# Patient Record
Sex: Female | Born: 1950
Health system: Southern US, Community
[De-identification: ages and names within clinical notes are randomized; demographics above are authoritative.]

## PROBLEM LIST (undated history)

## (undated) DIAGNOSIS — M7500 Adhesive capsulitis of unspecified shoulder: Secondary | ICD-10-CM

## (undated) DIAGNOSIS — Z95810 Presence of automatic (implantable) cardiac defibrillator: Secondary | ICD-10-CM

## (undated) DIAGNOSIS — R011 Cardiac murmur, unspecified: Secondary | ICD-10-CM

## (undated) DIAGNOSIS — F32A Depression, unspecified: Secondary | ICD-10-CM

## (undated) DIAGNOSIS — Z9889 Other specified postprocedural states: Secondary | ICD-10-CM

## (undated) DIAGNOSIS — F419 Anxiety disorder, unspecified: Secondary | ICD-10-CM

## (undated) DIAGNOSIS — J449 Chronic obstructive pulmonary disease, unspecified: Secondary | ICD-10-CM

## (undated) DIAGNOSIS — Z8679 Personal history of other diseases of the circulatory system: Secondary | ICD-10-CM

## (undated) DIAGNOSIS — I4892 Unspecified atrial flutter: Secondary | ICD-10-CM

## (undated) DIAGNOSIS — I4891 Unspecified atrial fibrillation: Secondary | ICD-10-CM

## (undated) DIAGNOSIS — T7840XA Allergy, unspecified, initial encounter: Secondary | ICD-10-CM

## (undated) DIAGNOSIS — K219 Gastro-esophageal reflux disease without esophagitis: Secondary | ICD-10-CM

## (undated) DIAGNOSIS — E7800 Pure hypercholesterolemia, unspecified: Secondary | ICD-10-CM

## (undated) DIAGNOSIS — J45909 Unspecified asthma, uncomplicated: Secondary | ICD-10-CM

## (undated) DIAGNOSIS — M1990 Unspecified osteoarthritis, unspecified site: Secondary | ICD-10-CM

## (undated) DIAGNOSIS — H409 Unspecified glaucoma: Secondary | ICD-10-CM

## (undated) DIAGNOSIS — H269 Unspecified cataract: Secondary | ICD-10-CM

## (undated) DIAGNOSIS — F329 Major depressive disorder, single episode, unspecified: Secondary | ICD-10-CM

## (undated) DIAGNOSIS — U071 COVID-19: Secondary | ICD-10-CM

## (undated) DIAGNOSIS — R112 Nausea with vomiting, unspecified: Secondary | ICD-10-CM

## (undated) HISTORY — DX: Allergy, unspecified, initial encounter: T78.40XA

## (undated) HISTORY — DX: Major depressive disorder, single episode, unspecified: F32.9

## (undated) HISTORY — PX: COSMETIC SURGERY: SHX468

## (undated) HISTORY — PX: PERIPHERAL VASCULAR INTERVENTION: CATH118257

## (undated) HISTORY — DX: Unspecified cataract: H26.9

## (undated) HISTORY — DX: Unspecified osteoarthritis, unspecified site: M19.90

## (undated) HISTORY — DX: Cardiac murmur, unspecified: R01.1

## (undated) HISTORY — DX: Personal history of other diseases of the circulatory system: Z86.79

## (undated) HISTORY — PX: JOINT REPLACEMENT: SHX530

## (undated) HISTORY — PX: CATARACT EXTRACTION, BILATERAL: SHX1313

## (undated) HISTORY — DX: Unspecified glaucoma: H40.9

## (undated) HISTORY — DX: Adhesive capsulitis of unspecified shoulder: M75.00

## (undated) HISTORY — PX: EYE SURGERY: SHX253

## (undated) HISTORY — PX: KNEE SURGERY: SHX244

## (undated) HISTORY — PX: KNEE ARTHROSCOPY: SHX127

## (undated) HISTORY — DX: Unspecified atrial fibrillation: I48.91

## (undated) HISTORY — DX: Chronic obstructive pulmonary disease, unspecified: J44.9

## (undated) HISTORY — DX: Unspecified atrial flutter: I48.92

## (undated) HISTORY — DX: COVID-19: U07.1

## (undated) HISTORY — PX: TONSILLECTOMY: SUR1361

## (undated) HISTORY — DX: Depression, unspecified: F32.A

---
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
## 1988-02-04 HISTORY — PX: ABDOMINAL HYSTERECTOMY: SHX81

## 1988-02-04 HISTORY — PX: APPENDECTOMY: SHX54

## 1998-01-23 ENCOUNTER — Ambulatory Visit (HOSPITAL_BASED_OUTPATIENT_CLINIC_OR_DEPARTMENT_OTHER): Admission: RE | Admit: 1998-01-23 | Discharge: 1998-01-23 | Payer: Self-pay | Admitting: Orthopedic Surgery

## 1998-02-22 ENCOUNTER — Ambulatory Visit (HOSPITAL_COMMUNITY): Admission: RE | Admit: 1998-02-22 | Discharge: 1998-02-22 | Payer: Self-pay | Admitting: Family Medicine

## 1999-01-31 ENCOUNTER — Emergency Department (HOSPITAL_COMMUNITY): Admission: EM | Admit: 1999-01-31 | Discharge: 1999-01-31 | Payer: Self-pay | Admitting: Emergency Medicine

## 1999-05-01 ENCOUNTER — Ambulatory Visit (HOSPITAL_COMMUNITY): Admission: RE | Admit: 1999-05-01 | Discharge: 1999-05-01 | Payer: Self-pay | Admitting: Obstetrics and Gynecology

## 1999-05-01 ENCOUNTER — Encounter: Payer: Self-pay | Admitting: Obstetrics and Gynecology

## 2000-05-05 ENCOUNTER — Ambulatory Visit (HOSPITAL_COMMUNITY): Admission: RE | Admit: 2000-05-05 | Discharge: 2000-05-05 | Payer: Self-pay | Admitting: Family Medicine

## 2000-05-05 ENCOUNTER — Encounter: Payer: Self-pay | Admitting: Family Medicine

## 2000-08-27 ENCOUNTER — Emergency Department (HOSPITAL_COMMUNITY): Admission: EM | Admit: 2000-08-27 | Discharge: 2000-08-27 | Payer: Self-pay | Admitting: Emergency Medicine

## 2001-03-22 ENCOUNTER — Emergency Department (HOSPITAL_COMMUNITY): Admission: EM | Admit: 2001-03-22 | Discharge: 2001-03-22 | Payer: Self-pay | Admitting: Emergency Medicine

## 2001-03-24 ENCOUNTER — Ambulatory Visit (HOSPITAL_COMMUNITY): Admission: RE | Admit: 2001-03-24 | Discharge: 2001-03-24 | Payer: Self-pay | Admitting: Family Medicine

## 2001-03-24 ENCOUNTER — Encounter: Payer: Self-pay | Admitting: Family Medicine

## 2001-05-26 ENCOUNTER — Encounter: Payer: Self-pay | Admitting: Obstetrics and Gynecology

## 2001-05-26 ENCOUNTER — Ambulatory Visit (HOSPITAL_COMMUNITY): Admission: RE | Admit: 2001-05-26 | Discharge: 2001-05-26 | Payer: Self-pay | Admitting: Obstetrics and Gynecology

## 2001-07-09 ENCOUNTER — Other Ambulatory Visit: Admission: RE | Admit: 2001-07-09 | Discharge: 2001-07-09 | Payer: Self-pay | Admitting: Obstetrics and Gynecology

## 2002-09-22 ENCOUNTER — Ambulatory Visit (HOSPITAL_COMMUNITY): Admission: RE | Admit: 2002-09-22 | Discharge: 2002-09-22 | Payer: Self-pay | Admitting: Gastroenterology

## 2002-11-22 ENCOUNTER — Emergency Department (HOSPITAL_COMMUNITY): Admission: EM | Admit: 2002-11-22 | Discharge: 2002-11-22 | Payer: Self-pay | Admitting: Emergency Medicine

## 2002-12-20 ENCOUNTER — Ambulatory Visit (HOSPITAL_COMMUNITY): Admission: RE | Admit: 2002-12-20 | Discharge: 2002-12-20 | Payer: Self-pay | Admitting: Obstetrics and Gynecology

## 2003-06-02 ENCOUNTER — Ambulatory Visit (HOSPITAL_COMMUNITY): Admission: RE | Admit: 2003-06-02 | Discharge: 2003-06-02 | Payer: Self-pay | Admitting: Orthopedic Surgery

## 2003-07-17 ENCOUNTER — Ambulatory Visit (HOSPITAL_COMMUNITY): Admission: RE | Admit: 2003-07-17 | Discharge: 2003-07-17 | Payer: Self-pay | Admitting: Orthopedic Surgery

## 2003-07-17 ENCOUNTER — Ambulatory Visit: Admission: RE | Admit: 2003-07-17 | Discharge: 2003-07-17 | Payer: Self-pay | Admitting: Orthopedic Surgery

## 2003-07-17 ENCOUNTER — Ambulatory Visit (HOSPITAL_BASED_OUTPATIENT_CLINIC_OR_DEPARTMENT_OTHER): Admission: RE | Admit: 2003-07-17 | Discharge: 2003-07-17 | Payer: Self-pay | Admitting: Orthopedic Surgery

## 2004-01-09 ENCOUNTER — Ambulatory Visit (HOSPITAL_COMMUNITY): Admission: RE | Admit: 2004-01-09 | Discharge: 2004-01-09 | Payer: Self-pay | Admitting: Obstetrics and Gynecology

## 2004-09-16 ENCOUNTER — Ambulatory Visit (HOSPITAL_COMMUNITY): Admission: RE | Admit: 2004-09-16 | Discharge: 2004-09-16 | Payer: Self-pay | Admitting: Orthopedic Surgery

## 2004-10-13 ENCOUNTER — Emergency Department (HOSPITAL_COMMUNITY): Admission: EM | Admit: 2004-10-13 | Discharge: 2004-10-13 | Payer: Self-pay | Admitting: Emergency Medicine

## 2004-10-21 ENCOUNTER — Ambulatory Visit (HOSPITAL_COMMUNITY): Admission: RE | Admit: 2004-10-21 | Discharge: 2004-10-21 | Payer: Self-pay | Admitting: Orthopedic Surgery

## 2004-10-21 ENCOUNTER — Ambulatory Visit (HOSPITAL_BASED_OUTPATIENT_CLINIC_OR_DEPARTMENT_OTHER): Admission: RE | Admit: 2004-10-21 | Discharge: 2004-10-21 | Payer: Self-pay | Admitting: Orthopedic Surgery

## 2004-10-31 ENCOUNTER — Encounter: Admission: RE | Admit: 2004-10-31 | Discharge: 2004-12-06 | Payer: Self-pay | Admitting: Orthopedic Surgery

## 2004-11-23 ENCOUNTER — Emergency Department (HOSPITAL_COMMUNITY): Admission: EM | Admit: 2004-11-23 | Discharge: 2004-11-23 | Payer: Self-pay | Admitting: Family Medicine

## 2005-03-05 ENCOUNTER — Ambulatory Visit (HOSPITAL_COMMUNITY): Admission: RE | Admit: 2005-03-05 | Discharge: 2005-03-05 | Payer: Self-pay | Admitting: Obstetrics and Gynecology

## 2005-06-21 ENCOUNTER — Emergency Department (HOSPITAL_COMMUNITY): Admission: EM | Admit: 2005-06-21 | Discharge: 2005-06-21 | Payer: Self-pay | Admitting: Family Medicine

## 2006-01-19 ENCOUNTER — Emergency Department (HOSPITAL_COMMUNITY): Admission: EM | Admit: 2006-01-19 | Discharge: 2006-01-19 | Payer: Self-pay | Admitting: Family Medicine

## 2006-03-19 ENCOUNTER — Ambulatory Visit (HOSPITAL_COMMUNITY): Admission: RE | Admit: 2006-03-19 | Discharge: 2006-03-19 | Payer: Self-pay | Admitting: Obstetrics and Gynecology

## 2006-06-28 ENCOUNTER — Emergency Department (HOSPITAL_COMMUNITY): Admission: EM | Admit: 2006-06-28 | Discharge: 2006-06-28 | Payer: Self-pay | Admitting: Family Medicine

## 2007-02-04 HISTORY — PX: TOOTH EXTRACTION: SUR596

## 2007-04-05 ENCOUNTER — Ambulatory Visit (HOSPITAL_COMMUNITY): Admission: RE | Admit: 2007-04-05 | Discharge: 2007-04-05 | Payer: Self-pay | Admitting: Obstetrics and Gynecology

## 2007-05-05 ENCOUNTER — Emergency Department (HOSPITAL_COMMUNITY): Admission: EM | Admit: 2007-05-05 | Discharge: 2007-05-05 | Payer: Self-pay | Admitting: Emergency Medicine

## 2008-02-04 DIAGNOSIS — I499 Cardiac arrhythmia, unspecified: Secondary | ICD-10-CM

## 2008-02-04 HISTORY — DX: Cardiac arrhythmia, unspecified: I49.9

## 2008-04-05 ENCOUNTER — Ambulatory Visit (HOSPITAL_COMMUNITY): Admission: RE | Admit: 2008-04-05 | Discharge: 2008-04-05 | Payer: Self-pay | Admitting: Obstetrics and Gynecology

## 2008-11-07 ENCOUNTER — Encounter: Payer: Self-pay | Admitting: Internal Medicine

## 2008-11-14 ENCOUNTER — Encounter: Payer: Self-pay | Admitting: Internal Medicine

## 2008-12-15 ENCOUNTER — Emergency Department (HOSPITAL_COMMUNITY): Admission: EM | Admit: 2008-12-15 | Discharge: 2008-12-15 | Payer: Self-pay | Admitting: Emergency Medicine

## 2008-12-18 ENCOUNTER — Encounter: Payer: Self-pay | Admitting: Internal Medicine

## 2009-01-02 ENCOUNTER — Encounter (HOSPITAL_COMMUNITY): Admission: RE | Admit: 2009-01-02 | Discharge: 2009-02-01 | Payer: Self-pay | Admitting: Interventional Cardiology

## 2009-01-31 ENCOUNTER — Encounter: Payer: Self-pay | Admitting: Internal Medicine

## 2009-02-01 DIAGNOSIS — I4891 Unspecified atrial fibrillation: Secondary | ICD-10-CM | POA: Insufficient documentation

## 2009-02-01 DIAGNOSIS — J449 Chronic obstructive pulmonary disease, unspecified: Secondary | ICD-10-CM

## 2009-02-01 DIAGNOSIS — J45909 Unspecified asthma, uncomplicated: Secondary | ICD-10-CM

## 2009-02-01 DIAGNOSIS — G43909 Migraine, unspecified, not intractable, without status migrainosus: Secondary | ICD-10-CM

## 2009-02-01 DIAGNOSIS — I495 Sick sinus syndrome: Secondary | ICD-10-CM

## 2009-02-01 DIAGNOSIS — F325 Major depressive disorder, single episode, in full remission: Secondary | ICD-10-CM

## 2009-02-05 ENCOUNTER — Ambulatory Visit: Payer: Self-pay | Admitting: Internal Medicine

## 2009-02-07 ENCOUNTER — Encounter: Payer: Self-pay | Admitting: Internal Medicine

## 2009-02-09 ENCOUNTER — Encounter: Payer: Self-pay | Admitting: Internal Medicine

## 2009-02-20 ENCOUNTER — Encounter: Payer: Self-pay | Admitting: Internal Medicine

## 2009-02-26 ENCOUNTER — Ambulatory Visit: Payer: Self-pay | Admitting: Cardiology

## 2009-02-26 LAB — CONVERTED CEMR LAB: POC INR: 1.9

## 2009-03-02 ENCOUNTER — Ambulatory Visit: Payer: Self-pay | Admitting: Internal Medicine

## 2009-03-09 ENCOUNTER — Ambulatory Visit: Payer: Self-pay | Admitting: Cardiovascular Disease

## 2009-03-12 ENCOUNTER — Ambulatory Visit (HOSPITAL_COMMUNITY): Admission: RE | Admit: 2009-03-12 | Discharge: 2009-03-12 | Payer: Self-pay | Admitting: Internal Medicine

## 2009-03-12 ENCOUNTER — Encounter (INDEPENDENT_AMBULATORY_CARE_PROVIDER_SITE_OTHER): Payer: Self-pay | Admitting: Cardiology

## 2009-03-13 ENCOUNTER — Ambulatory Visit: Payer: Self-pay | Admitting: Internal Medicine

## 2009-03-13 ENCOUNTER — Ambulatory Visit (HOSPITAL_COMMUNITY): Admission: RE | Admit: 2009-03-13 | Discharge: 2009-03-14 | Payer: Self-pay | Admitting: Internal Medicine

## 2009-03-16 ENCOUNTER — Encounter (INDEPENDENT_AMBULATORY_CARE_PROVIDER_SITE_OTHER): Payer: Self-pay | Admitting: Cardiology

## 2009-03-16 ENCOUNTER — Ambulatory Visit: Payer: Self-pay | Admitting: Internal Medicine

## 2009-03-16 LAB — CONVERTED CEMR LAB: POC INR: 3.2

## 2009-03-17 ENCOUNTER — Encounter: Payer: Self-pay | Admitting: Internal Medicine

## 2009-03-30 ENCOUNTER — Ambulatory Visit: Payer: Self-pay | Admitting: Internal Medicine

## 2009-03-30 LAB — CONVERTED CEMR LAB: POC INR: 3.2

## 2009-04-03 HISTORY — PX: OTHER SURGICAL HISTORY: SHX169

## 2009-04-12 ENCOUNTER — Encounter: Payer: Self-pay | Admitting: Emergency Medicine

## 2009-04-12 ENCOUNTER — Ambulatory Visit: Payer: Self-pay | Admitting: Internal Medicine

## 2009-04-12 ENCOUNTER — Inpatient Hospital Stay (HOSPITAL_COMMUNITY): Admission: EM | Admit: 2009-04-12 | Discharge: 2009-04-17 | Payer: Self-pay | Admitting: Interventional Cardiology

## 2009-04-20 ENCOUNTER — Ambulatory Visit: Payer: Self-pay | Admitting: Cardiology

## 2009-04-26 ENCOUNTER — Telehealth: Payer: Self-pay | Admitting: Internal Medicine

## 2009-04-26 ENCOUNTER — Encounter: Payer: Self-pay | Admitting: Internal Medicine

## 2009-04-27 ENCOUNTER — Ambulatory Visit: Payer: Self-pay | Admitting: Internal Medicine

## 2009-04-27 ENCOUNTER — Ambulatory Visit: Payer: Self-pay | Admitting: Cardiology

## 2009-05-10 ENCOUNTER — Ambulatory Visit: Payer: Self-pay | Admitting: Cardiovascular Disease

## 2009-05-10 LAB — CONVERTED CEMR LAB: POC INR: 2.5

## 2009-05-14 ENCOUNTER — Ambulatory Visit (HOSPITAL_COMMUNITY): Admission: RE | Admit: 2009-05-14 | Discharge: 2009-05-14 | Payer: Self-pay | Admitting: Obstetrics and Gynecology

## 2009-05-23 ENCOUNTER — Ambulatory Visit: Payer: Self-pay | Admitting: Cardiology

## 2009-05-23 LAB — CONVERTED CEMR LAB: POC INR: 2.8

## 2009-05-30 ENCOUNTER — Ambulatory Visit: Payer: Self-pay | Admitting: Internal Medicine

## 2009-05-31 LAB — CONVERTED CEMR LAB
ALT: 17 units/L (ref 0–35)
AST: 16 units/L (ref 0–37)
Albumin: 4.1 g/dL (ref 3.5–5.2)
Alkaline Phosphatase: 89 units/L (ref 39–117)
Bilirubin, Direct: 0 mg/dL (ref 0.0–0.3)
Total Bilirubin: 0.5 mg/dL (ref 0.3–1.2)
Total Protein: 6.3 g/dL (ref 6.0–8.3)

## 2009-06-26 ENCOUNTER — Encounter: Payer: Self-pay | Admitting: Internal Medicine

## 2009-06-27 ENCOUNTER — Encounter: Payer: Self-pay | Admitting: Internal Medicine

## 2009-06-27 ENCOUNTER — Ambulatory Visit: Payer: Self-pay | Admitting: Internal Medicine

## 2009-06-27 LAB — CONVERTED CEMR LAB
Calcium: 9.7 mg/dL (ref 8.4–10.5)
Creatinine, Ser: 0.8 mg/dL (ref 0.4–1.2)
Glucose, Bld: 90 mg/dL (ref 70–99)

## 2009-06-29 ENCOUNTER — Encounter: Payer: Self-pay | Admitting: Internal Medicine

## 2009-07-04 ENCOUNTER — Ambulatory Visit: Payer: Self-pay | Admitting: Internal Medicine

## 2009-07-04 ENCOUNTER — Ambulatory Visit: Payer: Self-pay | Admitting: Cardiology

## 2009-07-04 LAB — CONVERTED CEMR LAB: POC INR: 3.2

## 2009-07-05 LAB — CONVERTED CEMR LAB
CO2: 29 meq/L (ref 19–32)
Creatinine, Ser: 0.8 mg/dL (ref 0.4–1.2)
Eosinophils Absolute: 0.1 10*3/uL (ref 0.0–0.7)
HCT: 39.7 % (ref 36.0–46.0)
Hemoglobin: 13.7 g/dL (ref 12.0–15.0)
Lymphocytes Relative: 22.7 % (ref 12.0–46.0)
Lymphs Abs: 1.6 10*3/uL (ref 0.7–4.0)
MCHC: 34.5 g/dL (ref 30.0–36.0)
Monocytes Absolute: 0.6 10*3/uL (ref 0.1–1.0)
Monocytes Relative: 8 % (ref 3.0–12.0)
Neutro Abs: 4.6 10*3/uL (ref 1.4–7.7)
Potassium: 4.1 meq/L (ref 3.5–5.1)
RDW: 14.2 % (ref 11.5–14.6)
Sodium: 141 meq/L (ref 135–145)
WBC: 6.9 10*3/uL (ref 4.5–10.5)
aPTT: 37.1 s — ABNORMAL HIGH (ref 21.7–28.8)

## 2009-07-10 ENCOUNTER — Ambulatory Visit: Payer: Self-pay | Admitting: Cardiovascular Disease

## 2009-07-10 ENCOUNTER — Ambulatory Visit (HOSPITAL_COMMUNITY): Admission: RE | Admit: 2009-07-10 | Discharge: 2009-07-10 | Payer: Self-pay | Admitting: Internal Medicine

## 2009-07-12 ENCOUNTER — Ambulatory Visit: Payer: Self-pay | Admitting: Cardiovascular Disease

## 2009-07-12 ENCOUNTER — Encounter (INDEPENDENT_AMBULATORY_CARE_PROVIDER_SITE_OTHER): Payer: Self-pay | Admitting: Cardiology

## 2009-07-12 ENCOUNTER — Ambulatory Visit (HOSPITAL_COMMUNITY): Admission: RE | Admit: 2009-07-12 | Discharge: 2009-07-12 | Payer: Self-pay | Admitting: Cardiology

## 2009-07-12 LAB — CONVERTED CEMR LAB: POC INR: 2.1

## 2009-07-13 ENCOUNTER — Inpatient Hospital Stay (HOSPITAL_COMMUNITY): Admission: RE | Admit: 2009-07-13 | Discharge: 2009-07-14 | Payer: Self-pay | Admitting: Internal Medicine

## 2009-07-13 ENCOUNTER — Ambulatory Visit: Payer: Self-pay | Admitting: Internal Medicine

## 2009-07-14 ENCOUNTER — Encounter: Payer: Self-pay | Admitting: Internal Medicine

## 2009-07-19 ENCOUNTER — Ambulatory Visit: Payer: Self-pay | Admitting: Cardiology

## 2009-08-15 ENCOUNTER — Ambulatory Visit: Payer: Self-pay | Admitting: Internal Medicine

## 2009-09-12 ENCOUNTER — Ambulatory Visit: Payer: Self-pay | Admitting: Cardiology

## 2009-09-12 LAB — CONVERTED CEMR LAB: POC INR: 1.4

## 2009-09-21 ENCOUNTER — Ambulatory Visit: Payer: Self-pay | Admitting: Cardiology

## 2009-09-21 LAB — CONVERTED CEMR LAB: POC INR: 2.3

## 2009-10-10 ENCOUNTER — Ambulatory Visit: Payer: Self-pay | Admitting: Cardiovascular Disease

## 2009-10-10 ENCOUNTER — Ambulatory Visit: Payer: Self-pay | Admitting: Internal Medicine

## 2010-03-06 ENCOUNTER — Telehealth: Payer: Self-pay | Admitting: Internal Medicine

## 2010-03-07 NOTE — Medication Information (Signed)
Summary: rov/ewj  Anticoagulant Therapy  Managed by: Inactive Referring MD: Dr Johney Frame PCP: Catha Gosselin, MD Supervising MD: Johney Frame MD, Fayrene Fearing Indication 1: Atrial Fibrillation Lab Used: [facility] [place] Site: [address] INR RANGE 2.0-3.0          Comments: Saw Dr Johney Frame today and he discontinued coumadin  Allergies: 1)  ! Erythromycin 2)  ! Cipro  Anticoagulation Management History:      Negative risk factors for bleeding include an age less than 4 years old.  The bleeding index is 'low risk'.  Negative CHADS2 values include Age > 74 years old.  Anticoagulation responsible provider: Allred MD, Fayrene Fearing.  Exp: 11/2010.    Anticoagulation Management Assessment/Plan:      The patient's current anticoagulation dose is Warfarin sodium 5 mg tabs: UAD.  The target INR is 2.0-3.0.  The next INR is due 10/10/2009.  Anticoagulation instructions were given to patient.  Results were reviewed/authorized by Inactive.         Prior Anticoagulation Instructions: INR 2.3  Continue on same dosage 1 tablet daily except 1/2 tablet on Tuesdays.  Recheck in 2 weeks.

## 2010-03-07 NOTE — Medication Information (Signed)
Summary: rov/ewj  Anticoagulant Therapy  Managed by: Cloyde Reams, RN, BSN Referring MD: Dr Johney Frame PCP: Catha Gosselin, MD Supervising MD: Eden Emms MD, Theron Arista Indication 1: Atrial Fibrillation Lab Used: [facility] [place] Site: [address] INR POC 2.7 INR RANGE 2.0-3.0  Dietary changes: no    Health status changes: no    Bleeding/hemorrhagic complications: no    Recent/future hospitalizations: no    Any changes in medication regimen? no    Recent/future dental: no  Any missed doses?: no       Is patient compliant with meds? yes       Allergies (verified): 1)  ! * Eyrthomicin 2)  ! Cipro  Anticoagulation Management History:      The patient is taking warfarin and comes in today for a routine follow up visit.  Negative risk factors for bleeding include an age less than 4 years old.  The bleeding index is 'low risk'.  Negative CHADS2 values include Age > 27 years old.  Anticoagulation responsible provider: Eden Emms MD, Theron Arista.  INR POC: 2.7.  Cuvette Lot#: [id number].  Exp: 05/2010.    Anticoagulation Management Assessment/Plan:      The patient's current anticoagulation dose is Warfarin sodium 5 mg tabs: one by mouth daily.  The target INR is 2.0-3.0.  The next INR is due 03/30/2009.  Results were reviewed/authorized by Cloyde Reams, RN, BSN.  She was notified by Cloyde Reams RN.         Prior Anticoagulation Instructions: INR 2.0  Take 10mg  today then increase dosage to 7.5mg  daily except 5mg  on Thursdays.  Recheck in 1 week.    Current Anticoagulation Instructions: INR 2.7  Continue on same dosage 1.5 tablets daily except 1 tablet on Thursdays.  Recheck in 1 week.

## 2010-03-07 NOTE — Assessment & Plan Note (Signed)
Summary: per check out/sf   Visit Type:  Follow-up Referring Provider:  Garnette Scheuermann, MD Primary Provider:  Catha Gosselin, MD   History of Present Illness: Ms Liddicoat presents today for electrophysiology followup.  She continues to have episodes of symptomatic afib on a dialy basis, despite amiodarone.  She denies significant elevation in heart rates but reports associated SOB.  She denies any further symptomatic post termination pauses.  The patient denies symptoms of chest pain, orthopnea, PND, lower extremity edema,  presyncope, syncope or neurologic sequela. The patient is tolerating medications without difficulties and is otherwise without complaint today.   Current Medications (verified): 1)  Wellbutrin Xl 300 Mg Xr24h-Tab (Bupropion Hcl) .... Take One Tablet By Mouth Once Daily. 2)  Sertraline Hcl 100 Mg Tabs (Sertraline Hcl) .Marland Kitchen.. 1 By Mouth Daily 3)  Imitrex 100 Mg Tabs (Sumatriptan Succinate) .... Uad 4)  Fioricet 50-325-40 Mg Tabs (Butalbital-Apap-Caffeine) .... Uad As Needed 5)  Estrace 0.1 Mg (Estradiol) .... Uad 6)  Promethazine Hcl 25 Mg Tabs (Promethazine Hcl) .Marland Kitchen.. 1-2 Tablets As Needed 7)  Xanax 0.25 Mg Tabs (Alprazolam) .Marland Kitchen.. 1 Tablet As Needed 8)  Aspirin Ec 325 Mg Tbec (Aspirin) .... Take One Tablet By Mouth Daily As Needed 9)  Amiodarone Hcl 200 Mg Tabs (Amiodarone Hcl) .... Take One Tablet By Mouth Daily 10)  Warfarin Sodium 5 Mg Tabs (Warfarin Sodium) .... Uad 11)  Cardizem Cd 240 Mg Xr24h-Cap (Diltiazem Hcl Coated Beads) .... Take One Tablet By Mouth Once Daily.  Allergies (verified): 1)  ! Erythromycin 2)  ! Cipro  Past History:  Past Medical History: Reviewed history from 04/27/2009 and no changes required. ATRIAL FIBRILLATION and TYPICAL APPEARING ATRIAL FLUTTER with post-termination pauses s/p afib ablation 2/11 and CTI ablation 3/11 Rheumatic fever 1957 COPD  DEPRESSION  MIGRAINE HEADACHE   Past Surgical History: Reviewed history from 02/05/2009 and  no changes required. Knee surgery 1960, 1963, 1999, 2006 TAH 1990 Rhinoplasty age 20  Family History: Reviewed history from 02/05/2009 and no changes required. Mother had renal cell cancer Father had dementia  Social History: Reviewed history from 02/05/2009 and no changes required. 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  Husband is a Engineer, civil (consulting) on 3100 unit at [employer]. Married.  No children. Tobacco Use - Yes. < 1PPD presently x 40 years Alcohol Use - 1-2 glasses of wine per year Regular Exercise - yes  Review of Systems       All systems are reviewed and negative except as listed in the HPI.   Vital Signs:  Patient profile:   60 year old female Height:      67 inches Weight:      160 pounds BMI:     25.15 Pulse rate:   60 / minute BP sitting:   130 / 80  (left arm)  Vitals Entered By: Laurance Flatten CMA (Jun 27, 2009 10:22 AM)  Physical Exam  General:  Well developed, well nourished, in no acute distress. Head:  normocephalic and atraumatic Eyes:  PERRLA/EOM intact; conjunctiva and lids normal. Nose:  no deformity, discharge, inflammation, or lesions Mouth:  Teeth, gums and palate normal. Oral mucosa normal. Neck:  Neck supple, no JVD. No masses, thyromegaly or abnormal cervical nodes. Lungs:  Clear bilaterally to auscultation and percussion. Heart:  RRR, no m/r/g Abdomen:  Bowel sounds positive; abdomen soft and non-tender without masses, organomegaly, or hernias noted. No hepatosplenomegaly. Msk:  Back normal, normal gait. Muscle strength and tone normal. Pulses:  pulses  normal in all 4 extremities Extremities:  No clubbing or cyanosis. Neurologic:  Alert and oriented x 3. Skin:  Intact without lesions or rashes. Cervical Nodes:  no significant adenopathy Psych:  Normal affect.   EKG  Procedure date:  06/27/2009  Findings:      sinus rhythm 60 bpm, PR 152, QT 388, otherwise normal ekg  Impression & Recommendations:  Problem # 1:  ATRIAL  FIBRILLATION (ICD-427.31) The patient continues to have symptomatic afib.  She has failed medical therapy with amiodarone and flecainide. Therapeutic strategies for afib including medicine and ablation were discussed in detail with the patient today. Risk, benefits, and alternatives to EP study and radiofrequency ablation for afib were also discussed in detail today. These risks include but are not limited to stroke, bleeding, vascular damage, tamponade, perforation, damage to the esophagus, lungs, and other structures, pulmonary vein stenosis, worsening renal function, and death. The patient understands these risk and wishes to proceed.  We will schedule catheter ablation at the next available time. She will need a cardiac CT to evaluate for pulmonary vein stenosis prior to the procedure, as well as a TEE the day before.  Problem # 2:  SINOATRIAL NODE DYSFUNCTION (ICD-427.81)  No further presyncope or post-termination pauses  Orders: TLB-BMP (Basic Metabolic Panel-BMET) (80048-METABOL)  Other Orders: EKG w/ Interpretation (93000) Cardiac CTA (Cardiac CTA)  Patient Instructions: 1)  Your physician has recommended that you have an ablation.  Catheter ablation is a medical procedure used to treat some cardiac arrhythmias (irregular heartbeats). During catheter ablation, a long, thin, flexible tube is put into a blood vessel in your groin (upper thigh), or neck. This tube is called an ablation catheter. It is then guided to your heart through the blood vessel. Radiofrequency waves destroy small areas of heart tissue where abnormal heartbeats may cause an arrhythmia to start.  Please see the instruction sheet given to you today. 2)  Your physician has requested that you have a cardiac CT.  Cardiac computed tomography (CT) is a painless test that uses an x-ray machine to take clear, detailed pictures of your heart.  For further information please visit [email or web address]  Please follow instruction  sheet as given. NEXT WEEK  3)  Your physician recommends that you return for lab work on 07/04/09 BMP/cbc/pt/ptt  427.31

## 2010-03-07 NOTE — Medication Information (Signed)
Summary: Suzanne [name]  Anticoagulant Therapy  Managed by: Eda Keys, PharmD Referring MD: Dr Johney Frame PCP: Catha Gosselin, MD Supervising MD: Gala Romney MD, Reuel Boom Indication 1: Atrial Fibrillation Lab Used: [facility] [place] Site: [address] INR POC 3.2 INR RANGE 2.0-3.0  Dietary changes: no    Health status changes: yes       Details: recent case of GI illness  Bleeding/hemorrhagic complications: no    Recent/future hospitalizations: no    Any changes in medication regimen? no    Recent/future dental: no  Any missed doses?: yes     Details: possibly missed two doses due to vomitting  Is patient compliant with meds? yes       Allergies: 1)  ! Erythromycin 2)  ! Cipro  Anticoagulation Management History:      The patient is taking warfarin and comes in today for a routine follow up visit.  Negative risk factors for bleeding include an age less than 39 years old.  The bleeding index is 'low risk'.  Negative CHADS2 values include Age > 69 years old.  Anticoagulation responsible provider: Bensimhon MD, Reuel Boom.  INR POC: 3.2.  Cuvette Lot#: [id number].  Exp: 05/2010.    Anticoagulation Management Assessment/Plan:      The patient's current anticoagulation dose is Warfarin sodium 5 mg tabs: one by mouth daily.  The target INR is 2.0-3.0.  The next INR is due 04/13/2009.  Anticoagulation instructions were given to patient.  Results were reviewed/authorized by Eda Keys, PharmD.  She was notified by Eda Keys.         Prior Anticoagulation Instructions: INR 3.2  Continue 1.5 tabs daily except 1 tab each Thursday.  Recheck in 2 weeks.    Current Anticoagulation Instructions: INR 3.2  Take 1 tablet tonight.  Start NEW dosing schedule of 1 tablet on Tuesday and Thursday and 1.5 tablets all other days.  Return to clinic in 2 weeks.

## 2010-03-07 NOTE — Letter (Signed)
Summary: Handout Printed  Printed Handout:  - Coumadin Instructions-w/out Meds 

## 2010-03-07 NOTE — Progress Notes (Signed)
Summary: c/o fluttering again/ EKG w/b done @[facility]   Phone Note Call from Patient Call back at Home Phone [phone] Call back at [phone]   Caller: Patient Reason for Call: Talk to Nurse Summary of Call: faxed over monitor strips, c/o fluttering again.  Initial call taken by: Lorne Skeens,  April 26, 2009 10:07 AM  Follow-up for Phone Call        spoke with pt, she is having episodes of atrial flutter again. she is going in and out of atrial flutter and her heart rate is varing from 120 to 40. 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 she feels okay until she pauses after the flutter stops and then she feels SOB, and faint. will find monitor strips. Deliah Goody, RN  April 26, 2009 10:33 AM  monitor strips show atrial flutter with post termination pauses. discussed with dr Johney Frame, pt to come by the office for 12 lead EKG. she will increase the amiodarone to three times daily. strips and EKG will be shown to dr Oakley Orban tomorrow. Deliah Goody, RN  April 26, 2009 10:47 AM   Additional Follow-up for Phone Call Additional follow up Details #1::        PT WANT ORDER FAXED TO [facility] AT [phone] TO HAVE EKG DONE THERE Judie Grieve  April 26, 2009 11:38 AM Order faxed to [facility]. Fax # [phone].To fax it back to [phone]. Pt aware. Ollen Gross, RN, BSN  April 26, 2009 12:43 PM     Additional Follow-up for Phone Call Additional follow up Details #2::    Will need 12 lead ekg and to review strips to see if this is typical flutter, atypical flutter, or afib. I have increased amiodarone as above. She should come to the office for further evaluation.

## 2010-03-07 NOTE — Medication Information (Signed)
Summary: rov/tm  Anticoagulant Therapy  Managed by: Cloyde Reams, RN, BSN Referring MD: Dr Johney Frame PCP: Catha Gosselin, MD Supervising MD: Tenny Craw MD, Gunnar Fusi Indication 1: Atrial Fibrillation Lab Used: [facility] [place] Site: [address] INR POC 2.2 INR RANGE 2.0-3.0  Dietary changes: no    Health status changes: no    Bleeding/hemorrhagic complications: no    Recent/future hospitalizations: no    Any changes in medication regimen? yes       Details: Started on Amiodarone 04/16/09, 200mg  x 2 daily.  Now incr to 200mg  tid.  Stopped Fleccanide started Cardizem 240mg  qd.    Recent/future dental: no  Any missed doses?: no       Is patient compliant with meds? yes       Allergies: 1)  ! Erythromycin 2)  ! Cipro  Anticoagulation Management History:      The patient is taking warfarin and comes in today for a routine follow up visit.  Negative risk factors for bleeding include an age less than 70 years old.  The bleeding index is 'low risk'.  Negative CHADS2 values include Age > 53 years old.  Anticoagulation responsible provider: Tenny Craw MD, Gunnar Fusi.  INR POC: 2.2.  Cuvette Lot#: [id number].  Exp: 06/2010.    Anticoagulation Management Assessment/Plan:      The patient's current anticoagulation dose is Warfarin sodium 5 mg tabs: one by mouth daily.  The target INR is 2.0-3.0.  The next INR is due 05/10/2009.  Anticoagulation instructions were given to patient.  Results were reviewed/authorized by Cloyde Reams, RN, BSN.  She was notified by Cloyde Reams RN.         Prior Anticoagulation Instructions: INR 2.3 Change dose to 5mg s daily except 2.5mg s on Tuesdays and Saturdays. Recheck in one week.   Current Anticoagulation Instructions: INR 2.2  Continue on same dosage 1 tablet daily except 1/2 tablet on Tuesdays and Saturdays.  Recheck in 2 weeks.

## 2010-03-07 NOTE — Medication Information (Signed)
Summary: rov/ewj  Anticoagulant Therapy  Managed by: Shelby Dubin, PharmD, BCPS, CPP Referring MD: Dr Johney Frame PCP: Catha Gosselin, MD Supervising MD: Gala Romney MD, Reuel Boom Indication 1: Atrial Fibrillation Lab Used: [facility] [place] Site: [address] INR POC 3.2 INR RANGE 2.0-3.0  Dietary changes: no    Health status changes: no    Bleeding/hemorrhagic complications: no    Recent/future hospitalizations: yes       Details: s/p procedure on 2/8 (TEE 2/7)..  Any changes in medication regimen? yes       Details: 10 mg on Tuesday, 7.5 mg Wednesday, 5 mg Thursday.   Recent/future dental: no  Any missed doses?: no       Is patient compliant with meds? yes       Current Medications (verified): 1)  Wellbutrin Xl 300 Mg Xr24h-Tab (Bupropion Hcl) .... Take One Tablet By Mouth Once Daily. 2)  Sertraline Hcl 100 Mg Tabs (Sertraline Hcl) .Marland Kitchen.. 1 By Mouth Daily 3)  Imitrex 100 Mg Tabs (Sumatriptan Succinate) .... Uad 4)  Fioricet 50-325-40 Mg Tabs (Butalbital-Apap-Caffeine) .... Uad As Needed 5)  Estrace 0.1 Mg (Estradiol) .... Uad 6)  Promethazine Hcl 25 Mg Tabs (Promethazine Hcl) .Marland Kitchen.. 1-2 Tablets As Needed 7)  Xanax 0.25 Mg Tabs (Alprazolam) .Marland Kitchen.. 1 Tablet As Needed 8)  Aspirin Ec 325 Mg Tbec (Aspirin) .... Take One Tablet By Mouth Daily As Needed 9)  Flecainide Acetate 100 Mg Tabs (Flecainide Acetate) .... Take One Tablet By Mouth Every 12 Hours 10)  Warfarin Sodium 5 Mg Tabs (Warfarin Sodium) .... One By Mouth Daily  Allergies (verified): 1)  ! Erythromycin 2)  ! Cipro  Anticoagulation Management History:      The patient is taking warfarin and comes in today for a routine follow up visit.  Negative risk factors for bleeding include an age less than 71 years old.  The bleeding index is 'low risk'.  Negative CHADS2 values include Age > 22 years old.  Anticoagulation responsible provider: Bensimhon MD, Reuel Boom.  INR POC: 3.2.  Cuvette Lot#: [id number].  Exp: 05/2010.     Anticoagulation Management Assessment/Plan:      The patient's current anticoagulation dose is Warfarin sodium 5 mg tabs: one by mouth daily.  The target INR is 2.0-3.0.  The next INR is due 03/30/2009.  Anticoagulation instructions were given to patient.  Results were reviewed/authorized by Shelby Dubin, PharmD, BCPS, CPP.  She was notified by Shelby Dubin PharmD, BCPS, CPP.         Prior Anticoagulation Instructions: INR 2.7  Continue on same dosage 1.5 tablets daily except 1 tablet on Thursdays.  Recheck in 1 week.    Current Anticoagulation Instructions: INR 3.2  Continue 1.5 tabs daily except 1 tab each Thursday.  Recheck in 2 weeks.

## 2010-03-07 NOTE — Letter (Signed)
Summary: ELectrophysiology/Ablation Procedure Instructions  [facility], Main Office  [address]   [place], [place] [address]   Phone: [phone]  Fax: [phone]     Electrophysiology/Ablation Procedure Instructions    You are scheduled for a(n) a-fib ablation on 07/13/09 at 7:30am with Dr. Johney Frame.  1.  Please come to the Short Stay Center at [facility] at 5:30am on the day of your procedure.  2.  Come prepared to stay overnight.   Please bring your insurance cards and a list of your medications.  3.  Come to the [place] office on 07/04/09 at 9:45am for lab work.    You do not have to be fasting.  4.  Do not have anything to eat or drink after midnight the night before your procedure.  5.  Do NOT take these medications for the day before and the day of your procedure unless otherwise instructed:  Cardizem.  All of your remaining medications may be taken with a small amount of water.    * Occasionally, EP studies and ablations can become lengthy.  Please make your family aware of this before your procedure starts.  Average time ranges from 2-8 hours for EP studies/ablations.  Your physician will locate your family after the procedure with the results.  * If you have any questions after you get home, please call the office at [phone].  Anselm Pancoast  TEE-- with Dr Lonn Georgia office on 07/12/09.  They will call you to arrange

## 2010-03-07 NOTE — Assessment & Plan Note (Signed)
Summary: eph/jml   Visit Type:  Follow-up Referring Provider:  Garnette Scheuermann, MD Primary Provider:  Catha Gosselin, MD   History of Present Illness: Ms Disch presents today for electrophysiology followup.  She continues to have episodes of symptomatic afib.  She reports that these occur every few days.  She denies significant elevation in heart rates but reports associated SOB.  She denies any further symptomatic post termination pauses.  The patient denies symptoms of chest pain, orthopnea, PND, lower extremity edema,  presyncope, syncope or neurologic sequela. The patient is tolerating medications without difficulties and is otherwise without complaint today.   Current Medications (verified): 1)  Wellbutrin Xl 300 Mg Xr24h-Tab (Bupropion Hcl) .... Take One Tablet By Mouth Once Daily. 2)  Sertraline Hcl 100 Mg Tabs (Sertraline Hcl) .Marland Kitchen.. 1 By Mouth Daily 3)  Imitrex 100 Mg Tabs (Sumatriptan Succinate) .... Uad 4)  Fioricet 50-325-40 Mg Tabs (Butalbital-Apap-Caffeine) .... Uad As Needed 5)  Estrace 0.1 Mg (Estradiol) .... Uad 6)  Promethazine Hcl 25 Mg Tabs (Promethazine Hcl) .Marland Kitchen.. 1-2 Tablets As Needed 7)  Xanax 0.25 Mg Tabs (Alprazolam) .Marland Kitchen.. 1 Tablet As Needed 8)  Aspirin Ec 325 Mg Tbec (Aspirin) .... Take One Tablet By Mouth Daily As Needed 9)  Amiodarone Hcl 200 Mg Tabs (Amiodarone Hcl) .... Take One Tablet By Mouth Two Times A Day 10)  Warfarin Sodium 5 Mg Tabs (Warfarin Sodium) .... Uad 11)  Cardizem Cd 240 Mg Xr24h-Cap (Diltiazem Hcl Coated Beads) .... Take One Tablet By Mouth Once Daily. 12)  Aspirin Ec 325 Mg Tbec (Aspirin) .... Take One Tablet By Mouth Daily  Allergies (verified): 1)  ! Erythromycin 2)  ! Cipro  Past History:  Past Medical History: Reviewed history from 04/27/2009 and no changes required. ATRIAL FIBRILLATION and TYPICAL APPEARING ATRIAL FLUTTER with post-termination pauses s/p afib ablation 2/11 and CTI ablation 3/11 Rheumatic fever 1957 COPD  DEPRESSION    MIGRAINE HEADACHE   Past Surgical History: Reviewed history from 02/05/2009 and no changes required. Knee surgery 1960, 1963, 1999, 2006 TAH 1990 Rhinoplasty age 84  Social History: Reviewed history from 02/05/2009 and no changes required. 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 Married.  No children. Tobacco Use - Yes. < 1PPD presently x 40 years Alcohol Use - 1-2 glasses of wine per year Regular Exercise - yes  Vital Signs:  Patient profile:   60 year old female Height:      67 inches Weight:      158 pounds BMI:     24.84 Pulse rate:   99 / minute Pulse rhythm:   irregular BP sitting:   120 / 80  (left arm)  Vitals Entered By: Laurance Flatten CMA (May 30, 2009 11:49 AM)  Physical Exam  General:  Well developed, well nourished, in no acute distress. Head:  normocephalic and atraumatic Eyes:  PERRLA/EOM intact; conjunctiva and lids normal. Mouth:  Teeth, gums and palate normal. Oral mucosa normal. Neck:  Neck supple, no JVD. No masses, thyromegaly or abnormal cervical nodes. Lungs:  Clear bilaterally to auscultation and percussion. Heart:  irregularly irregular rhythm, no m/r/g Abdomen:  Bowel sounds positive; abdomen soft and non-tender without masses, organomegaly, or hernias noted. No hepatosplenomegaly. Msk:  Back normal, normal gait. Muscle strength and tone normal. Pulses:  pulses normal in all 4 extremities Extremities:  No clubbing or cyanosis. Neurologic:  CNII-XII intact, strength/sensation are intact, FNF normal Skin:  R  flank lesions are much improved Cervical Nodes:  no significant adenopathy Psych:  Normal affect.   EKG  Procedure date:  05/30/2009  Findings:      atypical atrial flutter, V rates90s  Impression & Recommendations:  Problem # 1:  ATRIAL FIBRILLATION (ICD-427.31) The patient has had a difficult time with both atrial fibrillation and atrial flutter after ablation.  Her  ventricular rates have occasionally been fast and rate control is complicated by post termination pauses.  She had recurrence of isthmus dependant RA flutter after her initial ablation 2/11 and therefore underwent  repeat CTI ablation 04/11/09.  She continues to have ERAF.   We will decrease amiodarone to 200mg  daily and continue coumadin. She will return in 3-4 weeks.  IF her afib persists, then we will consider repeat catheter ablation.  We will check TFTs and LFTs on amiodarone today.  Other Orders: EKG w/ Interpretation (93000) TLB-Hepatic/Liver Function Pnl (80076-HEPATIC) TLB-TSH (Thyroid Stimulating Hormone) (16109-UEA)  Patient Instructions: 1)  Your physician recommends that you schedule a follow-up appointment in: 3 weeks with Dr Johney Frame 2)  Your physician recommends that you return for lab work today 3)  Your physician has recommended you make the following change in your medication: decrease Amiodarone to 200mg  daily

## 2010-03-07 NOTE — Medication Information (Signed)
Summary: rov/sp  Anticoagulant Therapy  Managed by: [name], PharmD Referring MD: Dr Johney Frame PCP: Catha Gosselin, MD Supervising MD: Johney Frame MD, Fayrene Fearing Indication 1: Atrial Fibrillation Lab Used: [facility] [place] Site: [address] INR POC 3.0 INR RANGE 2.0-3.0  Dietary changes: no    Health status changes: no    Bleeding/hemorrhagic complications: no    Recent/future hospitalizations: no    Any changes in medication regimen? no    Recent/future dental: no  Any missed doses?: no       Is patient compliant with meds? yes       Allergies: 1)  ! Erythromycin 2)  ! Cipro  Anticoagulation Management History:      The patient is taking warfarin and comes in today for a routine follow up visit.  Negative risk factors for bleeding include an age less than 7 years old.  The bleeding index is 'low risk'.  Negative CHADS2 values include Age > 60 years old.  Anticoagulation responsible provider: Thaila Bottoms MD, Fayrene Fearing.  INR POC: 3.0.  Cuvette Lot#: [id number].  Exp: 09/2010.    Anticoagulation Management Assessment/Plan:      The patient's current anticoagulation dose is Warfarin sodium 5 mg tabs: UAD.  The target INR is 2.0-3.0.  The next INR is due 07/04/2009.  Anticoagulation instructions were given to patient.  Results were reviewed/authorized by [name], PharmD.  She was notified by [name] PharmD.         Prior Anticoagulation Instructions: INR 2.8  Take 1/2 tablet today then continue same dose of 1 tablet every day except 1/2 tablet on Tuesday and Saturday   Current Anticoagulation Instructions: INR 3.0  Continue same dose fo 1 tablet every day except 1/2 tablet on Tuesday and Saturday

## 2010-03-07 NOTE — Assessment & Plan Note (Signed)
Summary: per check out/sf   Visit Type:  Follow-up Referring Provider:  Garnette Scheuermann, MD Primary Provider:  Catha Gosselin, MD   History of Present Illness: The patient presents today for routine electrophysiology followup. She reports doing very well since last being seen in our clinic. The patient denies symptoms of chest pain, shortness of breath, orthopnea, PND, lower extremity edema, dizziness, presyncope, syncope, or neurologic sequela.  She has had no sustained afib.  She thought that she might be in afib on sunday after doing yard work, however her husband listened to her heart and found it to be regular.  The patient is tolerating medications without difficulties and is otherwise without complaint today.   Current Medications (verified): 1)  Wellbutrin Xl 300 Mg Xr24h-Tab (Bupropion Hcl) .... Take One Tablet By Mouth Once Daily. 2)  Sertraline Hcl 100 Mg Tabs (Sertraline Hcl) .... 1 By Mouth Daily 3)  Imitrex 100 Mg Tabs (Sumatriptan Succinate) .... Uad 4)  Fioricet 50-325-40 Mg Tabs (Butalbital-Apap-Caffeine) .... Uad As Needed 5)  Estrace 0.1 Mg (Estradiol) .... Uad 6)  Promethazine Hcl 25 Mg Tabs (Promethazine Hcl) .... 1-2 Tablets As Needed 7)  Xanax 0.25 Mg Tabs (Alprazolam) .... 1 Tablet As Needed 8)  Warfarin Sodium 5 Mg Tabs (Warfarin Sodium) .... Uad 9)  Cardizem Cd 240 Mg Xr24h-Cap (Diltiazem Hcl Coated Beads) .... Take One Tablet By Mouth Once Daily.  Allergies: 1)  ! Erythromycin 2)  ! Cipro  Past History:  Past Medical History: Reviewed history from 08/15/2009 and no changes required. ATRIAL FIBRILLATION and TYPICAL APPEARING ATRIAL FLUTTER with post-termination pauses s/p afib ablation 2/11 and CTI ablation 3/11,  repeat afib ablation 6/11 Rheumatic fever 1957 COPD  DEPRESSION  MIGRAINE HEADACHE   Past Surgical History: Reviewed history from 08/15/2009 and no changes required. Knee surgery 1960, 1963, 1999, 2006 TAH 1990 Rhinoplasty age 13 PVI 2/11 and  6/11,  CTI ablation 3/11  Vital Signs:  Patient profile:   60 year old female Height:      67 inches Weight:      161 pounds BMI:     25 .31 Pulse rate:   58 / minute BP sitting:   124 / 76  (left arm)  Vitals Entered By: Laurance Flatten CMA (October 10, 2009 11:47 AM)  Physical Exam  General:  Well developed, well nourished, in no acute distress. Head:  normocephalic and atraumatic Eyes:  PERRLA/EOM intact; conjunctiva and lids normal. Mouth:  Teeth, gums and palate normal. Oral mucosa normal. Neck:  Neck supple, no JVD. No masses, thyromegaly or abnormal cervical nodes. Lungs:  Clear bilaterally to auscultation and percussion. Heart:  Non-displaced PMI, chest non-tender; regular rate and rhythm, S1, S2 without murmurs, rubs or gallops. Carotid upstroke normal, no bruit. Normal abdominal aortic size, no bruits. Femorals normal pulses, no bruits. Pedals normal pulses. No edema, no varicosities. Abdomen:  Bowel sounds positive; abdomen soft and non-tender without masses, organomegaly, or hernias noted. No hepatosplenomegaly. Msk:  Back normal, normal gait. Muscle strength and tone normal. Pulses:  pulses normal in all 4 extremities Extremities:  No clubbing or cyanosis. Neurologic:  Alert and oriented x 3.   EKG  Procedure date:  10/10/2009  Findings:      sinus bradycardia 58 BPM with PACs, otherwise normal ekg  Impression & Recommendations:  Problem # 1:  ATRIAL FIBRILLATION (ICD-427.31) doing well s/p ablation without recurrent off amiodarone Her CHADS2 score is 0.  We will therefore stop coumadin and return to ASA 325mg   daily today.  Other Orders: EKG w/ Interpretation (93000)  Patient Instructions: 1)  Your physician recommends that you schedule a follow-up appointment in: 6 months with Dr Johney Frame 2)  Your physician has recommended you make the following change in your medication: stop Coumadin

## 2010-03-07 NOTE — Medication Information (Signed)
Summary: rov/cb  Anticoagulant Therapy  Managed by: Bethena Midget, RN, BSN Referring MD: Dr Johney Frame PCP: Catha Gosselin, MD Supervising MD: Eden Emms MD, Theron Arista Indication 1: Atrial Fibrillation Lab Used: [facility] [place] Site: [address] INR POC 2.2 INR RANGE 2.0-3.0  Dietary changes: no    Health status changes: no    Bleeding/hemorrhagic complications: no    Recent/future hospitalizations: yes       Details: Ablation on 07/13/09 with Dr Johney Frame  Any changes in medication regimen? no    Recent/future dental: no  Any missed doses?: no       Is patient compliant with meds? yes       Allergies: 1)  ! Erythromycin 2)  ! Cipro  Anticoagulation Management History:      The patient is taking warfarin and comes in today for a routine follow up visit.  Negative risk factors for bleeding include an age less than 84 years old.  The bleeding index is 'low risk'.  Negative CHADS2 values include Age > 51 years old.  Anticoagulation responsible provider: Eden Emms MD, Theron Arista.  INR POC: 2.2.  Cuvette Lot#: [id number].  Exp: 09/2010.    Anticoagulation Management Assessment/Plan:      The patient's current anticoagulation dose is Warfarin sodium 5 mg tabs: UAD.  The target INR is 2.0-3.0.  The next INR is due 08/15/2009.  Anticoagulation instructions were given to patient.  Results were reviewed/authorized by Bethena Midget, RN, BSN.  She was notified by Bethena Midget, RN, BSN.         Prior Anticoagulation Instructions: INR 2.1. Take 1.5 tablets today, then take 1 tablet daily except 0.5 tablet on Tues and Sat.  Resume this dose after procedure. Recheck in 1 week.  Current Anticoagulation Instructions: INR 2.2 Continue 5mg s everyday except 2.5mg s on Tuesdays and Saturdays. Recheck in 4 weeks

## 2010-03-07 NOTE — Medication Information (Signed)
Summary: rov/ewj  Anticoagulant Therapy  Managed by: Cloyde Reams, RN, BSN Referring MD: Dr Johney Frame PCP: Catha Gosselin, MD Supervising MD: Tenny Craw MD, Gunnar Fusi Indication 1: Atrial Fibrillation Lab Used: [facility] [place] Site: [address] INR POC 2.0 INR RANGE 2.0-3.0           Allergies: 1)  ! * Eyrthomicin 2)  ! Cipro  Anticoagulation Management History:      Negative risk factors for bleeding include an age less than 69 years old.  The bleeding index is 'low risk'.  Negative CHADS2 values include Age > 39 years old.  Anticoagulation responsible provider: Tenny Craw MD, Gunnar Fusi.  INR POC: 2.0.    Anticoagulation Management Assessment/Plan:      The patient's current anticoagulation dose is Warfarin sodium 5 mg tabs: one by mouth daily.  The target INR is 2.0-3.0.  The next INR is due 03/09/2009.  Results were reviewed/authorized by Cloyde Reams, RN, BSN.  She was notified by Cloyde Reams RN.         Prior Anticoagulation Instructions: INR 1.9  Take 10mg  today, then start taking 7.5mg  daily except 5mg  on Sundays, Tuesdays, and Thursdays.  Recheck on Friday.  Recheck weekly pending ablation on 03/19/09.    Current Anticoagulation Instructions: INR 2.0  Take 10mg  today then increase dosage to 7.5mg  daily except 5mg  on Thursdays.  Recheck in 1 week.

## 2010-03-07 NOTE — Letter (Signed)
Summary: patient med list  patient med list   Imported By: Kassie Mends 03/27/2009 10:04:46  _____________________________________________________________________  External Attachment:    Type:   Image     Comment:   External Document

## 2010-03-07 NOTE — Medication Information (Signed)
Summary: rov/ewj  Anticoagulant Therapy  Managed by: Cloyde Reams, RN, BSN Referring MD: Dr Johney Frame PCP: Catha Gosselin, MD Supervising MD: Eden Emms MD, Theron Arista Indication 1: Atrial Fibrillation Lab Used: [facility] [place] Site: [address] INR POC 2.5 INR RANGE 2.0-3.0  Dietary changes: no    Health status changes: no    Bleeding/hemorrhagic complications: yes       Details: Incr bruising on legs.  Recent/future hospitalizations: no    Any changes in medication regimen? yes       Details: Decr Amiodarone to 400mg  daily  Recent/future dental: no  Any missed doses?: no       Is patient compliant with meds? yes       Allergies: 1)  ! Erythromycin 2)  ! Cipro  Anticoagulation Management History:      The patient is taking warfarin and comes in today for a routine follow up visit.  Negative risk factors for bleeding include an age less than 44 years old.  The bleeding index is 'low risk'.  Negative CHADS2 values include Age > 41 years old.  Anticoagulation responsible provider: Eden Emms MD, Theron Arista.  INR POC: 2.5.  Cuvette Lot#: [id number].  Exp: 06/2010.    Anticoagulation Management Assessment/Plan:      The patient's current anticoagulation dose is Warfarin sodium 5 mg tabs: UAD.  The target INR is 2.0-3.0.  The next INR is due 05/25/2009.  Anticoagulation instructions were given to patient.  Results were reviewed/authorized by Cloyde Reams, RN, BSN.  She was notified by Cloyde Reams RN.         Prior Anticoagulation Instructions: INR 2.2  Continue on same dosage 1 tablet daily except 1/2 tablet on Tuesdays and Saturdays.  Recheck in 2 weeks.    Current Anticoagulation Instructions: INR 2.5  Continue on same dosage 1 tablet daily except 1/2 tablet on Tuesdays and Saturdays.  Recheck in 2 weeks.

## 2010-03-07 NOTE — Medication Information (Signed)
Summary: ccr/post hosp per rhonda call/lg  Anticoagulant Therapy  Managed by: Bethena Midget, RN, BSN Referring MD: Dr Johney Frame PCP: Catha Gosselin, MD Supervising MD: Clifton James MD, Cristal Deer Indication 1: Atrial Fibrillation Lab Used: [facility] [place] Site: [address] INR POC 2.3 INR RANGE 2.0-3.0      Any changes in medication regimen? yes       Details: Amiodarone started while in [place]. 200mg s BID. 200mg s on Cardizem started and Flecidine Discontinued.      Comments: Had Ablation on Monday, 14th.  INR on 3/10 1.98 received 7.5mg s, 3/11 received 7.5mg s , 3/12 received 7.5mg s INR was 2.57. 3/13 recieved 2mg s, 3/14 received 5mg s INR 2.93. 3/15 2.5mg s at home, 3/16 5mg s and 3/17 took 2.5mg s. Today INR 2.3 Dr Jimmey Ralph suggested 5mg s daily except 2.5mg s 2 days aweek with Amio on board.   Allergies: 1)  ! Erythromycin 2)  ! Cipro  Anticoagulation Management History:      The patient is taking warfarin and comes in today for a routine follow up visit.  Negative risk factors for bleeding include an age less than 15 years old.  The bleeding index is 'low risk'.  Negative CHADS2 values include Age > 17 years old.  Anticoagulation responsible provider: Clifton James MD, Cristal Deer.  INR POC: 2.3.  Cuvette Lot#: [id number].  Exp: 06/2010.    Anticoagulation Management Assessment/Plan:      The patient's current anticoagulation dose is Warfarin sodium 5 mg tabs: one by mouth daily.  The target INR is 2.0-3.0.  The next INR is due 04/27/2009.  Anticoagulation instructions were given to patient.  Results were reviewed/authorized by Bethena Midget, RN, BSN.  She was notified by Bethena Midget, RN, BSN.         Prior Anticoagulation Instructions: INR 3.2  Take 1 tablet tonight.  Start NEW dosing schedule of 1 tablet on Tuesday and Thursday and 1.5 tablets all other days.  Return to clinic in 2 weeks.    Current Anticoagulation Instructions: INR 2.3 Change dose to 5mg s daily except  2.5mg s on Tuesdays and Saturdays. Recheck in one week.

## 2010-03-07 NOTE — Letter (Signed)
Summary: [name] Visit  [facility] Physicians Office Visit   Imported By: Kassie Mends 03/08/2009 08:15:40  _____________________________________________________________________  External Attachment:    Type:   Image     Comment:   External Document
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
## 2010-03-07 NOTE — Medication Information (Signed)
Summary: rov/tm  Anticoagulant Therapy  Managed by: Cloyde Reams, RN, BSN Referring MD: Dr Johney Frame PCP: Catha Gosselin, MD Supervising MD: Shirlee Latch MD, [place] Indication 1: Atrial Fibrillation Lab Used: [facility] [place] Site: [address] INR POC 2.3 INR RANGE 2.0-3.0  Dietary changes: yes       Details: Incr intake of salads this week.    Health status changes: no    Bleeding/hemorrhagic complications: no    Recent/future hospitalizations: no    Any changes in medication regimen? no    Recent/future dental: no  Any missed doses?: no       Is patient compliant with meds? yes       Allergies: 1)  ! Erythromycin 2)  ! Cipro  Anticoagulation Management History:      The patient is taking warfarin and comes in today for a routine follow up visit.  Negative risk factors for bleeding include an age less than 66 years old.  The bleeding index is 'low risk'.  Negative CHADS2 values include Age > 8 years old.  Anticoagulation responsible provider: Shirlee Latch MD, [place].  INR POC: 2.3.  Cuvette Lot#: [id number].  Exp: 11/2010.    Anticoagulation Management Assessment/Plan:      The patient's current anticoagulation dose is Warfarin sodium 5 mg tabs: UAD.  The target INR is 2.0-3.0.  The next INR is due 10/10/2009.  Anticoagulation instructions were given to patient.  Results were reviewed/authorized by Cloyde Reams, RN, BSN.  She was notified by Cloyde Reams RN.         Prior Anticoagulation Instructions: INR 1.4 Today take 7.5mg s then change dose 5mg s daily except 2.5mg s on Tuesdays. Recheck in 10 days.   Current Anticoagulation Instructions: INR 2.3  Continue on same dosage 1 tablet daily except 1/2 tablet on Tuesdays.  Recheck in 2 weeks.

## 2010-03-07 NOTE — Letter (Signed)
Summary: [facility]   [facility]   Imported By: Roderic Ovens 08/10/2009 14:53:40  _____________________________________________________________________  External Attachment:    Type:   Image     Comment:   External Document

## 2010-03-07 NOTE — Medication Information (Signed)
Summary: rov/tm  Anticoagulant Therapy  Managed by: Bethena Midget, RN, BSN Referring MD: Dr Johney Frame PCP: Catha Gosselin, MD Supervising MD: Johney Frame MD, Fayrene Fearing Indication 1: Atrial Fibrillation Lab Used: [facility] [place] Site: [address] INR POC 2.9 INR RANGE 2.0-3.0  Dietary changes: no    Health status changes: no    Bleeding/hemorrhagic complications: no    Recent/future hospitalizations: no    Any changes in medication regimen? yes       Details: decreased amiodarone to 100mg  daily from 200mg  daily and D/C Aspirin  Recent/future dental: no  Any missed doses?: no       Is patient compliant with meds? yes       Allergies: 1)  ! Erythromycin 2)  ! Cipro  Anticoagulation Management History:      The patient is taking warfarin and comes in today for a routine follow up visit.  Negative risk factors for bleeding include an age less than 8 years old.  The bleeding index is 'low risk'.  Negative CHADS2 values include Age > 41 years old.  Anticoagulation responsible provider: Yuri Fana MD, Fayrene Fearing.  INR POC: 2.9.  Cuvette Lot#: [id number].  Exp: 10/2010.    Anticoagulation Management Assessment/Plan:      The patient's current anticoagulation dose is Warfarin sodium 5 mg tabs: UAD.  The target INR is 2.0-3.0.  The next INR is due 09/12/2009.  Anticoagulation instructions were given to patient.  Results were reviewed/authorized by Bethena Midget, RN, BSN.  She was notified by Dillard Cannon.         Prior Anticoagulation Instructions: INR 2.2 Continue 5mg s everyday except 2.5mg s on Tuesdays and Saturdays. Recheck in 4 weeks  Current Anticoagulation Instructions: INR 2.9  Continue same regimen of 1 tab on Sunday, Monday, Wednesday, Thursday, and Friday and 1/2 tab on Tuesday and Saturday.  Re-check INR in 4 weeks.

## 2010-03-07 NOTE — Medication Information (Signed)
Summary: rov/ewj  Anticoagulant Therapy  Managed by: [name], PharmD Referring MD: Dr Johney Frame PCP: Catha Gosselin, MD Supervising MD: Shirlee Latch MD, Maleeya Peterkin Indication 1: Atrial Fibrillation Lab Used: [facility] [place] Site: [address] INR POC 2.8 INR RANGE 2.0-3.0  Dietary changes: no    Health status changes: no    Bleeding/hemorrhagic complications: yes       Details: having bruises on her legs and torso. She has a history of bruising easily on ASA and IBU   Any changes in medication regimen? yes       Details: decreased amiodarone to 400mg  daily on 4/8  Recent/future dental: no  Any missed doses?: no       Is patient compliant with meds? yes       Allergies: 1)  ! Erythromycin 2)  ! Cipro  Anticoagulation Management History:      The patient is taking warfarin and comes in today for a routine follow up visit.  Negative risk factors for bleeding include an age less than 49 years old.  The bleeding index is 'low risk'.  Negative CHADS2 values include Age > 75 years old.  Anticoagulation responsible provider: Shirlee Latch MD, Klair Leising.  INR POC: 2.8.  Cuvette Lot#: [id number].  Exp: 06/2010.    Anticoagulation Management Assessment/Plan:      The patient's current anticoagulation dose is Warfarin sodium 5 mg tabs: UAD.  The target INR is 2.0-3.0.  The next INR is due 06/20/2009.  Anticoagulation instructions were given to patient.  Results were reviewed/authorized by [name], PharmD.  She was notified by [name] PharmD.         Prior Anticoagulation Instructions: INR 2.5  Continue on same dosage 1 tablet daily except 1/2 tablet on Tuesdays and Saturdays.  Recheck in 2 weeks.    Current Anticoagulation Instructions: INR 2.8  Take 1/2 tablet today then continue same dose of 1 tablet every day except 1/2 tablet on Tuesday and Saturday

## 2010-03-07 NOTE — Medication Information (Signed)
Summary: rov/ln  Anticoagulant Therapy  Managed by: Bethena Midget, RN, BSN Referring MD: Dr Johney Frame PCP: Catha Gosselin, MD Supervising MD: Shirlee Latch MD, Shakaria Raphael Indication 1: Atrial Fibrillation Lab Used: [facility] [place] Site: [address] INR POC 1.4 INR RANGE 2.0-3.0  Dietary changes: no    Health status changes: no    Bleeding/hemorrhagic complications: no    Recent/future hospitalizations: no    Any changes in medication regimen? no    Recent/future dental: no  Any missed doses?: yes     Details: missed last Thursday's dose  Is patient compliant with meds? yes       Allergies: 1)  ! Erythromycin 2)  ! Cipro  Anticoagulation Management History:      The patient is taking warfarin and comes in today for a routine follow up visit.  Negative risk factors for bleeding include an age less than 77 years old.  The bleeding index is 'low risk'.  Negative CHADS2 values include Age > 24 years old.  Anticoagulation responsible provider: Shirlee Latch MD, Macai Sisneros.  INR POC: 1.4.  Cuvette Lot#: [id number].  Exp: 11/2010.    Anticoagulation Management Assessment/Plan:      The patient's current anticoagulation dose is Warfarin sodium 5 mg tabs: UAD.  The target INR is 2.0-3.0.  The next INR is due 09/21/2009.  Anticoagulation instructions were given to patient.  Results were reviewed/authorized by Bethena Midget, RN, BSN.  She was notified by Bethena Midget, RN, BSN.         Prior Anticoagulation Instructions: INR 2.9  Continue same regimen of 1 tab on Sunday, Monday, Wednesday, Thursday, and Friday and 1/2 tab on Tuesday and Saturday.  Re-check INR in 4 weeks.      Current Anticoagulation Instructions: INR 1.4 Today take 7.5mg s then change dose 5mg s daily except 2.5mg s on Tuesdays. Recheck in 10 days.

## 2010-03-07 NOTE — Procedures (Signed)
Summary: Device Strips  Device Strips   Imported By: Roderic Ovens 07/03/2009 13:59:22  _____________________________________________________________________  External Attachment:    Type:   Image     Comment:   External Document

## 2010-03-07 NOTE — Letter (Signed)
Summary: ELectrophysiology/Ablation Procedure Instructions  [facility], Main Office  [address]   [place], [place] [address]   Phone: [phone]  Fax: [phone]     Electrophysiology/Ablation Procedure Instructions    You are scheduled for a(n) atrial fib/flutter ablation on 02/27/09 at 7:30am with Dr. Johney Frame  1.  Please come to the Short Stay Center at [facility] at 5:45am on the day of your procedure.  2.  Come prepared to stay overnight.   Please bring your insurance cards and a list of your medications.  3.   Have  lab work on 02/20/09 at [facility].     You do not have to be fasting.  4.  Do not have anything to eat or drink after midnight the night before your procedure.    6.  Educational material received:    Ablation   * Occasionally, EP studies and ablations can become lengthy.  Please make your family aware of this before your procedure starts.  Average time ranges from 2-8 hours for EP studies/ablations.  Your physician will locate your family after the procedure with the results.  * If you have any questions after you get home, please call the office at [phone]. Suzanne [name]  TEE- will be on 02/26/09  Dr Michaelle Copas office will call you to set up

## 2010-03-07 NOTE — Medication Information (Signed)
Summary: rov/ewj  Anticoagulant Therapy  Managed by: Suzanne Reams, Suzanne [name], Suzanne [name] Referring MD: Dr Johney Frame PCP: Catha Gosselin, MD Supervising MD: Antoine Poche MD, Fayrene Fearing Indication 1: Atrial Fibrillation Lab Used: [facility] [place] Site: [address] INR POC 1.9 INR RANGE 2.0-3.0  Dietary changes: no    Health status changes: no    Bleeding/hemorrhagic complications: no    Recent/future hospitalizations: no    Any changes in medication regimen? no    Recent/future dental: no  Any missed doses?: no       Is patient compliant with meds? yes      Comments: Pending ablation on 03/19/09.  Weekly INR's.  Pt's previous dosage has been 7.5mg  MWF/5mg  once daily INR on 1/18 1.5, INR on 1/14 1.94.   Allergies (verified): 1)  ! * Eyrthomicin 2)  ! Cipro  Anticoagulation Management History:      The patient is taking warfarin and comes in today for a routine follow up visit.  Negative risk factors for bleeding include an age less than 15 years old.  The bleeding index is 'low risk'.  Negative CHADS2 values include Age > 46 years old.  Anticoagulation responsible provider: Antoine Poche MD, Fayrene Fearing.  INR POC: 1.9.    Anticoagulation Management Assessment/Plan:      The patient's current anticoagulation dose is Warfarin sodium 5 mg tabs: one by mouth daily.  The target INR is 2.0-3.0.  The next INR is due 03/02/2009.  Results were reviewed/authorized by Suzanne Reams, Suzanne [name], Suzanne [name].  She was notified by Suzanne Reams, Suzanne [name], Suzanne [name].         Current Anticoagulation Instructions: INR 1.9  Take 10mg  today, then start taking 7.5mg  daily except 5mg  on Sundays, Tuesdays, and Thursdays.  Recheck on Friday.  Recheck weekly pending ablation on 03/19/09.

## 2010-03-07 NOTE — Medication Information (Signed)
Summary: rov/sp  Anticoagulant Therapy  Managed by: [name], PharmD Referring MD: Dr Johney Frame PCP: Catha Gosselin, MD Supervising MD: Riley Kill MD, Maisie Fus Indication 1: Atrial Fibrillation Lab Used: [facility]  Site: [address] INR POC 3.2 INR RANGE 2.0-3.0  Dietary changes: no    Health status changes: yes       Details: pending ablation next week.  Bleeding/hemorrhagic complications: no    Recent/future hospitalizations: no    Any changes in medication regimen? no    Recent/future dental: no  Any missed doses?: no       Is patient compliant with meds? yes       Allergies: 1)  ! Erythromycin 2)  ! Cipro  Anticoagulation Management History:      The patient is taking warfarin and comes in today for a routine follow up visit.  Negative risk factors for bleeding include an age less than 36 years old.  The bleeding index is 'low risk'.  Negative CHADS2 values include Age > 85 years old.  Anticoagulation responsible provider: Riley Kill MD, Maisie Fus.  INR POC: 3.2.  Cuvette Lot#: [id number].  Exp: 09/2010.    Anticoagulation Management Assessment/Plan:      The patient's current anticoagulation dose is Warfarin sodium 5 mg tabs: UAD.  The target INR is 2.0-3.0.  The next INR is due 07/12/2009.  Anticoagulation instructions were given to patient.  Results were reviewed/authorized by [name], PharmD.  She was notified by [name] PharmD.         Prior Anticoagulation Instructions: INR 3.0  Continue same dose fo 1 tablet every day except 1/2 tablet on Tuesday and Saturday  Current Anticoagulation Instructions: INR 3.2  Take 1/2 tablet today then continue same dose of 1 tablet every day except 1/2 tablet on Tuesday and Saturday.

## 2010-03-07 NOTE — Letter (Signed)
Summary: [facility]  [facility] Care [facility]   Imported By: Kassie Mends 05/04/2009 09:37:11  _____________________________________________________________________  External Attachment:    Type:   Image     Comment:   External Document

## 2010-03-07 NOTE — Medication Information (Signed)
Summary: Physician Order  Physician Order   Imported By: Roderic Ovens 05/04/2009 11:47:31  _____________________________________________________________________  External Attachment:    Type:   Image     Comment:   External Document

## 2010-03-07 NOTE — Assessment & Plan Note (Signed)
Summary: nep/pauses/jml   Visit Type:  Initial Consult Referring Provider:  Garnette Scheuermann, MD Primary Provider:  Catha Gosselin, MD  CC:  palpitations.  History of Present Illness: Suzanne [name] is a pleasant 60 yo WF with a history of paroxysmal atrial fibrillation and atrial flutter who presents today for EP consultation.  She was diagnosed with atrial fibrillaiton 11/14/08 when she was diagnosed to have both atrial fibrillation and atrial flutter on a holter monitor.  She was also noted to have 2-3 second pauses on the monitor.  She was placed on cardizem but continued to have episodes. She was placed on flecainide 50mg  BID 12/13/08 which was lated increased to 100mg  BID.  She had a subsequent event monitor placed 01/24/09 which revealed predominantly atrial flutter with pauses > 6 seconds. Though the patient has recently been diagnosed with atrial fibrillation, she has had symptoms of atrial fibrillation in retrospect for about a year.  These episodes have increased infrequency and duration and have been most prominent since October 2010. During atrial fibrillation, she reports symptoms of anxiety and "nervousness".  With atrial flutter, she feels that her heart is "pounding" and "racing".  She is able to continue working while in atrial fibrillation but feels SOB during atrial flutter.  She reports that with post-termination that she becomes dizzy and presyncopal.  She denies syncope, CP, orthopnea, PND, or edema.  She remains active in her job despite her arrhythmia.  Current Medications (verified): 1)  Wellbutrin Xl 300 Mg Xr24h-Tab (Bupropion Hcl) .... Take One Tablet By Mouth Once Daily. 2)  Sertraline Hcl 100 Mg Tabs (Sertraline Hcl) .Marland Kitchen.. 1 By Mouth Daily 3)  Imitrex 100 Mg Tabs (Sumatriptan Succinate) .... Uad 4)  Fioricet 50-325-40 Mg Tabs (Butalbital-Apap-Caffeine) .... Uad As Needed 5)  Estrace 0.1 Mg (Estradiol) .... Uad 6)  Vitamin D (Ergocalciferol) 50000 Unit Caps (Ergocalciferol)  .... Weekly 7)  Ibuprofen 600 Mg Tabs (Ibuprofen) .Marland Kitchen.. 1 Tablet As Needed 8)  Promethazine Hcl 25 Mg Tabs (Promethazine Hcl) .Marland Kitchen.. 1-2 Tablets As Needed 9)  Xanax 0.25 Mg Tabs (Alprazolam) .Marland Kitchen.. 1 Tablet As Needed 10)  Aspirin Ec 325 Mg Tbec (Aspirin) .... Take One Tablet By Mouth Daily As Needed 11)  Flecainide Acetate 100 Mg Tabs (Flecainide Acetate) .... Take One Tablet By Mouth Every 12 Hours  Allergies (verified): 1)  ! * Eyrthomicin 2)  ! 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  Husband is a Engineer, civil (consulting) on 3100 unit at [employer] Married.  No children. Tobacco Use - Yes. < 1PPD presently x 40 years Alcohol Use - 1-2 glasses of wine per year Regular Exercise - yes  Review of Systems       All systems are reviewed and negative except as listed in the HPI.   Vital Signs:  Patient profile:   60 year old female Height:      67 inches Weight:      157 pounds BMI:     24.68 Pulse rate:   62 / minute BP sitting:   132 / 80  (left arm)  Vitals Entered By: Suzanne [name] CMA (February 05, 2009 2:14 PM)  Physical Exam  General:  Well developed, well nourished, in no acute distress. Head:  normocephalic and atraumatic Eyes:  PERRLA/EOM intact; conjunctiva and lids normal. Nose:  no deformity, discharge, inflammation, or lesions Mouth:  Teeth, gums and palate normal. Oral mucosa normal. Neck:  Neck supple, no JVD. No masses, thyromegaly or abnormal cervical nodes. Lungs:  Clear bilaterally to auscultation and percussion. Heart:  Non-displaced PMI, chest non-tender; regular rate and rhythm, S1, S2 without murmurs, rubs or gallops. Carotid  upstroke normal, no bruit. Normal abdominal aortic size, no bruits. Femorals normal pulses, no bruits. Pedals normal pulses. No edema, no varicosities. Abdomen:  Bowel sounds positive; abdomen soft and non-tender without masses, organomegaly, or hernias noted. No hepatosplenomegaly. Msk:  Back normal, normal gait. Muscle strength and tone normal. Pulses:  pulses normal in all 4 extremities Extremities:  No clubbing or cyanosis. Neurologic:  Alert and oriented x 3.  CNII-XII intact, strength/sensation are intact Skin:  Intact without lesions or rashes. Cervical Nodes:  no significant adenopathy Psych:  Normal affect.   Nuclear Study  Procedure date:  01/02/2009  Findings:      Impression:  Normal wall motion. No reversible ischemia or infarction. Left ventriculae ejection fraction equal 55%  Suzanne Cage, MD  Echocardiogram  Procedure date:  11/14/2008  Findings:      Conclusions: Left ventricular ejection fraction estimated by 2D at 50-55 percent. Normal left atrialsizie. Nopericardial effusion. Normal right ventricularsize and function. Analysis of mitial valve inflow, pulmonary vein Doppler and tissue Doppler Grade I diagnostic dysfunction without elevated left atrial pressure.      EKG  Procedure date:  02/05/2009  Findings:      sinus 63 bpm, PVC, LA enlargement otherwise nl   Impression & Recommendations:  Problem # 1:  ATRIAL FIBRILLATION (ICD-427.31) Suzanne Mccormick is a pleasant 60 yo WF with a history of paroxysmal atrial fibrillation and atrial flutter who presents today for EP consultaiton.  She has very sypmtomatic atrial fibrillation and atrial flutter.  She has failed medical therapy with cardizem and flecainide.  Therapeutic strategies for afib and atrial flutter including medicine and ablation were discussed in detail with the patient today. Risk, benefits, and alternatives to EP study and radiofrequency ablation were also discussed in detail today.  These risks include but are not limited to stroke, bleeding, vascular damage, tamponade, perforation, damage to the esophagus, lungs, and other structures, pulmonary vein stenosis, worsening renal function, and death. The patient understands these risk and wishes to proceed.  We will initiate the patient on coumadin and schedule ablation at the next available time.    Problem # 2:  SINOATRIAL NODE DYSFUNCTION (ICD-427.81) The patient has been confirmed to have post termination pauses of over 4 seconds with symptoms of presyncope.  These only occur during post termination of atrial fibrillation and atrial flutter.  I have made no medicine changes today. At this point, I believe that the appropriate strategy is ablation for her atrial arrhythmias.  If we can eliminate her atrial arrhythmias, then these pauses should not be a problem.  I would not recommend a pacemaker at this time.  The patient is aware that she is at increased risk for syncope.  I have recommended that she not drive during episodes of her atrial arrhythmias, as further post termination pauses are possible.  Patient Instructions: 1)  Your physician recommends that you return for lab work please have weekly INR's and fax them to [facility] [phone] and pre procedure labs on 02/20/09 2)  Your physician has recommended  that you have an ablation.  Catheter ablation is a medical procedure used to treat some cardiac arrhythmias (irregular heartbeats). During catheter ablation, a long, thin, flexible tube is put into a blood vessel in your groin (upper thigh), or neck. This tube is called an ablation catheter. It is then guided to your heart through the blood vessel. Radiofrequency waves destroy small areas of heart tissue where abnormal heartbeats may cause an arrhythmia to start.  Please see the instruction sheet given to you today. Prescriptions: WARFARIN SODIUM 5 MG TABS (WARFARIN SODIUM) one by mouth daily  #30 x 3   Entered by:   Dennis Bast,  RN, BSN   Authorized by:   Hillis Range, MD   Signed by:   Dennis Bast, RN, BSN on 02/05/2009   Method used:   Electronically to        [facility]* (retail)       [address]       [address] Shipping/mailing       [place], [place]  [address]       Ph: [phone]       Fax: [phone]   RxID:   [id number]

## 2010-03-07 NOTE — Medication Information (Signed)
Summary: rov/sp  Anticoagulant Therapy  Managed by: Elaina Pattee, PharmD Referring MD: Dr Johney Frame PCP: Catha Gosselin, MD Supervising MD: Eden Emms MD, Theron Arista Indication 1: Atrial Fibrillation Lab Used: [facility] [place] Site: [address] INR POC 2.1 INR RANGE 2.0-3.0  Dietary changes: no    Health status changes: no    Bleeding/hemorrhagic complications: no    Recent/future hospitalizations: yes       Details: Ablation with TEE tomorrow.  Any changes in medication regimen? no    Recent/future dental: no  Any missed doses?: no       Is patient compliant with meds? yes       Allergies: 1)  ! Erythromycin 2)  ! Cipro  Anticoagulation Management History:      The patient is taking warfarin and comes in today for a routine follow up visit.  Negative risk factors for bleeding include an age less than 70 years old.  The bleeding index is 'low risk'.  Negative CHADS2 values include Age > 67 years old.  Anticoagulation responsible provider: Eden Emms MD, Theron Arista.  INR POC: 2.1.  Cuvette Lot#: [id number].  Exp: 09/2010.    Anticoagulation Management Assessment/Plan:      The patient's current anticoagulation dose is Warfarin sodium 5 mg tabs: UAD.  The target INR is 2.0-3.0.  The next INR is due 07/19/2009.  Anticoagulation instructions were given to patient.  Results were reviewed/authorized by Elaina Pattee, PharmD.  She was notified by Elaina Pattee, PharmD.         Prior Anticoagulation Instructions: INR 3.2  Take 1/2 tablet today then continue same dose of 1 tablet every day except 1/2 tablet on Tuesday and Saturday.   Current Anticoagulation Instructions: INR 2.1. Take 1.5 tablets today, then take 1 tablet daily except 0.5 tablet on Tues and Sat.  Resume this dose after procedure. Recheck in 1 week.

## 2010-03-07 NOTE — Letter (Signed)
[facility]  [facility] Progress [facility]   Imported By: Roderic Ovens 02/20/2009 12:04:31  _____________________________________________________________________  External Attachment:    Type:   Image     Comment:   External Document

## 2010-03-07 NOTE — Assessment & Plan Note (Signed)
Summary: eph/jss   Visit Type:  Follow-up Referring Provider:  Garnette Scheuermann, MD Primary Provider:  Catha Gosselin, MD   History of Present Illness: The patient presents today for routine electrophysiology followup. She reports doing very well since her most recent ablation.  She has had no further episodes of afib.  She is very happy with her improved quality of life.  The patient denies symptoms of palpitations, chest pain, shortness of breath, orthopnea, PND, lower extremity edema, dizziness, presyncope, syncope, or neurologic sequela. The patient is tolerating medications without difficulties and is otherwise without complaint today.   Current Medications (verified): 1)  Wellbutrin Xl 300 Mg Xr24h-Tab (Bupropion Hcl) .... Take One Tablet By Mouth Once Daily. 2)  Sertraline Hcl 100 Mg Tabs (Sertraline Hcl) .Marland Kitchen.. 1 By Mouth Daily 3)  Imitrex 100 Mg Tabs (Sumatriptan Succinate) .... Uad 4)  Fioricet 50-325-40 Mg Tabs (Butalbital-Apap-Caffeine) .... Uad As Needed 5)  Estrace 0.1 Mg (Estradiol) .... Uad 6)  Promethazine Hcl 25 Mg Tabs (Promethazine Hcl) .Marland Kitchen.. 1-2 Tablets As Needed 7)  Xanax 0.25 Mg Tabs (Alprazolam) .Marland Kitchen.. 1 Tablet As Needed 8)  Aspirin Ec 325 Mg Tbec (Aspirin) .... Take One Tablet By Mouth Daily As Needed 9)  Amiodarone Hcl 200 Mg Tabs (Amiodarone Hcl) .... Take One Tablet By Mouth Daily 10)  Warfarin Sodium 5 Mg Tabs (Warfarin Sodium) .... Uad 11)  Cardizem Cd 240 Mg Xr24h-Cap (Diltiazem Hcl Coated Beads) .... Take One Tablet By Mouth Once Daily.  Allergies: 1)  ! Erythromycin 2)  ! Cipro  Past History:  Past Medical History: ATRIAL FIBRILLATION and TYPICAL APPEARING ATRIAL FLUTTER with post-termination pauses s/p afib ablation 2/11 and CTI ablation 3/11,  repeat afib ablation 6/11 Rheumatic fever 1957 COPD  DEPRESSION  MIGRAINE HEADACHE   Past Surgical History: Knee surgery 1960, 1963, 1999, 2006 TAH 1990 Rhinoplasty age 23 PVI 2/11 and 6/11,  CTI ablation  3/11  Social History: Reviewed history from 02/05/2009 and no changes required. 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  Husband is a Engineer, civil (consulting) on 3100 unit at [employer]. Married.  No children. Tobacco Use - Yes. < 1PPD presently x 40 years Alcohol Use - 1-2 glasses of wine per year Regular Exercise - yes  Review of Systems       All systems are reviewed and negative except as listed in the HPI.   Vital Signs:  Patient profile:   60 year old female Height:      67 inches Weight:      161 pounds BMI:     25.31 Pulse rate:   58 / minute BP sitting:   120 / 82  (left arm)  Vitals Entered By: Laurance Flatten CMA (August 15, 2009 10:47 AM)  Physical Exam  General:  Well developed, well nourished, in no acute distress. Head:  normocephalic and atraumatic Mouth:  Teeth, gums and palate normal. Oral mucosa normal. Neck:  Neck supple, no JVD. No masses, thyromegaly or abnormal cervical nodes. Lungs:  Clear bilaterally to auscultation and percussion. Heart:  RRR, no m/r/g Abdomen:  Bowel sounds positive; abdomen soft and non-tender without masses, organomegaly, or hernias noted. No hepatosplenomegaly. Msk:  Back normal, normal gait. Muscle strength and tone normal. Pulses:  pulses normal in all 4 extremities Extremities:  No clubbing or cyanosis. Neurologic:  Alert and oriented x 3. Skin:  R flank/ back lesions from first ablation continue to heal   EKG  Procedure date:  08/15/2009  Findings:  sinus bradycardia 58 bpm, PR 156, otherwise normal ekg  Impression & Recommendations:  Problem # 1:  ATRIAL FIBRILLATION (ICD-427.31) doing very well without further afib post ablation we will decrease amiodarone to 100mg  daily today.  If no further afib in 4 weeks, she can stop amiodarone COntinue coumadin but stop ASA at this time. We will consider stopping coumadin if she remains in sinus in 2 months.  Problem # 2:  SINOATRIAL NODE DYSFUNCTION (ICD-427.81)  resolved  post ablation of afib  The following medications were removed from the medication list:    Aspirin Ec 325 Mg Tbec (Aspirin) .Marland Kitchen... Take one tablet by mouth daily as needed Her updated medication list for this problem includes:    Amiodarone Hcl 200 Mg Tabs (Amiodarone hcl) .Marland Kitchen... Take 1/2 tablet by mouth daily    Warfarin Sodium 5 Mg Tabs (Warfarin sodium) ..... Uad    Cardizem Cd 240 Mg Xr24h-cap (Diltiazem hcl coated beads) .Marland Kitchen... Take one tablet by mouth once daily.  Patient Instructions: 1)  Your physician recommends that you schedule a follow-up appointment in: 2 months with Dr Johney Frame 2)  Your physician has recommended you make the following change in your medication: stop aspirin and decrease Amiodarone to 100mg  daily

## 2010-03-14 ENCOUNTER — Encounter (INDEPENDENT_AMBULATORY_CARE_PROVIDER_SITE_OTHER): Payer: 59

## 2010-03-14 DIAGNOSIS — I4891 Unspecified atrial fibrillation: Secondary | ICD-10-CM

## 2010-03-21 NOTE — Progress Notes (Signed)
Summary: pt requesting monitor  Phone Note Call from Patient   Caller: Patient [phone] Reason for Call: Talk to Nurse Summary of Call: pt requesting monitor-pls call Initial call taken by: Glynda Jaeger,  March 06, 2010 11:07 AM  Follow-up for Phone Call        will discuss with DrAllred on Fri and call pt back she seems anxious not real sure her heart is going out of rhythm but ok to get monitor just not sure what kind  will order event and call pt Dennis Bast, RN, BSN  March 06, 2010 6:05 PM

## 2010-04-09 ENCOUNTER — Other Ambulatory Visit (HOSPITAL_COMMUNITY): Payer: Self-pay | Admitting: Obstetrics and Gynecology

## 2010-04-09 DIAGNOSIS — Z1231 Encounter for screening mammogram for malignant neoplasm of breast: Secondary | ICD-10-CM

## 2010-04-15 ENCOUNTER — Encounter: Payer: Self-pay | Admitting: Internal Medicine

## 2010-04-18 ENCOUNTER — Ambulatory Visit: Payer: Self-pay | Admitting: Internal Medicine

## 2010-04-22 LAB — PROTIME-INR
INR: 2.24 — ABNORMAL HIGH (ref 0.00–1.49)
INR: 2.66 — ABNORMAL HIGH (ref 0.00–1.49)
Prothrombin Time: 24.6 seconds — ABNORMAL HIGH (ref 11.6–15.2)
Prothrombin Time: 28.1 seconds — ABNORMAL HIGH (ref 11.6–15.2)

## 2010-04-22 LAB — MRSA PCR SCREENING: MRSA by PCR: NEGATIVE

## 2010-04-24 LAB — BASIC METABOLIC PANEL
BUN: 12 mg/dL (ref 6–23)
Calcium: 9.3 mg/dL (ref 8.4–10.5)
Creatinine, Ser: 0.84 mg/dL (ref 0.4–1.2)
GFR calc Af Amer: >60 mL/min (ref >60)
GFR calc non Af Amer: >60 mL/min (ref >60)

## 2010-04-24 LAB — PROTIME-INR
INR: 2.1 — ABNORMAL HIGH (ref 0.00–1.49)
INR: 2.21 — ABNORMAL HIGH (ref 0.00–1.49)
Prothrombin Time: 20.4 seconds — ABNORMAL HIGH (ref 11.6–15.2)
Prothrombin Time: 23.4 seconds — ABNORMAL HIGH (ref 11.6–15.2)

## 2010-04-24 LAB — CBC
Platelets: 167 10*3/uL (ref 150–400)
WBC: 6.3 10*3/uL (ref 4.0–10.5)

## 2010-04-24 LAB — APTT: aPTT: 32 seconds (ref 24–37)

## 2010-04-28 LAB — CBC
HCT: 38.1 % (ref 36.0–46.0)
HCT: 38.9 % (ref 36.0–46.0)
HCT: 40.5 % (ref 36.0–46.0)
Hemoglobin: 13.7 g/dL (ref 12.0–15.0)
MCHC: 33.9 g/dL (ref 30.0–36.0)
MCHC: 34.1 g/dL (ref 30.0–36.0)
MCV: 97.2 fL (ref 78.0–100.0)
MCV: 98.5 fL (ref 78.0–100.0)
Platelets: 128 10*3/uL — ABNORMAL LOW (ref 150–400)
Platelets: 148 10*3/uL — ABNORMAL LOW (ref 150–400)
Platelets: 149 10*3/uL — ABNORMAL LOW (ref 150–400)
RBC: 3.91 MIL/uL (ref 3.87–5.11)
RDW: 13.1 % (ref 11.5–15.5)
RDW: 13.5 % (ref 11.5–15.5)
RDW: 13.6 % (ref 11.5–15.5)

## 2010-04-28 LAB — BASIC METABOLIC PANEL
BUN: 10 mg/dL (ref 6–23)
BUN: 14 mg/dL (ref 6–23)
CO2: 27 mEq/L (ref 19–32)
Calcium: 9 mg/dL (ref 8.4–10.5)
Chloride: 105 mEq/L (ref 96–112)
Chloride: 107 mEq/L (ref 96–112)
Chloride: 109 mEq/L (ref 96–112)
Creatinine, Ser: 0.91 mg/dL (ref 0.4–1.2)
Glucose, Bld: 135 mg/dL — ABNORMAL HIGH (ref 70–99)
Glucose, Bld: 97 mg/dL (ref 70–99)
Glucose, Bld: 98 mg/dL (ref 70–99)
Potassium: 3.9 mEq/L (ref 3.5–5.1)
Potassium: 4.4 mEq/L (ref 3.5–5.1)
Sodium: 138 mEq/L (ref 135–145)

## 2010-04-28 LAB — CARDIAC PANEL(CRET KIN+CKTOT+MB+TROPI)
CK, MB: 1.4 ng/mL (ref 0.3–4.0)
Relative Index: INVALID (ref 0.0–2.5)
Troponin I: 0.01 ng/mL (ref 0.00–0.06)

## 2010-04-28 LAB — URINALYSIS, ROUTINE W REFLEX MICROSCOPIC
Bilirubin Urine: NEGATIVE
Hgb urine dipstick: NEGATIVE
Protein, ur: NEGATIVE mg/dL
Urobilinogen, UA: 0.2 mg/dL (ref 0.0–1.0)

## 2010-04-28 LAB — URINE MICROSCOPIC-ADD ON

## 2010-04-28 LAB — APTT: aPTT: 32 seconds (ref 24–37)

## 2010-04-28 LAB — PROTIME-INR
INR: 2.51 — ABNORMAL HIGH (ref 0.00–1.49)
Prothrombin Time: 26.9 seconds — ABNORMAL HIGH (ref 11.6–15.2)
Prothrombin Time: 30.3 seconds — ABNORMAL HIGH (ref 11.6–15.2)

## 2010-04-29 ENCOUNTER — Encounter: Payer: Self-pay | Admitting: Internal Medicine

## 2010-04-29 ENCOUNTER — Ambulatory Visit (INDEPENDENT_AMBULATORY_CARE_PROVIDER_SITE_OTHER): Payer: 59 | Admitting: Internal Medicine

## 2010-04-29 VITALS — BP 114/72 | HR 61 | Resp 18 | Ht 67.0 in | Wt 162.8 lb

## 2010-04-29 DIAGNOSIS — I4891 Unspecified atrial fibrillation: Secondary | ICD-10-CM

## 2010-04-29 LAB — BASIC METABOLIC PANEL
BUN: 18 mg/dL (ref 6–23)
Chloride: 105 mEq/L (ref 96–112)
Creatinine, Ser: 1.09 mg/dL (ref 0.4–1.2)
Glucose, Bld: 177 mg/dL — ABNORMAL HIGH (ref 70–99)
Potassium: 3.9 mEq/L (ref 3.5–5.1)

## 2010-04-29 LAB — CBC
MCV: 98.6 fL (ref 78.0–100.0)
Platelets: 190 10*3/uL (ref 150–400)
WBC: 9.7 10*3/uL (ref 4.0–10.5)

## 2010-04-29 LAB — PROTIME-INR
INR: 1.98 — ABNORMAL HIGH (ref 0.00–1.49)
Prothrombin Time: 22.3 seconds — ABNORMAL HIGH (ref 11.6–15.2)

## 2010-04-29 LAB — POCT CARDIAC MARKERS

## 2010-04-29 MED ORDER — NADOLOL 20 MG PO TABS: ORAL_TABLET | ORAL | Status: DC

## 2010-04-29 NOTE — Assessment & Plan Note (Signed)
Doing well, without any further afib post ablation. I have reviewed her ekg today which reveals sinus rhythm with PACs. I also reviewed her event monitor which she wore 2/9-3/0/12 which revealed pacs/ pvcs but no afib.  She will continue ASA 325mg  daily Stop cardizem Try low dose nadolol (10mg  daily) for pacs/pvcs and migraines

## 2010-04-29 NOTE — Patient Instructions (Signed)
Your physician recommends that you schedule a follow-up appointment in: 6 MONTHS WITH DR [facility] Your physician has recommended you make the following change in your medication: STOP CARDIZEM  AND START NADOLOL 10 MG EVERY DAY

## 2010-04-29 NOTE — Progress Notes (Signed)
The patient presents today for routine electrophysiology followup.  Since last being seen in our clinic she reports doing very well.  Today, she denies symptoms of chest pain, shortness of breath, orthopnea, PND, lower extremity edema, dizziness, presyncope, syncope, or neurologic sequela. She reports occasional palpitations but denies any sustained afib.  The patient feels that she is tolerating medications without difficulties and is otherwise without complaint today.   Past Medical History  Diagnosis Date  . Atrial fibrillation     s/p ablations  . Atrial flutter   . H/O: rheumatic fever 1957  . COPD (chronic obstructive pulmonary disease)   . Depression   . Migraine    Past Surgical History  Procedure Date  . Knee surgery [phone]  . Total abdominal hysterectomy 1990  . Rhinoplasty age 59  . Pvi 2/11 & 6/11  . Cti ablation 3/11    Current outpatient prescriptions:ALPRAZolam (XANAX) 0.25 MG tablet, Take 0.25 mg by mouth at bedtime as needed.  , Disp: , Rfl: ;  buPROPion (WELLBUTRIN XL) 300 MG 24 hr tablet, Take 300 mg by mouth daily.  , Disp: , Rfl: ;  butalbital-acetaminophen-caffeine (FIORICET) 50-325-40 MG per tablet, Take 1 tablet by mouth 2 (two) times daily as needed.  , Disp: , Rfl:  diltiazem (CARDIZEM CD) 240 MG 24 hr capsule, Take 240 mg by mouth daily.  , Disp: , Rfl: ;  promethazine (PHENERGAN) 25 MG tablet, Take 25 mg by mouth every 6 (six) hours as needed.  , Disp: , Rfl: ;  sertraline (ZOLOFT) 100 MG tablet, Take 100 mg by mouth daily.  , Disp: , Rfl: ;  SUMAtriptan (IMITREX) 100 MG tablet, Take 100 mg by mouth as directed.  , Disp: , Rfl:  estradiol (ESTRACE) 0.1 MG/GM vaginal cream, Place 2 g vaginally as directed.  , Disp: , Rfl:   Allergies  Allergen Reactions  . Ciprofloxacin   . Erythromycin     REACTION: rash    History   Social History  . Marital Status: Married    Spouse Name: N/A    Number of Children: N/A  . Years of Education: N/A    Occupational History  . Animator of nursing - [employer]   Social History Main Topics  . Smoking status: Current Everyday Smoker -- 1.0 packs/day for 40 years  . Smokeless tobacco: Not on file  . Alcohol Use: Yes     1-2 glasses of wine per year  . Drug Use: Not on file  . Sexually Active: Not on file   Other Topics Concern  . Not on file   Social History Narrative  . No narrative on file    Family History  Problem Relation Age of Onset  . Cancer Mother     renal cell  . Dementia Father     ROS-  All systems are reviewed and are negative except as outlined in the HPI above   Physical Exam: Filed Vitals:   04/29/10 0951  BP: 114/72  Pulse: 61  Resp: 18  Height: 5\' 7"  (1.702 m)  Weight: 162 lb 12.8 oz (73.846 kg)    GEN- The patient is well appearing, alert and oriented x 3 today.   Head- normocephalic, atraumatic Eyes-  Sclera clear, conjunctiva pink Ears- hearing intact Oropharynx- clear Neck- supple, no JVP Lymph- no cervical lymphadenopathy Lungs- Clear to ausculation bilaterally, normal work of breathing Heart- Regular rate and rhythm, no murmurs, rubs or gallops, PMI not laterally displaced  GI- soft, NT, ND, + BS Extremities- no clubbing, cyanosis, or edema MS- no significant deformity or atrophy Skin- no rash or lesion Psych- euthymic mood, full affect Neuro- strength and sensation are intact

## 2010-05-03 ENCOUNTER — Encounter: Payer: Self-pay | Admitting: Internal Medicine

## 2010-05-07 NOTE — Procedures (Signed)
Summary: Summary Report  Summary Report   Imported By: Erle Crocker 05/03/2010 10:37:13  _____________________________________________________________________  External Attachment:    Type:   Image     Comment:   External Document

## 2010-05-16 ENCOUNTER — Ambulatory Visit (HOSPITAL_COMMUNITY)
Admission: RE | Admit: 2010-05-16 | Discharge: 2010-05-16 | Disposition: A | Discharge status: Home / Self Care | Payer: 59 | Source: Ambulatory Visit | Attending: Obstetrics and Gynecology | Admitting: Obstetrics and Gynecology

## 2010-05-16 DIAGNOSIS — Z1231 Encounter for screening mammogram for malignant neoplasm of breast: Secondary | ICD-10-CM | POA: Insufficient documentation

## 2010-06-21 NOTE — Op Note (Signed)
   NAME:  Suzanne [name], Suzanne [name]                       ACCOUNT NO.:  [id number]   MEDICAL RECORD NO.:  [id number]                   PATIENT TYPE:  AMB   LOCATION:  ENDO                                 FACILITY:  [facility]   PHYSICIAN:  Graylin Shiver, M.D.                DATE OF BIRTH:  04/11/1950   DATE OF PROCEDURE:  09/22/2002  DATE OF DISCHARGE:                                 OPERATIVE REPORT   PROCEDURE:  Colonoscopy.   INDICATIONS:  Screening.   Informed consent was obtained after explanation of the risks of bleeding,  infection, and perforation.   PREMEDICATION:  Because of a history of rheumatic fever in the past, the  patient was given ampicillin 2 g IV and gentamicin 60 mg IV Preprocedure.  She was also given fentanyl 50 mcg IV, Versed 5 mg IV.   DESCRIPTION OF PROCEDURE:  With the patient in the left lateral decubitus  position, a rectal exam was performed and no masses were felt.  The Olympus  colonoscope was inserted into the rectum and advanced around the colon to  the cecum.  Cecal landmarks were identified.  The cecum and ascending colon  were normal.  The transverse colon normal.  The descending colon, sigmoid,  and rectum were normal.  She tolerated the procedure well without  complications.   IMPRESSION:  Normal colonoscopy to the cecum.                                               Graylin Shiver, M.D.    SFG/MEDQ  D:  09/22/2002  T:  09/22/2002  Job:  [id number]   cc:   Caryn Bee L. Little, M.D.  [address]  [place]  [place] [address]  Fax: [phone]

## 2010-06-21 NOTE — Op Note (Signed)
[name], Suzanne [name]             ACCOUNT NO.:  [id number]   MEDICAL RECORD NO.:  [id number]          PATIENT TYPE:  AMB   LOCATION:  DSC                          FACILITY:  [facility]   PHYSICIAN:  Robert A. Thurston Hole, M.D. DATE OF BIRTH:  04-18-1950   DATE OF PROCEDURE:  10/21/2004  DATE OF DISCHARGE:                                 OPERATIVE REPORT   PREOPERATIVE DIAGNOSIS:  Left knee medial and lateral meniscal tears with  chondromalacia and synovitis.   POSTOPERATIVE DIAGNOSIS:  Left knee medial and lateral meniscal tears with  chondromalacia and synovitis.   OPERATION PERFORMED:  1.  Left knee examination under anesthesia followed by arthroscopic partial      and lateral meniscectomies.  2.  Left knee chondroplasty with partial synovectomy.   SURGEON:  Elana Alm. Thurston Hole, M.D.   ANESTHESIA:  Local and MAC.   OPERATIVE TIME:  30 minutes.   COMPLICATIONS:  None.   INDICATIONS FOR PROCEDURE:  Suzanne [name] is a 60 year old woman who has had  five to six months of increasing left knee pain with exam and MRI  documenting meniscal tearing with chondromalacia and synovitis, who has  failed conservative care and is now to undergo arthroscopy.   DESCRIPTION OF PROCEDURE:  Suzanne [name]  was brought to the operating room  on October 21, 2004 after a knee block had been placed in the holding room  by anesthesia.  She was placed on the operating table in supine position.  Her left knee was examined.  Range of motion from 0 to 125 degrees, 1 to 2+  crepitation, knee stable to ligamentous exam with normal patellar tracking.  The left leg was prepped using sterile DuraPrep and draped using sterile  technique.  Originally through an anterior and lateral portal, the  arthroscope with a pump attached was placed and through an anterior medial  portal, an arthroscopic probe was placed.  On initial inspection of the  medial compartment, she was found to have 50% grade 3 chondromalacia which  was debrided, medial meniscal tear of posterior and medial horn of which 40  to 50% was resected back to a stable rim.  The intercondylar notch was  inspected, anterior and posterior cruciate ligaments were normal.  Lateral  compartment inspected.  She had a remnant of the previous open meniscectomy  of which only 10 to 20% of her lateral meniscus remained and another 30 to  40% of this was torn which was debrided.  She had 30% grade 4 and the rest  grade 3 chondromalacia and degenerative joint disease in the lateral  compartment and this was debrided.  The patellofemoral joint showed only  mild grade 1 and 2 chondromalacia.  The patella tracked normally.  Moderate  synovitis in the medial and lateral gutters were debrided.  Otherwise they  were free of pathology.  After this was done, it was felt that all pathology  had been satisfactorily addressed.  The instruments were removed.  Ports  were closed with 3-0 nylon suture and injected with 0.25% Marcaine with  epinephrine and 4 mg of morphine.  Sterile dressing was  applied.  The  patient was awakened and taken to recovery room in stable condition.   FOLLOW UP:  Suzanne [name] will be followed as an outpatient on Vicodin,  ibuprofen and Phenergan.  See her back in the office in a week for suture  removal and follow-up.      Robert A. Thurston Hole, M.D.  Electronically Signed     RAW/MEDQ  D:  10/21/2004  T:  10/21/2004  Job:  [id number]

## 2010-06-27 IMAGING — CR DG CHEST 1V PORT
1 series · 1 of 1 positions shown · non-contrast
Comparison: None

CLINICAL DATA: Dizziness and shortness of breath.

PORTABLE CHEST - 1 VIEW

[view not recorded]
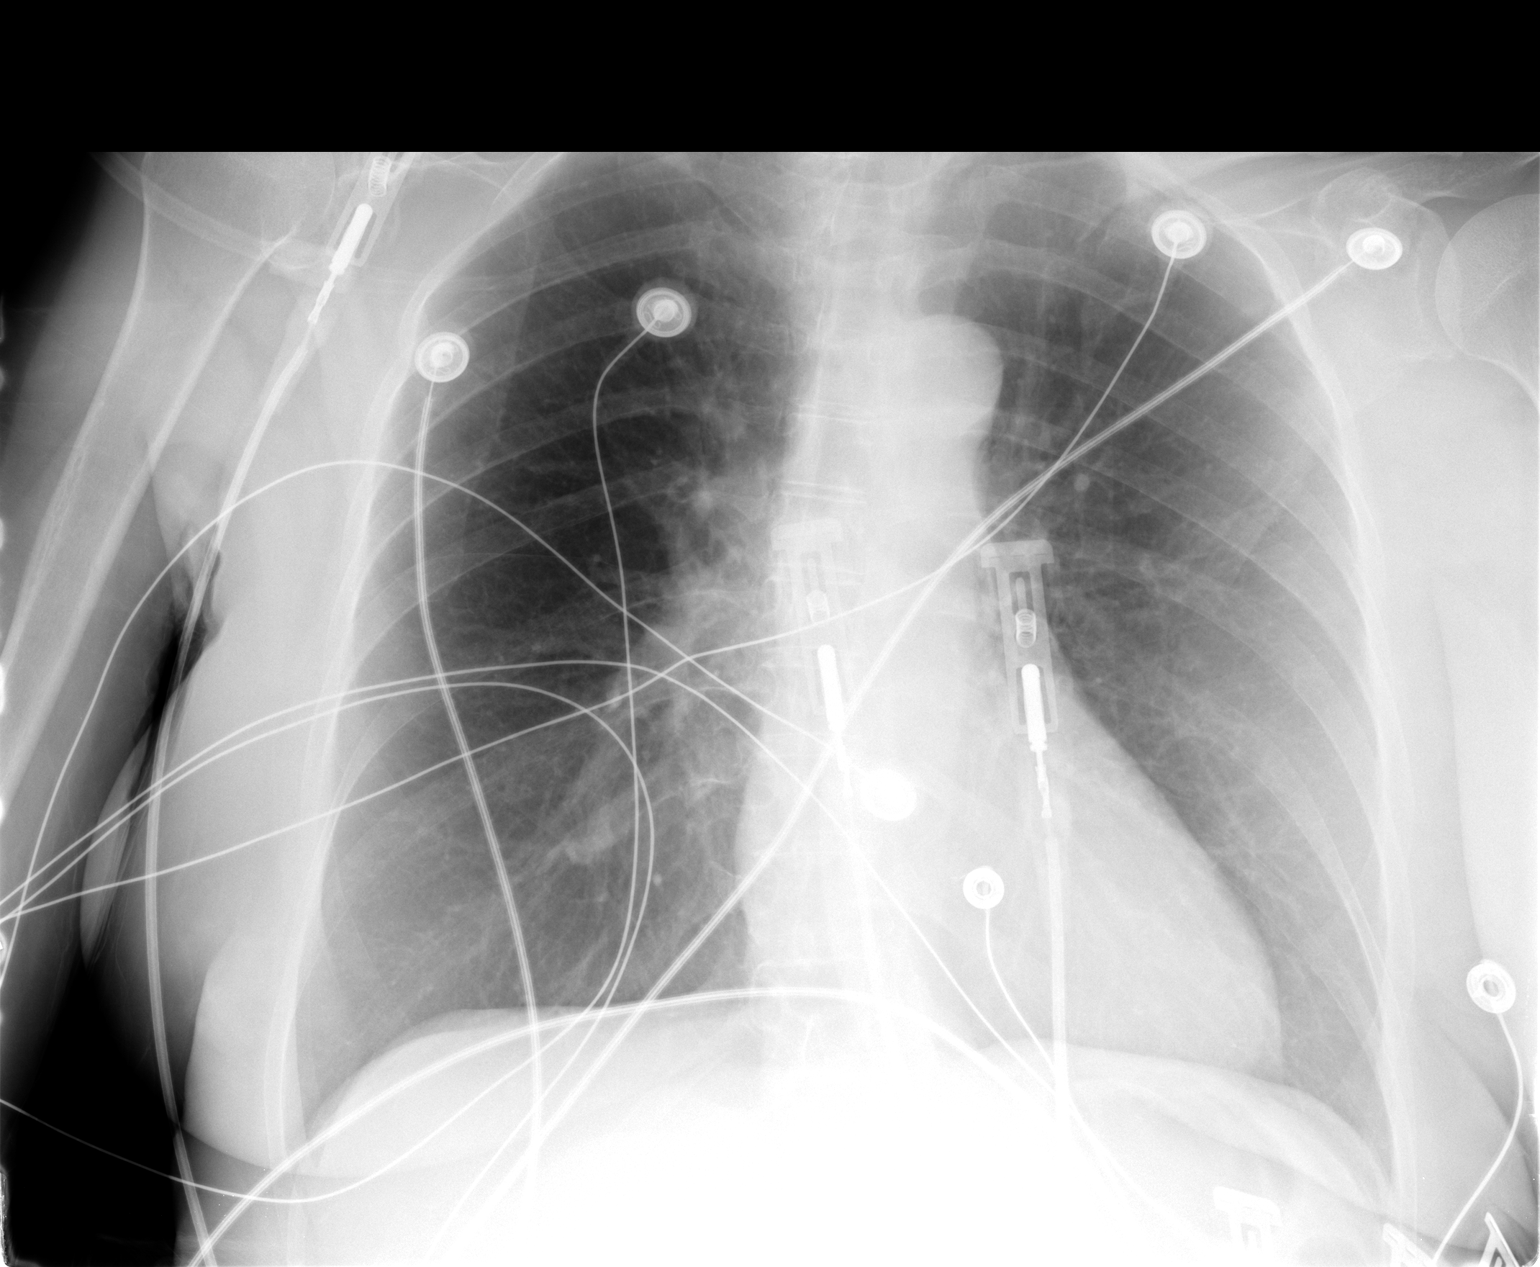

[1 of 1 positions shown; findings below may reference images not displayed]

FINDINGS: The cardiac silhouette, mediastinal and hilar contours
are within normal limits.  The lungs are clear.  The bony thorax is
intact.
IMPRESSION: No acute cardiopulmonary findings.

## 2010-10-09 ENCOUNTER — Ambulatory Visit (INDEPENDENT_AMBULATORY_CARE_PROVIDER_SITE_OTHER): Payer: 59 | Admitting: Internal Medicine

## 2010-10-09 ENCOUNTER — Encounter: Payer: Self-pay | Admitting: Internal Medicine

## 2010-10-09 VITALS — BP 114/70 | HR 60 | Ht 67.0 in | Wt 160.8 lb

## 2010-10-09 DIAGNOSIS — I4891 Unspecified atrial fibrillation: Secondary | ICD-10-CM

## 2010-10-09 MED ORDER — DILTIAZEM HCL 30 MG PO TABS: ORAL_TABLET | ORAL | Status: DC

## 2010-10-09 NOTE — Assessment & Plan Note (Signed)
Doing well, without any further afib post ablation. I have reviewed her ekg today which reveals sinus rhythm with PACs. I also reviewed her event monitor which she wore 2/9-3/0/12 which revealed pacs/ pvcs but no afib. Doing well s/p ablation without recurrence of afib off all AAD She did not tolerate nadolol due to cough She will continue ASA 325mg  daily She will take cardizem only as needed

## 2010-10-09 NOTE — Patient Instructions (Signed)
Your physician has recommended you make the following change in your medication:  1) Start Cardizem 30mg  one tablet by mouth every 8 hours as needed.  Your physician wants you to follow-up in: 6 months. You will receive a reminder letter in the mail two months in advance. If you don't receive a letter, please call our office to schedule the follow-up appointment.

## 2010-10-09 NOTE — Progress Notes (Signed)
The patient presents today for routine electrophysiology followup.  Since last being seen in our clinic she reports doing very well.  Today, she denies symptoms of chest pain, shortness of breath, orthopnea, PND, lower extremity edema, dizziness, presyncope, syncope, or neurologic sequela. She reports occasional palpitations but denies any sustained afib.  The patient feels that she is tolerating medications without difficulties and is otherwise without complaint today.   Past Medical History  Diagnosis Date  . Atrial fibrillation     s/p ablations  . Atrial flutter   . H/O: rheumatic fever 1957  . COPD (chronic obstructive pulmonary disease)   . Depression   . Migraine    Past Surgical History  Procedure Date  . Knee surgery [phone]  . Total abdominal hysterectomy 1990  . Rhinoplasty age 31  . Pvi 2/11 & 6/11  . Cti ablation 3/11    Current outpatient prescriptions:ALPRAZolam (XANAX) 0.25 MG tablet, Take 0.25 mg by mouth at bedtime as needed.  , Disp: , Rfl: ;  aspirin 325 MG EC tablet, Take 325 mg by mouth daily.  , Disp: , Rfl: ;  buPROPion (WELLBUTRIN XL) 300 MG 24 hr tablet, Take 300 mg by mouth daily.  , Disp: , Rfl: ;  butalbital-acetaminophen-caffeine (FIORICET) 50-325-40 MG per tablet, Take 1 tablet by mouth 2 (two) times daily as needed.  , Disp: , Rfl:  promethazine (PHENERGAN) 25 MG tablet, Take 25 mg by mouth every 6 (six) hours as needed.  , Disp: , Rfl: ;  sertraline (ZOLOFT) 100 MG tablet, Take 100 mg by mouth daily.  , Disp: , Rfl: ;  SUMAtriptan (IMITREX) 100 MG tablet, Take 100 mg by mouth as directed.  , Disp: , Rfl: ;  diltiazem (CARDIZEM) 30 MG tablet, Take one tablet by mouth every 8 hours as needed, Disp: 90 tablet, Rfl: 2 estradiol (ESTRACE) 0.1 MG/GM vaginal cream, Place 2 g vaginally as directed.  , Disp: , Rfl: ;  nadolol (CORGARD) 20 MG tablet, 1/2 TAB EVERY DAY, Disp: 15 tablet, Rfl: 11  Allergies  Allergen Reactions  . Ciprofloxacin   .  Erythromycin     REACTION: rash    History   Social History  . Marital Status: Married    Spouse Name: N/A    Number of Children: N/A  . Years of Education: N/A   Occupational History  . 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 Smoking status: Current Everyday Smoker -- 1.0 packs/day for 40 years  . Smokeless tobacco: Not on file  . Alcohol Use: Yes     1-2 glasses of wine per year  . Drug Use: Not on file  . Sexually Active: Not on file   Other Topics Concern  . Not on file   Social History Narrative  . No narrative on file    Family History  Problem Relation Age of Onset  . Cancer Mother     renal cell  . Dementia Father     Physical Exam: Filed Vitals:   10/09/10 1125  BP: 114/70  Pulse: 60  Height: 5\' 7"  (1.702 m)  Weight: 160 lb 12 oz (72.916 kg)    GEN- The patient is well appearing, alert and oriented x 3 today.   Head- normocephalic, atraumatic Eyes-  Sclera clear, conjunctiva pink Ears- hearing intact Oropharynx- clear Neck- supple, no JVP Lymph- no cervical lymphadenopathy Lungs- Clear to ausculation bilaterally, normal work of breathing Heart- Regular  rate and rhythm, no murmurs, rubs or gallops, PMI not laterally displaced GI- soft, NT, ND, + BS Extremities- no clubbing, cyanosis, or edema MS- no significant deformity or atrophy Skin- no rash or lesion Psych- euthymic mood, full affect Neuro- strength and sensation are intact

## 2011-05-28 ENCOUNTER — Other Ambulatory Visit (HOSPITAL_COMMUNITY): Payer: Self-pay | Admitting: Obstetrics and Gynecology

## 2011-05-28 DIAGNOSIS — Z1231 Encounter for screening mammogram for malignant neoplasm of breast: Secondary | ICD-10-CM

## 2011-06-16 ENCOUNTER — Ambulatory Visit (HOSPITAL_COMMUNITY): Payer: 59

## 2011-07-09 ENCOUNTER — Ambulatory Visit (HOSPITAL_COMMUNITY)
Admission: RE | Admit: 2011-07-09 | Discharge: 2011-07-09 | Disposition: A | Discharge status: Home / Self Care | Payer: 59 | Source: Ambulatory Visit | Attending: Obstetrics and Gynecology | Admitting: Obstetrics and Gynecology

## 2011-07-09 DIAGNOSIS — Z1231 Encounter for screening mammogram for malignant neoplasm of breast: Secondary | ICD-10-CM | POA: Insufficient documentation

## 2011-11-28 ENCOUNTER — Other Ambulatory Visit (HOSPITAL_COMMUNITY): Payer: Self-pay | Admitting: Orthopedic Surgery

## 2011-11-28 DIAGNOSIS — M75100 Unspecified rotator cuff tear or rupture of unspecified shoulder, not specified as traumatic: Secondary | ICD-10-CM

## 2011-11-29 ENCOUNTER — Ambulatory Visit (HOSPITAL_COMMUNITY)
Admission: RE | Admit: 2011-11-29 | Discharge: 2011-11-29 | Disposition: A | Discharge status: Home / Self Care | Payer: 59 | Source: Ambulatory Visit | Attending: Orthopedic Surgery | Admitting: Orthopedic Surgery

## 2011-11-29 DIAGNOSIS — M719 Bursopathy, unspecified: Secondary | ICD-10-CM | POA: Insufficient documentation

## 2011-11-29 DIAGNOSIS — M75100 Unspecified rotator cuff tear or rupture of unspecified shoulder, not specified as traumatic: Secondary | ICD-10-CM

## 2011-11-29 DIAGNOSIS — M67919 Unspecified disorder of synovium and tendon, unspecified shoulder: Secondary | ICD-10-CM | POA: Insufficient documentation

## 2011-11-29 DIAGNOSIS — M25519 Pain in unspecified shoulder: Secondary | ICD-10-CM | POA: Insufficient documentation

## 2012-04-28 ENCOUNTER — Ambulatory Visit: Payer: Self-pay | Admitting: Neurology

## 2012-04-28 ENCOUNTER — Ambulatory Visit (INDEPENDENT_AMBULATORY_CARE_PROVIDER_SITE_OTHER): Payer: 59 | Admitting: Neurology

## 2012-04-28 ENCOUNTER — Encounter: Payer: Self-pay | Admitting: Neurology

## 2012-04-28 VITALS — BP 108/61 | HR 64 | Ht 64.0 in | Wt 160.0 lb

## 2012-04-28 DIAGNOSIS — G43709 Chronic migraine without aura, not intractable, without status migrainosus: Secondary | ICD-10-CM

## 2012-04-28 DIAGNOSIS — G43909 Migraine, unspecified, not intractable, without status migrainosus: Secondary | ICD-10-CM

## 2012-04-28 MED ORDER — SUMATRIPTAN SUCCINATE 6 MG/0.5ML ~~LOC~~ SOLN: 6.0000 mg | SUBCUTANEOUS | Status: DC | PRN

## 2012-04-28 MED ORDER — RIZATRIPTAN BENZOATE 10 MG PO TBDP: 10.0000 mg | ORAL_TABLET | ORAL | Status: DC | PRN

## 2012-04-28 MED ORDER — ONABOTULINUMTOXINA 100 UNITS IJ SOLR
200.0000 [IU] | Freq: Once | INTRAMUSCULAR | Status: AC
  Administered 2012-04-28: 200 [IU] via INTRAMUSCULAR

## 2012-04-28 NOTE — Progress Notes (Signed)
Suzanne [name] is a 62 years old right-handed Caucasian female, referred by Dr. Anne Hahn for evaluation of Botox injection as migraine prevention.  She had a past medical history of atrial fibrillation, atrial flutter, status post ablation,  She reported a lifelong history of migraine headaches since her early twenties, trigger for her migraines weather changes, MSG, perfume, over the past decade, her migraine headaches has much improved, she has much less headaches, but still 3-4 times each week, her migraine headaches often woke her up from sleep, severe lateralized pounding headaches, with associated light, noise sensitivity, she also complains of nausea,  Imitrex 100 mg as needed has been very helpful, but she is also taking Fioricet 4-5 times a week, sometimes Excedrin Migraine,  Over the years, she has tried different preventive medication without effectively control of migraine headaches, beta blocker, metoprolol, causing fatigue, Topamax, cause confusion, mental slowness, Cardizem, calcium channel blocker, slow heart rate, no effect,  She is currently taking promethazine as needed, Imitrex as needed, Fioricet as needed. For depression, she is taking Wellbutrin, Zoloft, alprazolam.  She denies lateralized motor or sensory deficit, no visual change,  Question about potential side effect of botulinumtoxin was answered  PHYSICAL EXAMINATOINS:  Generalized: In no acute distress  Neck: Supple, no carotid bruits   Cardiac: Regular rate rhythm  Pulmonary: Clear to auscultation bilaterally  Musculoskeletal: No deformity  Neurological examination  Mentation: Alert oriented to time, place, history taking, and causual conversation  Cranial nerve II-XII: Pupils were equal round reactive to light extraocular movements were full, visual field were full on confrontational test. facial sensation and strength were normal. hearing was intact to finger rubbing bilaterally. Uvula tongue midline.   head turning and shoulder shrug and were normal and symmetric.Tongue protrusion into cheek strength was normal.  Motor: normal tone, bulk and strength.  Sensory: Intact to fine touch, pinprick, preserved vibratory sensation, and proprioception at toes.  Coordination: Normal finger to nose, heel-to-shin bilaterally there was no truncal ataxia  Gait: Rising up from seated position without assistance, normal stance, without trunk ataxia, moderate stride, good arm swing, smooth turning, able to perform tiptoe, and heel walking without difficulty.   Romberg signs: Negative  Deep tendon reflexes: Brachioradialis 2/2, biceps 2/2, triceps 2/2, patellar 2/2, Achilles 2/2, plantar responses were flexor bilaterally.   Assessment and plan, BOTOX injection was performed according to protocol by [employer]. 100 units of BOTOX was dissolved into 2 cc NS. (Lot [id number]).  total of 155 units, discard 45 units.  Corrugator 2 sites, 10 units Procerus 1 site, 5 unit Frontalis 4 sites,  20 units, Temporalis 8 sites,  40 units  Occipitalis 6 sites, 30 units Cervical Paraspinal, 4 sites, 20 units Trapezius, 6 sites, 30 units  Patient tolerate the injection well. Will return for repeat injection in 3 months.

## 2012-05-05 ENCOUNTER — Ambulatory Visit: Payer: Self-pay | Admitting: Neurology

## 2012-07-20 ENCOUNTER — Other Ambulatory Visit (HOSPITAL_COMMUNITY): Payer: Self-pay | Admitting: Obstetrics and Gynecology

## 2012-07-20 DIAGNOSIS — Z1231 Encounter for screening mammogram for malignant neoplasm of breast: Secondary | ICD-10-CM

## 2012-08-11 ENCOUNTER — Ambulatory Visit (INDEPENDENT_AMBULATORY_CARE_PROVIDER_SITE_OTHER): Payer: 59 | Admitting: Neurology

## 2012-08-11 ENCOUNTER — Encounter: Payer: Self-pay | Admitting: Neurology

## 2012-08-11 VITALS — BP 119/71 | HR 77 | Wt 160.0 lb

## 2012-08-11 DIAGNOSIS — G43909 Migraine, unspecified, not intractable, without status migrainosus: Secondary | ICD-10-CM

## 2012-08-11 DIAGNOSIS — G43719 Chronic migraine without aura, intractable, without status migrainosus: Secondary | ICD-10-CM

## 2012-08-11 MED ORDER — ONABOTULINUMTOXINA 100 UNITS IJ SOLR
150.0000 [IU] | Freq: Once | INTRAMUSCULAR | Status: AC
  Administered 2012-08-11: 150 [IU] via INTRAMUSCULAR

## 2012-08-11 NOTE — Progress Notes (Signed)
Suzanne [name] is a 62 years old right-handed Caucasian female, referred by Dr. Anne Hahn for evaluation of Botox injection as migraine prevention.  She had a past medical history of atrial fibrillation, atrial flutter, status post ablation,  She reported a lifelong history of migraine headaches since her early twenties, trigger for her migraines weather changes, MSG, perfume, over the past decade, her migraine headaches has much improved, she has much less headaches, but still 3-4 times each week, her migraine headaches often woke her up from sleep, severe lateralized pounding headaches, with associated light, noise sensitivity, she also complains of nausea,  Imitrex 100 mg as needed has been very helpful, but she is also taking Fioricet 4-5 times a week, sometimes Excedrin Migraine,  Over the years, she has tried different preventive medication without effectively control of migraine headaches, beta blocker, metoprolol, causing fatigue, Topamax, cause confusion, mental slowness, Cardizem, calcium channel blocker, slow heart rate, no effect,  She is currently taking promethazine as needed, Imitrex as needed, Fioricet as needed. For depression, she is taking Wellbutrin, Zoloft, alprazolam.  She denies lateralized motor or sensory deficit, no visual change,  Question about potential side effect of botulinumtoxin was answered.  UPDATE July 9th 2014: She denies significant side effect from Botox injection, she continues to complain of frequent headaches, especially with bariatric change, Maxalt has been very helpful.  PHYSICAL EXAMINATOINS:  Generalized: In no acute distress  Neck: Supple, no carotid bruits   Cardiac: Regular rate rhythm  Pulmonary: Clear to auscultation bilaterally  Musculoskeletal: No deformity  Neurological examination  Mentation: Alert oriented to time, place, history taking, and causual conversation  Cranial nerve II-XII: Pupils were equal round reactive to light  extraocular movements were full, visual field were full on confrontational test. facial sensation and strength were normal. hearing was intact to finger rubbing bilaterally. Uvula tongue midline.  head turning and shoulder shrug and were normal and symmetric.Tongue protrusion into cheek strength was normal.  Motor: normal tone, bulk and strength.  Sensory: Intact to fine touch, pinprick, preserved vibratory sensation, and proprioception at toes.  Coordination: Normal finger to nose, heel-to-shin bilaterally there was no truncal ataxia  Gait: Rising up from seated position without assistance, normal stance, without trunk ataxia, moderate stride, good arm swing, smooth turning, able to perform tiptoe, and heel walking without difficulty.   Romberg signs: Negative  Deep tendon reflexes: Brachioradialis 2/2, biceps 2/2, triceps 2/2, patellar 2/2, Achilles 2/2, plantar responses were flexor bilaterally.   Assessment and plan, BOTOX injection was performed according to protocol by [employer]. 100 units of BOTOX was dissolved into 2 cc NS. (Lot [id number], exp Jan 2017)  total of 150 units.  Corrugator 2 sites, 10 units Procerus 1 site, 5 unit Frontalis 4 sites,  20 units, Temporalis 8 sites,  40 units  Occipitalis 6 sites, 30 units Cervical Paraspinal, 4 sites, 20 units Trapezius, 6 sites, 30 units  Patient tolerate the injection well. Will return for repeat injection in 3 months.

## 2012-08-13 ENCOUNTER — Ambulatory Visit (HOSPITAL_COMMUNITY)
Admission: RE | Admit: 2012-08-13 | Discharge: 2012-08-13 | Disposition: A | Discharge status: Home / Self Care | Payer: 59 | Source: Ambulatory Visit | Attending: Obstetrics and Gynecology | Admitting: Obstetrics and Gynecology

## 2012-08-13 DIAGNOSIS — Z1231 Encounter for screening mammogram for malignant neoplasm of breast: Secondary | ICD-10-CM | POA: Insufficient documentation

## 2012-08-16 ENCOUNTER — Ambulatory Visit: Payer: Self-pay | Admitting: Neurology

## 2012-09-24 ENCOUNTER — Encounter: Payer: Self-pay | Admitting: Neurology

## 2012-09-27 ENCOUNTER — Ambulatory Visit (INDEPENDENT_AMBULATORY_CARE_PROVIDER_SITE_OTHER): Payer: 59 | Admitting: Neurology

## 2012-09-27 ENCOUNTER — Encounter: Payer: Self-pay | Admitting: Neurology

## 2012-09-27 VITALS — BP 121/70 | HR 73 | Ht 67.75 in | Wt 160.0 lb

## 2012-09-27 DIAGNOSIS — G43909 Migraine, unspecified, not intractable, without status migrainosus: Secondary | ICD-10-CM

## 2012-09-27 MED ORDER — BUTALBITAL-APAP-CAFFEINE 50-325-40 MG PO TABS: 1.0000 | ORAL_TABLET | Freq: Four times a day (QID) | ORAL | Status: DC | PRN

## 2012-09-27 NOTE — Progress Notes (Signed)
Reason for visit: Headache  Suzanne [name] is an 62 y.o. female  History of present illness:  Suzanne [name] is a 62 year old right-handed white female with a history of migraine headaches. The patient has had intractable headaches in the past, but she indicates that since starting Botox injections, her headache frequency and severity have dramatically decreased. Twice a week, the patient may get a bandlike sensation around the head that may last throughout the day. The patient will note that Maxalt will help his headache. The patient is having fewer severe headaches, and she is not missing work as frequently. The patient takes Fioricet on occasion, and Maxalt. The patient has had an occasional episode of brief dizziness associated with the use of Zoloft. The patient returns for an evaluation.  Past Medical History  Diagnosis Date  . Atrial fibrillation     s/p ablations  . Atrial flutter   . H/O: rheumatic fever 1957  . COPD (chronic obstructive pulmonary disease)   . Depression   . Migraine   . Frozen shoulder     Left  . History of asthma   . Degenerative arthritis     Past Surgical History  Procedure Laterality Date  . Knee surgery  [id number]  . Total abdominal hysterectomy  1990  . Rhinoplasty  age 52  . Pvi  2/11 & 6/11  . Cti ablation  3/11  . Tonsillectomy      Family History  Problem Relation Age of Onset  . Cancer Mother     renal cell  . Migraines Mother   . Dementia Father   . Heart disease Father     Social history:  reports that she has been smoking.  She does not have any smokeless tobacco history on file. She reports that  drinks alcohol. She reports that she does not use illicit drugs.    Allergies  Allergen Reactions  . Ciprofloxacin   . Erythromycin     REACTION: rash    Medications:  Current Outpatient Prescriptions on File Prior to Visit  Medication Sig Dispense Refill  . ALPRAZolam (XANAX) 0.25 MG tablet Take 0.25 mg by  mouth at bedtime as needed.        Marland Kitchen aspirin 81 MG tablet Take 81 mg by mouth daily.      Marland Kitchen buPROPion (WELLBUTRIN XL) 300 MG 24 hr tablet Take 300 mg by mouth daily.        . promethazine (PHENERGAN) 25 MG tablet Take 25 mg by mouth every 6 (six) hours as needed.        . rizatriptan (MAXALT-MLT) 10 MG disintegrating tablet Take 1 tablet (10 mg total) by mouth as needed for migraine. May repeat in 2 hours if needed  15 tablet  11  . sertraline (ZOLOFT) 100 MG tablet Take 100 mg by mouth daily.        . SUMAtriptan (IMITREX) 100 MG tablet Take 100 mg by mouth 2 (two) times daily as needed.       . SUMAtriptan (IMITREX) 6 MG/0.5ML SOLN injection Inject 0.5 mLs (6 mg total) into the skin every 2 (two) hours as needed for migraine or headache. F  6 vial  6   No current facility-administered medications on file prior to visit.    ROS:  Out of a complete 14 system review of symptoms, the patient complains only of the following symptoms, and all other reviewed systems are negative.  Headache Dizziness  Blood pressure 121/70, pulse 73, height 5'  7.75" (1.721 m), weight 160 lb (72.576 kg).  Physical Exam  General: The patient is alert and cooperative at the time of the examination.  Skin: No significant peripheral edema is noted.   Neurologic Exam  Cranial nerves: Facial symmetry is present. Speech is normal, no aphasia or dysarthria is noted. Extraocular movements are full. Visual fields are full.  Motor: The patient has good strength in all 4 extremities.  Coordination: The patient has good finger-nose-finger and heel-to-shin bilaterally.  Gait and station: The patient has a normal gait. Tandem gait is normal. Romberg is negative. No drift is seen.  Reflexes: Deep tendon reflexes are symmetric.   Assessment/Plan:  1. Migraine headache  The patient is doing quite well with the use of Botox. The patient will be given another prescription for Fioricet today. The patient will  followup in 6-8 months. The patient indicates that her headaches are usually worse in the fall and winter months.  Marlan Palau MD 09/27/2012 7:17 PM  [facility] [address] [place], [place] [address]  Phone [phone] Fax [phone]

## 2012-11-17 ENCOUNTER — Ambulatory Visit (INDEPENDENT_AMBULATORY_CARE_PROVIDER_SITE_OTHER): Payer: 59 | Admitting: Neurology

## 2012-11-17 ENCOUNTER — Encounter: Payer: Self-pay | Admitting: Neurology

## 2012-11-17 VITALS — BP 102/61 | HR 72 | Ht 67.0 in | Wt 164.0 lb

## 2012-11-17 DIAGNOSIS — G43909 Migraine, unspecified, not intractable, without status migrainosus: Secondary | ICD-10-CM

## 2012-11-17 DIAGNOSIS — G43719 Chronic migraine without aura, intractable, without status migrainosus: Secondary | ICD-10-CM

## 2012-11-17 MED ORDER — ONABOTULINUMTOXINA 100 UNITS IJ SOLR
150.0000 [IU] | Freq: Once | INTRAMUSCULAR | Status: AC
  Administered 2012-11-17: 150 [IU] via INTRAMUSCULAR

## 2012-11-17 NOTE — Progress Notes (Signed)
Suzanne [name] is a 62 years old right-handed Caucasian female, referred by Dr. Anne Hahn for evaluation of Botox injection as migraine prevention.  She had a past medical history of atrial fibrillation, atrial flutter, status post ablation,  She reported a lifelong history of migraine headaches since her early twenties, trigger for her migraines weather changes, MSG, perfume, over the past decade, her migraine headaches has much improved, she has much less headaches, but still 3-4 times each week, her migraine headaches often woke her up from sleep, severe lateralized pounding headaches, with associated light, noise sensitivity, she also complains of nausea,  Imitrex 100 mg as needed has been very helpful, but she is also taking Fioricet 4-5 times a week, sometimes Excedrin Migraine,  Over the years, she has tried different preventive medication without effectively control of migraine headaches, beta blocker, metoprolol, causing fatigue, Topamax, cause confusion, mental slowness, Cardizem, calcium channel blocker, slow heart rate, no effect,  She is currently taking promethazine as needed, Imitrex as needed, Fioricet as needed. For depression, she is taking Wellbutrin, Zoloft, alprazolam.  She denies lateralized motor or sensory deficit, no visual change,  Question about potential side effect of botulinumtoxin was answered.   UPDATE Oct 15th 2014: She denies significant side effect from Botox injection, BOTOX has been very helpful. she continues to complain of frequent headaches, especially with bariatric change, Maxalt has been very helpful.  PHYSICAL EXAMINATOINS:  Generalized: In no acute distress  Neck: Supple, no carotid bruits   Cardiac: Regular rate rhythm  Pulmonary: Clear to auscultation bilaterally  Musculoskeletal: No deformity  Neurological examination  Mentation: Alert oriented to time, place, history taking, and causual conversation  Cranial nerve II-XII: Pupils were  equal round reactive to light extraocular movements were full, visual field were full on confrontational test. facial sensation and strength were normal. hearing was intact to finger rubbing bilaterally. Uvula tongue midline.  head turning and shoulder shrug and were normal and symmetric.Tongue protrusion into cheek strength was normal.  Motor: normal tone, bulk and strength.  Sensory: Intact to fine touch, pinprick, preserved vibratory sensation, and proprioception at toes.  Coordination: Normal finger to nose, heel-to-shin bilaterally there was no truncal ataxia  Gait: Rising up from seated position without assistance, normal stance, without trunk ataxia, moderate stride, good arm swing, smooth turning, able to perform tiptoe, and heel walking without difficulty.   Romberg signs: Negative  Deep tendon reflexes: Brachioradialis 2/2, biceps 2/2, triceps 2/2, patellar 2/2, Achilles 2/2, plantar responses were flexor bilaterally.   Assessment and plan, BOTOX injection was performed according to protocol by [employer]er[employer]. 100 units of BOTOX was dissolved into 2 cc NS. (Lot No.[id number] May 2017)  Total of 150 units.  Corrugator 2 sites, 10 units Frontalis 4 sites,  20 units, Temporalis 8 sites,  40 units  Occipitalis 6 sites, 30 units Cervical Paraspinal, 4 sites, 20 units Trapezius, 6 sites, 30 units  Patient tolerate the injection well. Will return for repeat injection in 3 months.

## 2013-01-04 ENCOUNTER — Encounter (HOSPITAL_COMMUNITY): Payer: Self-pay | Admitting: *Deleted

## 2013-01-04 ENCOUNTER — Encounter (HOSPITAL_COMMUNITY): Payer: Self-pay | Admitting: Pharmacy Technician

## 2013-01-06 ENCOUNTER — Encounter (HOSPITAL_COMMUNITY)
Admission: RE | Disposition: A | Discharge status: Home / Self Care | Payer: Self-pay | Source: Ambulatory Visit | Attending: Gastroenterology

## 2013-01-06 ENCOUNTER — Other Ambulatory Visit: Payer: Self-pay | Admitting: Gastroenterology

## 2013-01-06 ENCOUNTER — Encounter (HOSPITAL_COMMUNITY): Payer: Self-pay

## 2013-01-06 ENCOUNTER — Ambulatory Visit (HOSPITAL_COMMUNITY): Payer: 59 | Admitting: Anesthesiology

## 2013-01-06 ENCOUNTER — Ambulatory Visit (HOSPITAL_COMMUNITY)
Admission: RE | Admit: 2013-01-06 | Discharge: 2013-01-06 | Disposition: A | Discharge status: Home / Self Care | Payer: 59 | Source: Ambulatory Visit | Attending: Gastroenterology | Admitting: Gastroenterology

## 2013-01-06 ENCOUNTER — Encounter (HOSPITAL_COMMUNITY): Payer: 59 | Admitting: Anesthesiology

## 2013-01-06 DIAGNOSIS — D131 Benign neoplasm of stomach: Secondary | ICD-10-CM | POA: Insufficient documentation

## 2013-01-06 DIAGNOSIS — J4489 Other specified chronic obstructive pulmonary disease: Secondary | ICD-10-CM | POA: Insufficient documentation

## 2013-01-06 DIAGNOSIS — R1013 Epigastric pain: Secondary | ICD-10-CM | POA: Insufficient documentation

## 2013-01-06 DIAGNOSIS — Z79899 Other long term (current) drug therapy: Secondary | ICD-10-CM | POA: Insufficient documentation

## 2013-01-06 DIAGNOSIS — K319 Disease of stomach and duodenum, unspecified: Secondary | ICD-10-CM | POA: Insufficient documentation

## 2013-01-06 DIAGNOSIS — J449 Chronic obstructive pulmonary disease, unspecified: Secondary | ICD-10-CM | POA: Insufficient documentation

## 2013-01-06 HISTORY — DX: Other specified postprocedural states: Z98.890

## 2013-01-06 HISTORY — PX: ESOPHAGOGASTRODUODENOSCOPY (EGD) WITH PROPOFOL: SHX5813

## 2013-01-06 HISTORY — DX: Nausea with vomiting, unspecified: R11.2

## 2013-01-06 SURGERY — ESOPHAGOGASTRODUODENOSCOPY (EGD) WITH PROPOFOL
Anesthesia: Monitor Anesthesia Care

## 2013-01-06 MED ORDER — PROPOFOL 10 MG/ML IV BOLUS
INTRAVENOUS | Status: AC
  Filled 2013-01-06: qty 20

## 2013-01-06 MED ORDER — SODIUM CHLORIDE 0.9 % IV SOLN: INTRAVENOUS | Status: DC

## 2013-01-06 MED ORDER — BUTAMBEN-TETRACAINE-BENZOCAINE 2-2-14 % EX AERO
INHALATION_SPRAY | CUTANEOUS | Status: DC | PRN
  Administered 2013-01-06: 2 via TOPICAL

## 2013-01-06 MED ORDER — KETAMINE HCL 50 MG/ML IJ SOLN
INTRAMUSCULAR | Status: AC
  Filled 2013-01-06: qty 10

## 2013-01-06 MED ORDER — KETAMINE HCL 50 MG/ML IJ SOLN
INTRAMUSCULAR | Status: DC | PRN
  Administered 2013-01-06: 25 mg via INTRAMUSCULAR

## 2013-01-06 MED ORDER — PROPOFOL 10 MG/ML IV BOLUS
INTRAVENOUS | Status: DC | PRN
  Administered 2013-01-06: 30 mg via INTRAVENOUS
  Administered 2013-01-06: 20 mg via INTRAVENOUS

## 2013-01-06 MED ORDER — FENTANYL CITRATE 0.05 MG/ML IJ SOLN: INTRAMUSCULAR | Status: DC | PRN

## 2013-01-06 MED ORDER — PROPOFOL INFUSION 10 MG/ML OPTIME
INTRAVENOUS | Status: DC | PRN
  Administered 2013-01-06: 100 ug/kg/min via INTRAVENOUS

## 2013-01-06 MED ORDER — LACTATED RINGERS IV SOLN
INTRAVENOUS | Status: DC
  Administered 2013-01-06: 10:00:00 via INTRAVENOUS

## 2013-01-06 SURGICAL SUPPLY — 15 items

## 2013-01-06 NOTE — Transfer of Care (Signed)
Immediate Anesthesia Transfer of Care Note  Patient: Suzanne [name]  Procedure(s) Performed: Procedure(s) (LRB): ESOPHAGOGASTRODUODENOSCOPY (EGD) WITH PROPOFOL (N/A)  Patient Location: PACU  Anesthesia Type: MAC  Level of Consciousness: sedated, patient cooperative and responds to stimulation  Airway & Oxygen Therapy: Patient Spontanous Breathing and Patient connected to face mask oxgen  Post-op Assessment: Report given to PACU RN and Post -op Vital signs reviewed and stable  Post vital signs: Reviewed and stable  Complications: No apparent anesthesia complications

## 2013-01-06 NOTE — Anesthesia Preprocedure Evaluation (Signed)
Anesthesia Evaluation  Patient identified by MRN, date of birth, ID band Patient awake    Reviewed: Allergy & Precautions, H&P , NPO status , Patient's Chart, lab work & pertinent test results  Airway Mallampati: II TM Distance: >3 FB Neck ROM: Full    Dental no notable dental hx.    Pulmonary COPDCurrent Smoker,  breath sounds clear to auscultation  Pulmonary exam normal       Cardiovascular negative cardio ROS  Rhythm:Regular Rate:Normal  Atrial fibrillation   s/p ablations    Neuro/Psych  Headaches, negative psych ROS   GI/Hepatic negative GI ROS, Neg liver ROS,   Endo/Other  negative endocrine ROS  Renal/GU negative Renal ROS  negative genitourinary   Musculoskeletal negative musculoskeletal ROS (+)   Abdominal   Peds negative pediatric ROS (+)  Hematology negative hematology ROS (+)   Anesthesia Other Findings   Reproductive/Obstetrics negative OB ROS                           Anesthesia Physical Anesthesia Plan  ASA: II  Anesthesia Plan: MAC   Post-op Pain Management:    Induction: Intravenous  Airway Management Planned: Nasal Cannula  Additional Equipment:   Intra-op Plan:   Post-operative Plan:   Informed Consent: I have reviewed the patients History and Physical, chart, labs and discussed the procedure including the risks, benefits and alternatives for the proposed anesthesia with the patient or authorized representative who has indicated his/her understanding and acceptance.   Dental advisory given  Plan Discussed with: CRNA and Surgeon  Anesthesia Plan Comments:         Anesthesia Quick Evaluation

## 2013-01-06 NOTE — H&P (Signed)
  This 62 year old female presents for endoscopic evaluation to the [facility] long endoscopy unit because of a roughly 3 month history of epigastric discomfort, while on aspirin and unresponsive to PPI therapy. No associated weight loss. Hemoccult negative as an outpatient.  Past medical history:  Allergies: Erythromycin and Cipro (rash)  Outpatient medications: MAC salt, Wellbutrin, sertraline, Imitrex, furosemide, Phenergan, Xanax, 81 mg daily aspirin, Nexium twice daily  Operations: Hysterectomy, status post cardiac ablations  Medical illnesses: History of atrial fib flutter status post conversion following ablation, history of depression, migraines, COPD, asthma  Physical exam: Healthy-appearing patient, no evident distress, mental status normal. Chest clear, with perhaps slightly diminished breath sounds. Heart without murmurs or arrhythmias. Abdomen slightly adipose, no guarding, mass effect, or tenderness. No pallor or icterus. Neurologically grossly intact.  Impression: epigastric pain  Plan: Endoscopic evaluation. The nature, purpose, and risks have been previously discussed with patient she is agreeable.  Florencia Reasons, M.D. [phone]

## 2013-01-06 NOTE — Op Note (Signed)
[facility] [address] [place], [address]   ENDOSCOPY PROCEDURE REPORT  PATIENT: Suzanne [name], Suzanne [name]  MR#: [id number] BIRTHDATE: [birth year] , 62  yrs. old GENDER: Female ENDOSCOPIST:Chiyoko Torrico, MD REFERRED BY:  Dr. Catha Gosselin PROCEDURE DATE:  01/06/2013 PROCEDURE:      Upper endoscopy with biopsies ASA CLASS: INDICATIONS:   persistent epigastric pain of approximately 2 months duration, unresponsive to PPI therapy, Hemoccult negative, no weight loss MEDICATION:    MAC, per anesthesia TOPICAL ANESTHETIC:    Cetacaine spray  DESCRIPTION OF PROCEDURE:   the patient came as an outpatient to the [facility] long endoscopy unit. She remained stable throughout the procedure. She had provided written consent, and pre-sedation evaluation and time out were performed.  After being sedated, the Pentax adult video endoscope was passed under direct vision. The vocal cords were unremarkable. The esophagus was readily entered and was normal in its entirety, without evidence of reflux esophagitis, Barrett's esophagus, varices, infection, or neoplasia. No ring, stricture, or hiatal hernia was seen.  The stomach was entered. It contained no residual and had normal motility. It was readily distensible.  There was a 5 mm sessile gastric polyp near the cardia of the stomach, removed by a single cold biopsy. It had the appearance of a typical fundic gland polyp.  The stomach was otherwise normal. No gastric residual, gastritis, or mucosal abnormalities such as erosions, ulcers, or masses were observed.  The pylorus, duodenal bulb, and second duodenum looked normal.  Random mucosal biopsies were obtained from the duodenum, antrum, and distal esophagus in view of the patient's unexplained upper abdominal discomfort.  The patient tolerated the procedure well.     COMPLICATIONS: None  ENDOSCOPIC IMPRESSION:  1. Essentially normal upper endoscopy 2. Small proximal  gastric polyp, not felt to be clinically significant 3. No source of patient's epigastric discomfort endoscopically evident  RECOMMENDATIONS:  1. Continue current medications 2. Await pathology results 3. Consider frequent small meals    _______________________________ eSigned:  Bernette Redbird, MD 01/06/2013 11:43 AM    PATIENT NAME:  [name], [name] MR#: [id number]

## 2013-01-06 NOTE — Anesthesia Postprocedure Evaluation (Signed)
  Anesthesia Post-op Note  Patient: Suzanne [name]  Procedure(s) Performed: Procedure(s) (LRB): ESOPHAGOGASTRODUODENOSCOPY (EGD) WITH PROPOFOL (N/A)  Patient Location: PACU  Anesthesia Type: MAC  Level of Consciousness: awake and alert   Airway and Oxygen Therapy: Patient Spontanous Breathing  Post-op Pain: mild  Post-op Assessment: Post-op Vital signs reviewed, Patient's Cardiovascular Status Stable, Respiratory Function Stable, Patent Airway and No signs of Nausea or vomiting  Last Vitals:  Filed Vitals:   01/06/13 1132  BP: 140/76  Pulse: 79  Temp: 37 C  Resp: 18    Post-op Vital Signs: stable   Complications: No apparent anesthesia complications

## 2013-01-07 ENCOUNTER — Encounter (HOSPITAL_COMMUNITY): Payer: Self-pay | Admitting: Gastroenterology

## 2013-03-29 ENCOUNTER — Encounter: Payer: Self-pay | Admitting: Interventional Cardiology

## 2013-04-07 ENCOUNTER — Ambulatory Visit: Payer: 59 | Admitting: Neurology

## 2013-04-18 ENCOUNTER — Encounter: Payer: Self-pay | Admitting: Neurology

## 2013-04-18 ENCOUNTER — Ambulatory Visit (INDEPENDENT_AMBULATORY_CARE_PROVIDER_SITE_OTHER): Payer: 59 | Admitting: Neurology

## 2013-04-18 ENCOUNTER — Encounter (INDEPENDENT_AMBULATORY_CARE_PROVIDER_SITE_OTHER): Payer: Self-pay

## 2013-04-18 VITALS — BP 117/54 | HR 69 | Wt 168.0 lb

## 2013-04-18 DIAGNOSIS — G43909 Migraine, unspecified, not intractable, without status migrainosus: Secondary | ICD-10-CM

## 2013-04-18 MED ORDER — RIZATRIPTAN BENZOATE 10 MG PO TBDP: 10.0000 mg | ORAL_TABLET | ORAL | Status: DC | PRN

## 2013-04-18 MED ORDER — SUMATRIPTAN SUCCINATE 6 MG/0.5ML ~~LOC~~ SOLN: 6.0000 mg | SUBCUTANEOUS | Status: DC | PRN

## 2013-04-18 MED ORDER — BUTALBITAL-APAP-CAFFEINE 50-325-40 MG PO TABS: 1.0000 | ORAL_TABLET | Freq: Four times a day (QID) | ORAL | Status: DC | PRN

## 2013-04-18 NOTE — Progress Notes (Signed)
Reason for visit: Migraine headache  Suzanne [name] is an 63 y.o. female  History of present illness:  Suzanne [name] is a 63 year old right-handed white female with a history of migraine headache. The patient has been getting Botox injections with some improvement. The patient indicates that she will be retiring in May of 2015, and her insurance situation will change, and he will make it difficult to afford the ongoing Botox injections. The patient is getting some benefit with the Maxalt, and she will use Imitrex injections as a backup. The patient returns for an evaluation. No other new medical issues have come up since last seen.  Past Medical History  Diagnosis Date  . Atrial fibrillation     s/p ablations  . Atrial flutter   . H/O: rheumatic fever 1957  . COPD (chronic obstructive pulmonary disease)   . Depression   . Migraine   . Frozen shoulder     Left  . History of asthma   . Degenerative arthritis   . PONV (postoperative nausea and vomiting)     Past Surgical History  Procedure Laterality Date  . Knee surgery  [phone]  . Rhinoplasty  age 42  . Pvi  2/11   & 6/11  . Cti ablation  3/11  . Tonsillectomy  as child  . Total abdominal hysterectomy  1990    right ovary removed  . 2 teeth extraction  2009  . Esophagogastroduodenoscopy (egd) with propofol N/A 01/06/2013    Procedure: ESOPHAGOGASTRODUODENOSCOPY (EGD) WITH PROPOFOL;  Surgeon: Cleotis Nipper, MD;  Location: [facility] ENDOSCOPY;  Service: Endoscopy;  Laterality: N/A;    Family History  Problem Relation Age of Onset  . Cancer Mother     renal cell  . Migraines Mother   . Dementia Father   . Heart disease Father     Social history:  reports that she has been smoking.  She has never used smokeless tobacco. She reports that she drinks alcohol. She reports that she does not use illicit drugs.    Allergies  Allergen Reactions  . Dilaudid [Hydromorphone Hcl] Nausea And Vomiting  . Erythromycin      REACTION: rash  . Fentanyl Nausea And Vomiting  . Ciprofloxacin Rash    Medications:  Current Outpatient Prescriptions on File Prior to Visit  Medication Sig Dispense Refill  . ALPRAZolam (XANAX) 0.25 MG tablet Take 0.25 mg by mouth at bedtime as needed for anxiety or sleep.       Marland Kitchen aspirin 81 MG tablet Take 81 mg by mouth daily.      Marland Kitchen buPROPion (WELLBUTRIN XL) 300 MG 24 hr tablet Take 300 mg by mouth daily with breakfast.       . promethazine (PHENERGAN) 25 MG tablet Take 25 mg by mouth every 6 (six) hours as needed for nausea or vomiting.       . sertraline (ZOLOFT) 100 MG tablet Take 100 mg by mouth daily with breakfast.        No current facility-administered medications on file prior to visit.    ROS:  Out of a complete 14 system review of symptoms, the patient complains only of the following symptoms, and all other reviewed systems are negative.  Migraine headache  Blood pressure 117/54, pulse 69, weight 168 lb (76.204 kg).  Physical Exam  General: The patient is alert and cooperative at the time of the examination.  Skin: No significant peripheral edema is noted.   Neurologic Exam  Mental status: The  patient is oriented x 3.  Cranial nerves: Facial symmetry is present. Speech is normal, no aphasia or dysarthria is noted. Extraocular movements are full. Visual fields are full.  Motor: The patient has good strength in all 4 extremities.  Sensory examination: Soft touch sensation is symmetric on the face, arms, and legs.  Coordination: The patient has good finger-nose-finger and heel-to-shin bilaterally.  Gait and station: The patient has a normal gait. Tandem gait is slightly unsteady. Romberg is negative. No drift is seen.  Reflexes: Deep tendon reflexes are symmetric.   Assessment/Plan:  One. Migraine headache  The patient will be continued on her medications. Prescriptions were given for Imitrex injections, Maxalt, and Fioricet. The patient will  followup through this office in 6-8 months.  Jill Alexanders MD 04/18/2013 7:34 PM  [facility] [address] [place], [place] [address]  Phone [phone] Fax [phone]

## 2013-04-18 NOTE — Patient Instructions (Signed)
Migraine Headache A migraine headache is very bad, throbbing pain on one or both sides of your head. Talk to your doctor about what things may bring on (trigger) your migraine headaches. HOME CARE  Only take medicines as told by your doctor.  Lie down in a dark, quiet room when you have a migraine.  Keep a journal to find out if certain things bring on migraine headaches. For example, write down:  What you eat and drink.  How much sleep you get.  Any change to your diet or medicines.  Lessen how much alcohol you drink.  Quit smoking if you smoke.  Get enough sleep.  Lessen any stress in your life.  Keep lights dim if bright lights bother you or make your migraines worse. GET HELP RIGHT AWAY IF:   Your migraine becomes really bad.  You have a fever.  You have a stiff neck.  You have trouble seeing.  Your muscles are weak, or you lose muscle control.  You lose your balance or have trouble walking.  You feel like you will pass out (faint), or you pass out.  You have really bad symptoms that are different than your first symptoms. MAKE SURE YOU:   Understand these instructions.  Will watch your condition.  Will get help right away if you are not doing well or get worse. Document Released: 10/30/2007 Document Revised: 04/14/2011 Document Reviewed: 09/27/2012 [employer] Patient Information 2014 [employer].  

## 2013-04-28 ENCOUNTER — Encounter (HOSPITAL_COMMUNITY): Payer: Self-pay | Admitting: Pharmacy Technician

## 2013-04-29 ENCOUNTER — Encounter (HOSPITAL_COMMUNITY): Payer: Self-pay | Admitting: *Deleted

## 2013-05-05 ENCOUNTER — Ambulatory Visit: Payer: 59 | Admitting: Interventional Cardiology

## 2013-05-16 ENCOUNTER — Encounter: Payer: Self-pay | Admitting: Interventional Cardiology

## 2013-05-16 ENCOUNTER — Ambulatory Visit (INDEPENDENT_AMBULATORY_CARE_PROVIDER_SITE_OTHER): Payer: 59 | Admitting: Interventional Cardiology

## 2013-05-16 VITALS — BP 98/70 | HR 66 | Ht 67.0 in | Wt 167.0 lb

## 2013-05-16 DIAGNOSIS — Z8679 Personal history of other diseases of the circulatory system: Secondary | ICD-10-CM | POA: Insufficient documentation

## 2013-05-16 DIAGNOSIS — Z9889 Other specified postprocedural states: Secondary | ICD-10-CM

## 2013-05-16 DIAGNOSIS — I4891 Unspecified atrial fibrillation: Secondary | ICD-10-CM

## 2013-05-16 DIAGNOSIS — I495 Sick sinus syndrome: Secondary | ICD-10-CM

## 2013-05-16 NOTE — Patient Instructions (Signed)
Your physician recommends that you continue on your current medications as directed. Please refer to the Current Medication list given to you today.  Your physician wants you to follow-up in: 1 year with Dr.Smith You will receive a reminder letter in the mail two months in advance. If you don't receive a letter, please call our office to schedule the follow-up appointment.  

## 2013-05-16 NOTE — Progress Notes (Signed)
Patient ID: Suzanne [name], female   DOB: [birth year], 63 y.o.   MRN: [id number]    [address] [place], [place]  [address] Phone: [phone] Fax:  [phone]  Date:  05/16/2013   ID:  Suzanne [name], DOB 1951/01/20, MRN [id number]  PCP:  Gennette Pac, MD   ASSESSMENT:  1. atrial fibrillation/flutter, controlled after ablation. 2. Sinus node dysfunction, asymptomatic 3. Status post atrial fibrillation ablation  PLAN:  1. no change in current therapy. Monitor for any sustained arrhythmia.   SUBJECTIVE: Suzanne [name] is a 63 y.o. female who had ablation of atrial fibrillation by Dr. Rayann Heman 4 years ago. She has had no symptomatic recurrence since that time and is currently on no rhythm related therapy. She is on an aspirin a day and has a structurally normal heart by echo.   Wt Readings from Last 3 Encounters:  05/16/13 167 lb (75.751 kg)  04/18/13 168 lb (76.204 kg)  01/06/13 164 lb (74.39 kg)     Past Medical History  Diagnosis Date  . Atrial fibrillation     s/p ablations  . Atrial flutter   . H/O: rheumatic fever 1957  . COPD (chronic obstructive pulmonary disease)   . Depression   . Migraine   . Frozen shoulder     Left  . History of asthma   . Degenerative arthritis   . PONV (postoperative nausea and vomiting)     only with Fentanyl and Dilaudid    Current Outpatient Prescriptions  Medication Sig Dispense Refill  . ALPRAZolam (XANAX) 0.25 MG tablet Take 0.25 mg by mouth at bedtime as needed for anxiety or sleep.       Marland Kitchen aspirin EC 81 MG tablet Take 81 mg by mouth daily.      Marland Kitchen aspirin-acetaminophen-caffeine (EXCEDRIN MIGRAINE) 250-250-65 MG per tablet Take 1 tablet by mouth every 6 (six) hours as needed for headache.      Marland Kitchen buPROPion (WELLBUTRIN XL) 300 MG 24 hr tablet Take 300 mg by mouth daily with breakfast.       . butalbital-acetaminophen-caffeine (FIORICET, ESGIC) 50-325-40 MG per tablet Take 1 tablet by mouth every 6  (six) hours as needed for headache or migraine.  90 tablet  3  . calcium carbonate (TUMS - DOSED IN MG ELEMENTAL CALCIUM) 500 MG chewable tablet Chew 1 tablet by mouth as needed for indigestion or heartburn.      . clobetasol ointment (TEMOVATE) 1.63 % Apply 1 application topically 2 (two) times a week.      Marland Kitchen ibuprofen (ADVIL,MOTRIN) 200 MG tablet Take 200-400 mg by mouth every 6 (six) hours as needed (Pain).      . promethazine (PHENERGAN) 25 MG tablet Take 25 mg by mouth every 6 (six) hours as needed for nausea or vomiting.       . ranitidine (ZANTAC) 150 MG tablet Take 150 mg by mouth 2 (two) times daily as needed for heartburn.      . rizatriptan (MAXALT-MLT) 10 MG disintegrating tablet Take 1 tablet (10 mg total) by mouth as needed for migraine. May repeat in 2 hours if needed  30 tablet  3  . sertraline (ZOLOFT) 100 MG tablet Take 100 mg by mouth daily with breakfast.       . SUMAtriptan (IMITREX) 6 MG/0.5ML SOLN injection Inject 0.5 mLs (6 mg total) into the skin every 2 (two) hours as needed for migraine or headache. F  12 vial  3   No  current facility-administered medications for this visit.    Allergies:    Allergies  Allergen Reactions  . Dilaudid [Hydromorphone Hcl] Nausea And Vomiting  . Erythromycin     REACTION: rash  . Fentanyl Nausea And Vomiting  . Ciprofloxacin Rash    Social History:  The patient  reports that she has been smoking.  She has never used smokeless tobacco. She reports that she drinks alcohol. She reports that she does not use illicit drugs.   ROS:  Please see the history of present illness.   Denies neurological symptoms   All other systems reviewed and negative.   OBJECTIVE: VS:  BP 98/70  Pulse 66  Ht 5\' 7"  (1.702 m)  Wt 167 lb (75.751 kg)  BMI 26.15 kg/m2 Well nourished, well developed, in no acute distress, appears her stated age 32: normal Neck: JVD flat. Carotid bruit absent  Cardiac:  normal S1, S2; RRR; no murmur Lungs:  clear to  auscultation bilaterally, no wheezing, rhonchi or rales Abd: soft, nontender, no hepatomegaly Ext: Edema absent. Pulses 2+ Skin: warm and dry Neuro:  CNs 2-12 intact, no focal abnormalities noted  EKG:  Normal sinus rhythm with slight reduction in voltage.       Signed, [place], MD 05/16/2013 3:15 PM

## 2013-05-19 ENCOUNTER — Ambulatory Visit (HOSPITAL_COMMUNITY): Admission: RE | Admit: 2013-05-19 | Payer: 59 | Source: Ambulatory Visit | Admitting: Gastroenterology

## 2013-05-19 SURGERY — COLONOSCOPY WITH PROPOFOL
Anesthesia: Monitor Anesthesia Care

## 2013-06-28 ENCOUNTER — Encounter: Payer: Self-pay | Admitting: Interventional Cardiology

## 2013-09-23 ENCOUNTER — Other Ambulatory Visit: Payer: Self-pay

## 2013-09-23 ENCOUNTER — Other Ambulatory Visit: Payer: Self-pay | Admitting: Neurology

## 2013-09-23 MED ORDER — SUMATRIPTAN SUCCINATE 6 MG/0.5ML ~~LOC~~ SOLN: 6.0000 mg | SUBCUTANEOUS | Status: DC | PRN

## 2013-09-29 ENCOUNTER — Other Ambulatory Visit: Payer: Self-pay | Admitting: Neurology

## 2013-09-29 NOTE — Telephone Encounter (Signed)
We have been prescribing Imitrex Injections.  I spoke with the patient.  Says she would prefer to have a Rx for tabs as well and will only use injections if she is very nauseated, otherwise will use tablets.  Says Maxalt does not really work for her, and she would prefer Imitrex instead.  Okay to fill tabs?  Please advise.  Thank you.

## 2013-09-30 ENCOUNTER — Telehealth: Payer: Self-pay | Admitting: Interventional Cardiology

## 2013-09-30 NOTE — Telephone Encounter (Signed)
Called stating she has been having some runs of Afib.  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 States she feels fine now but thinks  she is going in and out of rhythm.  States Dr. Tamala Julian had suggested a monitor if she had episodes again.  Gave her an appointment to see Estella Husk on Mon 8/31.  She is agreeable with the appointment and time.

## 2013-09-30 NOTE — Telephone Encounter (Signed)
New message          C/o afib and would like an appt with Tamala Julian asap

## 2013-10-03 ENCOUNTER — Ambulatory Visit (INDEPENDENT_AMBULATORY_CARE_PROVIDER_SITE_OTHER): Payer: BC Managed Care – PPO | Admitting: Physician Assistant

## 2013-10-03 ENCOUNTER — Encounter: Payer: Self-pay | Admitting: Physician Assistant

## 2013-10-03 VITALS — BP 114/68 | HR 60 | Ht 67.0 in | Wt 162.8 lb

## 2013-10-03 DIAGNOSIS — Z8679 Personal history of other diseases of the circulatory system: Secondary | ICD-10-CM

## 2013-10-03 DIAGNOSIS — Z9889 Other specified postprocedural states: Secondary | ICD-10-CM

## 2013-10-03 DIAGNOSIS — I4891 Unspecified atrial fibrillation: Secondary | ICD-10-CM

## 2013-10-03 DIAGNOSIS — Z720 Tobacco use: Secondary | ICD-10-CM | POA: Insufficient documentation

## 2013-10-03 DIAGNOSIS — I480 Paroxysmal atrial fibrillation: Secondary | ICD-10-CM

## 2013-10-03 DIAGNOSIS — F17200 Nicotine dependence, unspecified, uncomplicated: Secondary | ICD-10-CM

## 2013-10-03 MED ORDER — DILTIAZEM HCL 30 MG PO TABS: ORAL_TABLET | ORAL | Status: DC

## 2013-10-03 NOTE — Progress Notes (Signed)
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 Tamala Julian and Dr. Rayann Heman who underwent ablation for atrial fibrillation in 2011. She last saw Dr. Tamala Julian in April 2015 and  had no symptomatic recurrence since and has been on no rhythm related therapy. She is on an aspirin a day and has a structurally normal heart by echo.  The patient underwent colonoscopy in April 2015 and went into atrial fibrillation at a rate of 90 beats per minute before and during the procedure. She converted to sinus rhythm. Since then she complains of palpitations usually in the morning or evening. They last about 10 minutes and occur 3-4 times per week. She's not going particularly fast and has no associated chest pain. She says she does get a little ankle swelling and short of breath with it. Her CHA2DS2VASC score is 1 being female. She has no history of hypertension, diabetes mellitus, stroke, heart failure or vascular disease. She does continue to smoke 5-10 cigarettes daily. Showing drinks one cup of coffee a day.  Allergies  Allergen Reactions  . Dilaudid [Hydromorphone Hcl] Nausea And Vomiting  . Erythromycin     REACTION: rash  . Fentanyl Nausea And Vomiting  . Ciprofloxacin Rash     Current Outpatient Prescriptions  Medication Sig Dispense Refill  . ALPRAZolam (XANAX) 0.25 MG tablet Take 0.25 mg by mouth at bedtime as needed for anxiety or sleep.       Marland Kitchen aspirin EC 81 MG tablet Take 81 mg by mouth daily.      Marland Kitchen aspirin-acetaminophen-caffeine (EXCEDRIN MIGRAINE) 250-250-65 MG per tablet Take 1 tablet by mouth every 6 (six) hours as needed for headache.      Marland Kitchen buPROPion (WELLBUTRIN XL) 300 MG 24 hr tablet Take 300 mg by mouth daily with breakfast.       . butalbital-acetaminophen-caffeine (FIORICET, ESGIC) 50-325-40 MG per tablet Take 1 tablet by mouth every 6 (six) hours as needed for headache or migraine.  90 tablet  3  . calcium carbonate (TUMS - DOSED IN MG ELEMENTAL CALCIUM) 500 MG  chewable tablet Chew 1 tablet by mouth as needed for indigestion or heartburn.      . clobetasol ointment (TEMOVATE) 8.24 % Apply 1 application topically 2 (two) times a week.      Marland Kitchen ibuprofen (ADVIL,MOTRIN) 200 MG tablet Take 200-400 mg by mouth every 6 (six) hours as needed (Pain).      . promethazine (PHENERGAN) 25 MG tablet Take 25 mg by mouth every 6 (six) hours as needed for nausea or vomiting.       . ranitidine (ZANTAC) 150 MG tablet Take 150 mg by mouth 2 (two) times daily as needed for heartburn.      . rizatriptan (MAXALT-MLT) 10 MG disintegrating tablet Take 1 tablet (10 mg total) by mouth as needed for migraine. May repeat in 2 hours if needed  30 tablet  3  . sertraline (ZOLOFT) 100 MG tablet Take 100 mg by mouth daily with breakfast.       . SUMAtriptan (IMITREX) 100 MG tablet TAKE 1 TABLET BY MOUTH AT THE ONSET OF A HEADACHE  9 tablet  6  . SUMAtriptan (IMITREX) 6 MG/0.5ML SOLN injection Inject 0.5 mLs (6 mg total) into the skin every 2 (two) hours as needed for migraine or headache. F  12 vial  0   No current facility-administered medications for this visit.    Past Medical History  Diagnosis Date  .  Atrial fibrillation     s/p ablations  . Atrial flutter   . H/O: rheumatic fever 1957  . COPD (chronic obstructive pulmonary disease)   . Depression   . Migraine   . Frozen shoulder     Left  . History of asthma   . Degenerative arthritis   . PONV (postoperative nausea and vomiting)     only with Fentanyl and Dilaudid    Past Surgical History  Procedure Laterality Date  . Knee surgery  [phone]  . Rhinoplasty  age 2  . Pvi  2/11   & 6/11  . Cti ablation  3/11  . Tonsillectomy  as child  . Total abdominal hysterectomy  1990    right ovary removed  . 2 teeth extraction  2009  . Esophagogastroduodenoscopy (egd) with propofol N/A 01/06/2013    Procedure: ESOPHAGOGASTRODUODENOSCOPY (EGD) WITH PROPOFOL;  Surgeon: Cleotis Nipper, MD;  Location: [facility]  ENDOSCOPY;  Service: Endoscopy;  Laterality: N/A;    Family History  Problem Relation Age of Onset  . Cancer Mother     renal cell  . Migraines Mother   . Dementia Father   . Heart disease Father     History   Social History  . Marital Status: Married    Spouse Name: N/A    Number of Children: 0  . Years of Education: 16   Occupational History  . 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 [employer]   Social History Main Topics  . Smoking status: Current Every Day Smoker -- 0.50 packs/day for 40 years  . Smokeless tobacco: Never Used  . Alcohol Use: Yes     Comment: 1-2 glasses of wine per year  . Drug Use: No  . Sexual Activity: Not on file   Other Topics Concern  . Not on file   Social History Narrative  . No narrative on file    ROS: See history of present illness otherwise negative  BP 114/68  Pulse 60  Ht 5\' 7"  (1.702 m)  Wt 162 lb 12.8 oz (73.846 kg)  BMI 25.49 kg/m2  PHYSICAL EXAM: Well-nournished, in no acute distress. Neck: No JVD, HJR, Bruit, or thyroid enlargement  Lungs: No tachypnea, clear without wheezing, rales, or rhonchi  Cardiovascular: RRR, PMI not displaced, heart sounds normal, no murmurs, gallops, bruit, thrill, or heave.  Abdomen: BS normal. Soft without organomegaly, masses, lesions or tenderness.  Extremities: without cyanosis, clubbing or edema. Good distal pulses bilateral  SKin: Warm, no lesions or rashes   Musculoskeletal: No deformities  Neuro: no focal signs   Wt Readings from Last 3 Encounters:  05/16/13 167 lb (75.751 kg)  04/18/13 168 lb (76.204 kg)  01/06/13 164 lb (74.39 kg)     EKG: Normal sinus rhythm with nonspecific ST-T wave changes, no acute change  Patient brought a telemetry strip from her colonoscopy that documents atrial fibrillation at 90 beats per minute   2-D echo 07/2009 Study Conclusions   - Left ventricle: Systolic function was mildly reduced. The     estimated ejection fraction was in the range of 45% to 50%.  - Aortic valve: No evidence of vegetation.  - Mitral valve: No evidence of vegetation.  - Left atrium: The atrium was dilated 4.5 cm laterally in 4 chamber    view. No evidence of thrombus in the atrial cavity or appendage.  - Right atrium: No evidence of thrombus in the atrial cavity or  appendage.  - Atrial septum: No defect or patent foramen ovale was identified.  - Tricuspid valve: No evidence of vegetation.  - Pulmonic valve: No evidence of vegetation.  - Superior vena cava: The study excluded a thrombus.  Transesophageal echocardiography. 2D and color Doppler. Patient  status: Outpatient. Location: Endoscopy

## 2013-10-03 NOTE — Patient Instructions (Addendum)
Your physician has recommended you make the following change in your medication:   1. START CARDIZEM 30 MG PRN PALPITATIONS    Your physician wants you to follow-up in: DR ALLRED IN 6 MONTHS  You will receive a reminder letter in the mail two months in advance. If you don't receive a letter, please call our office to schedule the follow-up appointment.  ENCOURAGED TO STOP  SMOKING

## 2013-10-03 NOTE — Assessment & Plan Note (Signed)
Patient underwent ablation in 2011 has now had a recurrent atrial fibrillation that was documented at colonoscopy. I discussed this patient in detail with Dr. Rayann Heman. Her CHA2ds2Vasc score is 1. He recommends Cardizem 30 mg when necessary for palpitations. He does not feel like she needs to be anticoagulated. He will see her back in 6 weeks for followup. Recommend smoking cessation.

## 2013-10-03 NOTE — Assessment & Plan Note (Signed)
Smoking cessation discussed 

## 2013-10-19 ENCOUNTER — Ambulatory Visit (INDEPENDENT_AMBULATORY_CARE_PROVIDER_SITE_OTHER): Payer: Medicare Other | Admitting: Neurology

## 2013-10-19 ENCOUNTER — Encounter: Payer: Self-pay | Admitting: Neurology

## 2013-10-19 VITALS — BP 121/70 | HR 61 | Wt 163.0 lb

## 2013-10-19 DIAGNOSIS — G43909 Migraine, unspecified, not intractable, without status migrainosus: Secondary | ICD-10-CM

## 2013-10-19 NOTE — Patient Instructions (Signed)

## 2013-10-19 NOTE — Progress Notes (Signed)
Reason for visit: Migraine headache  Suzanne [name] is an 63 y.o. female  History of present illness:  Suzanne [name] is a 63 year old right-handed white female with a history of intractable migraine headaches. The patient is retired, but she continues to have headaches that her usual frequency. The patient has about half of the days a month with migraine. She indicates that since she retired, and the headaches are a bit less severe, and more easy to manage. The patient indicates that Fioricet and Excedrin will now be more effective for her headache. She indicates that Imitrex both injectable or oral tablets are more effective than the Maxalt. The patient in the past gained benefit with Botox, but she could not afford the medication. The patient has a different insurance at this time. The patient oftentimes will wake up with the headache, and she may have some neck stiffness associated with the headache. The patient will take medications, and then try to get back to sleep. The patient in the past could not tolerate Topamax secondary to cognitive side effects. She also indicates that beta blockers resulted in a significant lowering of blood pressure. She returns for an evaluation.  Past Medical History  Diagnosis Date  . Atrial fibrillation     s/p ablations  . Atrial flutter   . H/O: rheumatic fever 1957  . COPD (chronic obstructive pulmonary disease)   . Depression   . Migraine   . Frozen shoulder     Left  . History of asthma   . Degenerative arthritis   . PONV (postoperative nausea and vomiting)     only with Fentanyl and Dilaudid    Past Surgical History  Procedure Laterality Date  . Knee surgery  [phone]  . Rhinoplasty  age 54  . Pvi  2/11   & 6/11  . Cti ablation  3/11  . Tonsillectomy  as child  . Total abdominal hysterectomy  1990    right ovary removed  . 2 teeth extraction  2009  . Esophagogastroduodenoscopy (egd) with propofol N/A 01/06/2013   Procedure: ESOPHAGOGASTRODUODENOSCOPY (EGD) WITH PROPOFOL;  Surgeon: Cleotis Nipper, MD;  Location: [facility] ENDOSCOPY;  Service: Endoscopy;  Laterality: N/A;    Family History  Problem Relation Age of Onset  . Cancer Mother     renal cell  . Migraines Mother   . Dementia Father   . Heart disease Father     Social history:  reports that she has been smoking.  She has never used smokeless tobacco. She reports that she drinks alcohol. She reports that she does not use illicit drugs.    Allergies  Allergen Reactions  . Dilaudid [Hydromorphone Hcl] Nausea And Vomiting  . Erythromycin     REACTION: rash  . Fentanyl Nausea And Vomiting  . Ciprofloxacin Rash    Medications:  Current Outpatient Prescriptions on File Prior to Visit  Medication Sig Dispense Refill  . ALPRAZolam (XANAX) 0.25 MG tablet Take 0.25 mg by mouth at bedtime as needed for anxiety or sleep.       Marland Kitchen aspirin EC 81 MG tablet Take 81 mg by mouth daily.      Marland Kitchen aspirin-acetaminophen-caffeine (EXCEDRIN MIGRAINE) 250-250-65 MG per tablet Take 1 tablet by mouth every 6 (six) hours as needed for headache.      Marland Kitchen buPROPion (WELLBUTRIN XL) 300 MG 24 hr tablet Take 300 mg by mouth daily with breakfast.       . butalbital-acetaminophen-caffeine (FIORICET, ESGIC) 50-325-40 MG per tablet  Take 1 tablet by mouth every 6 (six) hours as needed for headache or migraine.  90 tablet  3  . clobetasol ointment (TEMOVATE) 4.54 % Apply 1 application topically 2 (two) times a week.      . diltiazem (CARDIZEM) 30 MG tablet TAKE PRN AS NEEDED FOR PALPITATIONS  30 tablet  5  . ibuprofen (ADVIL,MOTRIN) 200 MG tablet Take 200-400 mg by mouth every 6 (six) hours as needed (Pain).      . promethazine (PHENERGAN) 25 MG tablet Take 25 mg by mouth every 6 (six) hours as needed for nausea or vomiting.       . sertraline (ZOLOFT) 100 MG tablet Take 100 mg by mouth daily with breakfast.       . SUMAtriptan (IMITREX) 100 MG tablet TAKE 1 TABLET BY MOUTH AT THE  ONSET OF A HEADACHE  9 tablet  6  . SUMAtriptan (IMITREX) 6 MG/0.5ML SOLN injection Inject 0.5 mLs (6 mg total) into the skin every 2 (two) hours as needed for migraine or headache. F  12 vial  0   No current facility-administered medications on file prior to visit.    ROS:  Out of a complete 14 system review of symptoms, the patient complains only of the following symptoms, and all other reviewed systems are negative.  Palpitations of the heart Depression Headache  Blood pressure 121/70, pulse 61, weight 163 lb (73.936 kg).  Physical Exam  General: The patient is alert and cooperative at the time of the examination.  Skin: No significant peripheral edema is noted.   Neurologic Exam  Mental status: The patient is oriented x 3.  Cranial nerves: Facial symmetry is present. Speech is normal, no aphasia or dysarthria is noted. Extraocular movements are full. Visual fields are full.  Motor: The patient has good strength in all 4 extremities.  Sensory examination: Soft touch sensation is symmetric on the face, arms, and legs.  Coordination: The patient has good finger-nose-finger and heel-to-shin bilaterally.  Gait and station: The patient has a normal gait. Tandem gait is normal. Romberg is negative. No drift is seen.  Reflexes: Deep tendon reflexes are symmetric.   Assessment/Plan:  One. Intractable migraine headache  The patient continues to have frequent headaches, and she is a Botox candidate, she will try to check into whether her current insurance may cover Botox. If so, we will try to get this set up for her. The patient indicates that Imitrex is more effective than Maxalt, we will continue this medication. If Botox is not covered, we may consider the use of Zonegran. The patient will followup in about 6 months, or sooner if needed.  Jill Alexanders MD 10/19/2013 9:10 PM  [facility] [address] [place], [place] [address]  Phone  [phone] Fax [phone]

## 2013-11-09 ENCOUNTER — Telehealth: Payer: Self-pay | Admitting: Neurology

## 2013-11-09 NOTE — Telephone Encounter (Signed)
Called pt and left message informing her to give our office a call back to schedule an appt for her Botox injections with Dr. Krista Blue and if she has any other problems, questions or concerns to call the office.

## 2013-11-28 ENCOUNTER — Telehealth: Payer: Self-pay | Admitting: Neurology

## 2013-11-28 NOTE — Telephone Encounter (Signed)
LMVM for pt to call back. Specialty pharmacy denied Botox due to not meeting the criteria.

## 2013-11-28 NOTE — Telephone Encounter (Signed)
I will need to write a letter to document the indications for Botox.

## 2013-11-29 ENCOUNTER — Encounter: Payer: Self-pay | Admitting: Neurology

## 2013-12-02 ENCOUNTER — Ambulatory Visit (INDEPENDENT_AMBULATORY_CARE_PROVIDER_SITE_OTHER): Payer: Medicare Other | Admitting: Internal Medicine

## 2013-12-02 ENCOUNTER — Encounter: Payer: Self-pay | Admitting: Internal Medicine

## 2013-12-02 VITALS — BP 104/68 | HR 62 | Ht 67.0 in | Wt 162.0 lb

## 2013-12-02 DIAGNOSIS — I480 Paroxysmal atrial fibrillation: Secondary | ICD-10-CM

## 2013-12-02 DIAGNOSIS — Z9889 Other specified postprocedural states: Secondary | ICD-10-CM

## 2013-12-02 DIAGNOSIS — Z8679 Personal history of other diseases of the circulatory system: Secondary | ICD-10-CM

## 2013-12-02 LAB — TSH: TSH: 2.755 u[IU]/mL (ref 0.350–4.500)

## 2013-12-02 LAB — T4, FREE: FREE T4: 1.02 ng/dL (ref 0.80–1.80)

## 2013-12-02 NOTE — Progress Notes (Signed)
Primary Care Physician: Gennette Pac, MD Referring 75 referred Primary Cardiologist: Dr. Daneen Schick   Suzanne [name] is a 63 y.o. female with a h/o afib/flutter s/p ablation 2011/2012 here for f/u. She has done very well post ablation.  She reports an episode of afib during prep for colonoscopy in March as well as during the procedure.that spontaneously converted. Since then, she has had occasional palpitations.  These typically last about 5 minutes and occur once or twice per week. She is not certain that these episodes are due to afib.  Her exercise tolerance is preserved. Today, she denies symptoms of chest pain, shortness of breath, orthopnea, PND, lower extremity edema, dizziness, presyncope, syncope, or neurologic sequela. The patient is tolerating medications without difficulties and is otherwise without complaint today.   Past Medical History  Diagnosis Date  . Atrial fibrillation     s/p ablations  . Atrial flutter   . H/O: rheumatic fever 1957  . COPD (chronic obstructive pulmonary disease)   . Depression   . Migraine   . Frozen shoulder     Left  . History of asthma   . Degenerative arthritis   . PONV (postoperative nausea and vomiting)     only with Fentanyl and Dilaudid   Past Surgical History  Procedure Laterality Date  . Knee surgery  [phone]  . Rhinoplasty  age 6  . Pvi  2/11   & 6/11  . Cti ablation  3/11  . Tonsillectomy  as child  . Total abdominal hysterectomy  1990    right ovary removed  . 2 teeth extraction  2009  . Esophagogastroduodenoscopy (egd) with propofol N/A 01/06/2013    Procedure: ESOPHAGOGASTRODUODENOSCOPY (EGD) WITH PROPOFOL;  Surgeon: Cleotis Nipper, MD;  Location: [facility];  Service: Endoscopy;  Laterality: N/A;    Current Outpatient Prescriptions  Medication Sig Dispense Refill  . ALPRAZolam (XANAX) 0.25 MG tablet Take 0.25 mg by mouth at bedtime as needed for anxiety or sleep.       Marland Kitchen  aspirin EC 81 MG tablet Take 81 mg by mouth daily.      Marland Kitchen aspirin-acetaminophen-caffeine (EXCEDRIN MIGRAINE) 250-250-65 MG per tablet Take 1 tablet by mouth every 6 (six) hours as needed for headache.      Marland Kitchen buPROPion (WELLBUTRIN XL) 300 MG 24 hr tablet Take 300 mg by mouth daily with breakfast.       . butalbital-acetaminophen-caffeine (FIORICET, ESGIC) 50-325-40 MG per tablet Take 1 tablet by mouth every 6 (six) hours as needed for headache or migraine.  90 tablet  3  . clobetasol ointment (TEMOVATE) 7.35 % Apply 1 application topically 2 (two) times a week.      . diltiazem (CARDIZEM) 30 MG tablet Take 30 mg by mouth daily as needed (palpitations).      Marland Kitchen ibuprofen (ADVIL,MOTRIN) 200 MG tablet Take 200-400 mg by mouth every 6 (six) hours as needed (Pain).      . promethazine (PHENERGAN) 25 MG tablet Take 25 mg by mouth every 6 (six) hours as needed for nausea or vomiting.       . sertraline (ZOLOFT) 100 MG tablet Take 100 mg by mouth daily with breakfast.       . SUMAtriptan (IMITREX) 100 MG tablet TAKE 1 TABLET BY MOUTH AT THE ONSET OF A HEADACHE  9 tablet  6  . SUMAtriptan (IMITREX) 6 MG/0.5ML SOLN injection Inject 0.5 mLs (6 mg total) into the skin every 2 (two) hours as needed  for migraine or headache. F  12 vial  0   No current facility-administered medications for this visit.    Allergies  Allergen Reactions  . Dilaudid [Hydromorphone Hcl] Nausea And Vomiting  . Fentanyl Nausea And Vomiting  . Ciprofloxacin Rash  . Erythromycin Rash    History   Social History  . Marital Status: Married    Spouse Name: N/A    Number of Children: 0  . Years of Education: 16   Occupational History  . 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 [place]   Social History Main Topics  . Smoking status: Current Every Day Smoker -- 0.50 packs/day for 40 years  . Smokeless tobacco: Never Used  . Alcohol Use: Yes     Comment: 1-2 glasses of wine per year  . Drug  Use: No  . Sexual Activity: Not on file   Other Topics Concern  . Not on file   Social History Narrative  . No narrative on file    Family History  Problem Relation Age of Onset  . Cancer Mother     renal cell  . Migraines Mother   . Dementia Father   . Heart disease Father     ROS- All systems are reviewed and negative except as per the HPI above  Physical Exam: Filed Vitals:   12/02/13 1512  BP: 104/68  Pulse: 62  Height: 5\' 7"  (1.702 m)  Weight: 162 lb (73.483 kg)    GEN- The patient is well appearing, alert and oriented x 3 today.   Head- normocephalic, atraumatic Eyes-  Sclera clear, conjunctiva pink Ears- hearing intact Oropharynx- clear Neck- supple, no JVP Lymph- no cervical lymphadenopathy Lungs- Clear to ausculation bilaterally, normal work of breathing Heart- Regular rate and rhythm, no murmurs, rubs or gallops, PMI not laterally displaced GI- soft, NT, ND, + BS Extremities- no clubbing, cyanosis, or edema MS- no significant deformity or atrophy Skin- no rash or lesion Psych- euthymic mood, full affect Neuro- strength and sensation are intact  EKG- NSR st/twave abnormality inferiorlateral( new from EKG in August)  Assessment and Plan: 1.  Afib s/p ablation - She was documented to have af at time of colonoscopy in March which she attributes to her colon prep.  She has occasional palpitations which are short lived which she thinks could be due to afib but she is not sure. Chadsvasc is now 2( age/Female).  SHe is clear at this time that she would like to avoid anticoagulation  I have therefore advised implantation of a LinQ monitor to better characterize her palpitations and for AF management post ablation.  She will think about this option and will contact my office if she decides to proceed.  Obtain an echo at this time and also TFTs to look for other causes of her palpitations.  2. New EKG st changes- asymptomatic Echo pending, further w/u if needed  pending results.  F/u in 3 months.

## 2013-12-02 NOTE — Patient Instructions (Signed)
Your physician recommends that you schedule a follow-up appointment in: 3 months with Dr Rayann Heman  Your physician has requested that you have an echocardiogram. Echocardiography is a painless test that uses sound waves to create images of your heart. It provides your doctor with information about the size and shape of your heart and how well your heart's chambers and valves are working. This procedure takes approximately one hour. There are no restrictions for this procedure.  Your physician recommends that you return for lab work today: TSH/Free T4

## 2013-12-05 ENCOUNTER — Ambulatory Visit (HOSPITAL_COMMUNITY): Payer: Medicare Other | Attending: Internal Medicine | Admitting: Cardiology

## 2013-12-05 ENCOUNTER — Ambulatory Visit (HOSPITAL_COMMUNITY): Payer: Medicare Other

## 2013-12-05 DIAGNOSIS — Z9889 Other specified postprocedural states: Secondary | ICD-10-CM

## 2013-12-05 DIAGNOSIS — I4892 Unspecified atrial flutter: Secondary | ICD-10-CM | POA: Insufficient documentation

## 2013-12-05 DIAGNOSIS — I4891 Unspecified atrial fibrillation: Secondary | ICD-10-CM | POA: Insufficient documentation

## 2013-12-05 DIAGNOSIS — Z8679 Personal history of other diseases of the circulatory system: Secondary | ICD-10-CM

## 2013-12-05 DIAGNOSIS — I480 Paroxysmal atrial fibrillation: Secondary | ICD-10-CM

## 2013-12-05 NOTE — Progress Notes (Signed)
Echo performed. 

## 2013-12-06 ENCOUNTER — Encounter (HOSPITAL_COMMUNITY): Payer: Self-pay | Admitting: Cardiovascular Disease

## 2013-12-21 ENCOUNTER — Encounter: Payer: Self-pay | Admitting: Neurology

## 2013-12-27 ENCOUNTER — Encounter: Payer: Self-pay | Admitting: Neurology

## 2014-01-03 ENCOUNTER — Telehealth: Payer: Self-pay | Admitting: Neurology

## 2014-01-03 NOTE — Telephone Encounter (Signed)
M[place],Pharmacist with [facility] @ [phone], stated Rx butalbital-acetaminophen-caffeine (FIORICET, ESGIC) 50-325-40 MG per tablet is a medication listed on MCR as high risk for patient.  Future refills may require prior authorization and safer alternative medication.  FYI.

## 2014-01-16 ENCOUNTER — Other Ambulatory Visit (HOSPITAL_COMMUNITY): Payer: Self-pay | Admitting: Family Medicine

## 2014-01-16 DIAGNOSIS — Z1231 Encounter for screening mammogram for malignant neoplasm of breast: Secondary | ICD-10-CM

## 2014-02-01 ENCOUNTER — Ambulatory Visit (HOSPITAL_COMMUNITY)
Admission: RE | Admit: 2014-02-01 | Discharge: 2014-02-01 | Disposition: A | Discharge status: Home / Self Care | Payer: Medicare Other | Source: Ambulatory Visit | Attending: Family Medicine | Admitting: Family Medicine

## 2014-02-01 DIAGNOSIS — Z1231 Encounter for screening mammogram for malignant neoplasm of breast: Secondary | ICD-10-CM | POA: Diagnosis present

## 2014-02-07 ENCOUNTER — Encounter: Payer: Self-pay | Admitting: Neurology

## 2014-02-07 ENCOUNTER — Ambulatory Visit (INDEPENDENT_AMBULATORY_CARE_PROVIDER_SITE_OTHER): Payer: Medicare Other | Admitting: Neurology

## 2014-02-07 VITALS — BP 114/53 | HR 64

## 2014-02-07 DIAGNOSIS — G43719 Chronic migraine without aura, intractable, without status migrainosus: Secondary | ICD-10-CM

## 2014-02-07 MED ORDER — PROMETHAZINE HCL 25 MG PO TABS: 25.0000 mg | ORAL_TABLET | Freq: Four times a day (QID) | ORAL | Status: DC | PRN

## 2014-02-07 NOTE — Progress Notes (Addendum)
Please refer to Botox procedure note.  [place] number is [id number] 2. The expiration date is March 2017. The lot number is  [id number]

## 2014-02-07 NOTE — Procedures (Signed)
     BOTOX PROCEDURE NOTE FOR MIGRAINE HEADACHE   HISTORY: Suzanne [name] is a 64 year old patient with a history of intractable migraine headache. She indicates that the headaches have actually worsened in the last month, she has had only 3 headache free days within the last month. She indicates that weather changes are significant activator for her headache. She has had episodes of severe nausea recently as well. She comes into the office today for Botox injections for her migraine. In the past, Botox is been quite effective for her headache.   Description of procedure:  The patient was placed in a sitting position. The standard protocol was used for Botox as follows, with 5 units of Botox injected at each site:   -Procerus muscle, midline injection  -Corrugator muscle, bilateral injection  -Frontalis muscle, bilateral injection, with 2 sites each side, medial injection was performed in the upper one third of the frontalis muscle, in the region vertical from the medial inferior edge of the superior orbital rim. The lateral injection was again in the upper one third of the forehead vertically above the lateral limbus of the cornea, 1.5 cm lateral to the medial injection site.  -Temporalis muscle injection, 4 sites, bilaterally. The first injection was 3 cm above the tragus of the ear, second injection site was 1.5 cm to 3 cm up from the first injection site in line with the tragus of the ear. The third injection site was 1.5-3 cm forward between the first 2 injection sites. The fourth injection site was 1.5 cm posterior to the second injection site.  -Occipitalis muscle injection, 3 sites, bilaterally. The first injection was done one half way between the occipital protuberance and the tip of the mastoid process behind the ear. The second injection site was done lateral and superior to the first, 1 fingerbreadth from the first injection. The third injection site was 1 fingerbreadth superiorly  and medially from the first injection site.  -Cervical paraspinal muscle injection, 2 sites, bilateral knee first injection site was 1 cm from the midline of the cervical spine, 3 cm inferior to the lower border of the occipital protuberance. The second injection site was 1.5 cm superiorly and laterally to the first injection site.  -Trapezius muscle injection was performed at 3 sites, bilaterally. The first injection site was in the upper trapezius muscle halfway between the inflection point of the neck, and the acromion. The second injection site was one half way between the acromion and the first injection site. The third injection was done between the first injection site and the inflection point of the neck.   A 200 unit bottle of Botox was used, 155 units were injected, the rest of the Botox was wasted. The patient tolerated the procedure well, there were no complications of the above procedure.

## 2014-03-06 ENCOUNTER — Ambulatory Visit (INDEPENDENT_AMBULATORY_CARE_PROVIDER_SITE_OTHER): Payer: Medicare Other | Admitting: Internal Medicine

## 2014-03-06 ENCOUNTER — Encounter: Payer: Self-pay | Admitting: Internal Medicine

## 2014-03-06 VITALS — BP 112/68 | HR 71 | Ht 67.0 in | Wt 161.8 lb

## 2014-03-06 DIAGNOSIS — I480 Paroxysmal atrial fibrillation: Secondary | ICD-10-CM

## 2014-03-06 NOTE — Patient Instructions (Signed)
Your physician wants you to follow-up in: 6 months with Dr. Allred. You will receive a reminder letter in the mail two months in advance. If you don't receive a letter, please call our office to schedule the follow-up appointment.  

## 2014-03-06 NOTE — Progress Notes (Signed)
Primary Care Physician: Gennette Pac, MD Referring 69 referred Primary Cardiologist: Dr. Daneen Schick   Suzanne [name] is a 64 y.o. female with a h/o afib/flutter s/p ablation 2011/2012 here for f/u. She has done very well post ablation.  She reports an  occasional episode of afib . Two since last visit that lasted 3-4 hours. She had some episodes on last visit and ILR was  dicussed with patient to try to better understand her afib burden. She was given a CD to review and with discussion with Dr. Rayann Heman today, she still wants to wait 6 more months to pay more attention to the amount of episodes before she consents to this.  Chadsvasc score is 2(HTN, FEMALE) but she defers anticoagulation. Today, she denies symptoms of chest pain, shortness of breath, orthopnea, PND, lower extremity edema, dizziness, presyncope, syncope, or neurologic sequela. The patient is tolerating medications without difficulties and is otherwise without complaint today.   Past Medical History  Diagnosis Date  . Atrial fibrillation     s/p ablations  . Atrial flutter   . H/O: rheumatic fever 1957  . COPD (chronic obstructive pulmonary disease)   . Depression   . Migraine   . Frozen shoulder     Left  . History of asthma   . Degenerative arthritis   . PONV (postoperative nausea and vomiting)     only with Fentanyl and Dilaudid   Past Surgical History  Procedure Laterality Date  . Knee surgery  [phone]  . Rhinoplasty  age 1  . Pvi  2/11   & 6/11  . Cti ablation  3/11  . Tonsillectomy  as child  . Total abdominal hysterectomy  1990    right ovary removed  . 2 teeth extraction  2009  . Esophagogastroduodenoscopy (egd) with propofol N/A 01/06/2013    Procedure: ESOPHAGOGASTRODUODENOSCOPY (EGD) WITH PROPOFOL;  Surgeon: Cleotis Nipper, MD;  Location: [facility];  Service: Endoscopy;  Laterality: N/A;    Current Outpatient Prescriptions  Medication Sig Dispense Refill    . ALPRAZolam (XANAX) 0.5 MG tablet Take 1 tablet by mouth at bedtime as needed. Anxiety or sleep    . aspirin EC 81 MG tablet Take 81 mg by mouth daily.    Marland Kitchen aspirin-acetaminophen-caffeine (EXCEDRIN MIGRAINE) 250-250-65 MG per tablet Take 1 tablet by mouth every 6 (six) hours as needed for headache.    Marland Kitchen BOTOX 200 UNITS SOLR Take as directed by MD    . buPROPion (WELLBUTRIN XL) 300 MG 24 hr tablet Take 300 mg by mouth daily with breakfast.     . butalbital-acetaminophen-caffeine (FIORICET, ESGIC) 50-325-40 MG per tablet Take 1 tablet by mouth every 6 (six) hours as needed for headache or migraine. 90 tablet 3  . clobetasol ointment (TEMOVATE) 5.09 % Apply 1 application topically 2 (two) times a week.    . diltiazem (CARDIZEM) 30 MG tablet Take 30 mg by mouth daily as needed (palpitations).    Marland Kitchen ibuprofen (ADVIL,MOTRIN) 200 MG tablet Take 200-400 mg by mouth every 6 (six) hours as needed (Pain).    . promethazine (PHENERGAN) 25 MG tablet Take 1 tablet (25 mg total) by mouth every 6 (six) hours as needed for nausea or vomiting. 30 tablet 1  . sertraline (ZOLOFT) 100 MG tablet Take 100 mg by mouth daily with breakfast.     . SUMAtriptan (IMITREX) 100 MG tablet TAKE 1 TABLET BY MOUTH AT THE ONSET OF A HEADACHE (Patient taking differently: TAKE 1 TABLET  BY MOUTH AT THE ONSET OF A HEADACHE DAILY AS NEEDED) 9 tablet 6  . SUMAtriptan (IMITREX) 6 MG/0.5ML SOLN injection Inject 0.5 mLs (6 mg total) into the skin every 2 (two) hours as needed for migraine or headache. F 12 vial 0   No current facility-administered medications for this visit.    Allergies  Allergen Reactions  . Dilaudid [Hydromorphone Hcl] Nausea And Vomiting  . Fentanyl Nausea And Vomiting  . Ciprofloxacin Rash  . Erythromycin Rash    History   Social History  . Marital Status: Married    Spouse Name: N/A    Number of Children: 0  . Years of Education: 16   Occupational History  . 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 [place]   Social History Main Topics  . Smoking status: Current Every Day Smoker -- 0.50 packs/day for 40 years  . Smokeless tobacco: Never Used  . Alcohol Use: Yes     Comment: 1-2 glasses of wine per year  . Drug Use: No  . Sexual Activity: Not on file   Other Topics Concern  . Not on file   Social History Narrative    Family History  Problem Relation Age of Onset  . Cancer Mother     renal cell  . Migraines Mother   . Dementia Father   . Heart disease Father     ROS- All systems are reviewed and negative except as per the HPI above  Physical Exam: Filed Vitals:   03/06/14 1351  BP: 112/68  Pulse: 71  Height: 5\' 7"  (1.702 m)  Weight: 161 lb 12.8 oz (73.392 kg)    GEN- The patient is well appearing, alert and oriented x 3 today.   Head- normocephalic, atraumatic Eyes-  Sclera clear, conjunctiva pink Ears- hearing intact Oropharynx- clear Neck- supple, no JVP Lymph- no cervical lymphadenopathy Lungs- Clear to ausculation bilaterally, normal work of breathing Heart- Regular rate and rhythm, no murmurs, rubs or gallops, PMI not laterally displaced GI- soft, NT, ND, + BS Extremities- no clubbing, cyanosis, or edema MS- no significant deformity or atrophy Skin- no rash or lesion Psych- euthymic mood, full affect Neuro- strength and sensation are intact  EKG- NSR, septal infarct age undetermined. 71 bpm.( unchanged from previous)  Echo -Left ventricle: The cavity size was normal. Wall thickness was normal. Systolic function was normal. The estimated ejection fraction was in the range of 55% to 60%. Wall motion was normal; there were no regional wall motion abnormalities. Doppler parameters are consistent with abnormal left ventricular relaxation (grade 1 diastolic dysfunction).  Thyroid panel 10/30 Normal  Assessment and Plan: 1.  Afib s/p ablation - much improved, but still with some episodes. Pt would like to  wait on an ILR and monitor frequency of episodes, make further decision re this in 6 months. Reminded to take Diltiazem 30 mg as needed. Recommended [email or web address] app to help monitor episodes. Still defers NOAC for Chadsvasc of 2.   F/u in 6 months,sooner if needed.     I have seen, examined the patient, and reviewed the above assessment and plan with Roderic Palau NP.  Changes to above are made where necessary.  The patient is contemplating ILR placement which I believe would be very helpful to manager her AF post ablation.  She is doing reasonably well and does not wish to make changes at this time.  I did discuss [employer] as an alternative to ILR  placement and have provided her with the website for further consideration. She will return in 6 months for follow-up.   Co Sign: Thompson Grayer, MD 03/06/2014 8:27 PM

## 2014-04-19 ENCOUNTER — Encounter: Payer: Self-pay | Admitting: Neurology

## 2014-04-19 ENCOUNTER — Ambulatory Visit (INDEPENDENT_AMBULATORY_CARE_PROVIDER_SITE_OTHER): Payer: Medicare Other | Admitting: Neurology

## 2014-04-19 VITALS — BP 121/65 | HR 63 | Ht 67.0 in | Wt 164.0 lb

## 2014-04-19 DIAGNOSIS — G43019 Migraine without aura, intractable, without status migrainosus: Secondary | ICD-10-CM

## 2014-04-19 NOTE — Patient Instructions (Signed)

## 2014-04-19 NOTE — Progress Notes (Signed)
Reason for visit: Migraine headache  Suzanne [name] is an 64 y.o. female  History of present illness:  Suzanne [name] is a 64 year old right-handed white female with a history of intractable migraine headaches. The patient last had her Botox therapy on 02/07/2014. The patient indicates that she has gotten good improvement with the Botox. She had been averaging about 21 headache days a month. She now has dropped to averaging about 10 or 12 headaches a month. She indicates that she has only had 5 severe headaches since last seen which is highly unusual for her. She indicates that a single Imitrex dosing will help the headache, she does not have to repeat the dosing. She is averaging about 2 minor headaches a week at this point. She believes that the Botox has offered significant benefit for her. She does have allergy associated headaches that are worse in the months of May and October, and weather-related headaches that are worse in July. The patient returns for an evaluation. She tolerated the Botox quite well. The headaches are lasting only about 2 hours following the Imitrex dosing.  Past Medical History  Diagnosis Date  . Atrial fibrillation     s/p ablations  . Atrial flutter   . H/O: rheumatic fever 1957  . COPD (chronic obstructive pulmonary disease)   . Depression   . Migraine   . Frozen shoulder     Left  . History of asthma   . Degenerative arthritis   . PONV (postoperative nausea and vomiting)     only with Fentanyl and Dilaudid    Past Surgical History  Procedure Laterality Date  . Knee surgery  [phone]  . Rhinoplasty  age 68  . Pvi  2/11   & 6/11  . Cti ablation  3/11  . Tonsillectomy  as child  . Total abdominal hysterectomy  1990    right ovary removed  . 2 teeth extraction  2009  . Esophagogastroduodenoscopy (egd) with propofol N/A 01/06/2013    Procedure: ESOPHAGOGASTRODUODENOSCOPY (EGD) WITH PROPOFOL;  Surgeon: Cleotis Nipper, MD;  Location: [facility];  Service: Endoscopy;  Laterality: N/A;    Family History  Problem Relation Age of Onset  . Cancer Mother     renal cell  . Migraines Mother   . Dementia Father   . Heart disease Father     Social history:  reports that she has been smoking.  She has never used smokeless tobacco. She reports that she drinks alcohol. She reports that she does not use illicit drugs.    Allergies  Allergen Reactions  . Dilaudid [Hydromorphone Hcl] Nausea And Vomiting  . Fentanyl Nausea And Vomiting  . Ciprofloxacin Rash  . Erythromycin Rash    Medications:  Prior to Admission medications   Medication Sig Start Date End Date Taking? Authorizing Provider  ALPRAZolam Duanne Moron) 0.5 MG tablet Take 1 tablet by mouth at bedtime as needed. Anxiety or sleep 12/21/13  Yes Historical Provider, MD  aspirin EC 81 MG tablet Take 81 mg by mouth daily.   Yes Historical Provider, MD  aspirin-acetaminophen-caffeine (EXCEDRIN MIGRAINE) [phone] MG per tablet Take 1 tablet by mouth every 6 (six) hours as needed for headache.   Yes Historical Provider, MD  BOTOX 200 UNITS SOLR Take as directed by MD 12/07/13  Yes Historical Provider, MD  buPROPion (WELLBUTRIN XL) 300 MG 24 hr tablet Take 300 mg by mouth daily with breakfast.    Yes Historical Provider, MD  butalbital-acetaminophen-caffeine (FIORICET, [place]) [phone]  MG per tablet Take 1 tablet by mouth every 6 (six) hours as needed for headache or migraine. 04/18/13  Yes Kathrynn Ducking, MD  clobetasol ointment (TEMOVATE) 1.02 % Apply 1 application topically 2 (two) times a week.   Yes Historical Provider, MD  diltiazem (CARDIZEM) 30 MG tablet Take 30 mg by mouth daily as needed (palpitations). 10/03/13  Yes Imogene Burn, PA-C  ibuprofen (ADVIL,MOTRIN) 200 MG tablet Take 200-400 mg by mouth every 6 (six) hours as needed (Pain).   Yes Historical Provider, MD  sertraline (ZOLOFT) 100 MG tablet Take 100 mg by mouth daily with breakfast.    Yes Historical  Provider, MD  SUMAtriptan (IMITREX) 100 MG tablet TAKE 1 TABLET BY MOUTH AT THE ONSET OF A HEADACHE Patient taking differently: TAKE 1 TABLET BY MOUTH AT THE ONSET OF A HEADACHE DAILY AS NEEDED 09/29/13  Yes Kathrynn Ducking, MD  SUMAtriptan (IMITREX) 6 MG/0.5ML SOLN injection Inject 0.5 mLs (6 mg total) into the skin every 2 (two) hours as needed for migraine or headache. F 09/23/13  Yes Kathrynn Ducking, MD    ROS:  Out of a complete 14 system review of symptoms, the patient complains only of the following symptoms, and all other reviewed systems are negative.  Headache  Blood pressure 121/65, pulse 63, height 5\' 7"  (1.702 m), weight 164 lb (74.39 kg).  Physical Exam  General: The patient is alert and cooperative at the time of the examination.  Skin: No significant peripheral edema is noted.   Neurologic Exam  Mental status: The patient is alert and oriented x 3 at the time of the examination. The patient has apparent normal recent and remote memory, with an apparently normal attention span and concentration ability.   Cranial nerves: Facial symmetry is present. Speech is normal, no aphasia or dysarthria is noted. Extraocular movements are full. Visual fields are full.  Motor: The patient has good strength in all 4 extremities.  Sensory examination: Soft touch sensation is symmetric on the face, arms, and legs.  Coordination: The patient has good finger-nose-finger and heel-to-shin bilaterally.  Gait and station: The patient has a normal gait. Tandem gait is normal. Romberg is negative. No drift is seen.  Reflexes: Deep tendon reflexes are symmetric.   Assessment/Plan:  1. Intractable migraine  The patient has done quite well with the initial Botox therapy. Her headache frequency has been markedly reduced, and her quality of life improved. We will plan on repeating the Botox therapy in about 3 weeks. The patient has specially pharmacy where she obtains her medications. She  will follow-up for the injections.  Jill Alexanders MD 04/19/2014 8:41 PM  [facility] [address] [place], [place] [address]  Phone [phone] Fax [phone]

## 2014-05-17 ENCOUNTER — Ambulatory Visit (INDEPENDENT_AMBULATORY_CARE_PROVIDER_SITE_OTHER): Payer: Medicare Other | Admitting: Neurology

## 2014-05-17 ENCOUNTER — Encounter: Payer: Self-pay | Admitting: Neurology

## 2014-05-17 VITALS — BP 122/66 | HR 76

## 2014-05-17 DIAGNOSIS — G43719 Chronic migraine without aura, intractable, without status migrainosus: Secondary | ICD-10-CM | POA: Diagnosis not present

## 2014-05-17 MED ORDER — SUMATRIPTAN SUCCINATE 100 MG PO TABS: ORAL_TABLET | ORAL | Status: DC

## 2014-05-17 MED ORDER — PROMETHAZINE HCL 25 MG PO TABS: 25.0000 mg | ORAL_TABLET | Freq: Four times a day (QID) | ORAL | Status: DC | PRN

## 2014-05-17 MED ORDER — BUTALBITAL-APAP-CAFFEINE 50-325-40 MG PO TABS: 1.0000 | ORAL_TABLET | Freq: Four times a day (QID) | ORAL | Status: DC | PRN

## 2014-05-17 NOTE — Procedures (Signed)
     BOTOX PROCEDURE NOTE FOR MIGRAINE HEADACHE   HISTORY: Suzanne [name] is a 64 year old patient with a history of intractable migraine headache. She returns today for a Botox injection. She has gained improvement with the Botox previously, but the headaches are returning within the last month. She is returning to her usual frequency of about 21 headaches a month. She had gotten down to about 10 headaches a month with the use of Botox. The headaches may last all day long.   Description of procedure:  The patient was placed in a sitting position. The standard protocol was used for Botox as follows, with 5 units of Botox injected at each site:   -Procerus muscle, midline injection  -Corrugator muscle, bilateral injection  -Frontalis muscle, bilateral injection, with 2 sites each side, medial injection was performed in the upper one third of the frontalis muscle, in the region vertical from the medial inferior edge of the superior orbital rim. The lateral injection was again in the upper one third of the forehead vertically above the lateral limbus of the cornea, 1.5 cm lateral to the medial injection site.  -Temporalis muscle injection, 4 sites, bilaterally. The first injection was 3 cm above the tragus of the ear, second injection site was 1.5 cm to 3 cm up from the first injection site in line with the tragus of the ear. The third injection site was 1.5-3 cm forward between the first 2 injection sites. The fourth injection site was 1.5 cm posterior to the second injection site.  -Occipitalis muscle injection, 3 sites, bilaterally. The first injection was done one half way between the occipital protuberance and the tip of the mastoid process behind the ear. The second injection site was done lateral and superior to the first, 1 fingerbreadth from the first injection. The third injection site was 1 fingerbreadth superiorly and medially from the first injection site.  -Cervical paraspinal  muscle injection, 2 sites, bilateral knee first injection site was 1 cm from the midline of the cervical spine, 3 cm inferior to the lower border of the occipital protuberance. The second injection site was 1.5 cm superiorly and laterally to the first injection site.  -Trapezius muscle injection was performed at 3 sites, bilaterally. The first injection site was in the upper trapezius muscle halfway between the inflection point of the neck, and the acromion. The second injection site was one half way between the acromion and the first injection site. The third injection was done between the first injection site and the inflection point of the neck.   A 200 unit bottle of Botox was used, 155 units were injected, the rest of the Botox was wasted. The patient tolerated the procedure well, there were no complications of the above procedure.  Botox NDC 4540-9811-91 Lot number [id number] Expiration date 10/18

## 2014-05-17 NOTE — Progress Notes (Signed)
Suzanne [name] is a 64 year old patient with a history of intractable migraine headache. The patient has gained improvement with Botox. She has had some transient ptosis of the right eye following Botox therapy last time. The patient had dropped to having only 10 or 12 headaches a month, now she is back to her usual baseline of around 20 or 21 headaches a month. She indicates that April and May are the worst months of the year for her headaches, possibly associated with allergies. She returns for Botox injection. The headaches may last all day long. The patient takes Imitrex for the headache with some benefit.

## 2014-05-19 ENCOUNTER — Ambulatory Visit (INDEPENDENT_AMBULATORY_CARE_PROVIDER_SITE_OTHER): Payer: Medicare Other | Admitting: Interventional Cardiology

## 2014-05-19 ENCOUNTER — Encounter: Payer: Self-pay | Admitting: Interventional Cardiology

## 2014-05-19 VITALS — BP 110/70 | HR 78 | Ht 70.0 in | Wt 163.1 lb

## 2014-05-19 DIAGNOSIS — I480 Paroxysmal atrial fibrillation: Secondary | ICD-10-CM | POA: Diagnosis not present

## 2014-05-19 DIAGNOSIS — Z9889 Other specified postprocedural states: Secondary | ICD-10-CM | POA: Diagnosis not present

## 2014-05-19 DIAGNOSIS — Z8679 Personal history of other diseases of the circulatory system: Secondary | ICD-10-CM

## 2014-05-19 DIAGNOSIS — I495 Sick sinus syndrome: Secondary | ICD-10-CM

## 2014-05-19 NOTE — Patient Instructions (Signed)
Medication Instructions:  Your physician recommends that you continue on your current medications as directed. Please refer to the Current Medication list given to you today.  Labwork: No new orders.  Testing/Procedures: No new orders.  Follow-Up: Your physician wants you to follow-up in: 1 YEAR with Dr Tamala Julian.  You will receive a reminder letter in the mail two months in advance. If you don't receive a letter, please call our office to schedule the follow-up appointment.  Any Other Special Instructions Will Be Listed Below (If Applicable).

## 2014-05-19 NOTE — Progress Notes (Signed)
Cardiology Office Note   Date:  05/19/2014   ID:  Suzanne [name], DOB [birth year], MRN [id number]  PCP:  Gennette Pac, MD  Cardiologist:   Sinclair Grooms, MD   Chief Complaint  Patient presents with  . Atrial Flutter    Tachy Brady      History of Present Illness: Suzanne [name] is a 64 y.o. female who presents for atrial flutter/fibrillation. She is status post ablation in 2012. She did great for 3 years without any significant recurrence of sustained tachycardia. Within the past 6 months she has had at least 2 episodes of sustained tachycardia lasting up to 2 hours. She was offered an implantable loop recorder when seen by EP/Dr. Rayann Heman. She refused that. She will be followed up in 6 months.  She is sedentary. She mostly reads and does activities around the house. She denies orthopnea, PND, angina, ankle edema, and has not had syncope.    Past Medical History  Diagnosis Date  . Atrial fibrillation     s/p ablations  . Atrial flutter   . H/O: rheumatic fever 1957  . COPD (chronic obstructive pulmonary disease)   . Depression   . Migraine   . Frozen shoulder     Left  . History of asthma   . Degenerative arthritis   . PONV (postoperative nausea and vomiting)     only with Fentanyl and Dilaudid    Past Surgical History  Procedure Laterality Date  . Knee surgery  [phone]  . Rhinoplasty  age 89  . Pvi  2/11   & 6/11  . Cti ablation  3/11  . Tonsillectomy  as child  . Total abdominal hysterectomy  1990    right ovary removed  . 2 teeth extraction  2009  . Esophagogastroduodenoscopy (egd) with propofol N/A 01/06/2013    Procedure: ESOPHAGOGASTRODUODENOSCOPY (EGD) WITH PROPOFOL;  Surgeon: Cleotis Nipper, MD;  Location: [facility];  Service: Endoscopy;  Laterality: N/A;     Current Outpatient Prescriptions  Medication Sig Dispense Refill  . ALPRAZolam (XANAX) 0.5 MG tablet Take 1 tablet by mouth at bedtime as needed. Anxiety or sleep     . aspirin EC 81 MG tablet Take 81 mg by mouth daily.    Marland Kitchen aspirin-acetaminophen-caffeine (EXCEDRIN MIGRAINE) 250-250-65 MG per tablet Take 1 tablet by mouth every 6 (six) hours as needed for headache.    Marland Kitchen BOTOX 200 UNITS SOLR Take as directed by MD    . buPROPion (WELLBUTRIN XL) 300 MG 24 hr tablet Take 300 mg by mouth daily with breakfast.     . butalbital-acetaminophen-caffeine (FIORICET, ESGIC) 50-325-40 MG per tablet Take 1 tablet by mouth every 6 (six) hours as needed for headache or migraine. 90 tablet 3  . clobetasol ointment (TEMOVATE) 2.19 % Apply 1 application topically 2 (two) times a week.    . diltiazem (CARDIZEM) 30 MG tablet Take 30 mg by mouth daily as needed (palpitations).    Marland Kitchen ibuprofen (ADVIL,MOTRIN) 200 MG tablet Take 200-400 mg by mouth every 6 (six) hours as needed (Pain).    . promethazine (PHENERGAN) 25 MG tablet Take 1 tablet (25 mg total) by mouth every 6 (six) hours as needed for nausea or vomiting. 15 tablet 2  . sertraline (ZOLOFT) 100 MG tablet Take 100 mg by mouth daily with breakfast.     . SUMAtriptan (IMITREX) 100 MG tablet TAKE 1 TABLET BY MOUTH AT THE ONSET OF A HEADACHE DAILY AS NEEDED 27 tablet 3  .  SUMAtriptan (IMITREX) 6 MG/0.5ML SOLN injection Inject 0.5 mLs (6 mg total) into the skin every 2 (two) hours as needed for migraine or headache. F 12 vial 0   No current facility-administered medications for this visit.    Allergies:   Dilaudid; Fentanyl; Ciprofloxacin; and Erythromycin    Social History:  The patient  reports that she has been smoking.  She has never used smokeless tobacco. She reports that she drinks alcohol. She reports that she does not use illicit drugs.   Family History:  The patient's family history includes Cancer in her mother; Dementia in her father; Heart disease in her father; Migraines in her mother.    ROS:  Please see the history of present illness.   Otherwise, review of systems are positive for retired, anxiety, .   All  other systems are reviewed and negative.    PHYSICAL EXAM: VS:  BP 110/70 mmHg  Pulse 78  Ht 5\' 10"  (1.778 m)  Wt 163 lb 1.9 oz (73.991 kg)  BMI 23.41 kg/m2 , BMI Body mass index is 23.41 kg/(m^2). GEN: Well nourished, well developed, in no acute distress HEENT: normal Neck: no JVD, carotid bruits, or masses Cardiac: RRR; no murmurs, rubs, or gallops,no edema  Respiratory:  clear to auscultation bilaterally, normal work of breathing GI: soft, nontender, nondistended, + BS MS: no deformity or atrophy Skin: warm and dry, no rash Neuro:  Strength and sensation are intact Psych: euthymic mood, full affect   EKG:  EKG is not ordered today. The ekg ordered today demonstrates NSR with no abnormality on March 06, 2014.   Recent Labs: 12/02/2013: TSH 2.755    Lipid Panel No results found for: CHOL, TRIG, HDL, CHOLHDL, VLDL, LDLCALC, LDLDIRECT    Wt Readings from Last 3 Encounters:  05/19/14 163 lb 1.9 oz (73.991 kg)  04/19/14 164 lb (74.39 kg)  03/06/14 161 lb 12.8 oz (73.392 kg)      Other studies Reviewed: Additional studies/ records that were reviewed today include:    ASSESSMENT AND PLAN:  Sinoatrial node dysfunction: Over the past several months has had an episode or 2 of atrial fibrillation but none recently  S/P ablation of atrial fibrillation  Paroxysmal atrial fibrillation: As above     Current medicines are reviewed at length with the patient today.  The patient does not have concerns regarding medicines.  The following changes have been made:  no change  Labs/ tests ordered today include:  No orders of the defined types were placed in this encounter.     Disposition:   FU with Linard Millers in 12 months    Signed, Sinclair Grooms, MD  05/19/2014 4:11 PM    [facility] [place], [place], [place]  [address] Phone: [phone]; Fax: [phone]

## 2014-08-16 ENCOUNTER — Telehealth: Payer: Self-pay

## 2014-08-18 NOTE — Telephone Encounter (Signed)
Spoke to patient about her upcoming Botox appt with Dr. Jannifer Franklin. Explained botox injections for her type insurance are being performed by [facility] and would send info to have patient scheduled there. Patient verbalized understanding.

## 2014-08-21 ENCOUNTER — Ambulatory Visit: Payer: Medicare Other | Admitting: Neurology

## 2014-09-13 ENCOUNTER — Telehealth: Payer: Self-pay

## 2014-09-13 NOTE — Telephone Encounter (Signed)
LVM for pt to call office to confirm appointment with Dr. Jannifer Franklin 09/27/2014 @ 12.

## 2014-09-13 NOTE — Telephone Encounter (Signed)
LVM for pt, OK to inform her that after her Botox injection with Dr. Jannifer Franklin she will receive her next injection at [facility] located at [address] [id number] with Dr. Letta Pate Dec 26, 2014 @ 1130 please arrive 15 minutes early.

## 2014-09-13 NOTE — Telephone Encounter (Signed)
Spoke with patient and confirmed that she will come in and see Dr. Jannifer Franklin 09/27/2014 @12  for her Botox injections, also explained that her following injections would be administered at Dr. Letta Pate office.  She verbalized understanding

## 2014-09-19 NOTE — Telephone Encounter (Addendum)
Called patient to let her know [facility] will be contacting her to confirm Botox shipment to our office. Patient stated she confirmed shipment w/ [facility] through their automated system this morning.

## 2014-09-25 ENCOUNTER — Telehealth: Payer: Self-pay

## 2014-09-25 NOTE — Telephone Encounter (Signed)
Tried calling patient to inform [facility] is trying to reach her to pay a copay for Botox Shipment. She would need to speak w/ [facility] @ [phone].

## 2014-09-27 ENCOUNTER — Encounter: Payer: Self-pay | Admitting: Neurology

## 2014-09-27 ENCOUNTER — Ambulatory Visit (INDEPENDENT_AMBULATORY_CARE_PROVIDER_SITE_OTHER): Payer: Medicare Other | Admitting: Neurology

## 2014-09-27 VITALS — BP 110/69 | HR 67

## 2014-09-27 DIAGNOSIS — G43719 Chronic migraine without aura, intractable, without status migrainosus: Secondary | ICD-10-CM

## 2014-09-27 NOTE — Procedures (Signed)
     BOTOX PROCEDURE NOTE FOR MIGRAINE HEADACHE   HISTORY: Suzanne [name] is a 64 year old patient with a history of migraine headaches. The patient has been on Botox injection protocol for over a year, she has gained good improvement with the therapy. She no longer has frequent prolonged headaches lasting 4 or 5 days. She did have one such event in April after a trip to [place]. The patient has had 2 headaches in June and 2 headaches in July that required the use of Imitrex. In general, she is getting headaches occurring 10 days out of a month. The headaches are much less severe than her usual. The worst headaches now are on a scale of 4 or 5 out of 10 on the pain scale, previously, her headaches for 8 or 9. The patient believes that the Botox has significantly improved her life. She comes in today for another Botox therapy.   Description of procedure:  The patient was placed in a sitting position. The standard protocol was used for Botox as follows, with 5 units of Botox injected at each site:   -Procerus muscle, midline injection  -Corrugator muscle, bilateral injection  -Frontalis muscle, bilateral injection, with 2 sites each side, medial injection was performed in the upper one third of the frontalis muscle, in the region vertical from the medial inferior edge of the superior orbital rim. The lateral injection was again in the upper one third of the forehead vertically above the lateral limbus of the cornea, 1.5 cm lateral to the medial injection site.  -Temporalis muscle injection, 4 sites, bilaterally. The first injection was 3 cm above the tragus of the ear, second injection site was 1.5 cm to 3 cm up from the first injection site in line with the tragus of the ear. The third injection site was 1.5-3 cm forward between the first 2 injection sites. The fourth injection site was 1.5 cm posterior to the second injection site.  -Occipitalis muscle injection, 3 sites, bilaterally. The  first injection was done one half way between the occipital protuberance and the tip of the mastoid process behind the ear. The second injection site was done lateral and superior to the first, 1 fingerbreadth from the first injection. The third injection site was 1 fingerbreadth superiorly and medially from the first injection site.  -Cervical paraspinal muscle injection, 2 sites, bilateral, the first injection site was 1 cm from the midline of the cervical spine, 3 cm inferior to the lower border of the occipital protuberance. The second injection site was 1.5 cm superiorly and laterally to the first injection site.  -Trapezius muscle injection was performed at 3 sites, bilaterally. The first injection site was in the upper trapezius muscle halfway between the inflection point of the neck, and the acromion. The second injection site was one half way between the acromion and the first injection site. The third injection was done between the first injection site and the inflection point of the neck.   A 200 unit bottle of Botox was used, 155 units were injected, the rest of the Botox was wasted. The patient tolerated the procedure well, there were no complications of the above procedure.  Botox NDC 6073-7106-26 Lot number [id number] Expiration date April 2019

## 2014-11-24 ENCOUNTER — Ambulatory Visit (INDEPENDENT_AMBULATORY_CARE_PROVIDER_SITE_OTHER): Payer: Medicare Other | Admitting: Neurology

## 2014-11-24 ENCOUNTER — Encounter: Payer: Self-pay | Admitting: Neurology

## 2014-11-24 VITALS — BP 106/56 | HR 74 | Ht 67.0 in | Wt 163.5 lb

## 2014-11-24 DIAGNOSIS — G43719 Chronic migraine without aura, intractable, without status migrainosus: Secondary | ICD-10-CM | POA: Diagnosis not present

## 2014-11-24 NOTE — Progress Notes (Signed)
Reason for visit: Migraine headache  Suzanne [name] is an 64 y.o. female  History of present illness:  Suzanne [name] is a 64 year old right-handed white female with a history of intractable migraine. The patient still having relatively frequent headaches, but the headaches are often times occurring at nighttime, and are relatively mild. The patient may take Tylenol or Fioricet which seems to help the headache. The patient can get rid of the headache if she can get back to sleep. The patient is having many fewer severe headaches that require Imitrex. Imitrex may work within 45-60 minutes. These headaches often times occur during the daytime. The patient has used only one Imitrex tablet over the last 2 weeks. The patient is getting Botox treatments which are helpful. The treatments have reduced the number of severe disabling headaches that she has, it has not impacted the milder headaches as much. The patient returns to this office for further evaluation. She continues to get Botox injections. Weather changes are a big activator for her headaches.  Past Medical History  Diagnosis Date  . Atrial fibrillation ([place])     s/p ablations  . Atrial flutter ([place])   . H/O: rheumatic fever 1957  . COPD (chronic obstructive pulmonary disease) ([place])   . Depression   . Migraine   . Frozen shoulder     Left  . History of asthma   . Degenerative arthritis   . PONV (postoperative nausea and vomiting)     only with Fentanyl and Dilaudid    Past Surgical History  Procedure Laterality Date  . Knee surgery  [phone]  . Rhinoplasty  age 27  . Pvi  2/11   & 6/11  . Cti ablation  3/11  . Tonsillectomy  as child  . Total abdominal hysterectomy  1990    right ovary removed  . 2 teeth extraction  2009  . Esophagogastroduodenoscopy (egd) with propofol N/A 01/06/2013    Procedure: ESOPHAGOGASTRODUODENOSCOPY (EGD) WITH PROPOFOL;  Surgeon: Cleotis Nipper, MD;  Location: [facility];   Service: Endoscopy;  Laterality: N/A;    Family History  Problem Relation Age of Onset  . Cancer Mother     renal cell  . Migraines Mother   . Dementia Father   . Heart disease Father     Social history:  reports that she has been smoking.  She has never used smokeless tobacco. She reports that she drinks alcohol. She reports that she does not use illicit drugs.    Allergies  Allergen Reactions  . Dilaudid [Hydromorphone Hcl] Nausea And Vomiting  . Fentanyl Nausea And Vomiting  . Ciprofloxacin Rash  . Erythromycin Rash    Medications:  Prior to Admission medications   Medication Sig Start Date End Date Taking? Authorizing Provider  ALPRAZolam Duanne Moron) 0.5 MG tablet Take 1 tablet by mouth at bedtime as needed. Anxiety or sleep 12/21/13  Yes Historical Provider, MD  aspirin EC 81 MG tablet Take 81 mg by mouth daily.   Yes Historical Provider, MD  aspirin-acetaminophen-caffeine (EXCEDRIN MIGRAINE) [phone] MG per tablet Take 1 tablet by mouth every 6 (six) hours as needed for headache.   Yes Historical Provider, MD  BOTOX 200 UNITS SOLR Take as directed by MD 12/07/13  Yes Historical Provider, MD  buPROPion (WELLBUTRIN XL) 300 MG 24 hr tablet Take 300 mg by mouth daily with breakfast.    Yes Historical Provider, MD  butalbital-acetaminophen-caffeine (FIORICET, ESGIC) 50-325-40 MG per tablet Take 1 tablet by mouth every 6 (  six) hours as needed for headache or migraine. 05/17/14  Yes Kathrynn Ducking, MD  clobetasol ointment (TEMOVATE) 2.94 % Apply 1 application topically 2 (two) times a week.   Yes Historical Provider, MD  diltiazem (CARDIZEM) 30 MG tablet Take 30 mg by mouth daily as needed (palpitations). 10/03/13  Yes Imogene Burn, PA-C  ibuprofen (ADVIL,MOTRIN) 200 MG tablet Take 200-400 mg by mouth every 6 (six) hours as needed (Pain).   Yes Historical Provider, MD  promethazine (PHENERGAN) 25 MG tablet Take 1 tablet (25 mg total) by mouth every 6 (six) hours as needed for nausea  or vomiting. 05/17/14  Yes Kathrynn Ducking, MD  sertraline (ZOLOFT) 100 MG tablet Take 100 mg by mouth daily with breakfast.    Yes Historical Provider, MD  SUMAtriptan (IMITREX) 100 MG tablet TAKE 1 TABLET BY MOUTH AT THE ONSET OF A HEADACHE DAILY AS NEEDED 05/17/14  Yes Kathrynn Ducking, MD  SUMAtriptan (IMITREX) 6 MG/0.5ML SOLN injection Inject 0.5 mLs (6 mg total) into the skin every 2 (two) hours as needed for migraine or headache. F 09/23/13  Yes Kathrynn Ducking, MD    ROS:  Out of a complete 14 system review of symptoms, the patient complains only of the following symptoms, and all other reviewed systems are negative.  Cough Headache  Blood pressure 106/56, pulse 74, height 5\' 7"  (1.702 m), weight 163 lb 8 oz (74.163 kg).  Physical Exam  General: The patient is alert and cooperative at the time of the examination.  Skin: No significant peripheral edema is noted.   Neurologic Exam  Mental status: The patient is alert and oriented x 3 at the time of the examination. The patient has apparent normal recent and remote memory, with an apparently normal attention span and concentration ability.   Cranial nerves: Facial symmetry is present. Speech is normal, no aphasia or dysarthria is noted. Extraocular movements are full. Visual fields are full.  Motor: The patient has good strength in all 4 extremities.  Sensory examination: Soft touch sensation is symmetric on the face, arms, and legs.  Coordination: The patient has good finger-nose-finger and heel-to-shin bilaterally.  Gait and station: The patient has a normal gait. Tandem gait is normal. Romberg is negative. No drift is seen.  Reflexes: Deep tendon reflexes are symmetric.   Assessment/Plan:  1. Intractable migraine  The patient is gaining benefit from the Botox, she will continue the treatments for now. The patient will continue taking Imitrex if needed, she will follow-up in 6 months.  Jill Alexanders  MD 11/24/2014 8:18 AM  [facility] [address] [place], [place] [address]  Phone [phone] Fax [phone]

## 2014-12-20 NOTE — Telephone Encounter (Signed)
Patient called and stated that the pharmacy had called her and told her they needed a form from our office. Patient receives injections from Dr. Read Drivers so will forward form to his office.  Spoke with Corene Cornea and sent him paperwork. He stated that he believes we can take this patient back in our office. I told the patient I would find out and let her know.

## 2014-12-26 ENCOUNTER — Ambulatory Visit (HOSPITAL_BASED_OUTPATIENT_CLINIC_OR_DEPARTMENT_OTHER): Payer: Medicare Other | Admitting: Physical Medicine & Rehabilitation

## 2014-12-26 ENCOUNTER — Encounter: Payer: Medicare Other | Attending: Physical Medicine & Rehabilitation

## 2014-12-26 ENCOUNTER — Encounter: Payer: Self-pay | Admitting: Physical Medicine & Rehabilitation

## 2014-12-26 VITALS — BP 135/80 | HR 68

## 2014-12-26 DIAGNOSIS — G43719 Chronic migraine without aura, intractable, without status migrainosus: Secondary | ICD-10-CM | POA: Insufficient documentation

## 2014-12-26 NOTE — Progress Notes (Signed)
Botox Injection for chronic migraine headaches ICD 10: G43.719   Dilution: 100 Units/ 34ml preservative free NS Indication: refractory headaches (At least 15 days per month/headache lasting greater than 4 hours per day) incompletely responsive to other more conservative measures.  Informed consent was obtained after describing risks and benefits of the procedure with the patient. This includes bleeding, bruising, infection, excessive weakness, or medication side effects. A REMS form is on file and signed. Needle: 30g 1/2 inch needle   Number of units per muscle:  Right temporalis 10 units, 2 access points Left temporalis 10 units,  2 access points Right frontalis 10 units, 2 access points Left frontalis 10 units, 2 access points Procerus 5 units, 1 access point Right corrugator 5 units, 1 access point Left corrugator 5 units, 1 access point Right occipitalis 15 units, 3 access points Left occipitalis 15 units, 3 access points Right cervical paraspinal 10 units, 2 access points Left cervical paraspinals 10 units, 2 access points  Right trapezius 15 units, 3 access points Left trapezius 15 units, 3 access points       All injections were done after  after negative drawback for blood. The patient tolerated the procedure well. Post procedure instructions were given. A followup appointment was made.

## 2014-12-26 NOTE — Patient Instructions (Addendum)
You received a botulinum toxin injection for chronic headache prevention. You may have soreness in your injection site, you may use ice for 20-30 minutes every 2 hours to help with soreness.  Maximum effect of the injection will be at around 1 week. Other side effects include possible muscle weakness which will subside as the medication wears off.  Medication may be repeated every 3 months as needed.   Other medicine is Dysport

## 2015-01-17 NOTE — Telephone Encounter (Signed)
Called patient and spoke to her she is going to keep her apt. With Dr. Shawnie Pons.  . Botox injection.

## 2015-01-22 ENCOUNTER — Other Ambulatory Visit: Payer: Self-pay

## 2015-01-22 DIAGNOSIS — Z1231 Encounter for screening mammogram for malignant neoplasm of breast: Secondary | ICD-10-CM

## 2015-02-07 ENCOUNTER — Ambulatory Visit
Admission: RE | Admit: 2015-02-07 | Discharge: 2015-02-07 | Disposition: A | Discharge status: Home / Self Care | Payer: Medicare Other | Source: Ambulatory Visit

## 2015-02-07 DIAGNOSIS — Z1231 Encounter for screening mammogram for malignant neoplasm of breast: Secondary | ICD-10-CM

## 2015-02-21 MED FILL — ALPRAZolam 0.5 MG TABS: 0.5 | 90 days supply | Qty: 270 | Fill #0

## 2015-03-14 ENCOUNTER — Other Ambulatory Visit: Payer: Self-pay

## 2015-03-14 ENCOUNTER — Ambulatory Visit (HOSPITAL_BASED_OUTPATIENT_CLINIC_OR_DEPARTMENT_OTHER)
Admission: RE | Admit: 2015-03-14 | Discharge: 2015-03-14 | Disposition: A | Discharge status: Home / Self Care | Payer: Medicare Other | Source: Ambulatory Visit | Attending: Nurse Practitioner | Admitting: Nurse Practitioner

## 2015-03-14 ENCOUNTER — Encounter (HOSPITAL_COMMUNITY): Payer: Self-pay | Admitting: Nurse Practitioner

## 2015-03-14 ENCOUNTER — Emergency Department (HOSPITAL_COMMUNITY): Payer: Medicare Other

## 2015-03-14 ENCOUNTER — Encounter (HOSPITAL_COMMUNITY): Payer: Self-pay | Admitting: *Deleted

## 2015-03-14 ENCOUNTER — Emergency Department (HOSPITAL_COMMUNITY)
Admission: EM | Admit: 2015-03-14 | Discharge: 2015-03-14 | Disposition: A | Discharge status: Home / Self Care | Payer: Medicare Other | Attending: Emergency Medicine | Admitting: Emergency Medicine

## 2015-03-14 VITALS — BP 116/82 | HR 160 | Ht 67.0 in | Wt 158.6 lb

## 2015-03-14 DIAGNOSIS — Z7982 Long term (current) use of aspirin: Secondary | ICD-10-CM

## 2015-03-14 DIAGNOSIS — R42 Dizziness and giddiness: Secondary | ICD-10-CM | POA: Diagnosis present

## 2015-03-14 DIAGNOSIS — I4892 Unspecified atrial flutter: Secondary | ICD-10-CM

## 2015-03-14 DIAGNOSIS — F17200 Nicotine dependence, unspecified, uncomplicated: Secondary | ICD-10-CM | POA: Diagnosis not present

## 2015-03-14 DIAGNOSIS — I480 Paroxysmal atrial fibrillation: Secondary | ICD-10-CM | POA: Diagnosis not present

## 2015-03-14 DIAGNOSIS — J449 Chronic obstructive pulmonary disease, unspecified: Secondary | ICD-10-CM

## 2015-03-14 DIAGNOSIS — Z885 Allergy status to narcotic agent status: Secondary | ICD-10-CM | POA: Insufficient documentation

## 2015-03-14 DIAGNOSIS — Z8249 Family history of ischemic heart disease and other diseases of the circulatory system: Secondary | ICD-10-CM

## 2015-03-14 DIAGNOSIS — F329 Major depressive disorder, single episode, unspecified: Secondary | ICD-10-CM | POA: Insufficient documentation

## 2015-03-14 DIAGNOSIS — G43909 Migraine, unspecified, not intractable, without status migrainosus: Secondary | ICD-10-CM | POA: Diagnosis not present

## 2015-03-14 DIAGNOSIS — Z8619 Personal history of other infectious and parasitic diseases: Secondary | ICD-10-CM | POA: Insufficient documentation

## 2015-03-14 DIAGNOSIS — Z7901 Long term (current) use of anticoagulants: Secondary | ICD-10-CM | POA: Insufficient documentation

## 2015-03-14 DIAGNOSIS — Z881 Allergy status to other antibiotic agents status: Secondary | ICD-10-CM | POA: Insufficient documentation

## 2015-03-14 DIAGNOSIS — Z8739 Personal history of other diseases of the musculoskeletal system and connective tissue: Secondary | ICD-10-CM | POA: Insufficient documentation

## 2015-03-14 DIAGNOSIS — J441 Chronic obstructive pulmonary disease with (acute) exacerbation: Secondary | ICD-10-CM | POA: Diagnosis not present

## 2015-03-14 DIAGNOSIS — M1990 Unspecified osteoarthritis, unspecified site: Secondary | ICD-10-CM | POA: Insufficient documentation

## 2015-03-14 DIAGNOSIS — Z79899 Other long term (current) drug therapy: Secondary | ICD-10-CM | POA: Insufficient documentation

## 2015-03-14 DIAGNOSIS — J45909 Unspecified asthma, uncomplicated: Secondary | ICD-10-CM | POA: Insufficient documentation

## 2015-03-14 DIAGNOSIS — F17210 Nicotine dependence, cigarettes, uncomplicated: Secondary | ICD-10-CM | POA: Insufficient documentation

## 2015-03-14 LAB — BASIC METABOLIC PANEL
ANION GAP: 10 (ref 5–15)
BUN: 14 mg/dL (ref 6–20)
CALCIUM: 9.8 mg/dL (ref 8.9–10.3)
CO2: 24 mmol/L (ref 22–32)
CREATININE: 0.78 mg/dL (ref 0.44–1.00)
Chloride: 105 mmol/L (ref 101–111)
GFR calc Af Amer: >60 mL/min (ref >60)
GLUCOSE: 101 mg/dL — AB (ref 65–99)
Potassium: 4.2 mmol/L (ref 3.5–5.1)
Sodium: 139 mmol/L (ref 135–145)

## 2015-03-14 LAB — CBC
HCT: 44 % (ref 36.0–46.0)
HEMOGLOBIN: 14.4 g/dL (ref 12.0–15.0)
MCH: 30.8 pg (ref 26.0–34.0)
MCHC: 32.7 g/dL (ref 30.0–36.0)
MCV: 94.2 fL (ref 78.0–100.0)
PLATELETS: 204 10*3/uL (ref 150–400)
RBC: 4.67 MIL/uL (ref 3.87–5.11)
RDW: 13.1 % (ref 11.5–15.5)
WBC: 9.9 10*3/uL (ref 4.0–10.5)

## 2015-03-14 LAB — I-STAT TROPONIN, ED: TROPONIN I, POC: 0 ng/mL (ref 0.00–0.08)

## 2015-03-14 MED ORDER — DEXTROSE 5 % IV SOLN
5.0000 mg/h | INTRAVENOUS | Status: DC
  Administered 2015-03-14: 5 mg/h via INTRAVENOUS
  Filled 2015-03-14: qty 100

## 2015-03-14 MED ORDER — DILTIAZEM LOAD VIA INFUSION
10.0000 mg | Freq: Once | INTRAVENOUS | Status: AC
  Administered 2015-03-14: 10 mg via INTRAVENOUS
  Filled 2015-03-14: qty 10

## 2015-03-14 MED ORDER — APIXABAN 5 MG PO TABS: 5.0000 mg | ORAL_TABLET | Freq: Two times a day (BID) | ORAL | Status: DC

## 2015-03-14 NOTE — ED Provider Notes (Signed)
CSN: [id number]     Arrival date & time 03/14/15  1454 History   First MD Initiated Contact with Patient 03/14/15 1557     Chief Complaint  Patient presents with  . Atrial Fibrillation  . Dizziness     (Consider location/radiation/quality/duration/timing/severity/associated sxs/prior Treatment) HPI Comments: Patient presents from cardiology clinic with rapid atrial fibrillation since about 9 AM. She states she woke up feeling normal but then developed palpitations, shortness of breath and dizziness. She's had atrial fibrillation in the past with multiple ablations. Denies chest pain. Sent to the ED from the clinic. She does not take any rate controlling medications at home. She is not anticoagulated. She denies any leg pain or leg swelling. No abdominal pain, nausea or vomiting. No fever or cough.  Patient is a 65 y.o. female presenting with atrial fibrillation and dizziness. The history is provided by the patient.  Atrial Fibrillation Associated symptoms include shortness of breath. Pertinent negatives include no chest pain and no abdominal pain.  Dizziness Associated symptoms: shortness of breath   Associated symptoms: no chest pain, no nausea, no vomiting and no weakness     Past Medical History  Diagnosis Date  . Atrial fibrillation ([place])     s/p ablations  . Atrial flutter ([place])   . H/O: rheumatic fever 1957  . COPD (chronic obstructive pulmonary disease) ([place])   . Depression   . Migraine   . Frozen shoulder     Left  . History of asthma   . Degenerative arthritis   . PONV (postoperative nausea and vomiting)     only with Fentanyl and Dilaudid   Past Surgical History  Procedure Laterality Date  . Knee surgery  [phone]  . Rhinoplasty  age 34  . Pvi  2/11   & 6/11  . Cti ablation  3/11  . Tonsillectomy  as child  . Total abdominal hysterectomy  1990    right ovary removed  . 2 teeth extraction  2009  . Esophagogastroduodenoscopy (egd) with propofol N/A  01/06/2013    Procedure: ESOPHAGOGASTRODUODENOSCOPY (EGD) WITH PROPOFOL;  Surgeon: Cleotis Nipper, MD;  Location: [facility];  Service: Endoscopy;  Laterality: N/A;   Family History  Problem Relation Age of Onset  . Cancer Mother     renal cell  . Migraines Mother   . Dementia Father   . Heart disease Father    Social History  Substance Use Topics  . Smoking status: Current Every Day Smoker -- 0.50 packs/day for 40 years  . Smokeless tobacco: Never Used  . Alcohol Use: Yes     Comment: 1-2 glasses of wine per year   OB History    No data available     Review of Systems  Constitutional: Positive for fatigue. Negative for fever, activity change and appetite change.  HENT: Negative for congestion.   Respiratory: Positive for shortness of breath. Negative for cough and chest tightness.   Cardiovascular: Negative for chest pain.  Gastrointestinal: Negative for nausea, vomiting and abdominal pain.  Genitourinary: Negative for dysuria, urgency, decreased urine volume, vaginal bleeding and vaginal discharge.  Musculoskeletal: Negative for myalgias, back pain and arthralgias.  Skin: Negative for rash.  Neurological: Positive for dizziness. Negative for weakness, light-headedness and numbness.  A complete 10 system review of systems was obtained and all systems are negative except as noted in the HPI and PMH.      Allergies  Dilaudid; Fentanyl; Ciprofloxacin; and Erythromycin  Home Medications   Prior to Admission  medications   Medication Sig Start Date End Date Taking? Authorizing Provider  ALPRAZolam Duanne Moron) 0.5 MG tablet Take 0.5 mg by mouth 2 (two) times daily as needed for anxiety or sleep. Anxiety or sleep 12/21/13  Yes Historical Provider, MD  aspirin-acetaminophen-caffeine (EXCEDRIN MIGRAINE) [phone] MG per tablet Take 1 tablet by mouth every 6 (six) hours as needed for headache.   Yes Historical Provider, MD  BOTOX 200 UNITS SOLR Take as directed by MD 12/07/13  Yes  Historical Provider, MD  buPROPion (WELLBUTRIN XL) 300 MG 24 hr tablet Take 300 mg by mouth daily with breakfast.    Yes Historical Provider, MD  butalbital-acetaminophen-caffeine (FIORICET, ESGIC) 50-325-40 MG per tablet Take 1 tablet by mouth every 6 (six) hours as needed for headache or migraine. 05/17/14  Yes Kathrynn Ducking, MD  promethazine (PHENERGAN) 25 MG tablet Take 1 tablet (25 mg total) by mouth every 6 (six) hours as needed for nausea or vomiting. 05/17/14  Yes Kathrynn Ducking, MD  sertraline (ZOLOFT) 100 MG tablet Take 100 mg by mouth daily with breakfast.    Yes Historical Provider, MD  SUMAtriptan (IMITREX) 100 MG tablet TAKE 1 TABLET BY MOUTH AT THE ONSET OF A HEADACHE DAILY AS NEEDED 05/17/14  Yes Kathrynn Ducking, MD  apixaban (ELIQUIS) 5 MG TABS tablet Take 1 tablet (5 mg total) by mouth 2 (two) times daily. 03/14/15   Ezequiel Essex, MD   BP 111/69 mmHg  Pulse 89  Temp(Src) 97.9 F (36.6 C) (Oral)  Resp 12  SpO2 95% Physical Exam  Constitutional: She is oriented to person, place, and time. She appears well-developed and well-nourished. No distress.  HENT:  Head: Normocephalic and atraumatic.  Mouth/Throat: Oropharynx is clear and moist. No oropharyngeal exudate.  Eyes: Conjunctivae and EOM are normal. Pupils are equal, round, and reactive to light.  Neck: Normal range of motion. Neck supple.  Cardiovascular: Normal rate and normal heart sounds.   No murmur heard. Irregular rhythm  Pulmonary/Chest: Effort normal and breath sounds normal. No respiratory distress. She exhibits no tenderness.  Abdominal: Soft. There is no tenderness. There is no rebound and no guarding.  Musculoskeletal: Normal range of motion. She exhibits no edema or tenderness.  Neurological: She is alert and oriented to person, place, and time. No cranial nerve deficit. She exhibits normal muscle tone. Coordination normal.  Skin: Skin is warm. No rash noted.  Nursing note and vitals reviewed.   ED  Course  Procedures (including critical care time) Labs Review Labs Reviewed  BASIC METABOLIC PANEL - Abnormal; Notable for the following:    Glucose, Bld 101 (*)    All other components within normal limits  CBC  I-STAT TROPOININ, ED    Imaging Review Dg Chest 2 View  03/14/2015  CLINICAL DATA:  Tachycardia and SOB started today - pt has hx of cardiac issues (AFIB, atrial flutter, 3 Ablations), also a hx of COPD, smoker, rheumatic fever EXAM: CHEST  2 VIEW COMPARISON:  04/12/2009 FINDINGS: Normal cardiac silhouette with hyperinflated lungs. No effusion, infiltrate or pneumothorax. No acute osseous abnormality. IMPRESSION: Hyperinflated lungs without acute findings. Electronically Signed   By: Suzy Bouchard M.D.   On: 03/14/2015 15:44   I have personally reviewed and evaluated these images and lab results as part of my medical decision-making.   EKG Interpretation   Date/Time:  Wednesday March 14 2015 17:57:19 EST Ventricular Rate:  60 PR Interval:  152 QRS Duration: 88 QT Interval:  410 QTC Calculation: 410 R  Axis:   52 Text Interpretation:  Sinus rhythm Low voltage, precordial leads now sinus  rhythm  Confirmed by Wyvonnia Dusky  MD, Tempie Gibeault [phone]) on 03/14/2015 6:13:53 PM      MDM   Final diagnoses:  Paroxysmal atrial fibrillation (HCC)   Atrial fibrillation with RVR since approximately 9 AM. Patient is not anticoagulated. She was offered electrical cardioversion which she declines. 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  Patient with rate controlled atrial fibrillation/flutter. Labs are unremarkable.  Discussed with cardiology. Dr. Martinique at bedside. Patient did eventually agree to DC cardioversion. While preparing for procedure, she spontaneously converted to sinus rhythm.  Her symptoms have resolved. No chest pain or shortness of breath. Per cardiology recommendation she will be started  on eliquis twice a day. She has Cardizem at home as needed for rate control. Follow-up with atrial fibrillation clinic. Return precautions discussed.  Ezequiel Essex, MD 03/15/15 [phone]

## 2015-03-14 NOTE — H&P (Signed)
Primary Care Physician: Gennette Pac, MD Referring Physician: Dr. Rayann Heman Primary cardiologist:  Dr. Tamala Julian      HISTORY AND PHYSICAL   Suzanne [name] is a 65 y.o. female with a h/o afib ablation x2 that was in her USOH until this am when felt her heart rate start racing and noted shortness of breath. She has had some brief episodes over the years but when it was sustained called and asked for an appointment wit the afib clinic.  She was seen in a fib clinic with EKG shows a probable aflutter with a v rate of 160 bpm. She is tolerating well but has a fairly soft BP of [id number] systolic. She has deferred blood thinners in the past for a chadsvasc score of 2. She is not on daily rate control meds.  She did have brief episode on Monday.   She was sent from A fib clinic to ER and she has had IV dilt 10 mg bolus and now on drip at 5 mg/hr.  HR is now controlled.   EKGs  A flutter. K+ stable, troponin neg.  CXR with hyperinflated lungs no acute findings.  Resting comfortably now.      Past Medical History  Diagnosis Date  . Atrial fibrillation ([place])     s/p ablations  . Atrial flutter ([place])   . H/O: rheumatic fever 1957  . COPD (chronic obstructive pulmonary disease) ([place])   . Depression   . Migraine   . Frozen shoulder     Left  . History of asthma   . Degenerative arthritis   . PONV (postoperative nausea and vomiting)     only with Fentanyl and Dilaudid   Past Surgical History  Procedure Laterality Date  . Knee surgery  [phone]  . Rhinoplasty  age 28  . Pvi  2/11 & 6/11  . Cti ablation  3/11  . Tonsillectomy  as child  . Total abdominal hysterectomy  1990    right ovary removed  . 2 teeth extraction  2009  . Esophagogastroduodenoscopy (egd) with propofol N/A 01/06/2013    Procedure: ESOPHAGOGASTRODUODENOSCOPY (EGD) WITH PROPOFOL; Surgeon: Cleotis Nipper, MD; Location:  [facility]; Service: Endoscopy; Laterality: N/A;    Current Outpatient Prescriptions  Medication Sig Dispense Refill  . ALPRAZolam (XANAX) 0.5 MG tablet Take 1 tablet by mouth at bedtime as needed. Anxiety or sleep    . aspirin EC 81 MG tablet Take 81 mg by mouth daily.    Marland Kitchen aspirin-acetaminophen-caffeine (EXCEDRIN MIGRAINE) 250-250-65 MG per tablet Take 1 tablet by mouth every 6 (six) hours as needed for headache.    Marland Kitchen BOTOX 200 UNITS SOLR Take as directed by MD    . buPROPion (WELLBUTRIN XL) 300 MG 24 hr tablet Take 300 mg by mouth daily with breakfast.     . butalbital-acetaminophen-caffeine (FIORICET, ESGIC) 50-325-40 MG per tablet Take 1 tablet by mouth every 6 (six) hours as needed for headache or migraine. 90 tablet 3  . ibuprofen (ADVIL,MOTRIN) 200 MG tablet Take 200-400 mg by mouth every 6 (six) hours as needed (Pain).    . promethazine (PHENERGAN) 25 MG tablet Take 1 tablet (25 mg total) by mouth every 6 (six) hours as needed for nausea or vomiting. 15 tablet 2  . sertraline (ZOLOFT) 100 MG tablet Take 100 mg by mouth daily with breakfast.     . SUMAtriptan (IMITREX) 100 MG tablet TAKE 1 TABLET BY MOUTH AT THE ONSET OF A HEADACHE DAILY AS NEEDED 27 tablet 3  No current facility-administered medications for this encounter.    Allergies  Allergen Reactions  . Dilaudid [Hydromorphone Hcl] Nausea And Vomiting  . Fentanyl Nausea And Vomiting  . Ciprofloxacin Rash  . Erythromycin Rash    Social History   Social History  . Marital Status: Married    Spouse Name: N/A  . Number of Children: 0  . Years of Education: 16   Occupational History  . 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 [employer]   Social History Main Topics  . Smoking status: Current Every Day Smoker -- 0.50 packs/day for 40 years  . Smokeless tobacco: Never  Used  . Alcohol Use: Yes     Comment: 1-2 glasses of wine per year  . Drug Use: No  . Sexual Activity: Not on file   Other Topics Concern  . Not on file   Social History Narrative   Patient is right handed.   Patient drinks one cup caffeine.    Family History  Problem Relation Age of Onset  . Cancer Mother     renal cell  . Migraines Mother   . Dementia Father   . Heart disease Father     ROS- General:no colds or fevers, no weight changes Skin:no rashes or ulcers HEENT:no blurred vision, no congestion CV:see HPI PUL:see HPI GI:no diarrhea constipation or melena, no indigestion GU:no hematuria, no dysuria MS:no joint pain, no claudication Neuro:no syncope, no lightheadedness Endo:no diabetes, no thyroid disease  BP 114/44 mmHg  Pulse 92  Temp(Src) 97.9 F (36.6 C) (Oral)  Resp 14  SpO2 96%   Physical Exam: General:Pleasant affect, NAD Skin:Warm and dry, brisk capillary refill HEENT:normocephalic, sclera clear, mucus membranes moist Neck:supple, no JVD, no bruits  Heart:  irreg irreg rate controlled,on IV dilt  without murmur, gallup, rub or click Lungs:clear without rales, rhonchi, or wheezes [id number], non tender, + BS, do not palpate liver spleen or masses Ext:no lower ext edema, 2+ pedal pulses, 2+ radial pulses Neuro:alert and oriented, MAE, follows commands, + facial symmetry    EKG- Aflutter with v rate of 160 bpm, RBBB Epic records reviewed  Assessment and Plan: 1. Paroxysmal a flutter  and not on blood thinners Have a short window to convert pt back to SR--went into a flutter at 0900  Discussed cardioversion/cardizem IV and she would prefer cardizem  Dr. Martinique to see.       Patient seen and examined and history reviewed. Agree with above findings and plan. 65 yo WF with above history presents with tachycardia beginning at 9 am. Pulse very fast and irregular. Initial Ecg showed atrial flutter  with rate 160. In ED went into atrial fibrillation. Rate controlled on IV cardizem. Discussed DCCV per protocol. While discussing sedation patient converted to NSR with a 2.5 second pause. Now in NSR. Otherwise labs ok.  Will plan DC home today. She has cardizem to take prn recurrence. Will need to take a NOAC for at least one month. Will arrange follow up in Afib clinic. Will need game plan in case this recurs ? Trial of AAD versus repeat ablation. She is s/p ablation x 3 in 2011. She has tried antiarrhythmic therapy with flecainide and amiodarone. In the past.  Maddoxx Burkitt Martinique, [place] 03/14/2015 5:58 PM

## 2015-03-14 NOTE — ED Notes (Signed)
Pt has been transported to radiology. Radiology informed of patient room assignment.

## 2015-03-14 NOTE — Discharge Instructions (Signed)
Atrial Fibrillation Follow up with the A fib clinic. Monitor for bleeding while taking eliquis. Return to the ED if you develop new or worsening symptoms. Atrial fibrillation is a type of irregular or rapid heartbeat (arrhythmia). In atrial fibrillation, the heart quivers continuously in a chaotic pattern. This occurs when parts of the heart receive disorganized signals that make the heart unable to pump blood normally. This can increase the risk for stroke, heart failure, and other heart-related conditions. There are different types of atrial fibrillation, including:  Paroxysmal atrial fibrillation. This type starts suddenly, and it usually stops on its own shortly after it starts.  Persistent atrial fibrillation. This type often lasts longer than a week. It may stop on its own or with treatment.  Long-lasting persistent atrial fibrillation. This type lasts longer than 12 months.  Permanent atrial fibrillation. This type does not go away. Talk with your health care provider to learn about the type of atrial fibrillation that you have. CAUSES This condition is caused by some heart-related conditions or procedures, including:  A heart attack.  Coronary artery disease.  Heart failure.  Heart valve conditions.  High blood pressure.  Inflammation of the sac that surrounds the heart (pericarditis).  Heart surgery.  Certain heart rhythm disorders, such as Wolf-Parkinson-White syndrome. Other causes include:  Pneumonia.  Obstructive sleep apnea.  Blockage of an artery in the lungs (pulmonary embolism, or PE).  Lung cancer.  Chronic lung disease.  Thyroid problems, especially if the thyroid is overactive (hyperthyroidism).  Caffeine.  Excessive alcohol use or illegal drug use.  Use of some medicines, including certain decongestants and diet pills. Sometimes, the cause cannot be found. RISK FACTORS This condition is more likely to develop in:  People who are older in  age.  People who smoke.  People who have diabetes mellitus.  People who are overweight (obese).  Athletes who exercise vigorously. SYMPTOMS Symptoms of this condition include:  A feeling that your heart is beating rapidly or irregularly.  A feeling of discomfort or pain in your chest.  Shortness of breath.  Sudden light-headedness or weakness.  Getting tired easily during exercise. In some cases, there are no symptoms. DIAGNOSIS Your health care provider may be able to detect atrial fibrillation when taking your pulse. If detected, this condition may be diagnosed with:  An electrocardiogram (ECG).  A Holter monitor test that records your heartbeat patterns over a 24-hour period.  Transthoracic echocardiogram (TTE) to evaluate how blood flows through your heart.  Transesophageal echocardiogram (TEE) to view more detailed images of your heart.  A stress test.  Imaging tests, such as a CT scan or chest X-ray.  Blood tests. TREATMENT The main goals of treatment are to prevent blood clots from forming and to keep your heart beating at a normal rate and rhythm. The type of treatment that you receive depends on many factors, such as your underlying medical conditions and how you feel when you are experiencing atrial fibrillation. This condition may be treated with:  Medicine to slow down the heart rate, bring the heart's rhythm back to normal, or prevent clots from forming.  Electrical cardioversion. This is a procedure that resets your heart's rhythm by delivering a controlled, low-energy shock to the heart through your skin.  Different types of ablation, such as catheter ablation, catheter ablation with pacemaker, or surgical ablation. These procedures destroy the heart tissues that send abnormal signals. When the pacemaker is used, it is placed under your skin to help your heart  beat in a regular rhythm. HOME CARE INSTRUCTIONS  Take over-the counter and prescription  medicines only as told by your health care provider.  If your health care provider prescribed a blood-thinning medicine (anticoagulant), take it exactly as told. Taking too much blood-thinning medicine can cause bleeding. If you do not take enough blood-thinning medicine, you will not have the protection that you need against stroke and other problems.  Do not use tobacco products, including cigarettes, chewing tobacco, and e-cigarettes. If you need help quitting, ask your health care provider.  If you have obstructive sleep apnea, manage your condition as told by your health care provider.  Do not drink alcohol.  Do not drink beverages that contain caffeine, such as coffee, soda, and tea.  Maintain a healthy weight. Do not use diet pills unless your health care provider approves. Diet pills may make heart problems worse.  Follow diet instructions as told by your health care provider.  Exercise regularly as told by your health care provider.  Keep all follow-up visits as told by your health care provider. This is important. PREVENTION  Avoid drinking beverages that contain caffeine or alcohol.  Avoid certain medicines, especially medicines that are used for breathing problems.  Avoid certain herbs and herbal medicines, such as those that contain ephedra or ginseng.  Do not use illegal drugs, such as cocaine and amphetamines.  Do not smoke.  Manage your high blood pressure. SEEK MEDICAL CARE IF:  You notice a change in the rate, rhythm, or strength of your heartbeat.  You are taking an anticoagulant and you notice increased bruising.  You tire more easily when you exercise or exert yourself. SEEK IMMEDIATE MEDICAL CARE IF:  You have chest pain, abdominal pain, sweating, or weakness.  You feel nauseous.  You notice blood in your vomit, bowel movement, or urine.  You have shortness of breath.  You suddenly have swollen feet and ankles.  You feel dizzy.  You have  sudden weakness or numbness of the face, arm, or leg, especially on one side of the body.  You have trouble speaking, trouble understanding, or both (aphasia).  Your face or your eyelid droops on one side. These symptoms may represent a serious problem that is an emergency. Do not wait to see if the symptoms will go away. Get medical help right away. Call your local emergency services (911 in the [place] Do not drive yourself to the hospital.   This information is not intended to replace advice given to you by your health care provider. Make sure you discuss any questions you have with your health care provider.   Document Released: 01/20/2005 Document Revised: 10/11/2014 Document Reviewed: 05/17/2014 Elsevier Interactive Patient Education [employer].

## 2015-03-14 NOTE — ED Notes (Addendum)
Pt reports hx of atrial fib and ablation x 3. Reports irregular HR today that has been sustained. Having dizziness and mild sob, went to clinic and sent here due to rate of 160.

## 2015-03-14 NOTE — Progress Notes (Signed)
Patient ID: Suzanne [name], female   DOB: 05-Oct-1950, 65 y.o.   MRN: [id number]     Primary Care Physician: Gennette Pac, MD Referring Physician: Dr. Arlyn Dunning is a 65 y.o. female with a h/o afib ablation x2  that was in her USOH until this am  when felt her heart rate start racing and noted shortness of breath. She has had some brief episodes over the years but when it was sustained called and asked for an appointment wit the afib clinic. EKG shows a probable aflutter with a v rate of 160 bpm. She is tolerating well but has a fairly soft BP of 99991111 systolic. She has deferred blood thinners in the past for a chadsvasc score of 2. She is not on daily rate control meds.  Today, she denies symptoms of palpitations, chest pain, shortness of breath, orthopnea, PND, lower extremity edema, dizziness, presyncope, syncope, or neurologic sequela. The patient is tolerating medications without difficulties and is otherwise without complaint today.   Past Medical History  Diagnosis Date  . Atrial fibrillation ([place])     s/p ablations  . Atrial flutter ([place])   . H/O: rheumatic fever 1957  . COPD (chronic obstructive pulmonary disease) ([place])   . Depression   . Migraine   . Frozen shoulder     Left  . History of asthma   . Degenerative arthritis   . PONV (postoperative nausea and vomiting)     only with Fentanyl and Dilaudid   Past Surgical History  Procedure Laterality Date  . Knee surgery  [phone]  . Rhinoplasty  age 4  . Pvi  2/11   & 6/11  . Cti ablation  3/11  . Tonsillectomy  as child  . Total abdominal hysterectomy  1990    right ovary removed  . 2 teeth extraction  2009  . Esophagogastroduodenoscopy (egd) with propofol N/A 01/06/2013    Procedure: ESOPHAGOGASTRODUODENOSCOPY (EGD) WITH PROPOFOL;  Surgeon: Cleotis Nipper, MD;  Location: [facility];  Service: Endoscopy;  Laterality: N/A;    Current Outpatient Prescriptions  Medication Sig  Dispense Refill  . ALPRAZolam (XANAX) 0.5 MG tablet Take 1 tablet by mouth at bedtime as needed. Anxiety or sleep    . aspirin EC 81 MG tablet Take 81 mg by mouth daily.    Marland Kitchen aspirin-acetaminophen-caffeine (EXCEDRIN MIGRAINE) 250-250-65 MG per tablet Take 1 tablet by mouth every 6 (six) hours as needed for headache.    Marland Kitchen BOTOX 200 UNITS SOLR Take as directed by MD    . buPROPion (WELLBUTRIN XL) 300 MG 24 hr tablet Take 300 mg by mouth daily with breakfast.     . butalbital-acetaminophen-caffeine (FIORICET, ESGIC) 50-325-40 MG per tablet Take 1 tablet by mouth every 6 (six) hours as needed for headache or migraine. 90 tablet 3  . ibuprofen (ADVIL,MOTRIN) 200 MG tablet Take 200-400 mg by mouth every 6 (six) hours as needed (Pain).    . promethazine (PHENERGAN) 25 MG tablet Take 1 tablet (25 mg total) by mouth every 6 (six) hours as needed for nausea or vomiting. 15 tablet 2  . sertraline (ZOLOFT) 100 MG tablet Take 100 mg by mouth daily with breakfast.     . SUMAtriptan (IMITREX) 100 MG tablet TAKE 1 TABLET BY MOUTH AT THE ONSET OF A HEADACHE DAILY AS NEEDED 27 tablet 3   No current facility-administered medications for this encounter.    Allergies  Allergen Reactions  . Dilaudid [Hydromorphone Hcl] Nausea And  Vomiting  . Fentanyl Nausea And Vomiting  . Ciprofloxacin Rash  . Erythromycin Rash    Social History   Social History  . Marital Status: Married    Spouse Name: N/A  . Number of Children: 0  . Years of Education: 16   Occupational History  . 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 [place]   Social History Main Topics  . Smoking status: Current Every Day Smoker -- 0.50 packs/day for 40 years  . Smokeless tobacco: Never Used  . Alcohol Use: Yes     Comment: 1-2 glasses of wine per year  . Drug Use: No  . Sexual Activity: Not on file   Other Topics Concern  . Not on file   Social History Narrative   Patient is right handed.    Patient drinks one cup caffeine.    Family History  Problem Relation Age of Onset  . Cancer Mother     renal cell  . Migraines Mother   . Dementia Father   . Heart disease Father     ROS- All systems are reviewed and negative except as per the HPI above  Physical Exam: Filed Vitals:   03/14/15 1435  BP: 116/82  Pulse: 160  Height: 5\' 7"  (1.702 m)  Weight: 158 lb 9.6 oz (71.94 kg)    GEN- The patient is well appearing, alert and oriented x 3 today.   Head- normocephalic, atraumatic Eyes-  Sclera clear, conjunctiva pink Ears- hearing intact Oropharynx- clear Neck- supple, no JVP Lymph- no cervical lymphadenopathy Lungs- Clear to ausculation bilaterally, normal work of breathing Heart- Rapid regular rate and rhythm, no murmurs, rubs or gallops, PMI not laterally displaced GI- soft, NT, ND, + BS Extremities- no clubbing, cyanosis, or edema MS- no significant deformity or atrophy Skin- no rash or lesion Psych- euthymic mood, full affect Neuro- strength and sensation are intact  EKG- Aflutter with v rate of 160 bpm, RBBB Epic records reviewed  Assessment and Plan: 1. Paroxysmal a flutter To ER due to rapid v rate of 160 bpm, and not on blood thinners Have a short window to convert pt back to SR Discussed cardioversion/cardizem IV and she would prefer cardizem   Butch Penny C. Farheen Pfahler, [facility] [address] [place], [place] [address] [phone]

## 2015-03-15 MED FILL — ELIQUIS 5 MG TABLET: 5 | 30 days supply | Qty: 60 | Fill #0

## 2015-03-19 ENCOUNTER — Ambulatory Visit (HOSPITAL_COMMUNITY)
Admission: RE | Admit: 2015-03-19 | Discharge: 2015-03-19 | Disposition: A | Discharge status: Home / Self Care | Payer: Medicare Other | Source: Ambulatory Visit | Attending: Nurse Practitioner | Admitting: Nurse Practitioner

## 2015-03-19 VITALS — BP 124/60 | HR 69 | Ht 67.0 in | Wt 159.6 lb

## 2015-03-19 DIAGNOSIS — F329 Major depressive disorder, single episode, unspecified: Secondary | ICD-10-CM | POA: Insufficient documentation

## 2015-03-19 DIAGNOSIS — F17210 Nicotine dependence, cigarettes, uncomplicated: Secondary | ICD-10-CM | POA: Diagnosis not present

## 2015-03-19 DIAGNOSIS — Z885 Allergy status to narcotic agent status: Secondary | ICD-10-CM | POA: Diagnosis not present

## 2015-03-19 DIAGNOSIS — J449 Chronic obstructive pulmonary disease, unspecified: Secondary | ICD-10-CM | POA: Insufficient documentation

## 2015-03-19 DIAGNOSIS — Z7902 Long term (current) use of antithrombotics/antiplatelets: Secondary | ICD-10-CM | POA: Insufficient documentation

## 2015-03-19 DIAGNOSIS — M1990 Unspecified osteoarthritis, unspecified site: Secondary | ICD-10-CM | POA: Insufficient documentation

## 2015-03-19 DIAGNOSIS — I4892 Unspecified atrial flutter: Secondary | ICD-10-CM | POA: Diagnosis not present

## 2015-03-19 DIAGNOSIS — Z881 Allergy status to other antibiotic agents status: Secondary | ICD-10-CM | POA: Insufficient documentation

## 2015-03-19 DIAGNOSIS — Z8249 Family history of ischemic heart disease and other diseases of the circulatory system: Secondary | ICD-10-CM | POA: Insufficient documentation

## 2015-03-19 DIAGNOSIS — Z79899 Other long term (current) drug therapy: Secondary | ICD-10-CM | POA: Insufficient documentation

## 2015-03-19 DIAGNOSIS — J45909 Unspecified asthma, uncomplicated: Secondary | ICD-10-CM | POA: Insufficient documentation

## 2015-03-19 DIAGNOSIS — I4891 Unspecified atrial fibrillation: Secondary | ICD-10-CM | POA: Diagnosis present

## 2015-03-19 MED ORDER — DILTIAZEM HCL 30 MG PO TABS
ORAL_TABLET | ORAL | Status: DC
Start: 2015-03-19 — End: 2016-09-29

## 2015-03-19 MED FILL — dilTIAZem HCL 30 MG TABS: 30 | 7 days supply | Qty: 45 | Fill #0

## 2015-03-19 NOTE — Progress Notes (Addendum)
Patient ID: Suzanne [name], female   DOB: 08-18-1950, 65 y.o.   MRN: [id number]     Primary Care Physician: Gennette Pac, MD Referring Physician: Dr. Arlyn Dunning is a 65 y.o. female with a h/o afib ablation x 2, atrial flutter x 1,  that was in her USOH until 2/08,  when  her heart rate start racing and she  noted shortness of breath. She has had some brief episodes over the years but when it was sustained called and asked for an appointment wit the afib clinic. EKG shows a probable aflutter with a v rate of 160 bpm, pt felt that her heart rate was higher when it first started.Marland Kitchen She was tolerating fairly well but has a fairly soft BP of 99991111 systolic. She has deferred blood thinners in the past for a chadsvasc score of 2. She is not on daily rate control meds.She has 30 mg cardizem but was unsure to take it.  Since she was not on blood thinners, rate was rapid and sys BP was fairly soft at 116, the decision was made to send her to ER. Cardizem drip was started and cardioversion was being planned, when she converted with a lengthy termination pause, per husband 3-5 secs.   She returns to afib clinic, no further spells of irregular heart beat. We discussed using cardizem early on to drive down v rate. Am hesitate to start AAD due to termination pauses, which pt states has been an issue in the past.  Today, she denies symptoms of palpitations, chest pain, shortness of breath, orthopnea, PND, lower extremity edema, dizziness, presyncope, syncope, or neurologic sequela. The patient is tolerating medications without difficulties and is otherwise without complaint today.   Past Medical History  Diagnosis Date  . Atrial fibrillation ([place])     s/p ablations  . Atrial flutter ([place])   . H/O: rheumatic fever 1957  . COPD (chronic obstructive pulmonary disease) ([place])   . Depression   . Migraine   . Frozen shoulder     Left  . History of asthma   . Degenerative arthritis   . PONV  (postoperative nausea and vomiting)     only with Fentanyl and Dilaudid   Past Surgical History  Procedure Laterality Date  . Knee surgery  [phone]  . Rhinoplasty  age 46  . Pvi  2/11   & 6/11  . Cti ablation  3/11  . Tonsillectomy  as child  . Total abdominal hysterectomy  1990    right ovary removed  . 2 teeth extraction  2009  . Esophagogastroduodenoscopy (egd) with propofol N/A 01/06/2013    Procedure: ESOPHAGOGASTRODUODENOSCOPY (EGD) WITH PROPOFOL;  Surgeon: Cleotis Nipper, MD;  [facility];  Service: Endoscopy;  Laterality: N/A;    Current Outpatient Prescriptions  Medication Sig Dispense Refill  . ALPRAZolam (XANAX) 0.5 MG tablet Take 0.5 mg by mouth 2 (two) times daily as needed for anxiety or sleep. Anxiety or sleep    . apixaban (ELIQUIS) 5 MG TABS tablet Take 1 tablet (5 mg total) by mouth 2 (two) times daily. 60 tablet 0  . aspirin-acetaminophen-caffeine (EXCEDRIN MIGRAINE) [id number] MG per tablet Take 1 tablet by mouth every 6 (six) hours as needed for headache.    Marland Kitchen BOTOX 200 UNITS SOLR Take as directed by MD    . buPROPion (WELLBUTRIN XL) 300 MG 24 hr tablet Take 300 mg by mouth daily with breakfast.     . butalbital-acetaminophen-caffeine (FIORICET, ESGIC)  50-325-40 MG per tablet Take 1 tablet by mouth every 6 (six) hours as needed for headache or migraine. 90 tablet 3  . promethazine (PHENERGAN) 25 MG tablet Take 1 tablet (25 mg total) by mouth every 6 (six) hours as needed for nausea or vomiting. 15 tablet 2  . sertraline (ZOLOFT) 100 MG tablet Take 100 mg by mouth daily with breakfast.     . SUMAtriptan (IMITREX) 100 MG tablet TAKE 1 TABLET BY MOUTH AT THE ONSET OF A HEADACHE DAILY AS NEEDED 27 tablet 3  . diltiazem (CARDIZEM) 30 MG tablet Cardizem 30mg  -- take 1 tablet by mouth every 4 hours AS NEEDED for heart rate >100 as long as blood pressure >100. 45 tablet 1   No current facility-administered medications for this encounter.     Allergies  Allergen Reactions  . Dilaudid [Hydromorphone Hcl] Nausea And Vomiting  . Fentanyl Nausea And Vomiting  . Ciprofloxacin Rash  . Erythromycin Rash    Social History   Social History  . Marital Status: Married    Spouse Name: N/A  . Number of Children: 0  . Years of Education: 16   Occupational History  . 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 [place]   Social History Main Topics  . Smoking status: Current Every Day Smoker -- 0.50 packs/day for 40 years  . Smokeless tobacco: Never Used  . Alcohol Use: Yes     Comment: 1-2 glasses of wine per year  . Drug Use: No  . Sexual Activity: Not on file   Other Topics Concern  . Not on file   Social History Narrative   Patient is right handed.   Patient drinks one cup caffeine.    Family History  Problem Relation Age of Onset  . Cancer Mother     renal cell  . Migraines Mother   . Dementia Father   . Heart disease Father     ROS- All systems are reviewed and negative except as per the HPI above  Physical Exam: Filed Vitals:   03/19/15 0843  BP: 124/60  Pulse: 69  Height: 5\' 7"  (1.702 m)  Weight: 159 lb 9.6 oz (72.394 kg)    GEN- The patient is well appearing, alert and oriented x 3 today.   Head- normocephalic, atraumatic Eyes-  Sclera clear, conjunctiva pink Ears- hearing intact Oropharynx- clear Neck- supple, no JVP Lymph- no cervical lymphadenopathy Lungs- Clear to ausculation bilaterally, normal work of breathing Heart-  regular rate and rhythm, no murmurs, rubs or gallops, PMI not laterally displaced GI- soft, NT, ND, + BS Extremities- no clubbing, cyanosis, or edema MS- no significant deformity or atrophy Skin- no rash or lesion Psych- euthymic mood, full affect Neuro- strength and sensation are intact  EKG-SR with rate of 69 bpm, pr int 150 ms, qrs int 68 ms, qtc 409 ms Epic records reviewed  Assessment and Plan: 1. Paroxysmal a flutter, s/p  ablations Cardioverted in ER 2/8 with cardizem, with  termination pause  Discussed how to use prn Cardizem, new rx sent in for 30 mg one every 4 hours for v rate over 100, sys bp over 100. On apixaban 5 mg bid for 30 days post conversion and needs to stay on long term due to chadsvasc score of 2. Pt will consider.   Will schedule to see Dr. Rayann Heman for further evaluation  Geroge Baseman. Demetres Prochnow, [facility] [address]  [place], [place] [address] [phone]

## 2015-03-26 ENCOUNTER — Encounter: Payer: Self-pay | Admitting: Internal Medicine

## 2015-03-26 ENCOUNTER — Ambulatory Visit: Payer: Medicare Other | Admitting: Physical Medicine & Rehabilitation

## 2015-03-26 ENCOUNTER — Ambulatory Visit: Payer: Medicare Other

## 2015-03-26 ENCOUNTER — Ambulatory Visit (INDEPENDENT_AMBULATORY_CARE_PROVIDER_SITE_OTHER): Payer: Medicare Other | Admitting: Internal Medicine

## 2015-03-26 VITALS — BP 112/74 | HR 77 | Ht 67.0 in | Wt 158.6 lb

## 2015-03-26 DIAGNOSIS — I480 Paroxysmal atrial fibrillation: Secondary | ICD-10-CM | POA: Diagnosis not present

## 2015-03-26 NOTE — Patient Instructions (Signed)

## 2015-03-27 NOTE — Progress Notes (Signed)
Primary Care Physician: Gennette Pac, MD Primary Cardiologist: Dr. Daneen Schick   Suzanne [name] is a 64 y.o. female with here for f/u.  She recently had an episode of atrial flutter which degenerated into atrial fibrillation.  Her arrhythmia spontaneously terminated.   Today, she denies symptoms of chest pain, shortness of breath, orthopnea, PND, lower extremity edema, dizziness, presyncope, syncope, or neurologic sequela. The patient is tolerating medications without difficulties and is otherwise without complaint today.   Past Medical History  Diagnosis Date  . Atrial fibrillation ([place])     s/p ablations  . Atrial flutter ([place])   . H/O: rheumatic fever 1957  . COPD (chronic obstructive pulmonary disease) ([place])   . Depression   . Migraine   . Frozen shoulder     Left  . History of asthma   . Degenerative arthritis   . PONV (postoperative nausea and vomiting)     only with Fentanyl and Dilaudid   Past Surgical History  Procedure Laterality Date  . Knee surgery  [phone]  . Rhinoplasty  age 29  . Pvi  2/11   & 6/11  . Cti ablation  3/11  . Tonsillectomy  as child  . Total abdominal hysterectomy  1990    right ovary removed  . 2 teeth extraction  2009  . Esophagogastroduodenoscopy (egd) with propofol N/A 01/06/2013    Procedure: ESOPHAGOGASTRODUODENOSCOPY (EGD) WITH PROPOFOL;  Surgeon: Cleotis Nipper, MD;  [facility];  Service: Endoscopy;  Laterality: N/A;    Current Outpatient Prescriptions  Medication Sig Dispense Refill  . ALPRAZolam (XANAX) 0.5 MG tablet Take 0.5 mg by mouth 2 (two) times daily as needed for anxiety or sleep. Anxiety or sleep    . apixaban (ELIQUIS) 5 MG TABS tablet Take 1 tablet (5 mg total) by mouth 2 (two) times daily. 60 tablet 0  . BOTOX 200 UNITS SOLR Take as directed by MD    . buPROPion (WELLBUTRIN XL) 300 MG 24 hr tablet Take 300 mg by mouth daily with breakfast.     . butalbital-acetaminophen-caffeine  (FIORICET, ESGIC) 50-325-40 MG per tablet Take 1 tablet by mouth every 6 (six) hours as needed for headache or migraine. 90 tablet 3  . diltiazem (CARDIZEM) 30 MG tablet Cardizem 30mg  -- take 1 tablet by mouth every 4 hours AS NEEDED for heart rate >100 as long as blood pressure >100. 45 tablet 1  . promethazine (PHENERGAN) 25 MG tablet Take 1 tablet (25 mg total) by mouth every 6 (six) hours as needed for nausea or vomiting. 15 tablet 2  . sertraline (ZOLOFT) 100 MG tablet Take 100 mg by mouth daily with breakfast.     . SUMAtriptan (IMITREX) 100 MG tablet TAKE 1 TABLET BY MOUTH AT THE ONSET OF A HEADACHE DAILY AS NEEDED 27 tablet 3   No current facility-administered medications for this visit.    Allergies  Allergen Reactions  . Dilaudid [Hydromorphone Hcl] Nausea And Vomiting  . Fentanyl Nausea And Vomiting  . Ciprofloxacin Rash  . Erythromycin Rash    Social History   Social History  . Marital Status: Married    Spouse Name: N/A  . Number of Children: 0  . Years of Education: 16   Occupational History  . 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 [place]   Social History Main Topics  . Smoking status: Current Every Day Smoker -- 0.50 packs/day for 40 years  .  Smokeless tobacco: Never Used  . Alcohol Use: Yes     Comment: 1-2 glasses of wine per year  . Drug Use: No  . Sexual Activity: Not on file   Other Topics Concern  . Not on file   Social History Narrative   Patient is right handed.   Patient drinks one cup caffeine.    Family History  Problem Relation Age of Onset  . Cancer Mother     renal cell  . Migraines Mother   . Dementia Father   . Heart disease Father     ROS- All systems are reviewed and negative except as per the HPI above  Physical Exam: Filed Vitals:   03/26/15 0914  BP: 112/74  Pulse: 77  Height: 5\' 7"  (1.702 m)  Weight: 158 lb 9.6 oz (71.94 kg)    GEN- The patient is well appearing, alert and  oriented x 3 today.   Head- normocephalic, atraumatic Eyes-  Sclera clear, conjunctiva pink Ears- hearing intact Oropharynx- clear Neck- supple,  Lungs- Clear to ausculation bilaterally, normal work of breathing Heart- Regular rate and rhythm, no murmurs, rubs or gallops, PMI not laterally displaced GI- soft, NT, ND, + BS Extremities- no clubbing, cyanosis, or edema MS- no significant deformity or atrophy Skin- no rash or lesion Psych- euthymic mood, full affect Neuro- strength and sensation are intact  EKGs are reviewed   Assessment and Plan: 1.  Afib/ atrial flutter Recent recurrence discussed We discussed diltiazem, AAD therapy and repeat ablation Presently, she would like to continue prn diltiazem.  We also discussed possible ILR monitoring  Still defers NOAC for Chadsvasc of 2.  Return to see me in 3 months She will contact my office if problems arise in the interim.  Thompson Grayer MD, [facility] 03/27/2015 10:33 PM

## 2015-03-28 MED FILL — CLOBETASOL 0.05% OINTMENT: 0.05 | 10 days supply | Qty: 30 | Fill #0

## 2015-04-04 ENCOUNTER — Telehealth: Payer: Self-pay

## 2015-04-04 ENCOUNTER — Other Ambulatory Visit: Payer: Self-pay

## 2015-04-04 MED ORDER — APIXABAN 5 MG PO TABS: 5.0000 mg | ORAL_TABLET | Freq: Two times a day (BID) | ORAL | Status: DC

## 2015-04-04 NOTE — Telephone Encounter (Signed)
Prior auth for Eliquis 5 mg sent to [facility]. 

## 2015-04-05 ENCOUNTER — Telehealth: Payer: Self-pay

## 2015-04-05 NOTE — Telephone Encounter (Signed)
Eliquis 5 mg approved through 02/03/2016. PA [id number].

## 2015-04-11 MED FILL — BUPROPION HCL XL 300 MG TAB: 300 | 90 days supply | Qty: 90 | Fill #3

## 2015-04-11 MED FILL — ELIQUIS 5 MG TABLET: 5 | 30 days supply | Qty: 60 | Fill #0

## 2015-04-11 MED FILL — SERTRALINE HCL 100 MG TAB: 100 | 90 days supply | Qty: 90 | Fill #3

## 2015-04-30 ENCOUNTER — Other Ambulatory Visit: Payer: Self-pay | Admitting: Neurology

## 2015-04-30 MED FILL — SUMATRIPTAN SUCC 100 MG TAB: 100 | 90 days supply | Qty: 27 | Fill #1

## 2015-04-30 MED FILL — VIT D2 1.25 MG (50,000 UNIT: 1.25 MG | 56 days supply | Qty: 8 | Fill #0

## 2015-05-02 ENCOUNTER — Encounter: Payer: Self-pay | Admitting: *Deleted

## 2015-05-02 MED FILL — BUTALB-ACETAMIN-CAFF 50-325: 50-325-40 | 22 days supply | Qty: 90 | Fill #0

## 2015-05-13 MED FILL — ELIQUIS 5 MG TABLET: 5 | 30 days supply | Qty: 60 | Fill #1

## 2015-05-25 ENCOUNTER — Encounter: Payer: Self-pay | Admitting: Neurology

## 2015-05-25 ENCOUNTER — Ambulatory Visit (INDEPENDENT_AMBULATORY_CARE_PROVIDER_SITE_OTHER): Payer: Medicare Other | Admitting: Neurology

## 2015-05-25 VITALS — BP 110/66 | HR 64 | Ht 67.0 in | Wt 159.0 lb

## 2015-05-25 DIAGNOSIS — G43019 Migraine without aura, intractable, without status migrainosus: Secondary | ICD-10-CM

## 2015-05-25 NOTE — Progress Notes (Signed)
Reason for visit: Migraine headache  Suzanne [name] is an 65 y.o. female  History of present illness:  Suzanne [name] is a 65 year old right-handed white female with a history of migraine headache. The patient has recently had problems with atrial fibrillation and atrial flutter requiring anticoagulation, currently on Eliquis. The patient has been treated with medications that have helped the atrial fibrillation. The patient has had 3 cardiac ablation procedures done in 2011. The patient has come off of Botox because of the anticoagulation, she has had some worsening of headaches over the last several months. She is getting back to having about 15 headache days a month, she has come off of a five-day headache recently requiring multiple Imitrex treatments. The patient last had Botox on 12/25/2014. She indicates that the last treatment was associated with some weakness of the neck extensor muscles and with difficulty with swallowing. This lasted about 3 and half weeks, then cleared.  Past Medical History  Diagnosis Date  . Atrial fibrillation ([place])     s/p ablations  . Atrial flutter ([place])   . H/O: rheumatic fever 1957  . COPD (chronic obstructive pulmonary disease) ([place])   . Depression   . Migraine   . Frozen shoulder     Left  . History of asthma   . Degenerative arthritis   . PONV (postoperative nausea and vomiting)     only with Fentanyl and Dilaudid    Past Surgical History  Procedure Laterality Date  . Knee surgery  [phone]  . Rhinoplasty  age 80  . Pvi  2/11   & 6/11  . Cti ablation  3/11  . Tonsillectomy  as child  . Total abdominal hysterectomy  1990    right ovary removed  . 2 teeth extraction  2009  . Esophagogastroduodenoscopy (egd) with propofol N/A 01/06/2013    Procedure: ESOPHAGOGASTRODUODENOSCOPY (EGD) WITH PROPOFOL;  Surgeon: Cleotis Nipper, MD;  [facility]: [facility];  Service: Endoscopy;  Laterality: N/A;    Family History  Problem  Relation Age of Onset  . Cancer Mother     renal cell  . Migraines Mother   . Dementia Father   . Heart disease Father     Social history:  reports that she has been smoking.  She has never used smokeless tobacco. She reports that she drinks alcohol. She reports that she does not use illicit drugs.    Allergies  Allergen Reactions  . Dilaudid [Hydromorphone Hcl] Nausea And Vomiting  . Fentanyl Nausea And Vomiting  . Ciprofloxacin Rash  . Erythromycin Rash    Medications:  Prior to Admission medications   Medication Sig Start Date End Date Taking? Authorizing Provider  acetaminophen (TYLENOL) 325 MG tablet Take 650 mg by mouth every 6 (six) hours as needed.   Yes Historical Provider, MD  ALPRAZolam Duanne Moron) 0.5 MG tablet Take 0.5 mg by mouth 2 (two) times daily as needed for anxiety or sleep. Anxiety or sleep 12/21/13  Yes Historical Provider, MD  apixaban (ELIQUIS) 5 MG TABS tablet Take 1 tablet (5 mg total) by mouth 2 (two) times daily. 04/04/15  Yes Thompson Grayer, MD  BOTOX 200 UNITS SOLR Take as directed by MD 12/07/13  Yes Historical Provider, MD  buPROPion (WELLBUTRIN XL) 300 MG 24 hr tablet Take 300 mg by mouth daily with breakfast.    Yes Historical Provider, MD  butalbital-acetaminophen-caffeine (FIORICET, ESGIC) 50-325-40 MG tablet TAKE 1 TABLET BY MOUTH EVERY 6 HOURS AS NEEDED FOR HEADACHE OR MIGRAINE  05/01/15  Yes Kathrynn Ducking, MD  clobetasol ointment (TEMOVATE) 0.05 %  03/28/15  Yes Historical Provider, MD  diltiazem (CARDIZEM) 30 MG tablet Cardizem 30mg  -- take 1 tablet by mouth every 4 hours AS NEEDED for heart rate >100 as long as blood pressure >100. 03/19/15  Yes Sherran Needs, NP  promethazine (PHENERGAN) 25 MG tablet Take 1 tablet (25 mg total) by mouth every 6 (six) hours as needed for nausea or vomiting. 05/17/14  Yes Kathrynn Ducking, MD  sertraline (ZOLOFT) 100 MG tablet Take 100 mg by mouth daily with breakfast.    Yes Historical Provider, MD  SUMAtriptan  (IMITREX) 100 MG tablet TAKE 1 TABLET BY MOUTH AT THE ONSET OF A HEADACHE DAILY AS NEEDED 05/17/14  Yes Kathrynn Ducking, MD  Vitamin D, Ergocalciferol, (DRISDOL) 50000 units CAPS capsule  04/30/15  Yes Historical Provider, MD    ROS:  Out of a complete 14 system review of symptoms, the patient complains only of the following symptoms, and all other reviewed systems are negative.  Migraine headache  Blood pressure 110/66, pulse 64, height 5\' 7"  (1.702 m), weight 159 lb (72.122 kg).  Physical Exam  General: The patient is alert and cooperative at the time of the examination.  Skin: No significant peripheral edema is noted.   Neurologic Exam  Mental status: The patient is alert and oriented x 3 at the time of the examination. The patient has apparent normal recent and remote memory, with an apparently normal attention span and concentration ability.   Cranial nerves: Facial symmetry is present. Speech is normal, no aphasia or dysarthria is noted. Extraocular movements are full. Visual fields are full.  Motor: The patient has good strength in all 4 extremities.  Sensory examination: Soft touch sensation is symmetric on the face, arms, and legs.  Coordination: The patient has good finger-nose-finger and heel-to-shin bilaterally.  Gait and station: The patient has a normal gait. Tandem gait is minimally unsteady. Romberg is negative. No drift is seen.  Reflexes: Deep tendon reflexes are symmetric.   Assessment/Plan:  1. Intractable migraine headache  The patient wishes to have the Botox done through this office again. We will try to get this set up. The patient will follow-up in about 6 months. We will hopefully see her back sooner for the Botox. The headaches become severe again off of this treatment. The Botox was quite effective for her headaches, it reduced the frequency by at least 50%.  Jill Alexanders MD 05/25/2015 4:47 PM  [facility] [address] [place], [place] [address]  Phone [phone] Fax [phone]

## 2015-05-28 ENCOUNTER — Telehealth: Payer: Self-pay | Admitting: Neurology

## 2015-05-28 NOTE — Telephone Encounter (Signed)
Called patient to schedule botox injection per Dr. Jannifer Franklin request. Patient will have to be B/B but has requested to come back to our office. She did not answer but I left a VM asking her to return my call.

## 2015-06-08 MED FILL — ELIQUIS 5 MG TABLET: 5 | 30 days supply | Qty: 60 | Fill #2

## 2015-06-13 ENCOUNTER — Telehealth: Payer: Self-pay | Admitting: Neurology

## 2015-06-13 NOTE — Telephone Encounter (Signed)
Whitney with [facility] receivied PA for patient's Botox. They are needing additional information as to diagnois and chart notes.  They are faxing over a for this information.  Thanks!

## 2015-06-14 ENCOUNTER — Encounter: Payer: Self-pay | Admitting: Neurology

## 2015-06-14 ENCOUNTER — Ambulatory Visit (INDEPENDENT_AMBULATORY_CARE_PROVIDER_SITE_OTHER): Payer: Medicare Other | Admitting: Neurology

## 2015-06-14 VITALS — BP 118/70 | HR 72 | Ht 67.0 in | Wt 158.5 lb

## 2015-06-14 DIAGNOSIS — G43719 Chronic migraine without aura, intractable, without status migrainosus: Secondary | ICD-10-CM | POA: Diagnosis not present

## 2015-06-14 MED ORDER — ONABOTULINUMTOXINA 200 UNITS IJ SOLR
200.0000 [IU] | Freq: Once | INTRAMUSCULAR | Status: AC
  Administered 2015-06-14: 200 [IU] via INTRAMUSCULAR

## 2015-06-14 NOTE — Progress Notes (Signed)
Please refer to Botox procedure note. 

## 2015-06-14 NOTE — Procedures (Signed)
     BOTOX PROCEDURE NOTE FOR MIGRAINE HEADACHE   HISTORY: Suzanne [name] is a 65 year old patient with a history of intractable migraine headache. The patient has had improvement with frequency and severity of her headaches with Botox, she comes in for her injection today.   Description of procedure:  The patient was placed in a sitting position. The standard protocol was used for Botox as follows, with 5 units of Botox injected at each site:   -Procerus muscle, midline injection  -Corrugator muscle, bilateral injection  -Frontalis muscle, bilateral injection, with 2 sites each side, medial injection was performed in the upper one third of the frontalis muscle, in the region vertical from the medial inferior edge of the superior orbital rim. The lateral injection was again in the upper one third of the forehead vertically above the lateral limbus of the cornea, 1.5 cm lateral to the medial injection site.  -Temporalis muscle injection, 4 sites, bilaterally. The first injection was 3 cm above the tragus of the ear, second injection site was 1.5 cm to 3 cm up from the first injection site in line with the tragus of the ear. The third injection site was 1.5-3 cm forward between the first 2 injection sites. The fourth injection site was 1.5 cm posterior to the second injection site.  -Occipitalis muscle injection, 3 sites, bilaterally. The first injection was done one half way between the occipital protuberance and the tip of the mastoid process behind the ear. The second injection site was done lateral and superior to the first, 1 fingerbreadth from the first injection. The third injection site was 1 fingerbreadth superiorly and medially from the first injection site.  -Cervical paraspinal muscle injection, 2 sites, bilateral, the first injection site was 1 cm from the midline of the cervical spine, 3 cm inferior to the lower border of the occipital protuberance. The second injection site was  1.5 cm superiorly and laterally to the first injection site.  -Trapezius muscle injection was performed at 3 sites, bilaterally. The first injection site was in the upper trapezius muscle halfway between the inflection point of the neck, and the acromion. The second injection site was one half way between the acromion and the first injection site. The third injection was done between the first injection site and the inflection point of the neck.   A 200 unit bottle of Botox was used, 155 units were injected, the rest of the Botox was wasted. The patient tolerated the procedure well, there were no complications of the above procedure.  Botox NDC [id number] Lot number [id number] Expiration date April 2019

## 2015-06-18 ENCOUNTER — Telehealth: Payer: Self-pay | Admitting: Neurology

## 2015-06-18 NOTE — Telephone Encounter (Signed)
Pt needs to speak with Suzanne [name] about botox per answering service  Onell Kravitz [id number]  [facility]  [address]  NEEDS TO SPEAK W/ DANIELLE-ABOUT BOTOX  [address] [place]  N

## 2015-06-19 NOTE — Telephone Encounter (Signed)
[place]/[employer] appeals [phone], please call. She will also fax questions to [phone] (phone POD)

## 2015-06-19 NOTE — Telephone Encounter (Signed)
Called patient and spoke with her regarding her PA for botox.

## 2015-06-25 ENCOUNTER — Encounter: Payer: Self-pay | Admitting: Internal Medicine

## 2015-06-25 ENCOUNTER — Ambulatory Visit (INDEPENDENT_AMBULATORY_CARE_PROVIDER_SITE_OTHER): Payer: Medicare Other | Admitting: Internal Medicine

## 2015-06-25 VITALS — BP 122/84 | HR 69 | Ht 67.0 in | Wt 158.4 lb

## 2015-06-25 DIAGNOSIS — I480 Paroxysmal atrial fibrillation: Secondary | ICD-10-CM | POA: Diagnosis not present

## 2015-06-25 MED ORDER — APIXABAN 5 MG PO TABS: 5.0000 mg | ORAL_TABLET | Freq: Two times a day (BID) | ORAL | Status: DC

## 2015-06-25 NOTE — Progress Notes (Signed)
Primary Care Physician: Gennette Pac, MD Primary Cardiologist: Dr. Daneen Schick   Suzanne [name] is a 65 y.o. female with here for f/u.   Doing well.  5 episodes of af (longest 6 hours) over the past 3 months.  Doing well with pill in pocket diltiazem.  Planning a trip to [place] soon. Today, she denies symptoms of chest pain, shortness of breath, orthopnea, PND, lower extremity edema, dizziness, presyncope, syncope, or neurologic sequela. The patient is tolerating medications without difficulties and is otherwise without complaint today.   Past Medical History  Diagnosis Date  . Atrial fibrillation ([place])     s/p ablations  . Atrial flutter ([place])   . H/O: rheumatic fever 1957  . COPD (chronic obstructive pulmonary disease) ([place])   . Depression   . Migraine   . Frozen shoulder     Left  . History of asthma   . Degenerative arthritis   . PONV (postoperative nausea and vomiting)     only with Fentanyl and Dilaudid   Past Surgical History  Procedure Laterality Date  . Knee surgery  [phone]  . Rhinoplasty  age 9  . Pvi  2/11   & 6/11  . Cti ablation  3/11  . Tonsillectomy  as child  . Total abdominal hysterectomy  1990    right ovary removed  . 2 teeth extraction  2009  . Esophagogastroduodenoscopy (egd) with propofol N/A 01/06/2013    Procedure: ESOPHAGOGASTRODUODENOSCOPY (EGD) WITH PROPOFOL;  Surgeon: Cleotis Nipper, MD;  Location: [facility];  Service: Endoscopy;  Laterality: N/A;    Current Outpatient Prescriptions  Medication Sig Dispense Refill  . acetaminophen (TYLENOL) 325 MG tablet Take 650 mg by mouth every 6 (six) hours as needed.    . ALPRAZolam (XANAX) 0.5 MG tablet Take 0.5 mg by mouth 2 (two) times daily as needed for anxiety or sleep. Anxiety or sleep    . apixaban (ELIQUIS) 5 MG TABS tablet Take 1 tablet (5 mg total) by mouth 2 (two) times daily. 60 tablet 11  . BOTOX 200 UNITS SOLR Take as directed by MD    . buPROPion  (WELLBUTRIN XL) 300 MG 24 hr tablet Take 300 mg by mouth daily with breakfast.     . butalbital-acetaminophen-caffeine (FIORICET, ESGIC) 50-325-40 MG tablet TAKE 1 TABLET BY MOUTH EVERY 6 HOURS AS NEEDED FOR HEADACHE OR MIGRAINE 90 tablet 3  . clobetasol ointment (TEMOVATE) AB-123456789 % Apply 1 application topically daily as needed (skin).   2  . diltiazem (CARDIZEM) 30 MG tablet Cardizem 30mg  -- take 1 tablet by mouth every 4 hours AS NEEDED for heart rate >100 as long as blood pressure >100. 45 tablet 1  . promethazine (PHENERGAN) 25 MG tablet Take 1 tablet (25 mg total) by mouth every 6 (six) hours as needed for nausea or vomiting. 15 tablet 2  . sertraline (ZOLOFT) 100 MG tablet Take 100 mg by mouth daily with breakfast.     . SUMAtriptan (IMITREX) 100 MG tablet TAKE 1 TABLET BY MOUTH AT THE ONSET OF A HEADACHE DAILY AS NEEDED 27 tablet 3   No current facility-administered medications for this visit.    Allergies  Allergen Reactions  . Dilaudid [Hydromorphone Hcl] Nausea And Vomiting  . Fentanyl Nausea And Vomiting  . Ciprofloxacin Rash  . Erythromycin Rash    Social History   Social History  . Marital Status: Married    Spouse Name: N/A  . Number of Children: 0  .  Years of Education: 16   Occupational History  . 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 [place]   Social History Main Topics  . Smoking status: Current Every Day Smoker -- 0.50 packs/day for 40 years  . Smokeless tobacco: Never Used  . Alcohol Use: Yes     Comment: 1-2 glasses of wine per year  . Drug Use: No  . Sexual Activity: Not on file   Other Topics Concern  . Not on file   Social History Narrative   Lives at home w/ husband and 2 dogs   Patient is right handed.   Patient drinks one cup caffeine.    Family History  Problem Relation Age of Onset  . Cancer Mother     renal cell  . Migraines Mother   . Dementia Father   . Heart disease Father     ROS- All  systems are reviewed and negative except as per the HPI above  Physical Exam: Filed Vitals:   06/25/15 1411  BP: 122/84  Pulse: 69  Height: 5\' 7"  (1.702 m)  Weight: 158 lb 6.4 oz (71.85 kg)    GEN- The patient is well appearing, alert and oriented x 3 today.   Head- normocephalic, atraumatic Eyes-  Sclera clear, conjunctiva pink Ears- hearing intact Oropharynx- clear Neck- supple,  Lungs- Clear to ausculation bilaterally, normal work of breathing Heart- Regular rate and rhythm, no murmurs, rubs or gallops, PMI not laterally displaced GI- soft, NT, ND, + BS Extremities- no clubbing, cyanosis, or edema MS- no significant deformity or atrophy Skin- no rash or lesion Psych- euthymic mood, full affect Neuro- strength and sensation are intact  EKGs are reviewed   Assessment and Plan: 1.  Afib/ atrial flutter Doing well with pill in pocket diltiazem We discussed diltiazem, AAD therapy and repeat ablation Presently, she would like to continue prn diltiazem.  We also discussed possible ILR monitoring.  She does not wish for this currently.  On NOAC for Chadsvasc of 2.  Doing well with eliquis  Return to see me in 3 months She will contact my office if problems arise in the interim.  Thompson Grayer MD, [facility] 06/25/2015 2:29 PM

## 2015-06-25 NOTE — Patient Instructions (Signed)

## 2015-06-25 NOTE — Addendum Note (Signed)
Addended by: Janan Halter F on: 06/25/2015 02:35 PM   Modules accepted: Orders

## 2015-07-11 MED FILL — ELIQUIS 5 MG TABLET: 5 | 90 days supply | Qty: 180 | Fill #0

## 2015-07-18 MED FILL — ALPRAZolam 0.5 MG TABS: 0.5 | 90 days supply | Qty: 270 | Fill #1

## 2015-07-18 MED FILL — BUTALB-ACETAMIN-CAFF 50-325: 50-325-40 | 22 days supply | Qty: 90 | Fill #1

## 2015-07-19 MED FILL — BUPROPION HCL XL 300 MG TAB: 300 | 90 days supply | Qty: 90 | Fill #0

## 2015-07-20 MED FILL — SERTRALINE HCL 100 MG TAB: 100 | 90 days supply | Qty: 90 | Fill #0

## 2015-09-18 ENCOUNTER — Encounter: Payer: Self-pay | Admitting: Internal Medicine

## 2015-09-18 ENCOUNTER — Telehealth: Payer: Self-pay | Admitting: Neurology

## 2015-09-18 NOTE — Telephone Encounter (Signed)
Patient called to inquire if BOTOX has been ordered for upcoming appointment Monday August 21st.

## 2015-09-24 ENCOUNTER — Encounter: Payer: Self-pay | Admitting: Neurology

## 2015-09-24 ENCOUNTER — Ambulatory Visit (INDEPENDENT_AMBULATORY_CARE_PROVIDER_SITE_OTHER): Payer: Medicare Other | Admitting: Neurology

## 2015-09-24 VITALS — BP 122/68 | HR 68 | Ht 67.0 in | Wt 158.0 lb

## 2015-09-24 DIAGNOSIS — G43719 Chronic migraine without aura, intractable, without status migrainosus: Secondary | ICD-10-CM

## 2015-09-24 NOTE — Progress Notes (Signed)
**  Botox 200 units x 1 vial, Lot [id number], exp 04/2018, [place] [id number], specialty pharmacy.//mck,rn.**

## 2015-09-24 NOTE — Procedures (Signed)
     BOTOX PROCEDURE NOTE FOR MIGRAINE HEADACHE   HISTORY: Suzanne [name] is a 65 year old patient with intractable migraine headache. The patient does much better with Botox, she still has 3 or 4 headaches a week, but the headaches are very minor, Tylenol help the headache. Since last seen she had 4 days in July and otherwise another 3 days of severe headaches where she had to take Imitrex. The patient had associated nausea with these headaches. The use of Botox has dramatically improved her migraine control. The headaches tend to get more pronounced within 2 weeks before the next scheduled Botox injection.   Description of procedure:  The patient was placed in a sitting position. The standard protocol was used for Botox as follows, with 5 units of Botox injected at each site:   -Procerus muscle, midline injection  -Corrugator muscle, bilateral injection  -Frontalis muscle, bilateral injection, with 2 sites each side, medial injection was performed in the upper one third of the frontalis muscle, in the region vertical from the medial inferior edge of the superior orbital rim. The lateral injection was again in the upper one third of the forehead vertically above the lateral limbus of the cornea, 1.5 cm lateral to the medial injection site.  -Temporalis muscle injection, 4 sites, bilaterally. The first injection was 3 cm above the tragus of the ear, second injection site was 1.5 cm to 3 cm up from the first injection site in line with the tragus of the ear. The third injection site was 1.5-3 cm forward between the first 2 injection sites. The fourth injection site was 1.5 cm posterior to the second injection site.  -Occipitalis muscle injection, 3 sites, bilaterally. The first injection was done one half way between the occipital protuberance and the tip of the mastoid process behind the ear. The second injection site was done lateral and superior to the first, 1 fingerbreadth from the first  injection. The third injection site was 1 fingerbreadth superiorly and medially from the first injection site.  -Cervical paraspinal muscle injection, 2 sites, bilateral, the first injection site was 1 cm from the midline of the cervical spine, 3 cm inferior to the lower border of the occipital protuberance. The second injection site was 1.5 cm superiorly and laterally to the first injection site.  -Trapezius muscle injection was performed at 3 sites, bilaterally. The first injection site was in the upper trapezius muscle halfway between the inflection point of the neck, and the acromion. The second injection site was one half way between the acromion and the first injection site. The third injection was done between the first injection site and the inflection point of the neck.   A 200 unit bottle of Botox was used, 155 units were injected, the rest of the Botox was wasted. The patient tolerated the procedure well, there were no complications of the above procedure.  Botox NDC [id number] Lot number [id number] Expiration date March 2020

## 2015-09-25 NOTE — Telephone Encounter (Signed)
Medication was delivered

## 2015-10-01 ENCOUNTER — Encounter: Payer: Self-pay | Admitting: Internal Medicine

## 2015-10-01 ENCOUNTER — Ambulatory Visit (INDEPENDENT_AMBULATORY_CARE_PROVIDER_SITE_OTHER): Payer: Medicare Other | Admitting: Internal Medicine

## 2015-10-01 VITALS — BP 126/78 | HR 61 | Ht 67.0 in | Wt 158.7 lb

## 2015-10-01 DIAGNOSIS — I4892 Unspecified atrial flutter: Secondary | ICD-10-CM | POA: Diagnosis not present

## 2015-10-01 DIAGNOSIS — I480 Paroxysmal atrial fibrillation: Secondary | ICD-10-CM | POA: Diagnosis not present

## 2015-10-01 NOTE — Progress Notes (Signed)
Primary Care Physician: Gennette Pac, MD Primary Cardiologist: Dr. Daneen Schick   Suzanne [name] is a 65 y.o. female with here for f/u.   Doing well. Only 3 short episodes of AF since her last visit which terminated with prn diltiazem.  Planning a trip to [place] for next month.  Her husband is retiring in January.  She is otherwise doing well and wishes to make no changes today.  Today, she denies symptoms of chest pain, shortness of breath, orthopnea, PND, lower extremity edema, dizziness, presyncope, syncope, or neurologic sequela. The patient is tolerating medications without difficulties and is otherwise without complaint today.   Past Medical History:  Diagnosis Date  . Atrial fibrillation ([place])    s/p ablations  . Atrial flutter ([place])   . COPD (chronic obstructive pulmonary disease) ([place])   . Degenerative arthritis   . Depression   . Frozen shoulder    Left  . H/O: rheumatic fever 1957  . History of asthma   . Migraine   . PONV (postoperative nausea and vomiting)    only with Fentanyl and Dilaudid   Past Surgical History:  Procedure Laterality Date  . 2 teeth extraction  2009  . CTI ablation  3/11  . ESOPHAGOGASTRODUODENOSCOPY (EGD) WITH PROPOFOL N/A 01/06/2013   Procedure: ESOPHAGOGASTRODUODENOSCOPY (EGD) WITH PROPOFOL;  Surgeon: Cleotis Nipper, MD;  Location: [facility];  Service: Endoscopy;  Laterality: N/A;  . KNEE SURGERY  [phone]  . pvi  2/11   & 6/11  . RHINOPLASTY  age 48  . TONSILLECTOMY  as child  . TOTAL ABDOMINAL HYSTERECTOMY  1990   right ovary removed    Current Outpatient Prescriptions  Medication Sig Dispense Refill  . acetaminophen (TYLENOL) 325 MG tablet Take 650 mg by mouth every 6 (six) hours as needed.    . ALPRAZolam (XANAX) 0.5 MG tablet Take 0.5 mg by mouth 2 (two) times daily as needed for anxiety or sleep. Anxiety or sleep    . apixaban (ELIQUIS) 5 MG TABS tablet Take 1 tablet (5 mg total) by mouth 2 (two) times  daily. 180 tablet 3  . BOTOX 200 UNITS SOLR Take as directed by MD    . buPROPion (WELLBUTRIN XL) 300 MG 24 hr tablet Take 300 mg by mouth daily with breakfast.     . butalbital-acetaminophen-caffeine (FIORICET, ESGIC) 50-325-40 MG tablet TAKE 1 TABLET BY MOUTH EVERY 6 HOURS AS NEEDED FOR HEADACHE OR MIGRAINE 90 tablet 3  . clobetasol ointment (TEMOVATE) AB-123456789 % Apply 1 application topically daily as needed (skin).   2  . diltiazem (CARDIZEM) 30 MG tablet Cardizem 30mg  -- take 1 tablet by mouth every 4 hours AS NEEDED for heart rate >100 as long as blood pressure >100. 45 tablet 1  . promethazine (PHENERGAN) 25 MG tablet Take 1 tablet (25 mg total) by mouth every 6 (six) hours as needed for nausea or vomiting. 15 tablet 2  . sertraline (ZOLOFT) 100 MG tablet Take 100 mg by mouth daily with breakfast.     . SUMAtriptan (IMITREX) 100 MG tablet TAKE 1 TABLET BY MOUTH AT THE ONSET OF A HEADACHE DAILY AS NEEDED 27 tablet 3   No current facility-administered medications for this visit.     Allergies  Allergen Reactions  . Dilaudid [Hydromorphone Hcl] Nausea And Vomiting  . Fentanyl Nausea And Vomiting  . Ciprofloxacin Rash  . Erythromycin Rash    Social History   Social History  . Marital status: Married  Spouse name: N/A  . Number of children: 0  . Years of education: 7   Occupational History  . 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 [employer]   Social History Main Topics  . Smoking status: Current Every Day Smoker    Packs/day: 0.50    Years: 40.00  . Smokeless tobacco: Never Used  . Alcohol use Yes     Comment: 1-2 glasses of wine per year  . Drug use: No  . Sexual activity: Not on file   Other Topics Concern  . Not on file   Social History Narrative   Lives at home w/ husband and 2 dogs   Patient is right handed.   Patient drinks one cup caffeine.    Family History  Problem Relation Age of Onset  . Cancer Mother     renal cell    . Migraines Mother   . Dementia Father   . Heart disease Father     ROS- All systems are reviewed and negative except as per the HPI above  Physical Exam: Vitals:   10/01/15 1008  BP: 126/78  Pulse: 61  Weight: 158 lb 11.2 oz (72 kg)  Height: 5\' 7"  (1.702 m)    GEN- The patient is well appearing, alert and oriented x 3 today.   Head- normocephalic, atraumatic Eyes-  Sclera clear, conjunctiva pink Ears- hearing intact Oropharynx- clear Neck- supple,  Lungs- Clear to ausculation bilaterally, normal work of breathing Heart- Regular rate and rhythm, no murmurs, rubs or gallops, PMI not laterally displaced GI- soft, NT, ND, + BS Extremities- no clubbing, cyanosis, or edema MS- no significant deformity or atrophy Skin- no rash or lesion Psych- euthymic mood, full affect Neuro- strength and sensation are intact  EKG today reveals sinus rhythm   Assessment and Plan: 1.  Afib/ atrial flutter Doing well with pill in pocket diltiazem.  She wishes to make no changes at this time.  On NOAC for Chadsvasc of 2.  Doing well with eliquis  Return to see me in 6 months She will contact my office if problems arise in the interim.  Thompson Grayer MD, [facility] 10/01/2015 10:28 AM

## 2015-10-01 NOTE — Patient Instructions (Signed)

## 2015-10-09 ENCOUNTER — Other Ambulatory Visit: Payer: Self-pay | Admitting: Neurology

## 2015-10-09 MED FILL — SERTRALINE HCL 100 MG TAB: 100 | 90 days supply | Qty: 90 | Fill #1

## 2015-10-09 MED FILL — BUTALB-ACETAMIN-CAFF 50-325: 50-325-40 | 22 days supply | Qty: 90 | Fill #2

## 2015-10-09 MED FILL — BUPROPION HCL XL 300 MG TAB: 300 | 90 days supply | Qty: 90 | Fill #0

## 2015-10-09 MED FILL — ELIQUIS 5 MG TABLET: 5 | 90 days supply | Qty: 180 | Fill #1

## 2015-10-10 MED FILL — SUMATRIPTAN SUCC 100 MG TAB: 100 | 90 days supply | Qty: 27 | Fill #0

## 2015-11-26 ENCOUNTER — Ambulatory Visit (INDEPENDENT_AMBULATORY_CARE_PROVIDER_SITE_OTHER): Payer: Medicare Other | Admitting: Neurology

## 2015-11-26 ENCOUNTER — Encounter: Payer: Self-pay | Admitting: Neurology

## 2015-11-26 VITALS — BP 129/68 | HR 63 | Ht 67.0 in | Wt 155.5 lb

## 2015-11-26 DIAGNOSIS — G43019 Migraine without aura, intractable, without status migrainosus: Secondary | ICD-10-CM | POA: Diagnosis not present

## 2015-11-26 NOTE — Progress Notes (Signed)
Reason for visit: Migraine headache  Suzanne [name] is an 65 y.o. female  History of present illness:  Suzanne [name] is a 65 year old right-handed white female with a history of migraine headache. The patient has done quite well with her headaches, she is on Botox and she indicates that the Botox has significantly reduced the severity of her headaches. She still has 2 or 3 headaches a week, but many of her headaches may respond to simple Tylenol dosing, she occasionally will take Fioricet, and she has not required any emergency room visits or treatment for migraine. The patient has had four severe headaches since last seen. She recently has been diagnosed with glaucoma. The patient indicates that when she takes her Imitrex it may result in some bruising. She returns for an evaluation.  Past Medical History:  Diagnosis Date  . Atrial fibrillation ([place])    s/p ablations  . Atrial flutter ([place])   . COPD (chronic obstructive pulmonary disease) ([place])   . Degenerative arthritis   . Depression   . Frozen shoulder    Left  . Glaucoma   . H/O: rheumatic fever 1957  . History of asthma   . Migraine   . PONV (postoperative nausea and vomiting)    only with Fentanyl and Dilaudid    Past Surgical History:  Procedure Laterality Date  . 2 teeth extraction  2009  . CTI ablation  3/11  . ESOPHAGOGASTRODUODENOSCOPY (EGD) WITH PROPOFOL N/A 01/06/2013   Procedure: ESOPHAGOGASTRODUODENOSCOPY (EGD) WITH PROPOFOL;  Surgeon: Cleotis Nipper, MD;  Location: [facility];  Service: Endoscopy;  Laterality: N/A;  . KNEE SURGERY  [phone]  . pvi  2/11   & 6/11  . RHINOPLASTY  age 23  . TONSILLECTOMY  as child  . TOTAL ABDOMINAL HYSTERECTOMY  1990   right ovary removed    Family History  Problem Relation Age of Onset  . Cancer Mother     renal cell  . Migraines Mother   . Dementia Father   . Heart disease Father     Social history:  reports that she has been smoking.  She has a  20.00 pack-year smoking history. She has never used smokeless tobacco. She reports that she drinks alcohol. She reports that she does not use drugs.    Allergies  Allergen Reactions  . Dilaudid [Hydromorphone Hcl] Nausea And Vomiting  . Fentanyl Nausea And Vomiting  . Ciprofloxacin Rash  . Erythromycin Rash    Medications:  Prior to Admission medications   Medication Sig Start Date End Date Taking? Authorizing Provider  acetaminophen (TYLENOL) 325 MG tablet Take 650 mg by mouth every 6 (six) hours as needed.   Yes Historical Provider, MD  ALPRAZolam Duanne Moron) 0.5 MG tablet Take 0.5 mg by mouth 2 (two) times daily as needed for anxiety or sleep. Anxiety or sleep 12/21/13  Yes Historical Provider, MD  apixaban (ELIQUIS) 5 MG TABS tablet Take 1 tablet (5 mg total) by mouth 2 (two) times daily. 06/25/15  Yes Thompson Grayer, MD  bimatoprost (LUMIGAN) 0.03 % ophthalmic solution 1 drop at bedtime.   Yes Historical Provider, MD  BOTOX 200 UNITS SOLR Take as directed by MD 12/07/13  Yes Historical Provider, MD  buPROPion (WELLBUTRIN XL) 300 MG 24 hr tablet Take 300 mg by mouth daily with breakfast.    Yes Historical Provider, MD  butalbital-acetaminophen-caffeine (FIORICET, ESGIC) 50-325-40 MG tablet TAKE 1 TABLET BY MOUTH EVERY 6 HOURS AS NEEDED FOR HEADACHE OR MIGRAINE 05/01/15  Yes Kathrynn Ducking, MD  clobetasol ointment (TEMOVATE) AB-123456789 % Apply 1 application topically daily as needed (skin).  03/28/15  Yes Historical Provider, MD  diltiazem (CARDIZEM) 30 MG tablet Cardizem 30mg  -- take 1 tablet by mouth every 4 hours AS NEEDED for heart rate >100 as long as blood pressure >100. 03/19/15  Yes Sherran Needs, NP  promethazine (PHENERGAN) 25 MG tablet Take 1 tablet (25 mg total) by mouth every 6 (six) hours as needed for nausea or vomiting. 05/17/14  Yes Kathrynn Ducking, MD  sertraline (ZOLOFT) 100 MG tablet Take 100 mg by mouth daily with breakfast.    Yes Historical Provider, MD  SUMAtriptan (IMITREX)  100 MG tablet TAKE 1 TABLET BY MOUTH AT THE ONSET OF A HEADACHE DAILY AS NEEDED 10/09/15  Yes Kathrynn Ducking, MD    ROS:  Out of a complete 14 system review of symptoms, the patient complains only of the following symptoms, and all other reviewed systems are negative.  Headache  Blood pressure 129/68, pulse 63, height 5\' 7"  (1.702 m), weight 155 lb 8 oz (70.5 kg).  Physical Exam  General: The patient is alert and cooperative at the time of the examination.  Skin: No significant peripheral edema is noted.   Neurologic Exam  Mental status: The patient is alert and oriented x 3 at the time of the examination. The patient has apparent normal recent and remote memory, with an apparently normal attention span and concentration ability.   Cranial nerves: Facial symmetry is present. Speech is normal, no aphasia or dysarthria is noted. Extraocular movements are full. Visual fields are full.  Motor: The patient has good strength in all 4 extremities.  Sensory examination: Soft touch sensation is symmetric on the face, arms, and legs.  Coordination: The patient has good finger-nose-finger and heel-to-shin bilaterally.  Gait and station: The patient has a normal gait. Tandem gait is normal. Romberg is negative. No drift is seen.  Reflexes: Deep tendon reflexes are symmetric.   Assessment/Plan:  1. Migraine headache  The patient is doing well with Botox, she has had a significant reduction in the severity of her headaches, the headaches are now easily controlled with over-the-counter medications. She has gained at least a 50% reduction in the number of headache she has, but she still has 2 or 3 headaches a week. The patient will continue the Botox therapy as this obviously has resulted in a significant benefit with her headache control. She will follow-up in 6 months.  Suzanne Alexanders MD 11/26/2015 8:12 AM  [facility] [address] [place], [place]  [address]  Phone [phone] Fax [phone]

## 2015-12-17 ENCOUNTER — Ambulatory Visit (INDEPENDENT_AMBULATORY_CARE_PROVIDER_SITE_OTHER): Payer: Medicare Other | Admitting: Neurology

## 2015-12-17 ENCOUNTER — Encounter: Payer: Self-pay | Admitting: Neurology

## 2015-12-17 VITALS — BP 116/72 | HR 63 | Ht 67.0 in | Wt 154.5 lb

## 2015-12-17 DIAGNOSIS — G43719 Chronic migraine without aura, intractable, without status migrainosus: Secondary | ICD-10-CM | POA: Diagnosis not present

## 2015-12-17 NOTE — Progress Notes (Signed)
Botox 200 units/vial x 1  (Specialty Pharmacy) Lot No [id number] Exp: AUG 2018

## 2015-12-17 NOTE — Procedures (Signed)
     BOTOX PROCEDURE NOTE FOR MIGRAINE HEADACHE   HISTORY: Suzanne [name] is a 65 year old patient with a history of intractable migraine headache. The patient clearly has gotten benefit with the Botox, she has had recurrence of the headache within the last 2 weeks, her headaches have gotten quite severe. After the injections, she does very well with the migraine for 2-1/2 months. She returns for another Botox injection therapy.   Description of procedure:  The patient was placed in a sitting position. The standard protocol was used for Botox as follows, with 5 units of Botox injected at each site:   -Procerus muscle, midline injection  -Corrugator muscle, bilateral injection  -Frontalis muscle, bilateral injection, with 2 sites each side, medial injection was performed in the upper one third of the frontalis muscle, in the region vertical from the medial inferior edge of the superior orbital rim. The lateral injection was again in the upper one third of the forehead vertically above the lateral limbus of the cornea, 1.5 cm lateral to the medial injection site.  -Temporalis muscle injection, 4 sites, bilaterally. The first injection was 3 cm above the tragus of the ear, second injection site was 1.5 cm to 3 cm up from the first injection site in line with the tragus of the ear. The third injection site was 1.5-3 cm forward between the first 2 injection sites. The fourth injection site was 1.5 cm posterior to the second injection site.  -Occipitalis muscle injection, 3 sites, bilaterally. The first injection was done one half way between the occipital protuberance and the tip of the mastoid process behind the ear. The second injection site was done lateral and superior to the first, 1 fingerbreadth from the first injection. The third injection site was 1 fingerbreadth superiorly and medially from the first injection site.  -Cervical paraspinal muscle injection, 2 sites, bilateral, the first  injection site was 1 cm from the midline of the cervical spine, 3 cm inferior to the lower border of the occipital protuberance. The second injection site was 1.5 cm superiorly and laterally to the first injection site.  -Trapezius muscle injection was performed at 3 sites, bilaterally. The first injection site was in the upper trapezius muscle halfway between the inflection point of the neck, and the acromion. The second injection site was one half way between the acromion and the first injection site. The third injection was done between the first injection site and the inflection point of the neck.   A 200 unit bottle of Botox was used, 155 units were injected, the rest of the Botox was wasted. The patient tolerated the procedure well, there were no complications of the above procedure.  Botox NDC [id number] Lot number [id number] Expiration date: August 2018

## 2015-12-20 MED FILL — BUTALB-ACETAMIN-CAFF 50-325: 50-325-40 | 22 days supply | Qty: 90 | Fill #3

## 2015-12-21 ENCOUNTER — Other Ambulatory Visit (HOSPITAL_COMMUNITY): Payer: Self-pay | Admitting: Ophthalmology

## 2015-12-21 DIAGNOSIS — E237 Disorder of pituitary gland, unspecified: Secondary | ICD-10-CM

## 2015-12-31 ENCOUNTER — Ambulatory Visit (HOSPITAL_COMMUNITY)
Admission: RE | Admit: 2015-12-31 | Discharge: 2015-12-31 | Disposition: A | Discharge status: Home / Self Care | Payer: Medicare Other | Source: Ambulatory Visit | Attending: Ophthalmology | Admitting: Ophthalmology

## 2015-12-31 DIAGNOSIS — H53461 Homonymous bilateral field defects, right side: Secondary | ICD-10-CM | POA: Insufficient documentation

## 2015-12-31 DIAGNOSIS — E237 Disorder of pituitary gland, unspecified: Secondary | ICD-10-CM

## 2015-12-31 LAB — POCT I-STAT CREATININE: CREATININE: 0.7 mg/dL (ref 0.44–1.00)

## 2015-12-31 MED ORDER — GADOBENATE DIMEGLUMINE 529 MG/ML IV SOLN
15.0000 mL | Freq: Once | INTRAVENOUS | Status: AC | PRN
  Administered 2015-12-31: 11 mL via INTRAVENOUS

## 2016-01-01 MED FILL — ELIQUIS 5 MG TABLET: 5 | 90 days supply | Qty: 180 | Fill #2

## 2016-01-01 MED FILL — BUPROPION HCL XL 300 MG TAB: 300 | 90 days supply | Qty: 90 | Fill #1

## 2016-01-02 MED FILL — ALPRAZolam 0.5 MG TABS: 0.5 | 90 days supply | Qty: 270 | Fill #0

## 2016-01-02 MED FILL — SERTRALINE HCL 100 MG TAB: 100 | 90 days supply | Qty: 90 | Fill #0

## 2016-01-14 ENCOUNTER — Other Ambulatory Visit: Payer: Self-pay | Admitting: Obstetrics and Gynecology

## 2016-01-14 DIAGNOSIS — Z1231 Encounter for screening mammogram for malignant neoplasm of breast: Secondary | ICD-10-CM

## 2016-02-11 MED FILL — LATANOPROST 0.005% EYE DRP: 0.005 | 25 days supply | Qty: 3 | Fill #0

## 2016-02-20 ENCOUNTER — Ambulatory Visit: Payer: Medicare Other

## 2016-02-25 ENCOUNTER — Other Ambulatory Visit: Payer: Self-pay | Admitting: Neurology

## 2016-03-11 ENCOUNTER — Other Ambulatory Visit: Payer: Self-pay | Admitting: Neurology

## 2016-03-11 MED FILL — BUTALB-ACETAMIN-CAFF 50-325: 50-325-40 | 22 days supply | Qty: 90 | Fill #0

## 2016-03-11 MED FILL — SUMATRIPTAN SUCC 100 MG TAB: 100 | 90 days supply | Qty: 27 | Fill #1

## 2016-03-11 NOTE — Telephone Encounter (Signed)
Faxed printed, signed rx fioricet to pt pharmacy. [phone]. Received confirmation.

## 2016-03-12 ENCOUNTER — Encounter: Payer: Self-pay | Admitting: *Deleted

## 2016-03-12 ENCOUNTER — Ambulatory Visit
Admission: RE | Admit: 2016-03-12 | Discharge: 2016-03-12 | Disposition: A | Discharge status: Home / Self Care | Payer: Medicare Other | Source: Ambulatory Visit | Attending: Obstetrics and Gynecology | Admitting: Obstetrics and Gynecology

## 2016-03-12 DIAGNOSIS — Z1231 Encounter for screening mammogram for malignant neoplasm of breast: Secondary | ICD-10-CM

## 2016-03-12 NOTE — Progress Notes (Signed)
Inititiated PA bultab-acetamin-caff 50-325-40 on covermymeds. Key: [id number]. Awaiting response from insurance company.

## 2016-03-12 NOTE — Progress Notes (Signed)
Received fax request from [employer] for additional info for PA. Faxed back completed paperwork to [phone]. Received confirmation. Awaiting response. [id number].

## 2016-03-18 ENCOUNTER — Encounter: Payer: Self-pay | Admitting: Neurology

## 2016-03-18 NOTE — Progress Notes (Signed)
PA was denied. CW,MD wrote appeal letter. Faxed to [employer] appeals and Grievance at [phone]. Member ID: [id number]. Ref#: [id number]. Received confirmation, awaiting response.

## 2016-03-20 NOTE — Progress Notes (Signed)
Called and spoke w/ [employer]. They stated PA overturned and appeal approved. Approval#: [id number] Approved until 02/02/17.

## 2016-03-21 ENCOUNTER — Ambulatory Visit (INDEPENDENT_AMBULATORY_CARE_PROVIDER_SITE_OTHER): Payer: Medicare Other | Admitting: Neurology

## 2016-03-21 ENCOUNTER — Encounter: Payer: Self-pay | Admitting: Neurology

## 2016-03-21 VITALS — BP 122/75 | HR 71 | Ht 67.0 in

## 2016-03-21 DIAGNOSIS — G43719 Chronic migraine without aura, intractable, without status migrainosus: Secondary | ICD-10-CM | POA: Diagnosis not present

## 2016-03-21 MED ORDER — ACETAZOLAMIDE 125 MG PO TABS: 125.0000 mg | ORAL_TABLET | Freq: Two times a day (BID) | ORAL | 0 refills | Status: DC

## 2016-03-21 NOTE — Procedures (Signed)
     BOTOX PROCEDURE NOTE FOR MIGRAINE HEADACHE   HISTORY: Suzanne [name] is a [age]-year-old patient with a history of intractable migraine headache. She has responded quite well to Botox injections. The patient has had some worsening headaches associated with the use of eyedrops for glaucoma. She is only having to severe headaches a month, she may have more frequent mild headaches that do not require treatment. She returns for a Botox therapy.   Description of procedure:  The patient was placed in a sitting position. The standard protocol was used for Botox as follows, with 5 units of Botox injected at each site:   -Procerus muscle, midline injection  -Corrugator muscle, bilateral injection  -Frontalis muscle, bilateral injection, with 2 sites each side, medial injection was performed in the upper one third of the frontalis muscle, in the region vertical from the medial inferior edge of the superior orbital rim. The lateral injection was again in the upper one third of the forehead vertically above the lateral limbus of the cornea, 1.5 cm lateral to the medial injection site.  -Temporalis muscle injection, 4 sites, bilaterally. The first injection was 3 cm above the tragus of the ear, second injection site was 1.5 cm to 3 cm up from the first injection site in line with the tragus of the ear. The third injection site was 1.5-3 cm forward between the first 2 injection sites. The fourth injection site was 1.5 cm posterior to the second injection site.  -Occipitalis muscle injection, 3 sites, bilaterally. The first injection was done one half way between the occipital protuberance and the tip of the mastoid process behind the ear. The second injection site was done lateral and superior to the first, 1 fingerbreadth from the first injection. The third injection site was 1 fingerbreadth superiorly and medially from the first injection site.  -Cervical paraspinal muscle injection, 2 sites,  bilateral, the first injection site was 1 cm from the midline of the cervical spine, 3 cm inferior to the lower border of the occipital protuberance. The second injection site was 1.5 cm superiorly and laterally to the first injection site.  -Trapezius muscle injection was performed at 3 sites, bilaterally. The first injection site was in the upper trapezius muscle halfway between the inflection point of the neck, and the acromion. The second injection site was one half way between the acromion and the first injection site. The third injection was done between the first injection site and the inflection point of the neck.   A 200 unit bottle of Botox was used, 155 units were injected, the rest of the Botox was wasted. The patient tolerated the procedure well, there were no complications of the above procedure.  Botox NDC [id number] Lot number [id number] Expiration date August 2020

## 2016-03-21 NOTE — Progress Notes (Signed)
Please refer to Botox procedure note. 

## 2016-03-24 ENCOUNTER — Encounter: Payer: Self-pay | Admitting: Internal Medicine

## 2016-03-31 ENCOUNTER — Other Ambulatory Visit: Payer: Self-pay

## 2016-04-02 ENCOUNTER — Ambulatory Visit (INDEPENDENT_AMBULATORY_CARE_PROVIDER_SITE_OTHER): Payer: Medicare Other | Admitting: Internal Medicine

## 2016-04-02 VITALS — BP 120/70 | HR 73 | Ht 67.0 in | Wt 155.1 lb

## 2016-04-02 DIAGNOSIS — I4892 Unspecified atrial flutter: Secondary | ICD-10-CM | POA: Diagnosis not present

## 2016-04-02 DIAGNOSIS — I480 Paroxysmal atrial fibrillation: Secondary | ICD-10-CM | POA: Diagnosis not present

## 2016-04-02 MED ORDER — DILTIAZEM HCL ER COATED BEADS 120 MG PO CP24: 120.0000 mg | ORAL_CAPSULE | Freq: Every day | ORAL | 3 refills | Status: DC

## 2016-04-02 MED FILL — CARTIA XT 120 MG CAPSULE SA: 120 | 90 days supply | Qty: 90 | Fill #0

## 2016-04-02 MED FILL — acetaZOLAMIDE 125 MG TABS: 125 | 10 days supply | Qty: 20 | Fill #0

## 2016-04-02 MED FILL — ELIQUIS 5 MG TABLET: 5 | 90 days supply | Qty: 180 | Fill #3

## 2016-04-02 NOTE — Progress Notes (Signed)
Primary Care Physician: Gennette Pac, MD Primary Cardiologist: Dr. Daneen Schick  Suzanne [name] is a 66 y.o. female with here for f/u.   Doing well.  She has had 4-5 episodes of afib in the past 6 months.  + post termination symptoms of presyncope.  She is otherwise doing well and wishes to make no changes today.  Her husband is now retired (since January) and they are enjoying international travel.  They have a driving trip planned to [place]/ [place] later this year.  Today, she denies symptoms of chest pain, shortness of breath, orthopnea, PND, lower extremity edema,  syncope, or neurologic sequela. The patient is tolerating medications without difficulties and is otherwise without complaint today.   Past Medical History:  Diagnosis Date  . Atrial fibrillation ([place])    s/p ablations  . Atrial flutter ([place])   . COPD (chronic obstructive pulmonary disease) ([place])   . Degenerative arthritis   . Depression   . Frozen shoulder    Left  . Glaucoma   . H/O: rheumatic fever 1957  . History of asthma   . Migraine   . PONV (postoperative nausea and vomiting)    only with Fentanyl and Dilaudid   Past Surgical History:  Procedure Laterality Date  . 2 teeth extraction  2009  . CTI ablation  3/11  . ESOPHAGOGASTRODUODENOSCOPY (EGD) WITH PROPOFOL N/A 01/06/2013   Procedure: ESOPHAGOGASTRODUODENOSCOPY (EGD) WITH PROPOFOL;  Surgeon: Cleotis Nipper, MD;  Location: [facility];  Service: Endoscopy;  Laterality: N/A;  . KNEE SURGERY  [phone]  . pvi  2/11   & 6/11  . RHINOPLASTY  age 23  . TONSILLECTOMY  as child  . TOTAL ABDOMINAL HYSTERECTOMY  1990   right ovary removed    Current Outpatient Prescriptions  Medication Sig Dispense Refill  . acetaminophen (TYLENOL) 325 MG tablet Take 650 mg by mouth every 6 (six) hours as needed.    Marland Kitchen acetaZOLAMIDE (DIAMOX) 125 MG tablet Take 1 tablet (125 mg total) by mouth 2 (two) times daily. 20 tablet 0  . ALPRAZolam (XANAX)  0.5 MG tablet Take 0.5 mg by mouth 2 (two) times daily as needed for anxiety or sleep. Anxiety or sleep    . apixaban (ELIQUIS) 5 MG TABS tablet Take 1 tablet (5 mg total) by mouth 2 (two) times daily. 180 tablet 3  . BOTOX 200 UNITS SOLR Take as directed by MD    . buPROPion (WELLBUTRIN XL) 300 MG 24 hr tablet Take 300 mg by mouth daily with breakfast.     . butalbital-acetaminophen-caffeine (FIORICET, ESGIC) 50-325-40 MG tablet TAKE 1 TABLET BY MOUTH EVERY 6 HOURS AS NEEDED FOR HEADACHE OR MIGRAINE 90 tablet 3  . clobetasol ointment (TEMOVATE) [id number] % Apply 1 application topically daily as needed (skin).   2  . diltiazem (CARDIZEM) 30 MG tablet Cardizem 30mg  -- take 1 tablet by mouth every 4 hours AS NEEDED for heart rate >100 as long as blood pressure >100. 45 tablet 1  . promethazine (PHENERGAN) 25 MG tablet Take 1 tablet (25 mg total) by mouth every 6 (six) hours as needed for nausea or vomiting. 15 tablet 2  . sertraline (ZOLOFT) 100 MG tablet Take 100 mg by mouth daily with breakfast.     . SUMAtriptan (IMITREX) 100 MG tablet TAKE 1 TABLET BY MOUTH AT THE ONSET OF A HEADACHE DAILY AS NEEDED 27 tablet 3   No current facility-administered medications for this visit.     Allergies  Allergen Reactions  . Dilaudid [Hydromorphone Hcl] Nausea And Vomiting  . Fentanyl Nausea And Vomiting  . Ciprofloxacin Rash  . Erythromycin Rash    Social History   Social History  . Marital status: Married    Spouse name: N/A  . Number of children: 0  . Years of education: 75   Occupational History  . 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 [employer]   Social History Main Topics  . Smoking status: Current Every Day Smoker    Packs/day: 0.50    Years: 40.00  . Smokeless tobacco: Never Used  . Alcohol use Yes     Comment: 1-2 glasses of wine per year  . Drug use: No  . Sexual activity: Not on file   Other Topics Concern  . Not on file   Social History  Narrative   Lives at home w/ husband and 2 dogs   Patient is right handed.   Patient drinks one cup caffeine.    Family History  Problem Relation Age of Onset  . Cancer Mother     renal cell  . Migraines Mother   . Dementia Father   . Heart disease Father     ROS- All systems are reviewed and negative except as per the HPI above  Physical Exam: Vitals:   04/02/16 0947  BP: 120/70  Pulse: 73  SpO2: 96%  Weight: 155 lb 2 oz (70.4 kg)  Height: 5\' 7"  (1.702 m)    GEN- The patient is well appearing, alert and oriented x 3 today.   Head- normocephalic, atraumatic Eyes-  Sclera clear, conjunctiva pink Ears- hearing intact Oropharynx- clear Neck- supple,  Lungs- Clear to ausculation bilaterally, normal work of breathing Heart- Regular rate and rhythm, no murmurs, rubs or gallops, PMI not laterally displaced GI- soft, NT, ND, + BS Extremities- no clubbing, cyanosis, or edema MS- no significant deformity or atrophy Skin- no rash or lesion Psych- euthymic mood, full affect Neuro- strength and sensation are intact  EKG today reveals sinus rhythm 75 bpm, PR 144 msec, QRS 76 msec, Qtc 433 msec. Poor r wave progression   Assessment and Plan: 1.  Afib/ atrial flutter Therapeutic strategies for afib including medicine and ablation were discussed in detail with the patient today. Risk, benefits, and alternatives to EP study and radiofrequency ablation for afib were also discussed in detail today.   She wishes to make no changes at this time. I have cautioned about my concern for injury/ syncope with post termination pauses.  She is clear that she would  Prefer a conservative approach though I think that she would do very well with repeat ablation. She also declines ILR monitoring or pacing.  I have advised that she not drive when in afib due to concerns for post termination pauses and syncope.  On NOAC for Chadsvasc of 2.  Doing well with eliquis  Return to see me in 6 months She  will contact my office if problems arise or if she decides to pursue ablation in the interim.  Today, I have spent 25 minutes with the patient discussing afib management.  More than 50% of the visit time today was spent on this issue.    Thompson Grayer MD, [facility] 04/02/2016 10:09 AM

## 2016-04-02 NOTE — Patient Instructions (Signed)
Medication Instructions:  Your physician has recommended you make the following change in your medication:  1) Start Cardizem 120 mg daily   Labwork: None ordered   Testing/Procedures: None ordered   Follow-Up: Your physician wants you to follow-up in: 6 months with Dr Rayann Heman Dennis Bast will receive a reminder letter in the mail two months in advance. If you don't receive a letter, please call our office to schedule the follow-up appointment.   Any Other Special Instructions Will Be Listed Below (If Applicable).     If you need a refill on your cardiac medications before your next appointment, please call your pharmacy.

## 2016-04-11 MED FILL — SERTRALINE HCL 100 MG TAB: 100 | 90 days supply | Qty: 90 | Fill #1

## 2016-04-16 MED FILL — BUPROPION HCL XL 300 MG TAB: 300 | 90 days supply | Qty: 90 | Fill #0

## 2016-05-14 MED FILL — BUTALB-ACETAMIN-CAFF 50-325: 50-325-40 | 22 days supply | Qty: 90 | Fill #1

## 2016-05-26 ENCOUNTER — Encounter: Payer: Self-pay | Admitting: Neurology

## 2016-05-26 ENCOUNTER — Ambulatory Visit (INDEPENDENT_AMBULATORY_CARE_PROVIDER_SITE_OTHER): Payer: Medicare Other | Admitting: Neurology

## 2016-05-26 VITALS — BP 119/70 | HR 63 | Ht 67.0 in | Wt 155.0 lb

## 2016-05-26 DIAGNOSIS — G43719 Chronic migraine without aura, intractable, without status migrainosus: Secondary | ICD-10-CM

## 2016-05-26 MED ORDER — PROMETHAZINE HCL 25 MG PO TABS: 25.0000 mg | ORAL_TABLET | Freq: Four times a day (QID) | ORAL | 2 refills | Status: DC | PRN

## 2016-05-26 MED FILL — PROMETHAZINE 25 MG TABLET: 25 | 4 days supply | Qty: 15 | Fill #0

## 2016-05-26 NOTE — Progress Notes (Signed)
Reason for visit: Migraine headache  Suzanne [name] is an 66 y.o. female  History of present illness:  Suzanne [name] is a 66 year old right-handed white female with a history of intractable migraine headaches. She returns for a revisit. She has had ongoing problems with atrial fibrillation and atrial flutter. She was placed on a higher dose of Cardizem but this activated her headaches significantly. The patient has traveled to [place], the change in the altitude and the air travel also worsened the headache even with the use of Diamox. The patient clearly has gained benefit with Botox. She is having 2 or 3 headache days a week, the headaches usually respond to Fioricet or Imitrex. The headaches usually begin in the middle of the night. The headaches are more tolerable on Botox when they do occur. She returns to this office for further evaluation.   Past Medical History:  Diagnosis Date  . Atrial fibrillation (Suzanne [name])    s/p ablations  . Atrial flutter (Suzanne [name])   . COPD (chronic obstructive pulmonary disease) ([place])   . Degenerative arthritis   . Depression   . Frozen shoulder    Left  . Glaucoma   . H/O: rheumatic fever 1957  . History of asthma   . Migraine   . PONV (postoperative nausea and vomiting)    only with Fentanyl and Dilaudid    Past Surgical History:  Procedure Laterality Date  . 2 teeth extraction  2009  . CTI ablation  3/11  . ESOPHAGOGASTRODUODENOSCOPY (EGD) WITH PROPOFOL N/A 01/06/2013   Procedure: ESOPHAGOGASTRODUODENOSCOPY (EGD) WITH PROPOFOL;  Surgeon: Cleotis Nipper, MD;  Location: [facility];  Service: Endoscopy;  Laterality: N/A;  . KNEE SURGERY  [phone]  . pvi  2/11   & 6/11  . RHINOPLASTY  age 68  . TONSILLECTOMY  as child  . TOTAL ABDOMINAL HYSTERECTOMY  1990   right ovary removed    Family History  Problem Relation Age of Onset  . Cancer Mother     renal cell  . Migraines Mother   . Dementia Father   . Heart disease Father      Social history:  reports that she has been smoking.  She has a 20.00 pack-year smoking history. She has never used smokeless tobacco. She reports that she drinks alcohol. She reports that she does not use drugs.    Allergies  Allergen Reactions  . Dilaudid [Hydromorphone Hcl] Nausea And Vomiting  . Fentanyl Nausea And Vomiting  . Ciprofloxacin Rash  . Erythromycin Rash    Medications:  Prior to Admission medications   Medication Sig Start Date End Date Taking? Authorizing Provider  acetaminophen (TYLENOL) 325 MG tablet Take 650 mg by mouth every 6 (six) hours as needed.   Yes Historical Provider, MD  ALPRAZolam Duanne Moron) 0.5 MG tablet Take 0.5 mg by mouth 2 (two) times daily as needed for anxiety or sleep. Anxiety or sleep 12/21/13  Yes Historical Provider, MD  apixaban (ELIQUIS) 5 MG TABS tablet Take 1 tablet (5 mg total) by mouth 2 (two) times daily. 06/25/15  Yes Thompson Grayer, MD  BOTOX 200 UNITS SOLR Take as directed by MD 12/07/13  Yes Historical Provider, MD  buPROPion (WELLBUTRIN XL) 300 MG 24 hr tablet Take 300 mg by mouth daily with breakfast.    Yes Historical Provider, MD  butalbital-acetaminophen-caffeine (FIORICET, ESGIC) 50-325-40 MG tablet TAKE 1 TABLET BY MOUTH EVERY 6 HOURS AS NEEDED FOR HEADACHE OR MIGRAINE 03/11/16  Yes Kathrynn Ducking, MD  clobetasol ointment (TEMOVATE) 0.48 % Apply 1 application topically daily as needed (skin).  03/28/15  Yes Historical Provider, MD  diltiazem (CARDIZEM) 30 MG tablet Cardizem 30mg  -- take 1 tablet by mouth every 4 hours AS NEEDED for heart rate >100 as long as blood pressure >100. 03/19/15  Yes Sherran Needs, NP  sertraline (ZOLOFT) 100 MG tablet Take 100 mg by mouth daily with breakfast.    Yes Historical Provider, MD  SUMAtriptan (IMITREX) 100 MG tablet TAKE 1 TABLET BY MOUTH AT THE ONSET OF A HEADACHE DAILY AS NEEDED 10/09/15  Yes Kathrynn Ducking, MD  acetaZOLAMIDE (DIAMOX) 125 MG tablet Take 1 tablet (125 mg total) by mouth 2 (two)  times daily. Patient not taking: Reported on 05/26/2016 03/21/16   Kathrynn Ducking, MD  diltiazem (CARDIZEM CD) 120 MG 24 hr capsule Take 1 capsule (120 mg total) by mouth daily. Patient not taking: Reported on 05/26/2016 04/02/16 07/01/16  Thompson Grayer, MD  promethazine (PHENERGAN) 25 MG tablet Take 1 tablet (25 mg total) by mouth every 6 (six) hours as needed for nausea or vomiting. Patient not taking: Reported on 05/26/2016 05/17/14   Kathrynn Ducking, MD    ROS:  Out of a complete 14 system review of symptoms, the patient complains only of the following symptoms, and all other reviewed systems are negative.  Palpitations of the heart Migraine headache  Blood pressure 119/70, pulse 63, height 5\' 7"  (1.702 m), weight 155 lb (70.3 kg).  Physical Exam  General: The patient is alert and cooperative at the time of the examination.  Skin: No significant peripheral edema is noted.   Neurologic Exam  Mental status: The patient is alert and oriented x 3 at the time of the examination. The patient has apparent normal recent and remote memory, with an apparently normal attention span and concentration ability.   Cranial nerves: Facial symmetry is present. Speech is normal, no aphasia or dysarthria is noted. Extraocular movements are full. Visual fields are full.  Motor: The patient has good strength in all 4 extremities.  Sensory examination: Soft touch sensation is symmetric on the face, arms, and legs.  Coordination: The patient has good finger-nose-finger and heel-to-shin bilaterally.  Gait and station: The patient has a normal gait. Tandem gait is normal. Romberg is negative. No drift is seen.  Reflexes: Deep tendon reflexes are symmetric.   Assessment/Plan:  1. Intractable migraine  The patient will be given a prescription for Phenergan. She will return in 2 or 3 weeks for Botox. She will follow-up for a revisit one year. We will see her every 3 months for the Botox  injections.  Jill Alexanders MD 05/26/2016 10:34 AM  [facility] [address] [place], [place] [address]  Phone [phone] Fax [phone]

## 2016-06-03 MED FILL — DORZOLAMIDE-TIMOLOL EYE DRP: 22.3-6.8 | 50 days supply | Qty: 10 | Fill #0

## 2016-06-11 ENCOUNTER — Telehealth: Payer: Self-pay | Admitting: Neurology

## 2016-06-11 NOTE — Telephone Encounter (Signed)
Can you let me know if this patient owes a balance for their last injection?

## 2016-06-19 ENCOUNTER — Encounter: Payer: Self-pay | Admitting: Neurology

## 2016-06-19 ENCOUNTER — Ambulatory Visit (INDEPENDENT_AMBULATORY_CARE_PROVIDER_SITE_OTHER): Payer: Medicare Other | Admitting: Neurology

## 2016-06-19 VITALS — BP 107/63 | HR 59 | Temp 98.6°F | Wt 153.8 lb

## 2016-06-19 DIAGNOSIS — G43719 Chronic migraine without aura, intractable, without status migrainosus: Secondary | ICD-10-CM | POA: Diagnosis not present

## 2016-06-19 NOTE — Procedures (Signed)
     BOTOX PROCEDURE NOTE FOR MIGRAINE HEADACHE   HISTORY: Suzanne [name] is a 66 year old patient with a history of intractable migraine headaches. She clearly has gained benefit with the Botox, she will start having more frequent headaches within several weeks prior to the next injection. More recently, she has not had any headaches over the last week which is unusual. She comes in for her Botox therapy.   Description of procedure:  The patient was placed in a sitting position. The standard protocol was used for Botox as follows, with 5 units of Botox injected at each site:   -Procerus muscle, midline injection  -Corrugator muscle, bilateral injection  -Frontalis muscle, bilateral injection, with 2 sites each side, medial injection was performed in the upper one third of the frontalis muscle, in the region vertical from the medial inferior edge of the superior orbital rim. The lateral injection was again in the upper one third of the forehead vertically above the lateral limbus of the cornea, 1.5 cm lateral to the medial injection site.  -Temporalis muscle injection, 4 sites, bilaterally. The first injection was 3 cm above the tragus of the ear, second injection site was 1.5 cm to 3 cm up from the first injection site in line with the tragus of the ear. The third injection site was 1.5-3 cm forward between the first 2 injection sites. The fourth injection site was 1.5 cm posterior to the second injection site.  -Occipitalis muscle injection, 3 sites, bilaterally. The first injection was done one half way between the occipital protuberance and the tip of the mastoid process behind the ear. The second injection site was done lateral and superior to the first, 1 fingerbreadth from the first injection. The third injection site was 1 fingerbreadth superiorly and medially from the first injection site.  -Cervical paraspinal muscle injection, 2 sites, bilateral, the first injection site was 1 cm  from the midline of the cervical spine, 3 cm inferior to the lower border of the occipital protuberance. The second injection site was 1.5 cm superiorly and laterally to the first injection site.  -Trapezius muscle injection was performed at 3 sites, bilaterally. The first injection site was in the upper trapezius muscle halfway between the inflection point of the neck, and the acromion. The second injection site was one half way between the acromion and the first injection site. The third injection was done between the first injection site and the inflection point of the neck.   A 200 unit bottle of Botox was used, 155 units were injected, the rest of the Botox was wasted. The patient tolerated the procedure well, there were no complications of the above procedure.  Botox NDC 3838-1840-37 Lot number [id number] Expiration date 01/2019

## 2016-06-19 NOTE — Progress Notes (Signed)
Botox 200 units/vial x 1 vials from [facility] [phone] Lot [id number] Exp 12 2020  Diluted in 4 ml of Bacteriostatic 0.9% NaCl NDC 2761-4709-29 Lot [id number] [id number]

## 2016-06-26 ENCOUNTER — Other Ambulatory Visit: Payer: Self-pay | Admitting: Internal Medicine

## 2016-06-26 MED FILL — ALPRAZolam 0.5 MG TABS: 0.5 | 90 days supply | Qty: 270 | Fill #1

## 2016-06-26 MED FILL — ELIQUIS 5 MG TABLET: 5 | 90 days supply | Qty: 180 | Fill #0

## 2016-07-11 MED FILL — BUPROPION HCL XL 300 MG TAB: 300 | 90 days supply | Qty: 90 | Fill #1

## 2016-07-11 MED FILL — SERTRALINE HCL 100 MG TAB: 100 | 90 days supply | Qty: 90 | Fill #2

## 2016-08-11 MED FILL — BUTALB-ACETAMIN-CAFF 50-325: 50-325-40 | 22 days supply | Qty: 90 | Fill #2

## 2016-08-11 MED FILL — SUMATRIPTAN SUCC 100 MG TAB: 100 | 90 days supply | Qty: 27 | Fill #2

## 2016-09-18 ENCOUNTER — Other Ambulatory Visit: Payer: Self-pay | Admitting: Neurology

## 2016-09-25 ENCOUNTER — Ambulatory Visit (INDEPENDENT_AMBULATORY_CARE_PROVIDER_SITE_OTHER): Payer: Medicare Other | Admitting: Neurology

## 2016-09-25 ENCOUNTER — Telehealth: Payer: Self-pay | Admitting: Neurology

## 2016-09-25 ENCOUNTER — Encounter: Payer: Self-pay | Admitting: Neurology

## 2016-09-25 VITALS — BP 127/71 | HR 63

## 2016-09-25 DIAGNOSIS — G43719 Chronic migraine without aura, intractable, without status migrainosus: Secondary | ICD-10-CM | POA: Diagnosis not present

## 2016-09-25 MED ORDER — ONABOTULINUMTOXINA 100 UNITS IJ SOLR
200.0000 [IU] | Freq: Once | INTRAMUSCULAR | Status: AC
  Administered 2016-09-25: 200 [IU] via INTRAMUSCULAR

## 2016-09-25 NOTE — Procedures (Signed)
     BOTOX PROCEDURE NOTE FOR MIGRAINE HEADACHE   HISTORY: Suzanne [name] is a 66 year old patient with a history of intractable migraine headaches. She has gained significant benefit with use of Botox injections, the headaches start returning about 10 days prior to the next injection. She is tolerating the treatments well.   Description of procedure:  The patient was placed in a sitting position. The standard protocol was used for Botox as follows, with 5 units of Botox injected at each site:   -Procerus muscle, midline injection  -Corrugator muscle, bilateral injection  -Frontalis muscle, bilateral injection, with 2 sites each side, medial injection was performed in the upper one third of the frontalis muscle, in the region vertical from the medial inferior edge of the superior orbital rim. The lateral injection was again in the upper one third of the forehead vertically above the lateral limbus of the cornea, 1.5 cm lateral to the medial injection site.  -Temporalis muscle injection, 4 sites, bilaterally. The first injection was 3 cm above the tragus of the ear, second injection site was 1.5 cm to 3 cm up from the first injection site in line with the tragus of the ear. The third injection site was 1.5-3 cm forward between the first 2 injection sites. The fourth injection site was 1.5 cm posterior to the second injection site.  -Occipitalis muscle injection, 3 sites, bilaterally. The first injection was done one half way between the occipital protuberance and the tip of the mastoid process behind the ear. The second injection site was done lateral and superior to the first, 1 fingerbreadth from the first injection. The third injection site was 1 fingerbreadth superiorly and medially from the first injection site.  -Cervical paraspinal muscle injection, 2 sites, bilateral, the first injection site was 1 cm from the midline of the cervical spine, 3 cm inferior to the lower border of the  occipital protuberance. The second injection site was 1.5 cm superiorly and laterally to the first injection site.  -Trapezius muscle injection was performed at 3 sites, bilaterally. The first injection site was in the upper trapezius muscle halfway between the inflection point of the neck, and the acromion. The second injection site was one half way between the acromion and the first injection site. The third injection was done between the first injection site and the inflection point of the neck.   A 200 unit bottle of Botox was used, 155 units were injected, the rest of the Botox was wasted. The patient tolerated the procedure well, there were no complications of the above procedure.  Botox NDC 3762-8315-17 Lot number [id number] Expiration date March 2021

## 2016-09-25 NOTE — Progress Notes (Signed)
Please refer to Botox procedure note. 

## 2016-09-25 NOTE — Telephone Encounter (Signed)
Hey I'm sorry I didn't schedule her botox appointment because I didn't see any office slots available and I wasn't sure if we are allow to use his New patient slots or EMG slots. When you get a chance can you call the patient to schedule. She said she can pretty much do anytime of the day.

## 2016-09-29 ENCOUNTER — Ambulatory Visit (INDEPENDENT_AMBULATORY_CARE_PROVIDER_SITE_OTHER): Payer: Medicare Other | Admitting: Internal Medicine

## 2016-09-29 ENCOUNTER — Encounter: Payer: Self-pay | Admitting: Internal Medicine

## 2016-09-29 VITALS — BP 102/70 | HR 61 | Ht 66.75 in | Wt 153.8 lb

## 2016-09-29 DIAGNOSIS — I480 Paroxysmal atrial fibrillation: Secondary | ICD-10-CM

## 2016-09-29 MED ORDER — DILTIAZEM HCL 30 MG PO TABS: ORAL_TABLET | ORAL | 3 refills | Status: DC

## 2016-09-29 MED FILL — dilTIAZem HCL 30 MG TABS: 30 | 7 days supply | Qty: 45 | Fill #0

## 2016-09-29 NOTE — Progress Notes (Signed)
PCP: Hulan Fess, MD Primary Cardiologist: Dr Tamala Julian Primary EP: Dr Rayann Heman  Suzanne [name] is a 66 y.o. female who presents today for routine electrophysiology followup.  Since last being seen in our clinic, the patient reports doing reasonably well. She continues to travel and enjoy retirement.  She has afib but is not sure of her afib burden.  She also has brief dizziness of unclear etiology.  Today, she denies symptoms of chest pain, shortness of breath,  lower extremity edema,  presyncope, or syncope.  The patient is otherwise without complaint today.   Past Medical History:  Diagnosis Date  . Atrial fibrillation ([place])    s/p ablations  . Atrial flutter ([place])   . COPD (chronic obstructive pulmonary disease) ([place])   . Degenerative arthritis   . Depression   . Frozen shoulder    Left  . Glaucoma   . H/O: rheumatic fever 1957  . History of asthma   . Migraine   . PONV (postoperative nausea and vomiting)    only with Fentanyl and Dilaudid   Past Surgical History:  Procedure Laterality Date  . 2 teeth extraction  2009  . CTI ablation  3/11  . ESOPHAGOGASTRODUODENOSCOPY (EGD) WITH PROPOFOL N/A 01/06/2013   Procedure: ESOPHAGOGASTRODUODENOSCOPY (EGD) WITH PROPOFOL;  Surgeon: Cleotis Nipper, MD;  Location: [facility];  Service: Endoscopy;  Laterality: N/A;  . KNEE SURGERY  [phone]  . pvi  2/11   & 6/11  . RHINOPLASTY  age 16  . TONSILLECTOMY  as child  . TOTAL ABDOMINAL HYSTERECTOMY  1990   right ovary removed    ROS- all systems are reviewed and negatives except as per HPI above  Current Outpatient Prescriptions  Medication Sig Dispense Refill  . acetaminophen (TYLENOL) 325 MG tablet Take 650 mg by mouth every 6 (six) hours as needed (pain).     Marland Kitchen ALPRAZolam (XANAX) 0.5 MG tablet Take 0.5 mg by mouth 2 (two) times daily as needed for anxiety or sleep. Anxiety or sleep    . buPROPion (WELLBUTRIN XL) 300 MG 24 hr tablet Take 300 mg by mouth daily with  breakfast.     . butalbital-acetaminophen-caffeine (FIORICET, ESGIC) 50-325-40 MG tablet TAKE 1 TABLET BY MOUTH EVERY 6 HOURS AS NEEDED FOR HEADACHE OR MIGRAINE 90 tablet 3  . clobetasol ointment (TEMOVATE) 0.99 % Apply 1 application topically daily as needed (skin).   2  . diltiazem (CARDIZEM) 30 MG tablet Cardizem 30mg  -- take 1 tablet by mouth every 4 hours AS NEEDED for heart rate >100 as long as blood pressure >100. 45 tablet 1  . dorzolamide-timolol (COSOPT) 22.3-6.8 MG/ML ophthalmic solution 1 drop each eye , am and pm  3  . ELIQUIS 5 MG TABS tablet TAKE 1 TABLET BY MOUTH TWICE DAILY 180 tablet 3  . promethazine (PHENERGAN) 25 MG tablet Take 1 tablet (25 mg total) by mouth every 6 (six) hours as needed for nausea or vomiting. 15 tablet 2  . sertraline (ZOLOFT) 100 MG tablet Take 100 mg by mouth daily with breakfast.     . SUMAtriptan (IMITREX) 100 MG tablet TAKE 1 TABLET BY MOUTH AT THE ONSET OF A HEADACHE DAILY AS NEEDED 27 tablet 3   No current facility-administered medications for this visit.     Physical Exam: Vitals:   09/29/16 0947  BP: 102/70  Pulse: 61  SpO2: 97%  Weight: 153 lb 12.8 oz (69.8 kg)  Height: 5' 6.75" (1.695 m)    GEN- The patient  is well appearing, alert and oriented x 3 today.   Head- normocephalic, atraumatic Eyes-  Sclera clear, conjunctiva pink Ears- hearing intact Oropharynx- clear Lungs- Clear to ausculation bilaterally, normal work of breathing Heart- Regular rate and rhythm, no murmurs, rubs or gallops, PMI not laterally displaced GI- soft, NT, ND, + BS Extremities- no clubbing, cyanosis, or edema  EKG tracing ordered today is personally reviewed and shows sinus rhythm 61 bpm, PR 158 msec, nonspecific St/T changes  Assessment and Plan:  1. Afib/ atrial flutter Continues to have palpitations and also dizziness. I am concerned for increasing afib burden and post termination pauses I have advised ILR implant for afib management post ablation  and to further characterize her palpitations and dizziness.  Risks of the procedure discussed with the patient who wishes to proceed. Wishes to continue current management strategy chads2vasc score is 2. Continue eliquis Consider ablation pending results of the ILR.  Return to see me in 4 weeks  Thompson Grayer MD, [facility] 09/29/2016 10:22 AM

## 2016-09-29 NOTE — Patient Instructions (Addendum)
Medication Instructions:  Your physician recommends that you continue on your current medications as directed. Please refer to the Current Medication list given to you today.   Labwork: None ordered   Testing/Procedures:  LINQ implant 10/02/16  Please arrive at [facility] at 6:30am    Follow-Up: Your physician recommends that you schedule a follow-up appointment in: 10-14 days from 10/02/16 in device clinic for wound check and 4 weeks with Dr Rayann Heman   Any Other Special Instructions Will Be Listed Below (If Applicable).     If you need a refill on your cardiac medications before your next appointment, please call your pharmacy.

## 2016-10-02 ENCOUNTER — Encounter (HOSPITAL_COMMUNITY): Payer: Self-pay | Admitting: Internal Medicine

## 2016-10-02 ENCOUNTER — Ambulatory Visit (HOSPITAL_COMMUNITY)
Admission: RE | Admit: 2016-10-02 | Discharge: 2016-10-02 | Disposition: A | Discharge status: Home / Self Care | Payer: Medicare Other | Source: Ambulatory Visit | Attending: Internal Medicine | Admitting: Internal Medicine

## 2016-10-02 ENCOUNTER — Encounter (HOSPITAL_COMMUNITY)
Admission: RE | Disposition: A | Discharge status: Home / Self Care | Payer: Self-pay | Source: Ambulatory Visit | Attending: Internal Medicine

## 2016-10-02 DIAGNOSIS — J449 Chronic obstructive pulmonary disease, unspecified: Secondary | ICD-10-CM | POA: Diagnosis not present

## 2016-10-02 DIAGNOSIS — F329 Major depressive disorder, single episode, unspecified: Secondary | ICD-10-CM | POA: Diagnosis not present

## 2016-10-02 DIAGNOSIS — R42 Dizziness and giddiness: Secondary | ICD-10-CM | POA: Insufficient documentation

## 2016-10-02 DIAGNOSIS — I638 Other cerebral infarction: Secondary | ICD-10-CM | POA: Diagnosis not present

## 2016-10-02 DIAGNOSIS — Z791 Long term (current) use of non-steroidal anti-inflammatories (NSAID): Secondary | ICD-10-CM | POA: Insufficient documentation

## 2016-10-02 DIAGNOSIS — Z79899 Other long term (current) drug therapy: Secondary | ICD-10-CM | POA: Insufficient documentation

## 2016-10-02 DIAGNOSIS — I4891 Unspecified atrial fibrillation: Secondary | ICD-10-CM | POA: Diagnosis not present

## 2016-10-02 DIAGNOSIS — I4892 Unspecified atrial flutter: Secondary | ICD-10-CM | POA: Insufficient documentation

## 2016-10-02 DIAGNOSIS — Z7901 Long term (current) use of anticoagulants: Secondary | ICD-10-CM | POA: Diagnosis not present

## 2016-10-02 HISTORY — PX: LOOP RECORDER INSERTION: EP1214

## 2016-10-02 SURGERY — LOOP RECORDER INSERTION

## 2016-10-02 MED ORDER — LIDOCAINE-EPINEPHRINE 1 %-1:100000 IJ SOLN
INTRAMUSCULAR | Status: AC
  Filled 2016-10-02: qty 1

## 2016-10-02 MED ORDER — LIDOCAINE-EPINEPHRINE 1 %-1:100000 IJ SOLN
INTRAMUSCULAR | Status: DC | PRN
  Administered 2016-10-02: 8 mL

## 2016-10-02 SURGICAL SUPPLY — 2 items
LOOP REVEAL LINQSYS (Prosthesis & Implant Heart) ×1 IMPLANT
PACK LOOP INSERTION (CUSTOM PROCEDURE TRAY) ×2 IMPLANT

## 2016-10-02 NOTE — H&P (View-Only) (Signed)
PCP: Hulan Fess, MD Primary Cardiologist: Dr Tamala Julian Primary EP: Dr Rayann Heman  Suzanne [name] is a 66 y.o. female who presents today for routine electrophysiology followup.  Since last being seen in our clinic, the patient reports doing reasonably well. She continues to travel and enjoy retirement.  She has afib but is not sure of her afib burden.  She also has brief dizziness of unclear etiology.  Today, she denies symptoms of chest pain, shortness of breath,  lower extremity edema,  presyncope, or syncope.  The patient is otherwise without complaint today.   Past Medical History:  Diagnosis Date  . Atrial fibrillation ([place])    s/p ablations  . Atrial flutter ([place])   . COPD (chronic obstructive pulmonary disease) ([place])   . Degenerative arthritis   . Depression   . Frozen shoulder    Left  . Glaucoma   . H/O: rheumatic fever 1957  . History of asthma   . Migraine   . PONV (postoperative nausea and vomiting)    only with Fentanyl and Dilaudid   Past Surgical History:  Procedure Laterality Date  . 2 teeth extraction  2009  . CTI ablation  3/11  . ESOPHAGOGASTRODUODENOSCOPY (EGD) WITH PROPOFOL N/A 01/06/2013   Procedure: ESOPHAGOGASTRODUODENOSCOPY (EGD) WITH PROPOFOL;  Surgeon: Cleotis Nipper, MD;  Location: [facility];  Service: Endoscopy;  Laterality: N/A;  . KNEE SURGERY  [phone]  . pvi  2/11   & 6/11  . RHINOPLASTY  age 89  . TONSILLECTOMY  as child  . TOTAL ABDOMINAL HYSTERECTOMY  1990   right ovary removed    ROS- all systems are reviewed and negatives except as per HPI above  Current Outpatient Prescriptions  Medication Sig Dispense Refill  . acetaminophen (TYLENOL) 325 MG tablet Take 650 mg by mouth every 6 (six) hours as needed (pain).     Marland Kitchen ALPRAZolam (XANAX) 0.5 MG tablet Take 0.5 mg by mouth 2 (two) times daily as needed for anxiety or sleep. Anxiety or sleep    . buPROPion (WELLBUTRIN XL) 300 MG 24 hr tablet Take 300 mg by mouth daily with  breakfast.     . butalbital-acetaminophen-caffeine (FIORICET, ESGIC) 50-325-40 MG tablet TAKE 1 TABLET BY MOUTH EVERY 6 HOURS AS NEEDED FOR HEADACHE OR MIGRAINE 90 tablet 3  . clobetasol ointment (TEMOVATE) 2.95 % Apply 1 application topically daily as needed (skin).   2  . diltiazem (CARDIZEM) 30 MG tablet Cardizem 30mg  -- take 1 tablet by mouth every 4 hours AS NEEDED for heart rate >100 as long as blood pressure >100. 45 tablet 1  . dorzolamide-timolol (COSOPT) 22.3-6.8 MG/ML ophthalmic solution 1 drop each eye , am and pm  3  . ELIQUIS 5 MG TABS tablet TAKE 1 TABLET BY MOUTH TWICE DAILY 180 tablet 3  . promethazine (PHENERGAN) 25 MG tablet Take 1 tablet (25 mg total) by mouth every 6 (six) hours as needed for nausea or vomiting. 15 tablet 2  . sertraline (ZOLOFT) 100 MG tablet Take 100 mg by mouth daily with breakfast.     . SUMAtriptan (IMITREX) 100 MG tablet TAKE 1 TABLET BY MOUTH AT THE ONSET OF A HEADACHE DAILY AS NEEDED 27 tablet 3   No current facility-administered medications for this visit.     Physical Exam: Vitals:   09/29/16 0947  BP: 102/70  Pulse: 61  SpO2: 97%  Weight: 153 lb 12.8 oz (69.8 kg)  Height: 5' 6.75" (1.695 m)    GEN- The patient  is well appearing, alert and oriented x 3 today.   Head- normocephalic, atraumatic Eyes-  Sclera clear, conjunctiva pink Ears- hearing intact Oropharynx- clear Lungs- Clear to ausculation bilaterally, normal work of breathing Heart- Regular rate and rhythm, no murmurs, rubs or gallops, PMI not laterally displaced GI- soft, NT, ND, + BS Extremities- no clubbing, cyanosis, or edema  EKG tracing ordered today is personally reviewed and shows sinus rhythm 61 bpm, PR 158 msec, nonspecific St/T changes  Assessment and Plan:  1. Afib/ atrial flutter Continues to have palpitations and also dizziness. I am concerned for increasing afib burden and post termination pauses I have advised ILR implant for afib management post ablation  and to further characterize her palpitations and dizziness.  Risks of the procedure discussed with the patient who wishes to proceed. Wishes to continue current management strategy chads2vasc score is 2. Continue eliquis Consider ablation pending results of the ILR.  Return to see me in 4 weeks  Thompson Grayer MD, [facility] 09/29/2016 10:22 AM

## 2016-10-02 NOTE — Interval H&P Note (Signed)
History and Physical Interval Note:  10/02/2016 6:52 AM  Suzanne [name]  has presented today for surgery, with the diagnosis of afib  The various methods of treatment have been discussed with the patient and family. After consideration of risks, benefits and other options for treatment, the patient has consented to  Procedure(s): LOOP RECORDER INSERTION (N/A) as a surgical intervention .  The patient's history has been reviewed, patient examined, no change in status, stable for surgery.  I have reviewed the patient's chart and labs.  Questions were answered to the patient's satisfaction.     Thompson Grayer

## 2016-10-03 MED FILL — DORZOLAMIDE-TIMOLOL EYE DRP: 22.3-6.8 | 50 days supply | Qty: 10 | Fill #1

## 2016-10-03 MED FILL — ELIQUIS 5 MG TABLET: 5 | 90 days supply | Qty: 180 | Fill #1

## 2016-10-03 MED FILL — SERTRALINE HCL 100 MG TAB: 100 | 90 days supply | Qty: 90 | Fill #3

## 2016-10-08 MED FILL — BUPROPION HCL XL 300 MG TAB: 300 | 90 days supply | Qty: 90 | Fill #0

## 2016-10-13 ENCOUNTER — Emergency Department (HOSPITAL_COMMUNITY): Payer: Medicare Other

## 2016-10-13 ENCOUNTER — Encounter (HOSPITAL_COMMUNITY): Payer: Self-pay | Admitting: Emergency Medicine

## 2016-10-13 ENCOUNTER — Emergency Department (HOSPITAL_COMMUNITY)
Admission: EM | Admit: 2016-10-13 | Discharge: 2016-10-13 | Disposition: A | Discharge status: Home / Self Care | Payer: Medicare Other | Attending: Physician Assistant | Admitting: Physician Assistant

## 2016-10-13 DIAGNOSIS — Z79899 Other long term (current) drug therapy: Secondary | ICD-10-CM | POA: Diagnosis not present

## 2016-10-13 DIAGNOSIS — Y929 Unspecified place or not applicable: Secondary | ICD-10-CM | POA: Diagnosis not present

## 2016-10-13 DIAGNOSIS — W19XXXA Unspecified fall, initial encounter: Secondary | ICD-10-CM | POA: Insufficient documentation

## 2016-10-13 DIAGNOSIS — S022XXA Fracture of nasal bones, initial encounter for closed fracture: Secondary | ICD-10-CM | POA: Insufficient documentation

## 2016-10-13 DIAGNOSIS — J449 Chronic obstructive pulmonary disease, unspecified: Secondary | ICD-10-CM | POA: Insufficient documentation

## 2016-10-13 DIAGNOSIS — Y939 Activity, unspecified: Secondary | ICD-10-CM | POA: Diagnosis not present

## 2016-10-13 DIAGNOSIS — Z7902 Long term (current) use of antithrombotics/antiplatelets: Secondary | ICD-10-CM | POA: Diagnosis not present

## 2016-10-13 DIAGNOSIS — F17210 Nicotine dependence, cigarettes, uncomplicated: Secondary | ICD-10-CM | POA: Diagnosis not present

## 2016-10-13 DIAGNOSIS — Y999 Unspecified external cause status: Secondary | ICD-10-CM | POA: Diagnosis not present

## 2016-10-13 DIAGNOSIS — J45909 Unspecified asthma, uncomplicated: Secondary | ICD-10-CM | POA: Diagnosis not present

## 2016-10-13 DIAGNOSIS — S0992XA Unspecified injury of nose, initial encounter: Secondary | ICD-10-CM | POA: Diagnosis present

## 2016-10-13 MED ORDER — OXYCODONE-ACETAMINOPHEN 5-325 MG PO TABS: 1.0000 | ORAL_TABLET | Freq: Four times a day (QID) | ORAL | 0 refills | Status: DC | PRN

## 2016-10-13 MED ORDER — OXYMETAZOLINE HCL 0.05 % NA SOLN
1.0000 | Freq: Once | NASAL | Status: AC
  Administered 2016-10-13: 1 via NASAL
  Filled 2016-10-13: qty 15

## 2016-10-13 MED ORDER — ACETAMINOPHEN 325 MG PO TABS
650.0000 mg | ORAL_TABLET | Freq: Once | ORAL | Status: AC
  Administered 2016-10-13: 650 mg via ORAL
  Filled 2016-10-13: qty 2

## 2016-10-13 MED ORDER — ONDANSETRON 4 MG PO TBDP
4.0000 mg | ORAL_TABLET | Freq: Three times a day (TID) | ORAL | 0 refills | Status: DC | PRN
Start: 2016-10-13 — End: 2017-05-28

## 2016-10-13 MED FILL — OXYCOD/ACETAMINOPHEN 5-325M: 5-325 | 2 days supply | Qty: 7 | Fill #0

## 2016-10-13 MED FILL — ONDANSETRON ODT 4 MG TABLET: 4 | 7 days supply | Qty: 20 | Fill #0

## 2016-10-13 NOTE — Discharge Instructions (Signed)
Keep head of bed elevated 45. No nose blowing. Please follow-up with ENT in 1 week.

## 2016-10-13 NOTE — ED Notes (Signed)
Pt ambulated to the bathroom without assistance. Pt ambulating well.

## 2016-10-13 NOTE — ED Provider Notes (Signed)
[place] DEPT Provider Note   CSN: [id number] Arrival date & time: 10/13/16  1042     History   Chief Complaint Chief Complaint  Patient presents with  . Fall    HPI Suzanne [name] is a 65 y.o. female.  HPI   Patient is a 66 year old female on Eloquias for paroxysmal A. fib presenting with mechanical fall. Patient fell and struck her face on the ground. Patient has updated tetanus. Patient has a Holter monitor on because of long pauses in the past. Is managed by Dr. Rayann Heman. She feels this fall was mechanical in nature.  Past Medical History:  Diagnosis Date  . Atrial fibrillation ([place])    s/p ablations  . Atrial flutter ([place])   . COPD (chronic obstructive pulmonary disease) ([place])   . Degenerative arthritis   . Depression   . Frozen shoulder    Left  . Glaucoma   . H/O: rheumatic fever 1957  . History of asthma   . Migraine   . PONV (postoperative nausea and vomiting)    only with Fentanyl and Dilaudid    Patient Active Problem List   Diagnosis Date Noted  . Intractable chronic migraine without aura 05/17/2014  . Tobacco abuse 10/03/2013  . S/P ablation of atrial fibrillation 05/16/2013  . DEPRESSION, CHRONIC 02/01/2009  . Migraine 02/01/2009  . ATRIAL FIBRILLATION 02/01/2009  . SINOATRIAL NODE DYSFUNCTION 02/01/2009  . ASTHMA 02/01/2009  . COPD 02/01/2009    Past Surgical History:  Procedure Laterality Date  . 2 teeth extraction  2009  . CTI ablation  3/11  . ESOPHAGOGASTRODUODENOSCOPY (EGD) WITH PROPOFOL N/A 01/06/2013   Procedure: ESOPHAGOGASTRODUODENOSCOPY (EGD) WITH PROPOFOL;  Surgeon: Cleotis Nipper, MD;  Location: [facility];  Service: Endoscopy;  Laterality: N/A;  . KNEE SURGERY  [phone]  . LOOP RECORDER INSERTION N/A 10/02/2016   Procedure: LOOP RECORDER INSERTION;  Surgeon: Thompson Grayer, MD;  Location: [facility];  Service: Cardiovascular;  Laterality: N/A;  . pvi  2/11   & 6/11  . RHINOPLASTY  age 57  .  TONSILLECTOMY  as child  . TOTAL ABDOMINAL HYSTERECTOMY  1990   right ovary removed    OB History    No data available       Home Medications    Prior to Admission medications   Medication Sig Start Date End Date Taking? Authorizing Provider  acetaminophen (TYLENOL) 325 MG tablet Take 650 mg by mouth every 6 (six) hours as needed (pain).    Yes [provider]  ALPRAZolam Duanne Moron) 0.5 MG tablet Take 0.5 mg by mouth 2 (two) times daily as needed for anxiety or sleep. Anxiety or sleep 12/21/13  Yes [provider]  buPROPion (WELLBUTRIN XL) 300 MG 24 hr tablet Take 300 mg by mouth daily with breakfast.    Yes [provider]  butalbital-acetaminophen-caffeine (FIORICET, ESGIC) 50-325-40 MG tablet TAKE 1 TABLET BY MOUTH EVERY 6 HOURS AS NEEDED FOR HEADACHE OR MIGRAINE 03/11/16  Yes Kathrynn Ducking, MD  diltiazem (CARDIZEM) 30 MG tablet Cardizem 30mg  -- take 1 tablet by mouth every 4 hours AS NEEDED for heart rate >100 as long as blood pressure >100. 09/29/16  Yes Allred, Jeneen Rinks, MD  dorzolamide-timolol (COSOPT) 22.3-6.8 MG/ML ophthalmic solution Place 1 drop into both eyes 2 (two) times daily. 1 drop each eye , am and pm 06/03/16  Yes [provider]  ELIQUIS 5 MG TABS tablet TAKE 1 TABLET BY MOUTH TWICE DAILY 06/26/16  Yes Allred, Jeneen Rinks,  MD  OnabotulinumtoxinA (BOTOX IJ) Inject as directed every 3 (three) months.   Yes [provider]  promethazine (PHENERGAN) 25 MG tablet Take 1 tablet (25 mg total) by mouth every 6 (six) hours as needed for nausea or vomiting. 05/26/16  Yes Kathrynn Ducking, MD  sertraline (ZOLOFT) 100 MG tablet Take 100 mg by mouth daily with breakfast.    Yes [provider]  SUMAtriptan (IMITREX) 100 MG tablet TAKE 1 TABLET BY MOUTH AT THE ONSET OF A HEADACHE DAILY AS NEEDED 10/09/15  Yes Kathrynn Ducking, MD  ondansetron (ZOFRAN ODT) 4 MG disintegrating tablet Take 1 tablet (4 mg total) by mouth every 8 (eight) hours as  needed for nausea or vomiting. 10/13/16   Toluwani Yadav Lyn, MD  oxyCODONE-acetaminophen (PERCOCET/ROXICET) 5-325 MG tablet Take 1 tablet by mouth every 6 (six) hours as needed for severe pain. 10/13/16   Eilyn Polack, Fredia Sorrow, MD    Family History Family History  Problem Relation Age of Onset  . Cancer Mother        renal cell  . Migraines Mother   . Dementia Father   . Heart disease Father     Social History Social History  Substance Use Topics  . Smoking status: Current Every Day Smoker    Packs/day: 0.50    Years: 40.00  . Smokeless tobacco: Never Used  . Alcohol use Yes     Comment: 1-2 glasses of wine per year     Allergies   Dilaudid [hydromorphone hcl]; Fentanyl; Ciprofloxacin; and Erythromycin   Review of Systems Review of Systems  Constitutional: Negative for activity change.  Respiratory: Negative for shortness of breath.   Cardiovascular: Negative for chest pain.  Gastrointestinal: Negative for abdominal pain.  Neurological: Negative for dizziness and weakness.     Physical Exam Updated Vital Signs BP 116/67   Pulse 63   Temp 97.9 F (36.6 C) (Oral)   Resp 18   Ht 5\' 7"  (1.702 m)   Wt 69.9 kg (154 lb)   SpO2 97%   BMI 24.12 kg/m   Physical Exam  Constitutional: She is oriented to person, place, and time. She appears well-developed and well-nourished.  HENT:  Head: Normocephalic and atraumatic.  Right Ear: External ear normal.  Left Ear: External ear normal.  Mouth/Throat: Oropharynx is clear and moist.  Nares, no septal hematoma, bleeding L nare. Mild, swelling.     Eyes: Right eye exhibits no discharge. Left eye exhibits no discharge.  Cardiovascular: Normal rate.   Pulmonary/Chest: Effort normal and breath sounds normal.  Abdominal: Soft. She exhibits no distension. There is no tenderness.  Neurological: She is oriented to person, place, and time. No cranial nerve deficit.  Skin: Skin is warm and dry. She is not diaphoretic.    Psychiatric: She has a normal mood and affect.  Nursing note and vitals reviewed.    ED Treatments / Results  Labs (all labs ordered are listed, but only abnormal results are displayed) Labs Reviewed - No data to display  EKG  EKG Interpretation  Date/Time:  Monday October 13 2016 10:42:58 EDT Ventricular Rate:  58 PR Interval:    QRS Duration: 90 QT Interval:  420 QTC Calculation: 413 R Axis:   36 Text Interpretation:  Sinus rhythm Low voltage, precordial leads No significant change since last tracing Confirmed by Wandra Arthurs [id number]) on 10/15/2016 11:12:44 AM       Radiology No results found.  Procedures Procedures (including critical care time)  Medications Ordered in ED Medications  oxymetazoline (AFRIN) 0.05 % nasal spray 1 spray (1 spray Each Nare Given 10/13/16 1140)  acetaminophen (TYLENOL) tablet 650 mg (650 mg Oral Given 10/13/16 1139)     Initial Impression / Assessment and Plan / ED Course  I have reviewed the triage vital signs and the nursing notes.  Pertinent labs & imaging results that were available during my care of the patient were reviewed by me and considered in my medical decision making (see chart for details).     Patient is a 66 year old female on Eloquias for paroxysmal A. fib presenting with mechanical fall. Patient fell and struck her face on the ground. Patient has updated tetanus. Patient has a Holter monitor on because of long pauses in the past. Is managed by Dr. Rayann Heman. She feels this fall was mechanical in nature.  10:39 AM  Will get CT imaging. CT shows nasal fracture. No bleeding here noted. BEcause of swelling, will refer to outpatient ENT. Precautions expressed, no nose blowing etc.  Will discharge patient with outpatient follow up.   Final Clinical Impressions(s) / ED Diagnoses   Final diagnoses:  Closed fracture of nasal bone, initial encounter    New Prescriptions Discharge Medication List as of 10/13/2016  2:47 PM     START taking these medications   Details  ondansetron (ZOFRAN ODT) 4 MG disintegrating tablet Take 1 tablet (4 mg total) by mouth every 8 (eight) hours as needed for nausea or vomiting., Starting Mon 10/13/2016, Print    oxyCODONE-acetaminophen (PERCOCET/ROXICET) 5-325 MG tablet Take 1 tablet by mouth every 6 (six) hours as needed for severe pain., Starting Mon 10/13/2016, Print         Sandra Brents, Fredia Sorrow, MD 10/16/16 1039

## 2016-10-13 NOTE — ED Triage Notes (Signed)
Pt felt like she was going to fall- but states she did not lose consciousness. Pt hit floor- face first. Pt remembers hitting her nose and hearing a crunch. Pt is on eliquis and has had a steady flow of blood from nose. Pt is alert and oriented. Pt states she had a loop recorder placed two weeks ago bc she had been having pauses.  Pt's BP 130/78, HR 66, resp 16, 95% on room air.

## 2016-10-15 ENCOUNTER — Ambulatory Visit (INDEPENDENT_AMBULATORY_CARE_PROVIDER_SITE_OTHER): Payer: Self-pay | Admitting: *Deleted

## 2016-10-15 DIAGNOSIS — I480 Paroxysmal atrial fibrillation: Secondary | ICD-10-CM

## 2016-10-15 LAB — CUP PACEART INCLINIC DEVICE CHECK
Implantable Pulse Generator Implant Date: 20180830
MDC IDC SESS DTM: 20180912102234

## 2016-10-15 NOTE — Progress Notes (Signed)
Loop wound check appointment. Steri-strips removed. Wound without redness or edema. Incision edges approximated, wound well healed. Normal device function. Battery status: good. R-waves 0.43mV. No symptom, tachy, pause, brady, or AF episodes. Patient educated about wound care, Carelink monitor and symptom activator, practiced use in clinic today. Monthly summary reports and ROV with JA on 11/05/16.

## 2016-10-20 DIAGNOSIS — S0992XA Unspecified injury of nose, initial encounter: Secondary | ICD-10-CM | POA: Insufficient documentation

## 2016-11-03 ENCOUNTER — Ambulatory Visit (INDEPENDENT_AMBULATORY_CARE_PROVIDER_SITE_OTHER): Payer: Medicare Other | Admitting: *Deleted

## 2016-11-03 DIAGNOSIS — I480 Paroxysmal atrial fibrillation: Secondary | ICD-10-CM | POA: Diagnosis not present

## 2016-11-04 NOTE — Progress Notes (Signed)
Carelink Summary Report / Loop Recorder 

## 2016-11-05 ENCOUNTER — Ambulatory Visit (INDEPENDENT_AMBULATORY_CARE_PROVIDER_SITE_OTHER): Payer: Medicare Other | Admitting: Internal Medicine

## 2016-11-05 ENCOUNTER — Encounter: Payer: Self-pay | Admitting: Internal Medicine

## 2016-11-05 VITALS — BP 108/76 | HR 74 | Ht 67.0 in | Wt 155.0 lb

## 2016-11-05 DIAGNOSIS — G4733 Obstructive sleep apnea (adult) (pediatric): Secondary | ICD-10-CM | POA: Diagnosis not present

## 2016-11-05 DIAGNOSIS — I480 Paroxysmal atrial fibrillation: Secondary | ICD-10-CM | POA: Diagnosis not present

## 2016-11-05 DIAGNOSIS — I495 Sick sinus syndrome: Secondary | ICD-10-CM | POA: Diagnosis not present

## 2016-11-05 DIAGNOSIS — I4892 Unspecified atrial flutter: Secondary | ICD-10-CM

## 2016-11-05 LAB — CUP PACEART INCLINIC DEVICE CHECK
MDC IDC PG IMPLANT DT: 20180830
MDC IDC SESS DTM: 20181003144801

## 2016-11-05 NOTE — Patient Instructions (Addendum)
Medication Instructions:  Your physician recommends that you continue on your current medications as directed. Please refer to the Current Medication list given to you today.   Labwork: Your physician recommends that you return for lab work on 11/2 at 2:00pm.  You do not have to fast.   Testing/Procedures: Your physician has requested that you have cardiac CT. Cardiac computed tomography (CT) is a painless test that uses an x-ray machine to take clear, detailed pictures of your heart. For further information please visit [email or web address]. Please follow instruction sheet as given.--- week of 11/5 prior to ablation on 11/13.  Office will call once approved with insurance.  Please arrive at the [facility] main entrance of [facility] at       (30-45 minutes prior to test start time)  [facility] [address] [place], [place] [address] [phone]  Proceed to the [facility] Radiology Department (First Floor).  Please follow these instructions carefully (unless otherwise directed):    On the Night Before the Test: . Drink plenty of water. . Do not consume any caffeinated/decaffeinated beverages or chocolate 12 hours prior to your test. . Do not take any antihistamines 12 hours prior to your test.    On the Day of the Test: . Drink plenty of water. Do not drink any water within one hour of the test. . Do not eat any food 4 hours prior to the test. . You may take your regular medications prior to the test.    After the Test: . Drink plenty of water. . After receiving IV contrast, you may experience a mild flushed feeling. This is normal. . On occasion, you may experience a mild rash up to 24 hours after the test. This is not dangerous. If this occurs, you can take Benadryl 25 mg and increase your fluid intake. . If you experience trouble breathing, this can be serious. If it is severe call 911 IMMEDIATELY. If it is mild, please call our office. . If  you take any of these medications: Glipizide/Metformin, Avandament, Glucavance, please do not take 48 hours after completing test.  Your physician has recommended that you have an ablation. Catheter ablation is a medical procedure used to treat some cardiac arrhythmias (irregular heartbeats). During catheter ablation, a long, thin, flexible tube is put into a blood vessel in your groin (upper thigh), or neck. This tube is called an ablation catheter. It is then guided to your heart through the blood vessel. Radio frequency waves destroy small areas of heart tissue where abnormal heartbeats may cause an arrhythmia to start. Please see the instruction sheet given to you today.---12/16/16  Please arrive at [facility] at 5:30AM Do not eat or drink after midnight the night prior to the procedure Do not take any medications the morning of the test Plan for one night stay Will need someone to drive you home at discharge       Follow-Up:  You have been referred to Dr Mylinda Latina study for snoring  Your physician recommends that you schedule a follow-up appointment in: 4 weeks from 12/16/16 in afib clinic and 3 months from 12/16/16 with Dr Rayann Heman      Any Other Special Instructions Will Be Listed Below (If Applicable).     If you need a refill on your cardiac medications before your next appointment, please call your pharmacy.

## 2016-11-05 NOTE — Progress Notes (Signed)
PCP: Hulan Fess, MD Primary Cardiologist: Dr Tamala Julian Primary EP: Dr Rayann Heman  Suzanne [name] is a 66 y.o. female who presents today for routine electrophysiology followup.  Since last being seen in our clinic, the patient reports doing reasonably well.  Her ILR has documented afib as well as post termination pauses of up to 6 seconds.  Today, she denies symptoms of palpitations, chest pain, shortness of breath,  lower extremity edema, dizziness, presyncope, or syncope.  The patient is otherwise without complaint today.   Past Medical History:  Diagnosis Date  . Atrial fibrillation ([place])    s/p ablations  . Atrial flutter ([place])   . COPD (chronic obstructive pulmonary disease) ([place])   . Degenerative arthritis   . Depression   . Frozen shoulder    Left  . Glaucoma   . H/O: rheumatic fever 1957  . History of asthma   . Migraine   . PONV (postoperative nausea and vomiting)    only with Fentanyl and Dilaudid   Past Surgical History:  Procedure Laterality Date  . 2 teeth extraction  2009  . CTI ablation  3/11  . ESOPHAGOGASTRODUODENOSCOPY (EGD) WITH PROPOFOL N/A 01/06/2013   Procedure: ESOPHAGOGASTRODUODENOSCOPY (EGD) WITH PROPOFOL;  Surgeon: Cleotis Nipper, MD;  Location: [facility];  Service: Endoscopy;  Laterality: N/A;  . KNEE SURGERY  [phone]  . LOOP RECORDER INSERTION N/A 10/02/2016   Procedure: LOOP RECORDER INSERTION;  Surgeon: Thompson Grayer, MD;  Location: [facility];  Service: Cardiovascular;  Laterality: N/A;  . pvi  2/11   & 6/11  . RHINOPLASTY  age 62  . TONSILLECTOMY  as child  . TOTAL ABDOMINAL HYSTERECTOMY  1990   right ovary removed    ROS- all systems are reviewed and negatives except as per HPI above  Current Outpatient Prescriptions  Medication Sig Dispense Refill  . acetaminophen (TYLENOL) 325 MG tablet Take 650 mg by mouth every 6 (six) hours as needed (pain).     Marland Kitchen ALPRAZolam (XANAX) 0.5 MG tablet Take 0.5 mg by mouth 2 (two)  times daily as needed for anxiety or sleep. Anxiety or sleep    . buPROPion (WELLBUTRIN XL) 300 MG 24 hr tablet Take 300 mg by mouth daily with breakfast.     . butalbital-acetaminophen-caffeine (FIORICET, ESGIC) 50-325-40 MG tablet TAKE 1 TABLET BY MOUTH EVERY 6 HOURS AS NEEDED FOR HEADACHE OR MIGRAINE 90 tablet 3  . diltiazem (CARDIZEM) 30 MG tablet Cardizem 30mg  -- take 1 tablet by mouth every 4 hours AS NEEDED for heart rate >100 as long as blood pressure >100. 45 tablet 3  . dorzolamide-timolol (COSOPT) 22.3-6.8 MG/ML ophthalmic solution Place 1 drop into both eyes 2 (two) times daily. 1 drop each eye , am and pm  3  . ELIQUIS 5 MG TABS tablet TAKE 1 TABLET BY MOUTH TWICE DAILY 180 tablet 3  . OnabotulinumtoxinA (BOTOX IJ) Inject as directed every 3 (three) months.    . ondansetron (ZOFRAN ODT) 4 MG disintegrating tablet Take 1 tablet (4 mg total) by mouth every 8 (eight) hours as needed for nausea or vomiting. 20 tablet 0  . promethazine (PHENERGAN) 25 MG tablet Take 1 tablet (25 mg total) by mouth every 6 (six) hours as needed for nausea or vomiting. 15 tablet 2  . sertraline (ZOLOFT) 100 MG tablet Take 100 mg by mouth daily with breakfast.     . SUMAtriptan (IMITREX) 100 MG tablet TAKE 1 TABLET BY MOUTH AT THE ONSET OF A  HEADACHE DAILY AS NEEDED 27 tablet 3   No current facility-administered medications for this visit.     Physical Exam: Vitals:   11/05/16 1347  BP: 108/76  Pulse: 74  SpO2: 98%  Weight: 155 lb (70.3 kg)  Height: 5\' 7"  (1.702 m)    GEN- The patient is well appearing, alert and oriented x 3 today.   Head- normocephalic, atraumatic Eyes-  Sclera clear, conjunctiva pink Ears- hearing intact Oropharynx- clear Lungs- Clear to ausculation bilaterally, normal work of breathing Heart- Regular rate and rhythm, no murmurs, rubs or gallops, PMI not laterally displaced GI- soft, NT, ND, + BS Extremities- no clubbing, cyanosis, or edema  ILR interrogation from today is  personally reviewed and shows afib burden 2.9% with a 6 second post termination pause   Assessment and Plan:  1. Afib/ atrial flutter Given documented recurrence of afib,  I would advise repeat ablation.  She has not tolerated AADs in the past. Therapeutic strategies for afib including medicine and ablation were discussed in detail with the patient today. Risk, benefits, and alternatives to EP study and radiofrequency ablation for afib were also discussed in detail today. These risks include but are not limited to stroke, bleeding, vascular damage, tamponade, perforation, damage to the esophagus, lungs, and other structures, pulmonary vein stenosis, worsening renal function, and death. The patient understands these risk and wishes to proceed.  We will therefore proceed with catheter ablation in November (per her preference). Continue on eliquis  2. Sick sinus syndrome Post termination pauses discussed She would like to avoid pacing Hopefully, we can terminate afib and then she will no longer have these pauses If they continue, would consider leadless pacemaker  3. Snoring Given snoring and nocturnal afib, I will refer to Dr Maxwell Caul for sleep study   Thompson Grayer MD, [facility] 11/05/2016 2:00 PM

## 2016-11-06 LAB — CUP PACEART REMOTE DEVICE CHECK
Date Time Interrogation Session: 20180929200531
Implantable Pulse Generator Implant Date: 20180830

## 2016-11-11 ENCOUNTER — Encounter: Payer: Self-pay | Admitting: Internal Medicine

## 2016-11-11 MED FILL — BUTALB-ACETAMIN-CAFF 50-325: 50-325-40 | 22 days supply | Qty: 90 | Fill #3

## 2016-11-27 ENCOUNTER — Other Ambulatory Visit: Payer: Self-pay

## 2016-11-27 DIAGNOSIS — I480 Paroxysmal atrial fibrillation: Secondary | ICD-10-CM

## 2016-12-01 ENCOUNTER — Ambulatory Visit (INDEPENDENT_AMBULATORY_CARE_PROVIDER_SITE_OTHER): Payer: Medicare Other | Admitting: *Deleted

## 2016-12-01 DIAGNOSIS — I480 Paroxysmal atrial fibrillation: Secondary | ICD-10-CM | POA: Diagnosis not present

## 2016-12-02 NOTE — Progress Notes (Signed)
Carelink Summary Report / Loop Recorder 

## 2016-12-04 LAB — CUP PACEART REMOTE DEVICE CHECK
Date Time Interrogation Session: 20181029200735
MDC IDC PG IMPLANT DT: 20180830

## 2016-12-05 ENCOUNTER — Telehealth: Payer: Self-pay | Admitting: *Deleted

## 2016-12-05 ENCOUNTER — Other Ambulatory Visit: Payer: Medicare Other | Admitting: *Deleted

## 2016-12-05 DIAGNOSIS — I480 Paroxysmal atrial fibrillation: Secondary | ICD-10-CM

## 2016-12-05 NOTE — Telephone Encounter (Signed)
Spoke with patient regarding symptoms associated with 4 second pause on October 26th at 10:15. ECG appears post-termination pause. Patient states she was in [place] walking with her husband when she had a dizzy spell. Advised patient we will review the episode with Dr. Rayann Heman when he is in the office next week and will call her back if he has any further recommendations. Patient verbalized understanding.  Reminded patient of Scheduled Afib Ablation on 12/16/16.

## 2016-12-06 LAB — CBC WITH DIFFERENTIAL/PLATELET
BASOS: 0 %
Basophils Absolute: 0 10*3/uL (ref 0.0–0.2)
EOS (ABSOLUTE): 0.1 10*3/uL (ref 0.0–0.4)
EOS: 1 %
HEMATOCRIT: 37.9 % (ref 34.0–46.6)
Hemoglobin: 12.7 g/dL (ref 11.1–15.9)
Immature Grans (Abs): 0 10*3/uL (ref 0.0–0.1)
Immature Granulocytes: 0 %
LYMPHS: 25 %
Lymphocytes Absolute: 1.1 10*3/uL (ref 0.7–3.1)
MCH: 29.9 pg (ref 26.6–33.0)
MCHC: 33.5 g/dL (ref 31.5–35.7)
MCV: 89 fL (ref 79–97)
Monocytes Absolute: 0.8 10*3/uL (ref 0.1–0.9)
Monocytes: 19 %
NEUTROS ABS: 2.4 10*3/uL (ref 1.4–7.0)
NEUTROS PCT: 55 %
PLATELETS: 219 10*3/uL (ref 150–379)
RBC: 4.25 x10E6/uL (ref 3.77–5.28)
RDW: 14.1 % (ref 12.3–15.4)
WBC: 4.3 10*3/uL (ref 3.4–10.8)

## 2016-12-06 LAB — BASIC METABOLIC PANEL
BUN/Creatinine Ratio: 17 (ref 12–28)
BUN: 11 mg/dL (ref 8–27)
CALCIUM: 9.1 mg/dL (ref 8.7–10.3)
CO2: 24 mmol/L (ref 20–29)
CREATININE: 0.66 mg/dL (ref 0.57–1.00)
Chloride: 105 mmol/L (ref 96–106)
GFR, EST AFRICAN AMERICAN: 105 mL/min/{1.73_m2} (ref >59)
GFR, EST NON AFRICAN AMERICAN: 91 mL/min/{1.73_m2} (ref >59)
Glucose: 98 mg/dL (ref 65–99)
POTASSIUM: 3.9 mmol/L (ref 3.5–5.2)
Sodium: 141 mmol/L (ref 134–144)

## 2016-12-08 NOTE — Telephone Encounter (Signed)
LMOVM requesting call back to the [facility].  Gave direct phone number.  Will advise patient that Dr. Rayann Heman recommends no changes at this time pending upcoming AF ablation procedure on 12/16/16.

## 2016-12-09 ENCOUNTER — Encounter (HOSPITAL_COMMUNITY): Payer: Self-pay

## 2016-12-09 ENCOUNTER — Ambulatory Visit (HOSPITAL_COMMUNITY)
Admission: RE | Admit: 2016-12-09 | Discharge: 2016-12-09 | Disposition: A | Discharge status: Home / Self Care | Payer: Medicare Other | Source: Ambulatory Visit | Attending: Internal Medicine | Admitting: Internal Medicine

## 2016-12-09 ENCOUNTER — Other Ambulatory Visit: Payer: Self-pay | Admitting: Internal Medicine

## 2016-12-09 DIAGNOSIS — I480 Paroxysmal atrial fibrillation: Secondary | ICD-10-CM | POA: Diagnosis present

## 2016-12-09 DIAGNOSIS — I4891 Unspecified atrial fibrillation: Secondary | ICD-10-CM | POA: Diagnosis not present

## 2016-12-09 DIAGNOSIS — Z01818 Encounter for other preprocedural examination: Secondary | ICD-10-CM | POA: Diagnosis present

## 2016-12-09 MED ORDER — IOPAMIDOL (ISOVUE-370) INJECTION 76%
100.0000 mL | Freq: Once | INTRAVENOUS | Status: AC | PRN
Start: 2016-12-09 — End: 2016-12-09
  Administered 2016-12-09: 100 mL via INTRAVENOUS

## 2016-12-09 MED ORDER — IOPAMIDOL (ISOVUE-370) INJECTION 76%
INTRAVENOUS | Status: AC
  Filled 2016-12-09: qty 100

## 2016-12-10 ENCOUNTER — Other Ambulatory Visit: Payer: Self-pay | Admitting: Internal Medicine

## 2016-12-15 ENCOUNTER — Encounter (HOSPITAL_COMMUNITY): Payer: Self-pay | Admitting: Anesthesiology

## 2016-12-15 NOTE — Anesthesia Preprocedure Evaluation (Addendum)
Anesthesia Evaluation  Patient identified by MRN, date of birth, ID band Patient awake    Reviewed: Allergy & Precautions, NPO status , Patient's Chart, lab work & pertinent test results  History of Anesthesia Complications (+) PONV and history of anesthetic complications  Airway Mallampati: II  TM Distance: >3 FB Neck ROM: Full    Dental no notable dental hx. (+) Teeth Intact, Caps   Pulmonary COPD,  COPD inhaler, Current Smoker,    Pulmonary exam normal breath sounds clear to auscultation       Cardiovascular Normal cardiovascular exam+ dysrhythmias Atrial Fibrillation  Rhythm:Irregular Rate:Normal  Recurrent Atrial fibrillation   Neuro/Psych  Headaches, PSYCHIATRIC DISORDERS Depression    GI/Hepatic negative GI ROS, Neg liver ROS,   Endo/Other  negative endocrine ROS  Renal/GU negative Renal ROS     Musculoskeletal  (+) Arthritis , Osteoarthritis,  Frozen left shoulder   Abdominal   Peds  Hematology   Anesthesia Other Findings   Reproductive/Obstetrics                            Anesthesia Physical Anesthesia Plan  ASA: III  Anesthesia Plan: MAC   Post-op Pain Management:    Induction:   PONV Risk Score and Plan: 2 and Propofol infusion, Midazolam, Ondansetron and Treatment may vary due to age or medical condition  Airway Management Planned: Natural Airway and Simple Face Mask  Additional Equipment:   Intra-op Plan:   Post-operative Plan:   Informed Consent: I have reviewed the patients History and Physical, chart, labs and discussed the procedure including the risks, benefits and alternatives for the proposed anesthesia with the patient or authorized representative who has indicated his/her understanding and acceptance.   Dental advisory given  Plan Discussed with: CRNA, Anesthesiologist and Surgeon  Anesthesia Plan Comments:        Anesthesia Quick Evaluation

## 2016-12-16 ENCOUNTER — Ambulatory Visit (HOSPITAL_COMMUNITY): Payer: Medicare Other | Admitting: Anesthesiology

## 2016-12-16 ENCOUNTER — Ambulatory Visit (HOSPITAL_COMMUNITY)
Admission: RE | Admit: 2016-12-16 | Discharge: 2016-12-16 | Disposition: A | Discharge status: Home / Self Care | Payer: Medicare Other | Source: Ambulatory Visit | Attending: Internal Medicine | Admitting: Internal Medicine

## 2016-12-16 ENCOUNTER — Other Ambulatory Visit: Payer: Self-pay

## 2016-12-16 ENCOUNTER — Encounter (HOSPITAL_COMMUNITY)
Admission: RE | Disposition: A | Discharge status: Home / Self Care | Payer: Self-pay | Source: Ambulatory Visit | Attending: Internal Medicine

## 2016-12-16 ENCOUNTER — Encounter (HOSPITAL_COMMUNITY): Payer: Self-pay | Admitting: Anesthesiology

## 2016-12-16 DIAGNOSIS — I483 Typical atrial flutter: Secondary | ICD-10-CM | POA: Insufficient documentation

## 2016-12-16 DIAGNOSIS — F329 Major depressive disorder, single episode, unspecified: Secondary | ICD-10-CM | POA: Insufficient documentation

## 2016-12-16 DIAGNOSIS — H409 Unspecified glaucoma: Secondary | ICD-10-CM | POA: Insufficient documentation

## 2016-12-16 DIAGNOSIS — I480 Paroxysmal atrial fibrillation: Secondary | ICD-10-CM | POA: Diagnosis present

## 2016-12-16 DIAGNOSIS — F17210 Nicotine dependence, cigarettes, uncomplicated: Secondary | ICD-10-CM | POA: Diagnosis not present

## 2016-12-16 DIAGNOSIS — G43909 Migraine, unspecified, not intractable, without status migrainosus: Secondary | ICD-10-CM | POA: Insufficient documentation

## 2016-12-16 DIAGNOSIS — M1990 Unspecified osteoarthritis, unspecified site: Secondary | ICD-10-CM | POA: Diagnosis not present

## 2016-12-16 DIAGNOSIS — Z7901 Long term (current) use of anticoagulants: Secondary | ICD-10-CM | POA: Diagnosis not present

## 2016-12-16 DIAGNOSIS — J449 Chronic obstructive pulmonary disease, unspecified: Secondary | ICD-10-CM | POA: Diagnosis not present

## 2016-12-16 HISTORY — DX: Unspecified asthma, uncomplicated: J45.909

## 2016-12-16 HISTORY — DX: Gastro-esophageal reflux disease without esophagitis: K21.9

## 2016-12-16 HISTORY — DX: Pure hypercholesterolemia, unspecified: E78.00

## 2016-12-16 HISTORY — PX: ATRIAL FIBRILLATION ABLATION: EP1191

## 2016-12-16 HISTORY — DX: Anxiety disorder, unspecified: F41.9

## 2016-12-16 LAB — POCT ACTIVATED CLOTTING TIME: Activated Clotting Time: 175 seconds

## 2016-12-16 SURGERY — ATRIAL FIBRILLATION ABLATION
Anesthesia: Monitor Anesthesia Care

## 2016-12-16 MED ORDER — ADENOSINE 6 MG/2ML IV SOLN
INTRAVENOUS | Status: AC
  Filled 2016-12-16: qty 2

## 2016-12-16 MED ORDER — PROPOFOL 500 MG/50ML IV EMUL
INTRAVENOUS | Status: DC | PRN
  Administered 2016-12-16: 50 ug/kg/min via INTRAVENOUS

## 2016-12-16 MED ORDER — HEPARIN SODIUM (PORCINE) 1000 UNIT/ML IJ SOLN
INTRAMUSCULAR | Status: DC | PRN
  Administered 2016-12-16: 12000 [IU] via INTRAVENOUS
  Administered 2016-12-16 (×2): 1000 [IU] via INTRAVENOUS

## 2016-12-16 MED ORDER — ISOPROTERENOL HCL 0.2 MG/ML IJ SOLN
INTRAMUSCULAR | Status: AC
  Filled 2016-12-16: qty 5

## 2016-12-16 MED ORDER — HEPARIN (PORCINE) IN NACL 2-0.9 UNIT/ML-% IJ SOLN
INTRAMUSCULAR | Status: AC | PRN
  Administered 2016-12-16: 500 mL

## 2016-12-16 MED ORDER — BUPROPION HCL ER (XL) 300 MG PO TB24
300.0000 mg | ORAL_TABLET | Freq: Every day | ORAL | Status: DC
Start: 2016-12-17 — End: 2016-12-16

## 2016-12-16 MED ORDER — SODIUM CHLORIDE 0.9% FLUSH: 3.0000 mL | Freq: Two times a day (BID) | INTRAVENOUS | Status: DC

## 2016-12-16 MED ORDER — ONDANSETRON HCL 4 MG/2ML IJ SOLN
INTRAMUSCULAR | Status: DC | PRN
  Administered 2016-12-16: 4 mg via INTRAVENOUS

## 2016-12-16 MED ORDER — SUMATRIPTAN SUCCINATE 100 MG PO TABS
100.0000 mg | ORAL_TABLET | Freq: Once | ORAL | Status: AC
  Administered 2016-12-16: 100 mg via ORAL
  Filled 2016-12-16: qty 1

## 2016-12-16 MED ORDER — IOPAMIDOL (ISOVUE-370) INJECTION 76%
INTRAVENOUS | Status: AC
  Filled 2016-12-16: qty 50

## 2016-12-16 MED ORDER — ONDANSETRON HCL 4 MG/2ML IJ SOLN
4.0000 mg | Freq: Four times a day (QID) | INTRAMUSCULAR | Status: DC | PRN
  Administered 2016-12-16: 4 mg via INTRAVENOUS

## 2016-12-16 MED ORDER — ONDANSETRON HCL 4 MG/2ML IJ SOLN
INTRAMUSCULAR | Status: AC
  Filled 2016-12-16: qty 2

## 2016-12-16 MED ORDER — PANTOPRAZOLE SODIUM 40 MG PO TBEC: 40.0000 mg | DELAYED_RELEASE_TABLET | Freq: Every day | ORAL | 0 refills | Status: DC

## 2016-12-16 MED ORDER — HEPARIN SODIUM (PORCINE) 1000 UNIT/ML IJ SOLN
INTRAMUSCULAR | Status: DC | PRN
  Administered 2016-12-16: 4000 [IU] via INTRAVENOUS
  Administered 2016-12-16: 2000 [IU] via INTRAVENOUS

## 2016-12-16 MED ORDER — BUPIVACAINE HCL (PF) 0.25 % IJ SOLN
INTRAMUSCULAR | Status: DC | PRN
  Administered 2016-12-16: 45 mL

## 2016-12-16 MED ORDER — PROTAMINE SULFATE 10 MG/ML IV SOLN
INTRAVENOUS | Status: DC | PRN
  Administered 2016-12-16: 30 mg via INTRAVENOUS

## 2016-12-16 MED ORDER — SERTRALINE HCL 50 MG PO TABS: 100.0000 mg | ORAL_TABLET | Freq: Every day | ORAL | Status: DC

## 2016-12-16 MED ORDER — APIXABAN 5 MG PO TABS: 5.0000 mg | ORAL_TABLET | Freq: Two times a day (BID) | ORAL | Status: DC

## 2016-12-16 MED ORDER — MIDAZOLAM HCL 5 MG/5ML IJ SOLN
INTRAMUSCULAR | Status: DC | PRN
  Administered 2016-12-16: 1 mg via INTRAVENOUS
  Administered 2016-12-16: 2 mg via INTRAVENOUS
  Administered 2016-12-16: 1 mg via INTRAVENOUS

## 2016-12-16 MED ORDER — LIDOCAINE HCL (CARDIAC) 20 MG/ML IV SOLN
INTRAVENOUS | Status: DC | PRN
  Administered 2016-12-16: 50 mg via INTRAVENOUS
  Administered 2016-12-16: 60 mg via INTRAVENOUS

## 2016-12-16 MED ORDER — ACETAMINOPHEN 325 MG PO TABS: 650.0000 mg | ORAL_TABLET | ORAL | Status: DC | PRN

## 2016-12-16 MED ORDER — DORZOLAMIDE HCL-TIMOLOL MAL 2-0.5 % OP SOLN
1.0000 [drp] | Freq: Two times a day (BID) | OPHTHALMIC | Status: DC
  Filled 2016-12-16: qty 10

## 2016-12-16 MED ORDER — ACETAMINOPHEN 10 MG/ML IV SOLN
1000.0000 mg | INTRAVENOUS | Status: AC
  Administered 2016-12-16: 1000 mg via INTRAVENOUS
  Filled 2016-12-16: qty 100

## 2016-12-16 MED ORDER — BUPIVACAINE HCL (PF) 0.25 % IJ SOLN
INTRAMUSCULAR | Status: AC
  Filled 2016-12-16: qty 30

## 2016-12-16 MED ORDER — IOPAMIDOL (ISOVUE-370) INJECTION 76%
INTRAVENOUS | Status: DC | PRN
  Administered 2016-12-16: 2 mL via INTRAVENOUS

## 2016-12-16 MED ORDER — SODIUM CHLORIDE 0.9% FLUSH: 3.0000 mL | INTRAVENOUS | Status: DC | PRN

## 2016-12-16 MED ORDER — HEPARIN SODIUM (PORCINE) 1000 UNIT/ML IJ SOLN
INTRAMUSCULAR | Status: AC
  Filled 2016-12-16: qty 1

## 2016-12-16 MED ORDER — ISOPROTERENOL HCL 0.2 MG/ML IJ SOLN
INTRAVENOUS | Status: DC | PRN
  Administered 2016-12-16: 20 ug/min via INTRAVENOUS

## 2016-12-16 MED ORDER — ALPRAZOLAM 0.5 MG PO TABS: 0.5000 mg | ORAL_TABLET | Freq: Every evening | ORAL | Status: DC | PRN

## 2016-12-16 MED ORDER — PROPOFOL 10 MG/ML IV BOLUS
INTRAVENOUS | Status: DC | PRN
  Administered 2016-12-16: 20 mg via INTRAVENOUS
  Administered 2016-12-16: 10 mg via INTRAVENOUS
  Administered 2016-12-16 (×3): 20 mg via INTRAVENOUS

## 2016-12-16 MED ORDER — SODIUM CHLORIDE 0.9 % IV SOLN: 250.0000 mL | INTRAVENOUS | Status: DC | PRN

## 2016-12-16 MED ORDER — ADENOSINE 6 MG/2ML IV SOLN
INTRAVENOUS | Status: DC | PRN
  Administered 2016-12-16 (×2): 12 mg via INTRAVENOUS

## 2016-12-16 MED ORDER — SODIUM CHLORIDE 0.9 % IV SOLN
INTRAVENOUS | Status: DC
  Administered 2016-12-16 (×2): via INTRAVENOUS

## 2016-12-16 MED FILL — PANTOPRAZOLE SOD DR 40 MG T: 40 | 45 days supply | Qty: 45 | Fill #0

## 2016-12-16 SURGICAL SUPPLY — 17 items
BLANKET WARM UNDERBOD FULL ACC (MISCELLANEOUS) ×2 IMPLANT
CATH MAPPNG PENTARAY F 2-6-2MM (CATHETERS) IMPLANT
CATH NAVISTAR SMARTTOUCH DF (ABLATOR) ×2 IMPLANT
CATH SOUNDSTAR ECO REPROCESSED (CATHETERS) ×2 IMPLANT
CATH WEBSTER BI DIR CS D-F CRV (CATHETERS) ×2 IMPLANT
NDL TRANSEP BRK 71CM 407200 (NEEDLE) IMPLANT
NEEDLE TRANSEP BRK 71CM 407200 (NEEDLE) ×3 IMPLANT
PACK EP LATEX FREE (CUSTOM PROCEDURE TRAY) ×3
PACK EP LF (CUSTOM PROCEDURE TRAY) ×1 IMPLANT
PAD DEFIB LIFELINK (PAD) ×3 IMPLANT
PATCH CARTO3 (PAD) ×2 IMPLANT
PENTARAY F 2-6-2MM (CATHETERS) ×3
SHEATH AVANTI 11F 11CM (SHEATH) ×2 IMPLANT
SHEATH PINNACLE 7F 10CM (SHEATH) ×4 IMPLANT
SHEATH PINNACLE 9F 10CM (SHEATH) ×2 IMPLANT
SHEATH SWARTZ TS SL2 63CM 8.5F (SHEATH) ×2 IMPLANT
TUBING SMART ABLATE COOLFLOW (TUBING) ×2 IMPLANT

## 2016-12-16 NOTE — Anesthesia Postprocedure Evaluation (Signed)
Anesthesia Post Note  Patient: Suzanne [name]  Procedure(s) Performed: ATRIAL FIBRILLATION ABLATION (N/A )     Patient location during evaluation: PACU Anesthesia Type: MAC Level of consciousness: awake and alert and oriented Pain management: pain level controlled Vital Signs Assessment: post-procedure vital signs reviewed and stable Respiratory status: spontaneous breathing, nonlabored ventilation, respiratory function stable and patient connected to nasal cannula oxygen Cardiovascular status: stable and blood pressure returned to baseline Anesthetic complications: yes Anesthetic complication details: PONV   Last Vitals:  Vitals:   12/16/16 1140 12/16/16 1145  BP: 115/65 126/67  Pulse: 66 70  Resp: 11 10  Temp:    SpO2: 93% 94%    Last Pain:  Vitals:   12/16/16 0554  TempSrc: Oral                 Thurman Sarver A.

## 2016-12-16 NOTE — Discharge Instructions (Signed)
Post procedure care No driving for 4 days. No lifting over 5 lbs for 1 week. No vigorous or sexual activity for 1 week. You may return to work on 12/23/16. Keep procedure site clean & dry. If you notice increased pain, swelling, bleeding or pus, call/return!  You may shower, but no soaking baths/hot tubs/pools for 1 week.     You have an appointment set up with the [facility].  Multiple studies have shown that being followed by a dedicated atrial fibrillation clinic in addition to the standard care you receive from your other physicians improves health. We believe that enrollment in the atrial fibrillation clinic will allow [place] to better care for you.   The phone number to the [facility] is [phone]. The clinic is staffed Monday through Friday from 8:30am to 5pm.  Parking Directions: The clinic is located in the Heart and Vascular Building connected to [facility]. 1)From [address] turn on to [address] and go to the 3rd entrance  (Heart and Vascular entrance) on the right. 2)Look to the right for Heart &Vascular Parking Garage. 3)A code for the entrance is required please call the clinic to receive this.   4)Take the elevators to the 1st floor. Registration is in the room with the glass walls at the end of the hallway.  If you have any trouble parking or locating the clinic, please dont hesitate to call [phone].

## 2016-12-16 NOTE — Progress Notes (Signed)
Site area: rt groin fv sheaths x3 Site Prior to Removal:  Level 0 Pressure Applied For: 25 minutes Manual:   yes Patient Status During Pull:  stable Post Pull Site:  Level 0 Post Pull Instructions Given:  yes Post Pull Pulses Present: palpable Dressing Applied:  Gauze and tegaderm Bedrest begins @ 1150 Comments:  IV saline locked

## 2016-12-16 NOTE — H&P (Signed)
CC: afib  Suzanne [name] is a 66 y.o. female who presents today for routine electrophysiology study and ablation of atrial fibrillation..  Since last being seen in our clinic, the patient reports doing reasonably well.  Today, she denies symptoms of palpitations, chest pain, shortness of breath,  lower extremity edema, dizziness, presyncope, or syncope.  The patient is otherwise without complaint today.       Past Medical History:  Diagnosis Date  . Atrial fibrillation ([place])    s/p ablations  . Atrial flutter ([place])   . COPD (chronic obstructive pulmonary disease) ([place])   . Degenerative arthritis   . Depression   . Frozen shoulder    Left  . Glaucoma   . H/O: rheumatic fever 1957  . History of asthma   . Migraine   . PONV (postoperative nausea and vomiting)    only with Fentanyl and Dilaudid        Past Surgical History:  Procedure Laterality Date  . 2 teeth extraction  2009  . CTI ablation  3/11  . ESOPHAGOGASTRODUODENOSCOPY (EGD) WITH PROPOFOL N/A 01/06/2013   Procedure: ESOPHAGOGASTRODUODENOSCOPY (EGD) WITH PROPOFOL;  Surgeon: Cleotis Nipper, MD;  Location: [facility];  Service: Endoscopy;  Laterality: N/A;  . KNEE SURGERY  [phone]  . LOOP RECORDER INSERTION N/A 10/02/2016   Procedure: LOOP RECORDER INSERTION;  Surgeon: Thompson Grayer, MD;  Location: [facility];  Service: Cardiovascular;  Laterality: N/A;  . pvi  2/11   & 6/11  . RHINOPLASTY  age 40  . TONSILLECTOMY  as child  . TOTAL ABDOMINAL HYSTERECTOMY  1990   right ovary removed    ROS- all systems are reviewed and negatives except as per HPI above        Current Outpatient Prescriptions  Medication Sig Dispense Refill  . acetaminophen (TYLENOL) 325 MG tablet Take 650 mg by mouth every 6 (six) hours as needed (pain).     Marland Kitchen ALPRAZolam (XANAX) 0.5 MG tablet Take 0.5 mg by mouth 2 (two) times daily as needed for anxiety or sleep. Anxiety or sleep    . buPROPion  (WELLBUTRIN XL) 300 MG 24 hr tablet Take 300 mg by mouth daily with breakfast.     . butalbital-acetaminophen-caffeine (FIORICET, ESGIC) 50-325-40 MG tablet TAKE 1 TABLET BY MOUTH EVERY 6 HOURS AS NEEDED FOR HEADACHE OR MIGRAINE 90 tablet 3  . diltiazem (CARDIZEM) 30 MG tablet Cardizem 30mg  -- take 1 tablet by mouth every 4 hours AS NEEDED for heart rate >100 as long as blood pressure >100. 45 tablet 3  . dorzolamide-timolol (COSOPT) 22.3-6.8 MG/ML ophthalmic solution Place 1 drop into both eyes 2 (two) times daily. 1 drop each eye , am and pm  3  . ELIQUIS 5 MG TABS tablet TAKE 1 TABLET BY MOUTH TWICE DAILY 180 tablet 3  . OnabotulinumtoxinA (BOTOX IJ) Inject as directed every 3 (three) months.    . ondansetron (ZOFRAN ODT) 4 MG disintegrating tablet Take 1 tablet (4 mg total) by mouth every 8 (eight) hours as needed for nausea or vomiting. 20 tablet 0  . promethazine (PHENERGAN) 25 MG tablet Take 1 tablet (25 mg total) by mouth every 6 (six) hours as needed for nausea or vomiting. 15 tablet 2  . sertraline (ZOLOFT) 100 MG tablet Take 100 mg by mouth daily with breakfast.     . SUMAtriptan (IMITREX) 100 MG tablet TAKE 1 TABLET BY MOUTH AT THE ONSET OF A HEADACHE DAILY AS NEEDED 27 tablet 3  No current facility-administered medications for this visit.     Physical Exam: Vitals:   12/16/16 0554  BP: 125/77  Pulse: 66  Resp: 18  Temp: (!) 97.5 F (36.4 C)  SpO2: 97%   GEN- The patient is well appearing, alert and oriented x 3 today.   Head- normocephalic, atraumatic Eyes-  Sclera clear, conjunctiva pink Ears- hearing intact Oropharynx- clear Lungs- Clear to ausculation bilaterally, normal work of breathing Heart- Regular rate and rhythm, no murmurs, rubs or gallops, PMI not laterally displaced GI- soft, NT, ND, + BS Extremities- no clubbing, cyanosis, or edema   Assessment and Plan:  1. Afib/ atrial flutter Given documented recurrence of afib,  I would advise repeat  ablation.  She has not tolerated AADs in the past. Therapeutic strategies for afib including medicine and ablation were discussed in detail with the patient today. Risk, benefits, and alternatives to EP study and radiofrequency ablation for afib were also discussed in detail today. These risks include but are not limited to stroke, bleeding, vascular damage, tamponade, perforation, damage to the esophagus, lungs, and other structures, pulmonary vein stenosis, worsening renal function, and death. The patient understands these risk and wishes to proceed.  She reports compliance with eliquis without interruption.  Cardiac CT is reviewed and reveals no LAA thrombus.  Thompson Grayer MD, [facility] 12/16/2016 7:11 AM

## 2016-12-16 NOTE — Transfer of Care (Signed)
Immediate Anesthesia Transfer of Care Note  Patient: Suzanne [name]  Procedure(s) Performed: ATRIAL FIBRILLATION ABLATION (N/A )  Patient Location: PACU and Cath Lab  Anesthesia Type:MAC  Level of Consciousness: awake, alert , oriented and patient cooperative  Airway & Oxygen Therapy: Patient Spontanous Breathing and Patient connected to nasal cannula oxygen  Post-op Assessment: Report given to RN, Post -op Vital signs reviewed and stable and Patient moving all extremities  Post vital signs: Reviewed and stable  Last Vitals:  Vitals:   12/16/16 0554  BP: 125/77  Pulse: 66  Resp: 18  Temp: (!) 36.4 C  SpO2: 97%    Last Pain:  Vitals:   12/16/16 0554  TempSrc: Oral      Patients Stated Pain Goal: 3 (00/05/05 6788)  Complications: No apparent anesthesia complications

## 2016-12-16 NOTE — Progress Notes (Signed)
Doing well s/p ablation No concerns Vitals stable Groin without hematoma/ bruit   DC to home Resume home medicines Routine groin care and follow-up  Thompson Grayer MD, [facility] 12/16/2016 4:57 PM

## 2016-12-17 LAB — POCT ACTIVATED CLOTTING TIME
ACTIVATED CLOTTING TIME: 279 s
ACTIVATED CLOTTING TIME: 312 s

## 2016-12-22 MED FILL — CLOBETASOL 0.05% OINTMENT: 0.05 | 20 days supply | Qty: 60 | Fill #0

## 2016-12-31 ENCOUNTER — Encounter: Payer: Self-pay | Admitting: Neurology

## 2016-12-31 ENCOUNTER — Ambulatory Visit: Payer: Medicare Other | Admitting: Neurology

## 2016-12-31 ENCOUNTER — Ambulatory Visit (INDEPENDENT_AMBULATORY_CARE_PROVIDER_SITE_OTHER): Payer: Medicare Other | Admitting: *Deleted

## 2016-12-31 VITALS — BP 123/60 | HR 65 | Ht 67.0 in | Wt 156.0 lb

## 2016-12-31 DIAGNOSIS — G43719 Chronic migraine without aura, intractable, without status migrainosus: Secondary | ICD-10-CM

## 2016-12-31 DIAGNOSIS — I480 Paroxysmal atrial fibrillation: Secondary | ICD-10-CM

## 2016-12-31 NOTE — Progress Notes (Signed)
Please refer to Botox procedure note. 

## 2016-12-31 NOTE — Procedures (Signed)
     BOTOX PROCEDURE NOTE FOR MIGRAINE HEADACHE   HISTORY: Suzanne [name] is a 66 year old patient with a history of intractable migraine headache.  The patient is clearly gained benefit with Botox.  Her headaches are less frequent and less severe when they do come on, she is not having to take as much Imitrex or Fioricet for the headaches.  The patient returns for another Botox injection.   Description of procedure:  The patient was placed in a sitting position. The standard protocol was used for Botox as follows, with 5 units of Botox injected at each site:   -Procerus muscle, midline injection  -Corrugator muscle, bilateral injection  -Frontalis muscle, bilateral injection, with 2 sites each side, medial injection was performed in the upper one third of the frontalis muscle, in the region vertical from the medial inferior edge of the superior orbital rim. The lateral injection was again in the upper one third of the forehead vertically above the lateral limbus of the cornea, 1.5 cm lateral to the medial injection site.  -Temporalis muscle injection, 4 sites, bilaterally. The first injection was 3 cm above the tragus of the ear, second injection site was 1.5 cm to 3 cm up from the first injection site in line with the tragus of the ear. The third injection site was 1.5-3 cm forward between the first 2 injection sites. The fourth injection site was 1.5 cm posterior to the second injection site.  -Occipitalis muscle injection, 3 sites, bilaterally. The first injection was done one half way between the occipital protuberance and the tip of the mastoid process behind the ear. The second injection site was done lateral and superior to the first, 1 fingerbreadth from the first injection. The third injection site was 1 fingerbreadth superiorly and medially from the first injection site.  -Cervical paraspinal muscle injection, 2 sites, bilateral, the first injection site was 1 cm from the midline  of the cervical spine, 3 cm inferior to the lower border of the occipital protuberance. The second injection site was 1.5 cm superiorly and laterally to the first injection site.  -Trapezius muscle injection was performed at 3 sites, bilaterally. The first injection site was in the upper trapezius muscle halfway between the inflection point of the neck, and the acromion. The second injection site was one half way between the acromion and the first injection site. The third injection was done between the first injection site and the inflection point of the neck.   A 200 unit bottle of Botox was used, 155 units were injected, the rest of the Botox was wasted. The patient tolerated the procedure well, there were no complications of the above procedure.  Botox NDC 8502-7741-28 Lot number [id number] Expiration date April 2021

## 2017-01-01 MED FILL — BUPROPION HCL XL 300 MG TAB: 300 | 90 days supply | Qty: 90 | Fill #1

## 2017-01-01 MED FILL — SERTRALINE HCL 100 MG TAB: 100 | 90 days supply | Qty: 90 | Fill #0

## 2017-01-01 MED FILL — ELIQUIS 5 MG TABLET: 5 | 90 days supply | Qty: 180 | Fill #2

## 2017-01-01 NOTE — Progress Notes (Signed)
Carelink Summary Report / Loop Recorder 

## 2017-01-14 ENCOUNTER — Ambulatory Visit (HOSPITAL_COMMUNITY)
Admission: RE | Admit: 2017-01-14 | Discharge: 2017-01-14 | Disposition: A | Discharge status: Home / Self Care | Payer: Medicare Other | Source: Ambulatory Visit | Attending: Nurse Practitioner | Admitting: Nurse Practitioner

## 2017-01-14 ENCOUNTER — Ambulatory Visit (HOSPITAL_COMMUNITY): Payer: Medicare Other | Admitting: Nurse Practitioner

## 2017-01-14 ENCOUNTER — Encounter (HOSPITAL_COMMUNITY): Payer: Self-pay | Admitting: Nurse Practitioner

## 2017-01-14 VITALS — BP 124/72 | HR 67 | Ht 67.0 in | Wt 154.8 lb

## 2017-01-14 DIAGNOSIS — Z8051 Family history of malignant neoplasm of kidney: Secondary | ICD-10-CM | POA: Insufficient documentation

## 2017-01-14 DIAGNOSIS — Z7902 Long term (current) use of antithrombotics/antiplatelets: Secondary | ICD-10-CM | POA: Diagnosis not present

## 2017-01-14 DIAGNOSIS — K219 Gastro-esophageal reflux disease without esophagitis: Secondary | ICD-10-CM | POA: Insufficient documentation

## 2017-01-14 DIAGNOSIS — Z881 Allergy status to other antibiotic agents status: Secondary | ICD-10-CM | POA: Insufficient documentation

## 2017-01-14 DIAGNOSIS — I480 Paroxysmal atrial fibrillation: Secondary | ICD-10-CM

## 2017-01-14 DIAGNOSIS — J449 Chronic obstructive pulmonary disease, unspecified: Secondary | ICD-10-CM | POA: Insufficient documentation

## 2017-01-14 DIAGNOSIS — G43909 Migraine, unspecified, not intractable, without status migrainosus: Secondary | ICD-10-CM | POA: Insufficient documentation

## 2017-01-14 DIAGNOSIS — I4891 Unspecified atrial fibrillation: Secondary | ICD-10-CM | POA: Diagnosis present

## 2017-01-14 DIAGNOSIS — H409 Unspecified glaucoma: Secondary | ICD-10-CM | POA: Diagnosis not present

## 2017-01-14 DIAGNOSIS — Z90710 Acquired absence of both cervix and uterus: Secondary | ICD-10-CM | POA: Diagnosis not present

## 2017-01-14 DIAGNOSIS — Z79899 Other long term (current) drug therapy: Secondary | ICD-10-CM | POA: Insufficient documentation

## 2017-01-14 DIAGNOSIS — Z885 Allergy status to narcotic agent status: Secondary | ICD-10-CM | POA: Insufficient documentation

## 2017-01-14 DIAGNOSIS — Z8249 Family history of ischemic heart disease and other diseases of the circulatory system: Secondary | ICD-10-CM | POA: Insufficient documentation

## 2017-01-14 DIAGNOSIS — I4892 Unspecified atrial flutter: Secondary | ICD-10-CM | POA: Insufficient documentation

## 2017-01-14 DIAGNOSIS — Z9889 Other specified postprocedural states: Secondary | ICD-10-CM | POA: Diagnosis not present

## 2017-01-14 DIAGNOSIS — E7800 Pure hypercholesterolemia, unspecified: Secondary | ICD-10-CM | POA: Diagnosis not present

## 2017-01-14 DIAGNOSIS — F17210 Nicotine dependence, cigarettes, uncomplicated: Secondary | ICD-10-CM | POA: Insufficient documentation

## 2017-01-14 DIAGNOSIS — Z820 Family history of epilepsy and other diseases of the nervous system: Secondary | ICD-10-CM | POA: Insufficient documentation

## 2017-01-14 NOTE — Progress Notes (Signed)
Primary Care Physician: Hulan Fess, MD Referring Physician: Dr. Marcial Pacas Henricks is a 66 y.o. female with a h/o afib s/p ablations that recently had her 4th ablation for increased afib burden. She went home the same day of ablation "touch up". She had some bleeding at site, but it was under control by time of d/c.No further issues with groin or swallowing. No afib that she has noted.   Today, she denies symptoms of palpitations, chest pain, shortness of breath, orthopnea, PND, lower extremity edema, dizziness, presyncope, syncope, or neurologic sequela. The patient is tolerating medications without difficulties and is otherwise without complaint today.   Past Medical History:  Diagnosis Date  . Anxiety   . Asthma   . Atrial fibrillation ([place])    s/p ablations  . Atrial flutter ([place])   . COPD (chronic obstructive pulmonary disease) ([place])   . Degenerative arthritis    "maybe in my left knee" (12/16/2016)  . Depression   . Frozen shoulder    hx bilaterally; "had to go thru PT; no problem w/them since I learned exercises" (12/16/2016)  . GERD (gastroesophageal reflux disease)   . Glaucoma   . H/O: rheumatic fever 1957  . High cholesterol   . Migraine    "getting botox now; now only 2-3/month" (12/16/2016)  . PONV (postoperative nausea and vomiting)    with Fentanyl,  Dilaudid &  Propofol (12/16/2016)   Past Surgical History:  Procedure Laterality Date  . ABDOMINAL HYSTERECTOMY  1990   right ovary removed  . APPENDECTOMY  1990  . ATRIAL FIBRILLATION ABLATION N/A 12/16/2016   Procedure: ATRIAL FIBRILLATION ABLATION;  Surgeon: Thompson Grayer, MD;  Location: [facility];  Service: Cardiovascular;  Laterality: N/A;  . CTI ablation  3/11  . ESOPHAGOGASTRODUODENOSCOPY (EGD) WITH PROPOFOL N/A 01/06/2013   Procedure: ESOPHAGOGASTRODUODENOSCOPY (EGD) WITH PROPOFOL;  Surgeon: Cleotis Nipper, MD;  [facility]: [facility];  Service: Endoscopy;  Laterality: N/A;  . KNEE  ARTHROSCOPY Bilateral 1999-2006  . KNEE SURGERY Bilateral [id number]   "open; for congenital deformity"  . LOOP RECORDER INSERTION N/A 10/02/2016   Procedure: LOOP RECORDER INSERTION;  Surgeon: Thompson Grayer, MD;  Location: [facility];  Service: Cardiovascular;  Laterality: N/A;  . PERIPHERAL VASCULAR INTERVENTION  2/11  & 6/11  . RHINOPLASTY  1965  . TONSILLECTOMY  as child  . TOOTH EXTRACTION  2009   2 teeth extractted    Current Outpatient Medications  Medication Sig Dispense Refill  . acetaminophen (TYLENOL) 325 MG tablet Take 325 mg 2 (two) times daily as needed by mouth (pain).     Marland Kitchen ALPRAZolam (XANAX) 0.5 MG tablet Take 0.5 mg at bedtime as needed by mouth for anxiety or sleep. Anxiety or sleep    . buPROPion (WELLBUTRIN XL) 300 MG 24 hr tablet Take 300 mg daily by mouth.     . butalbital-acetaminophen-caffeine (FIORICET, ESGIC) 50-325-40 MG tablet TAKE 1 TABLET BY MOUTH EVERY 6 HOURS AS NEEDED FOR HEADACHE OR MIGRAINE 90 tablet 3  . diltiazem (CARDIZEM) 30 MG tablet Cardizem 30mg  -- take 1 tablet by mouth every 4 hours AS NEEDED for heart rate >100 as long as blood pressure >100. (Patient taking differently: Cardizem 30mg  -- take 1 tablet by mouth every 4 hours AS NEEDED for heart rate >120 as long as blood pressure >100.) 45 tablet 3  . dorzolamide-timolol (COSOPT) 22.3-6.8 MG/ML ophthalmic solution Place 1 drop 2 (two) times daily into both eyes.   3  .  ELIQUIS 5 MG TABS tablet TAKE 1 TABLET BY MOUTH TWICE DAILY 180 tablet 3  . Hypromellose (ARTIFICIAL TEARS OP) Apply 1 drop daily as needed to eye (dry eyes).    . OnabotulinumtoxinA (BOTOX IJ) Inject as directed every 3 (three) months.    . pantoprazole (PROTONIX) 40 MG tablet Take 1 tablet (40 mg total) daily by mouth. 45 tablet 0  . sertraline (ZOLOFT) 100 MG tablet Take 100 mg by mouth daily with breakfast.     . Simethicone (GAS-X PO) Take 2 tablets daily as needed by mouth (gas).    . sodium chloride (OCEAN) 0.65 % SOLN  nasal spray Place 1 spray as needed into both nostrils for congestion.    . SUMAtriptan (IMITREX) 100 MG tablet TAKE 1 TABLET BY MOUTH AT THE ONSET OF A HEADACHE DAILY AS NEEDED (Patient taking differently: TAKE 1 TABLET BY MOUTH AT THE ONSET OF A HEADACHE DAILY AS NEEDED FOR MIGRAINES) 27 tablet 3  . ondansetron (ZOFRAN ODT) 4 MG disintegrating tablet Take 1 tablet (4 mg total) by mouth every 8 (eight) hours as needed for nausea or vomiting. (Patient not taking: Reported on 01/14/2017) 20 tablet 0  . promethazine (PHENERGAN) 25 MG tablet Take 1 tablet (25 mg total) by mouth every 6 (six) hours as needed for nausea or vomiting. (Patient not taking: Reported on 01/14/2017) 15 tablet 2   No current facility-administered medications for this encounter.     Allergies  Allergen Reactions  . Dilaudid [Hydromorphone Hcl] Nausea And Vomiting  . Fentanyl Nausea And Vomiting  . Ciprofloxacin Rash  . Erythromycin Rash  . Percocet [Oxycodone-Acetaminophen] Nausea And Vomiting    Social History   Socioeconomic History  . Marital status: Married    Spouse name: Not on file  . Number of children: 0  . Years of education: 41  . Highest education level: Not on file  Social Needs  . Financial resource strain: Not on file  . Food insecurity - worry: Not on file  . Food insecurity - inability: Not on file  . Transportation needs - medical: Not on file  . Transportation needs - non-medical: Not on file  Occupational History  . 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 Occupation: CLINICAL NURSE CARE    Employer:   Tobacco Use  . Smoking status: Current Every Day Smoker    Packs/day: 0.50    Years: 50.00    Pack years: 25.00  . Smokeless tobacco: Never Used  Substance and Sexual Activity  . Alcohol use: Yes    Comment: 12/16/2016 "1-2 glasses of wine per year"  . Drug use: No  . Sexual activity: Not Currently  Other Topics Concern  . Not on file   Social History Narrative   Lives at home w/ husband and 2 dogs   Patient is right handed.   Patient drinks one cup caffeine.    Family History  Problem Relation Age of Onset  . Cancer Mother        renal cell  . Migraines Mother   . Dementia Father   . Heart disease Father     ROS- All systems are reviewed and negative except as per the HPI above  Physical Exam: Vitals:   01/14/17 0926  BP: 124/72  Pulse: 67  Weight: 154 lb 12.8 oz (70.2 kg)  Height: 5\' 7"  (1.702 m)   Wt Readings from Last 3 Encounters:  01/14/17 154  lb 12.8 oz (70.2 kg)  12/31/16 156 lb (70.8 kg)  12/16/16 153 lb (69.4 kg)    Labs: Lab Results  Component Value Date   NA 141 12/05/2016   K 3.9 12/05/2016   CL 105 12/05/2016   CO2 24 12/05/2016   GLUCOSE 98 12/05/2016   BUN 11 12/05/2016   CREATININE 0.66 12/05/2016   CALCIUM 9.1 12/05/2016   Lab Results  Component Value Date   INR 2.3 09/21/2009   No results found for: CHOL, HDL, LDLCALC, TRIG   GEN- The patient is well appearing, alert and oriented x 3 today.   Head- normocephalic, atraumatic Eyes-  Sclera clear, conjunctiva pink Ears- hearing intact Oropharynx- clear Neck- supple, no JVP Lymph- no cervical lymphadenopathy Lungs- Clear to ausculation bilaterally, normal work of breathing Heart- regular rate and rhythm, no murmurs, rubs or gallops, PMI not laterally displaced GI- soft, NT, ND, + BS Extremities- no clubbing, cyanosis, or edema MS- no significant deformity or atrophy Skin- no rash or lesion Psych- euthymic mood, full affect Neuro- strength and sensation are intact  EKG- NSR at 67 bpm, pr int 148 ms, qrs int 72 ms, qtc 414 ms Epic records reviewed    Assessment and Plan: 1. Afib S/p ablation No afib, maintaining SR since procedure Continue on eliquis 5 mg bid for a chadsvasc score of at least 2, do not stop anticoagulation for the 3 month healing phase.  2. Linq Remote check 12/28 Per Dr. Rayann Heman   F/u  Dr. Rayann Heman 2/6 afib clinic as needed  Geroge Baseman. Tyona Nilsen, [facility] [address] [place], [place] [address] [phone]

## 2017-01-15 LAB — CUP PACEART REMOTE DEVICE CHECK
Implantable Pulse Generator Implant Date: 20180830
MDC IDC SESS DTM: 20181128203846

## 2017-01-30 ENCOUNTER — Ambulatory Visit (INDEPENDENT_AMBULATORY_CARE_PROVIDER_SITE_OTHER): Payer: Medicare Other | Admitting: *Deleted

## 2017-01-30 DIAGNOSIS — I480 Paroxysmal atrial fibrillation: Secondary | ICD-10-CM | POA: Diagnosis not present

## 2017-02-02 NOTE — Progress Notes (Signed)
Carelink Summary Report / Loop Recorder 

## 2017-02-09 LAB — CUP PACEART REMOTE DEVICE CHECK
Date Time Interrogation Session: 20181228223856
MDC IDC PG IMPLANT DT: 20180830

## 2017-02-23 ENCOUNTER — Encounter: Payer: Self-pay | Admitting: *Deleted

## 2017-03-02 ENCOUNTER — Ambulatory Visit (INDEPENDENT_AMBULATORY_CARE_PROVIDER_SITE_OTHER): Payer: Medicare Other | Admitting: *Deleted

## 2017-03-02 DIAGNOSIS — I480 Paroxysmal atrial fibrillation: Secondary | ICD-10-CM | POA: Diagnosis not present

## 2017-03-02 NOTE — Progress Notes (Signed)
Carelink Summary Report / Loop Recorder 

## 2017-03-05 ENCOUNTER — Other Ambulatory Visit: Payer: Self-pay | Admitting: Neurology

## 2017-03-05 MED FILL — DORZOLAMIDE-TIMOLOL EYE DRP: 22.3-6.8 | 50 days supply | Qty: 10 | Fill #0

## 2017-03-06 MED FILL — BUTALB-ACETAMIN-CAFF 50-325: 50-325-40 | 22 days supply | Qty: 90 | Fill #0

## 2017-03-06 MED FILL — SUMATRIPTAN SUCC 100 MG TAB: 100 | 90 days supply | Qty: 27 | Fill #0

## 2017-03-06 NOTE — Telephone Encounter (Signed)
Faxed printed/signed rx Fioricet to [facility] at [phone]. Received fax confirmation.

## 2017-03-11 ENCOUNTER — Encounter: Payer: Self-pay | Admitting: Internal Medicine

## 2017-03-11 ENCOUNTER — Ambulatory Visit: Payer: Medicare Other | Admitting: Internal Medicine

## 2017-03-11 VITALS — BP 100/70 | HR 62 | Ht 67.0 in | Wt 152.0 lb

## 2017-03-11 DIAGNOSIS — I495 Sick sinus syndrome: Secondary | ICD-10-CM | POA: Diagnosis not present

## 2017-03-11 DIAGNOSIS — I4892 Unspecified atrial flutter: Secondary | ICD-10-CM | POA: Diagnosis not present

## 2017-03-11 DIAGNOSIS — G4733 Obstructive sleep apnea (adult) (pediatric): Secondary | ICD-10-CM | POA: Diagnosis not present

## 2017-03-11 DIAGNOSIS — I480 Paroxysmal atrial fibrillation: Secondary | ICD-10-CM | POA: Diagnosis not present

## 2017-03-11 LAB — CUP PACEART INCLINIC DEVICE CHECK
Date Time Interrogation Session: 20190206102743
MDC IDC PG IMPLANT DT: 20180830

## 2017-03-11 NOTE — Patient Instructions (Addendum)
Medication Instructions:  Your physician recommends that you continue on your current medications as directed. Please refer to the Current Medication list given to you today.   Labwork: None ordered  Testing/Procedures: None ordered  Follow-Up: When you return from your vacation, you will need to send a manual transmission.  Your physician recommends that you schedule a follow-up appointment in: 3 months with Dr. Rayann Heman    Any Other Special Instructions Will Be Listed Below (If Applicable).     If you need a refill on your cardiac medications before your next appointment, please call your pharmacy.

## 2017-03-11 NOTE — Progress Notes (Signed)
PCP: Hulan Fess, MD Primary Cardiologist: Dr Gwenith Spitz is a 67 y.o. female who presents today for routine electrophysiology followup.  Since his recent afib ablation, the patient reports doing very well.  she denies procedure related complications and is pleased with the results of the procedure.  Today, she denies symptoms of palpitations, chest pain, shortness of breath,  lower extremity edema, dizziness, presyncope, or syncope.  She is planning a 2 week trip to [place] starting next week.  The patient is otherwise without complaint today.   Past Medical History:  Diagnosis Date  . Anxiety   . Asthma   . Atrial fibrillation ([place])    s/p ablations  . Atrial flutter ([place])   . COPD (chronic obstructive pulmonary disease) ([place])   . Degenerative arthritis    "maybe in my left knee" (12/16/2016)  . Depression   . Frozen shoulder    hx bilaterally; "had to go thru PT; no problem w/them since I learned exercises" (12/16/2016)  . GERD (gastroesophageal reflux disease)   . Glaucoma   . H/O: rheumatic fever 1957  . High cholesterol   . Migraine    "getting botox now; now only 2-3/month" (12/16/2016)  . PONV (postoperative nausea and vomiting)    with Fentanyl,  Dilaudid &  Propofol (12/16/2016)   Past Surgical History:  Procedure Laterality Date  . ABDOMINAL HYSTERECTOMY  1990   right ovary removed  . APPENDECTOMY  1990  . ATRIAL FIBRILLATION ABLATION N/A 12/16/2016   Procedure: ATRIAL FIBRILLATION ABLATION;  Surgeon: Thompson Grayer, MD;  Location: [facility];  Service: Cardiovascular;  Laterality: N/A;  . CTI ablation  3/11  . ESOPHAGOGASTRODUODENOSCOPY (EGD) WITH PROPOFOL N/A 01/06/2013   Procedure: ESOPHAGOGASTRODUODENOSCOPY (EGD) WITH PROPOFOL;  Surgeon: Cleotis Nipper, MD;  [facility];  Service: Endoscopy;  Laterality: N/A;  . KNEE ARTHROSCOPY Bilateral 1999-2006  . KNEE SURGERY Bilateral [id number]   "open; for congenital deformity"  . LOOP  RECORDER INSERTION N/A 10/02/2016   Procedure: LOOP RECORDER INSERTION;  Surgeon: Thompson Grayer, MD;  Location: [facility];  Service: Cardiovascular;  Laterality: N/A;  . PERIPHERAL VASCULAR INTERVENTION  2/11  & 6/11  . RHINOPLASTY  1965  . TONSILLECTOMY  as child  . TOOTH EXTRACTION  2009   2 teeth extractted    ROS- all systems are personally reviewed and negatives except as per HPI above  Current Outpatient Medications  Medication Sig Dispense Refill  . acetaminophen (TYLENOL) 325 MG tablet Take 325 mg 2 (two) times daily as needed by mouth (pain).     Marland Kitchen ALPRAZolam (XANAX) 0.5 MG tablet Take 0.5 mg at bedtime as needed by mouth for anxiety or sleep. Anxiety or sleep    . buPROPion (WELLBUTRIN XL) 300 MG 24 hr tablet Take 300 mg daily by mouth.     . butalbital-acetaminophen-caffeine (FIORICET, ESGIC) 50-325-40 MG tablet TAKE 1 TABLET BY MOUTH EVERY 6 HOURS AS NEEDED FOR HEADACHE OR MIGRAINE 90 tablet 3  . diltiazem (CARDIZEM) 30 MG tablet Cardizem 30mg  -- take 1 tablet by mouth every 4 hours AS NEEDED for heart rate >100 as long as blood pressure >100. (Patient taking differently: Cardizem 30mg  -- take 1 tablet by mouth every 4 hours AS NEEDED for heart rate >120 as long as blood pressure >100.) 45 tablet 3  . dorzolamide-timolol (COSOPT) 22.3-6.8 MG/ML ophthalmic solution Place 1 drop 2 (two) times daily into both eyes.   3  . ELIQUIS 5 MG  TABS tablet TAKE 1 TABLET BY MOUTH TWICE DAILY 180 tablet 3  . Hypromellose (ARTIFICIAL TEARS OP) Apply 1 drop daily as needed to eye (dry eyes).    . OnabotulinumtoxinA (BOTOX IJ) Inject as directed every 3 (three) months.    . ondansetron (ZOFRAN ODT) 4 MG disintegrating tablet Take 1 tablet (4 mg total) by mouth every 8 (eight) hours as needed for nausea or vomiting. 20 tablet 0  . promethazine (PHENERGAN) 25 MG tablet Take 1 tablet (25 mg total) by mouth every 6 (six) hours as needed for nausea or vomiting. 15 tablet 2  . sertraline (ZOLOFT)  100 MG tablet Take 100 mg by mouth daily with breakfast.     . Simethicone (GAS-X PO) Take 2 tablets daily as needed by mouth (gas).    . sodium chloride (OCEAN) 0.65 % SOLN nasal spray Place 1 spray as needed into both nostrils for congestion.    . SUMAtriptan (IMITREX) 100 MG tablet TAKE 1 TABLET BY MOUTH AT THE ONSET OF A HEADACHE DAILY AS NEEDED 27 tablet 3  . pantoprazole (PROTONIX) 40 MG tablet Take 1 tablet (40 mg total) daily by mouth. 45 tablet 0   No current facility-administered medications for this visit.     Physical Exam: Vitals:   03/11/17 0935  Height: 5\' 7"  (1.702 m)    GEN- The patient is well appearing, alert and oriented x 3 today.   Head- normocephalic, atraumatic Eyes-  Sclera clear, conjunctiva pink Ears- hearing intact Oropharynx- clear Lungs- Clear to ausculation bilaterally, normal work of breathing Heart- Regular rate and rhythm, no murmurs, rubs or gallops, PMI not laterally displaced GI- soft, NT, ND, + BS Extremities- no clubbing, cyanosis, or edema  EKG tracing ordered today is personally reviewed and shows sinus rhythm 62 bpm, PACs  Assessment and Plan:  1. Paroxysmal atrial fibrillation and atrial flutter Doing well s/p ablation off AAD therapy chads2vasc score is 2 Continue on eliquis ILR reveals no afib since November!  2. OSA Evaluated by Dr Rulon Sera, Very please with results of CPAP Migraines have resolved!  3. Sick sinus syndrome Pauses resolved with sinus rhythm  Return to see me in 3 months  Thompson Grayer MD, [facility] 03/11/2017 9:44 AM

## 2017-03-12 LAB — CUP PACEART REMOTE DEVICE CHECK
Date Time Interrogation Session: 20190127230844
MDC IDC PG IMPLANT DT: 20180830

## 2017-04-02 MED FILL — SERTRALINE HCL 100 MG TAB: 100 | 90 days supply | Qty: 90 | Fill #1

## 2017-04-02 MED FILL — ELIQUIS 5 MG TABLET: 5 | 90 days supply | Qty: 180 | Fill #3

## 2017-04-02 MED FILL — BUPROPION HCL XL 300 MG TAB: 300 | 90 days supply | Qty: 90 | Fill #0

## 2017-04-03 ENCOUNTER — Ambulatory Visit (INDEPENDENT_AMBULATORY_CARE_PROVIDER_SITE_OTHER): Payer: Medicare Other | Admitting: *Deleted

## 2017-04-03 DIAGNOSIS — I480 Paroxysmal atrial fibrillation: Secondary | ICD-10-CM

## 2017-04-06 NOTE — Progress Notes (Signed)
Carelink Summary Report / Loop Recorder 

## 2017-04-08 ENCOUNTER — Encounter: Payer: Self-pay | Admitting: *Deleted

## 2017-04-08 ENCOUNTER — Other Ambulatory Visit: Payer: Self-pay | Admitting: Obstetrics and Gynecology

## 2017-04-08 DIAGNOSIS — Z1231 Encounter for screening mammogram for malignant neoplasm of breast: Secondary | ICD-10-CM

## 2017-04-08 NOTE — Progress Notes (Signed)
Received fax notification from [facility] that Botox 200unit approved until 07/09/17 under her Medicare Part D benefit. [id number]

## 2017-04-09 ENCOUNTER — Encounter: Payer: Self-pay | Admitting: Neurology

## 2017-04-09 ENCOUNTER — Ambulatory Visit (INDEPENDENT_AMBULATORY_CARE_PROVIDER_SITE_OTHER): Payer: Medicare Other | Admitting: Neurology

## 2017-04-09 VITALS — BP 120/56 | HR 64

## 2017-04-09 DIAGNOSIS — G43719 Chronic migraine without aura, intractable, without status migrainosus: Secondary | ICD-10-CM

## 2017-04-09 MED ORDER — ONABOTULINUMTOXINA 100 UNITS IJ SOLR
200.0000 [IU] | Freq: Once | INTRAMUSCULAR | Status: AC
  Administered 2017-04-09: 200 [IU] via INTRAMUSCULAR

## 2017-04-09 NOTE — Progress Notes (Signed)
Please refer to the Botox procedure note.

## 2017-04-09 NOTE — Procedures (Signed)
     BOTOX PROCEDURE NOTE FOR MIGRAINE HEADACHE   HISTORY: Suzanne [name] is a 66 year old patient with a history of intractable migraine headaches.  The patient has clearly gained good benefit with Botox.  Within a week or 2 prior to the next therapy her headaches return.  The patient function quite well for 2-1/2 months following each injection.  She returns for another treatment.   Description of procedure:  The patient was placed in a sitting position. The standard protocol was used for Botox as follows, with 5 units of Botox injected at each site:   -Procerus muscle, midline injection  -Corrugator muscle, bilateral injection  -Frontalis muscle, bilateral injection, with 2 sites each side, medial injection was performed in the upper one third of the frontalis muscle, in the region vertical from the medial inferior edge of the superior orbital rim. The lateral injection was again in the upper one third of the forehead vertically above the lateral limbus of the cornea, 1.5 cm lateral to the medial injection site.  -Temporalis muscle injection, 4 sites, bilaterally. The first injection was 3 cm above the tragus of the ear, second injection site was 1.5 cm to 3 cm up from the first injection site in line with the tragus of the ear. The third injection site was 1.5-3 cm forward between the first 2 injection sites. The fourth injection site was 1.5 cm posterior to the second injection site.  -Occipitalis muscle injection, 3 sites, bilaterally. The first injection was done one half way between the occipital protuberance and the tip of the mastoid process behind the ear. The second injection site was done lateral and superior to the first, 1 fingerbreadth from the first injection. The third injection site was 1 fingerbreadth superiorly and medially from the first injection site.  -Cervical paraspinal muscle injection, 2 sites, bilateral, the first injection site was 1 cm from the midline of the  cervical spine, 3 cm inferior to the lower border of the occipital protuberance. The second injection site was 1.5 cm superiorly and laterally to the first injection site.  -Trapezius muscle injection was performed at 3 sites, bilaterally. The first injection site was in the upper trapezius muscle halfway between the inflection point of the neck, and the acromion. The second injection site was one half way between the acromion and the first injection site. The third injection was done between the first injection site and the inflection point of the neck.   A 200 unit bottle of Botox was used, 155 units were injected, the rest of the Botox was wasted. The patient tolerated the procedure well, there were no complications of the above procedure.  Botox NDC 6789-3810-17 Lot number [id number] Expiration date August 2021

## 2017-04-28 ENCOUNTER — Ambulatory Visit: Payer: Medicare Other

## 2017-05-06 ENCOUNTER — Ambulatory Visit (INDEPENDENT_AMBULATORY_CARE_PROVIDER_SITE_OTHER): Payer: Medicare Other | Admitting: *Deleted

## 2017-05-06 DIAGNOSIS — I480 Paroxysmal atrial fibrillation: Secondary | ICD-10-CM | POA: Diagnosis not present

## 2017-05-07 NOTE — Progress Notes (Signed)
Carelink Summary Report / Loop Recorder 

## 2017-05-11 LAB — CUP PACEART REMOTE DEVICE CHECK
Date Time Interrogation Session: 20190301233831
Implantable Pulse Generator Implant Date: 20180830

## 2017-05-14 ENCOUNTER — Ambulatory Visit
Admission: RE | Admit: 2017-05-14 | Discharge: 2017-05-14 | Disposition: A | Discharge status: Home / Self Care | Payer: Medicare Other | Source: Ambulatory Visit | Attending: Obstetrics and Gynecology | Admitting: Obstetrics and Gynecology

## 2017-05-14 DIAGNOSIS — Z1231 Encounter for screening mammogram for malignant neoplasm of breast: Secondary | ICD-10-CM

## 2017-05-28 ENCOUNTER — Ambulatory Visit: Payer: Medicare Other | Admitting: Neurology

## 2017-05-28 ENCOUNTER — Encounter: Payer: Self-pay | Admitting: Neurology

## 2017-05-28 VITALS — BP 139/80 | HR 60 | Wt 153.5 lb

## 2017-05-28 DIAGNOSIS — G43719 Chronic migraine without aura, intractable, without status migrainosus: Secondary | ICD-10-CM | POA: Diagnosis not present

## 2017-05-28 NOTE — Progress Notes (Signed)
Reason for visit: Migraine headache  Suzanne [name] is an 67 y.o. female  History of present illness:  Suzanne [name] is a 67 year old right-handed white female with a history of migraine headache.  The patient has continued on Botox therapy.  She has gained good improvement with the Botox.  She still has relatively frequent headaches with headaches, but they are much less in severity, and it allows her Imitrex and Fioricet to better control the headache.  The patient may have 4 headaches a month, but the headaches may last 3 days at a time.  Sleep will help the headache.  Weather changes are activators.  The patient has recently been placed on CPAP for mild hypopnea, this has significantly improved her early morning headaches and she now sleeps through the night, she does not wake up frequently.  The patient returns to this office for an evaluation.  Past Medical History:  Diagnosis Date  . Anxiety   . Asthma   . Atrial fibrillation ([place])    s/p ablations  . Atrial flutter ([place])   . COPD (chronic obstructive pulmonary disease) ([place])   . Degenerative arthritis    "maybe in my left knee" (12/16/2016)  . Depression   . Frozen shoulder    hx bilaterally; "had to go thru PT; no problem w/them since I learned exercises" (12/16/2016)  . GERD (gastroesophageal reflux disease)   . Glaucoma   . H/O: rheumatic fever 1957  . High cholesterol   . Migraine    "getting botox now; now only 2-3/month" (12/16/2016)  . PONV (postoperative nausea and vomiting)    with Fentanyl,  Dilaudid &  Propofol (12/16/2016)    Past Surgical History:  Procedure Laterality Date  . ABDOMINAL HYSTERECTOMY  1990   right ovary removed  . APPENDECTOMY  1990  . ATRIAL FIBRILLATION ABLATION N/A 12/16/2016   Procedure: ATRIAL FIBRILLATION ABLATION;  Surgeon: Thompson Grayer, MD;  Location: [facility];  Service: Cardiovascular;  Laterality: N/A;  . CTI ablation  3/11  . ESOPHAGOGASTRODUODENOSCOPY (EGD)  WITH PROPOFOL N/A 01/06/2013   Procedure: ESOPHAGOGASTRODUODENOSCOPY (EGD) WITH PROPOFOL;  Surgeon: Cleotis Nipper, MD;  [facility];  Service: Endoscopy;  Laterality: N/A;  . KNEE ARTHROSCOPY Bilateral 1999-2006  . KNEE SURGERY Bilateral [id number]   "open; for congenital deformity"  . LOOP RECORDER INSERTION N/A 10/02/2016   Procedure: LOOP RECORDER INSERTION;  Surgeon: Thompson Grayer, MD;  Location: [facility];  Service: Cardiovascular;  Laterality: N/A;  . PERIPHERAL VASCULAR INTERVENTION  2/11  & 6/11  . RHINOPLASTY  1965  . TONSILLECTOMY  as child  . TOOTH EXTRACTION  2009   2 teeth extractted    Family History  Problem Relation Age of Onset  . Cancer Mother        renal cell  . Migraines Mother   . Dementia Father   . Heart disease Father   . Breast cancer Neg Hx     Social history:  reports that she has been smoking cigarettes.  She has a 25.00 pack-year smoking history. She has never used smokeless tobacco. She reports that she drinks alcohol. She reports that she does not use drugs.    Allergies  Allergen Reactions  . Dilaudid [Hydromorphone Hcl] Nausea And Vomiting  . Fentanyl Nausea And Vomiting  . Ciprofloxacin Rash  . Erythromycin Rash  . Percocet [Oxycodone-Acetaminophen] Nausea And Vomiting    Medications:  Prior to Admission medications   Medication Sig Start Date End  Date Taking? Authorizing Provider  acetaminophen (TYLENOL) 325 MG tablet Take 325 mg 2 (two) times daily as needed by mouth (pain).    Yes [provider]  ALPRAZolam Duanne Moron) 0.5 MG tablet Take 0.5 mg at bedtime as needed by mouth for anxiety or sleep. Anxiety or sleep 12/21/13  Yes [provider]  buPROPion (WELLBUTRIN XL) 300 MG 24 hr tablet Take 300 mg daily by mouth.    Yes [provider]  butalbital-acetaminophen-caffeine (FIORICET, ESGIC) 50-325-40 MG tablet TAKE 1 TABLET BY MOUTH EVERY 6 HOURS AS NEEDED FOR HEADACHE OR MIGRAINE 03/06/17  Yes  Kathrynn Ducking, MD  Cholecalciferol (VITAMIN D PO) Take 6,000 mg by mouth 3 (three) times daily.   Yes [provider]  diltiazem (CARDIZEM) 30 MG tablet Cardizem 30mg  -- take 1 tablet by mouth every 4 hours AS NEEDED for heart rate >100 as long as blood pressure >100. Patient taking differently: Cardizem 30mg  -- take 1 tablet by mouth every 4 hours AS NEEDED for heart rate >120 as long as blood pressure >100. 09/29/16  Yes Allred, Jeneen Rinks, MD  dorzolamide-timolol (COSOPT) 22.3-6.8 MG/ML ophthalmic solution Place 1 drop 2 (two) times daily into both eyes.  06/03/16  Yes [provider]  ELIQUIS 5 MG TABS tablet TAKE 1 TABLET BY MOUTH TWICE DAILY 06/26/16  Yes Allred, Jeneen Rinks, MD  Hypromellose (ARTIFICIAL TEARS OP) Apply 1 drop daily as needed to eye (dry eyes).   Yes [provider]  OnabotulinumtoxinA (BOTOX IJ) Inject as directed every 3 (three) months.   Yes [provider]  sertraline (ZOLOFT) 100 MG tablet Take 100 mg by mouth daily with breakfast.    Yes [provider]  Simethicone (GAS-X PO) Take 2 tablets daily as needed by mouth (gas).   Yes [provider]  sodium chloride (OCEAN) 0.65 % SOLN nasal spray Place 1 spray as needed into both nostrils for congestion.   Yes [provider]  SUMAtriptan (IMITREX) 100 MG tablet TAKE 1 TABLET BY MOUTH AT THE ONSET OF A HEADACHE DAILY AS NEEDED 03/06/17  Yes Kathrynn Ducking, MD  promethazine (PHENERGAN) 25 MG tablet Take 1 tablet (25 mg total) by mouth every 6 (six) hours as needed for nausea or vomiting. Patient not taking: Reported on 05/28/2017 05/26/16   Kathrynn Ducking, MD    ROS:  Out of a complete 14 system review of symptoms, the patient complains only of the following symptoms, and all other reviewed systems are negative.  Blurred vision Headache  Blood pressure 139/80, pulse 60, weight 153 lb 8 oz (69.6 kg).  Physical Exam  General: The patient is alert and cooperative at  the time of the examination.  Skin: No significant peripheral edema is noted.   Neurologic Exam  Mental status: The patient is alert and oriented x 3 at the time of the examination. The patient has apparent normal recent and remote memory, with an apparently normal attention span and concentration ability.   Cranial nerves: Facial symmetry is present. Speech is normal, no aphasia or dysarthria is noted. Extraocular movements are full. Visual fields are full.  Motor: The patient has good strength in all 4 extremities.  Sensory examination: Soft touch sensation is symmetric on the face, arms, and legs.  Coordination: The patient has good finger-nose-finger and heel-to-shin bilaterally.  Gait and station: The patient has a normal gait. Tandem gait is normal. Romberg is negative. No drift is seen.  Reflexes: Deep tendon reflexes are symmetric.  Assessment/Plan:  1.  Migraine headache, intractable  The patient is doing fairly well with the Botox, clearly the Botox has offered good benefit and has allow the patient to have fewer disabling headaches throughout the month.  The patient will continue her Botox treatments.  She will follow-up in 1 year.  Jill Alexanders MD 05/28/2017 10:10 AM  [facility] [address] [place], [place] [address]  Phone [phone] Fax [phone]

## 2017-06-04 MED FILL — ALPRAZolam 0.5 MG TABS: 0.5 | 90 days supply | Qty: 90 | Fill #0

## 2017-06-04 MED FILL — BUTALB-ACETAMIN-CAFF 50-325: 50-325-40 | 22 days supply | Qty: 90 | Fill #1

## 2017-06-08 ENCOUNTER — Ambulatory Visit (INDEPENDENT_AMBULATORY_CARE_PROVIDER_SITE_OTHER): Payer: Medicare Other | Admitting: *Deleted

## 2017-06-08 DIAGNOSIS — I480 Paroxysmal atrial fibrillation: Secondary | ICD-10-CM

## 2017-06-09 NOTE — Progress Notes (Signed)
Carelink Summary Report / Loop Recorder 

## 2017-06-10 LAB — CUP PACEART REMOTE DEVICE CHECK
MDC IDC PG IMPLANT DT: 20180830
MDC IDC SESS DTM: 20190404000927

## 2017-06-17 ENCOUNTER — Ambulatory Visit: Payer: Medicare Other | Admitting: Internal Medicine

## 2017-06-17 ENCOUNTER — Encounter: Payer: Self-pay | Admitting: Internal Medicine

## 2017-06-17 VITALS — BP 122/62 | HR 61 | Ht 67.0 in | Wt 155.0 lb

## 2017-06-17 DIAGNOSIS — G4733 Obstructive sleep apnea (adult) (pediatric): Secondary | ICD-10-CM | POA: Diagnosis not present

## 2017-06-17 DIAGNOSIS — I480 Paroxysmal atrial fibrillation: Secondary | ICD-10-CM

## 2017-06-17 DIAGNOSIS — I4892 Unspecified atrial flutter: Secondary | ICD-10-CM

## 2017-06-17 DIAGNOSIS — I495 Sick sinus syndrome: Secondary | ICD-10-CM

## 2017-06-17 LAB — CUP PACEART INCLINIC DEVICE CHECK
Implantable Pulse Generator Implant Date: 20180830
MDC IDC SESS DTM: 20190515113243

## 2017-06-17 NOTE — Progress Notes (Signed)
PCP: Vernie Shanks, MD Primary Cardiologist: Dr Tamala Julian Primary EP: Dr Rayann Heman  Suzanne [name] is a 67 y.o. female who presents today for routine electrophysiology followup.  Since last being seen in our clinic, the patient reports doing very well.  No afib.  She enjoyed [place].  Plans to go to [place] in September.  Today, she denies symptoms of palpitations, chest pain, shortness of breath,  lower extremity edema, dizziness, presyncope, or syncope.  The patient is otherwise without complaint today.   Past Medical History:  Diagnosis Date  . Anxiety   . Asthma   . Atrial fibrillation ([place])    s/p ablations  . Atrial flutter ([place])   . COPD (chronic obstructive pulmonary disease) ([place])   . Degenerative arthritis    "maybe in my left knee" (12/16/2016)  . Depression   . Frozen shoulder    hx bilaterally; "had to go thru PT; no problem w/them since I learned exercises" (12/16/2016)  . GERD (gastroesophageal reflux disease)   . Glaucoma   . H/O: rheumatic fever 1957  . High cholesterol   . Migraine    "getting botox now; now only 2-3/month" (12/16/2016)  . PONV (postoperative nausea and vomiting)    with Fentanyl,  Dilaudid &  Propofol (12/16/2016)   Past Surgical History:  Procedure Laterality Date  . ABDOMINAL HYSTERECTOMY  1990   right ovary removed  . APPENDECTOMY  1990  . ATRIAL FIBRILLATION ABLATION N/A 12/16/2016   Procedure: ATRIAL FIBRILLATION ABLATION;  Surgeon: Thompson Grayer, MD;  Location: [facility];  Service: Cardiovascular;  Laterality: N/A;  . CTI ablation  3/11  . ESOPHAGOGASTRODUODENOSCOPY (EGD) WITH PROPOFOL N/A 01/06/2013   Procedure: ESOPHAGOGASTRODUODENOSCOPY (EGD) WITH PROPOFOL;  Surgeon: Cleotis Nipper, MD;  [facility];  Service: Endoscopy;  Laterality: N/A;  . KNEE ARTHROSCOPY Bilateral 1999-2006  . KNEE SURGERY Bilateral [id number]   "open; for congenital deformity"  . LOOP RECORDER INSERTION N/A 10/02/2016   Procedure: LOOP  RECORDER INSERTION;  Surgeon: Thompson Grayer, MD;  Location: [facility];  Service: Cardiovascular;  Laterality: N/A;  . PERIPHERAL VASCULAR INTERVENTION  2/11  & 6/11  . RHINOPLASTY  1965  . TONSILLECTOMY  as child  . TOOTH EXTRACTION  2009   2 teeth extractted    ROS- all systems are reviewed and negatives except as per HPI above  Current Outpatient Medications  Medication Sig Dispense Refill  . acetaminophen (TYLENOL) 325 MG tablet Take 325 mg 2 (two) times daily as needed by mouth (pain).     Marland Kitchen ALPRAZolam (XANAX) 0.5 MG tablet Take 0.5 mg at bedtime as needed by mouth for anxiety or sleep. Anxiety or sleep    . buPROPion (WELLBUTRIN XL) 300 MG 24 hr tablet Take 300 mg daily by mouth.     . butalbital-acetaminophen-caffeine (FIORICET, ESGIC) 50-325-40 MG tablet TAKE 1 TABLET BY MOUTH EVERY 6 HOURS AS NEEDED FOR HEADACHE OR MIGRAINE 90 tablet 3  . Cholecalciferol (VITAMIN D PO) Take 6,000 mg by mouth 3 (three) times daily.    . Cholecalciferol (VITAMIN D) 2000 units CAPS Take 2,000 Units by mouth 3 (three) times daily.    Marland Kitchen diltiazem (CARDIZEM) 30 MG tablet Cardizem 30mg  -- take 1 tablet by mouth every 4 hours AS NEEDED for heart rate >100 as long as blood pressure >100. (Patient taking differently: Cardizem 30mg  -- take 1 tablet by mouth every 4 hours AS NEEDED for heart rate >120 as long as blood pressure >100.)  45 tablet 3  . dorzolamide-timolol (COSOPT) 22.3-6.8 MG/ML ophthalmic solution Place 1 drop 2 (two) times daily into both eyes.   3  . ELIQUIS 5 MG TABS tablet TAKE 1 TABLET BY MOUTH TWICE DAILY 180 tablet 3  . Hypromellose (ARTIFICIAL TEARS OP) Apply 1 drop daily as needed to eye (dry eyes).    . OnabotulinumtoxinA (BOTOX IJ) Inject as directed every 3 (three) months.    . sertraline (ZOLOFT) 100 MG tablet Take 100 mg by mouth daily with breakfast.     . Simethicone (GAS-X PO) Take 2 tablets daily as needed by mouth (gas).    . sodium chloride (OCEAN) 0.65 % SOLN nasal  spray Place 1 spray as needed into both nostrils for congestion.    . SUMAtriptan (IMITREX) 100 MG tablet TAKE 1 TABLET BY MOUTH AT THE ONSET OF A HEADACHE DAILY AS NEEDED 27 tablet 3   No current facility-administered medications for this visit.     Physical Exam: Vitals:   06/17/17 1051  BP: 122/62  Pulse: 61  Weight: 155 lb (70.3 kg)  Height: 5\' 7"  (1.702 m)    GEN- The patient is well appearing, alert and oriented x 3 today.   Head- normocephalic, atraumatic Eyes-  Sclera clear, conjunctiva pink Ears- hearing intact Oropharynx- clear Lungs- Clear to ausculation bilaterally, normal work of breathing Heart- Regular rate and rhythm, no murmurs, rubs or gallops, PMI not laterally displaced GI- soft, NT, ND, + BS Extremities- no clubbing, cyanosis, or edema  EKG tracing ordered today is personally reviewed and shows sinus rhythm  Assessment and Plan:  1. Paroxysmal atrial fibrillation/ atrial flutter Doing well s/p ablation chads2vasc score is 2.  On eliquis.  We discussed 2019 AF guideline update regarding anticoagulation. She prefers to stay on eliquis at this time. ILR reveals no AF since November (reviewed today)  2. OSA Evaluated by Dr Maxwell Caul  Return to see me in 6 months.  If doing well at that time, then I will see annually.  Thompson Grayer MD, [facility] 06/17/2017 11:16 AM

## 2017-06-17 NOTE — Patient Instructions (Addendum)

## 2017-06-30 LAB — CUP PACEART REMOTE DEVICE CHECK
Implantable Pulse Generator Implant Date: 20180830
MDC IDC SESS DTM: 20190507003837

## 2017-07-06 ENCOUNTER — Other Ambulatory Visit: Payer: Self-pay | Admitting: Internal Medicine

## 2017-07-06 MED FILL — buPROPion HCL ER (XL) 300 M: 300 | 90 days supply | Qty: 90 | Fill #1

## 2017-07-06 MED FILL — ELIQUIS 5 MG TABLET: 5 | 90 days supply | Qty: 180 | Fill #0

## 2017-07-06 NOTE — Telephone Encounter (Signed)
Age 67 years Wt 70.3kg on 06/17/2017 Saw Dr Rayann Heman on 06/17/2017  12/05/2016 Hgb 12.7 HCT 37.9  12/05/2017 SrCr 0.66 Refill done for Eliquis 5mg  q 12 hours as requested

## 2017-07-07 MED FILL — SERTRALINE HCL 100 MG TAB: 100 | 90 days supply | Qty: 90 | Fill #0

## 2017-07-09 ENCOUNTER — Ambulatory Visit: Payer: Medicare Other | Admitting: Neurology

## 2017-07-09 ENCOUNTER — Encounter: Payer: Self-pay | Admitting: Neurology

## 2017-07-09 VITALS — BP 126/71 | HR 66 | Ht 67.0 in

## 2017-07-09 DIAGNOSIS — G43719 Chronic migraine without aura, intractable, without status migrainosus: Secondary | ICD-10-CM | POA: Diagnosis not present

## 2017-07-09 MED ORDER — ONABOTULINUMTOXINA 100 UNITS IJ SOLR
100.0000 [IU] | Freq: Once | INTRAMUSCULAR | Status: AC
  Administered 2017-07-09: 100 [IU] via INTRAMUSCULAR

## 2017-07-09 NOTE — Procedures (Signed)
     BOTOX PROCEDURE NOTE FOR MIGRAINE HEADACHE   HISTORY: Suzanne [name] is a 67 year old patient with a history of intractable migraine headaches.  The patient has gotten excellent benefit from Botox, her headache frequency has reduced significantly and the severity the headaches has reduced.  She has only required 5 Imitrex tablets since last seen 3 months ago.  She returns for another Botox treatment.   Description of procedure:  The patient was placed in a sitting position. The standard protocol was used for Botox as follows, with 5 units of Botox injected at each site:   -Procerus muscle, midline injection  -Corrugator muscle, bilateral injection  -Frontalis muscle, bilateral injection, with 2 sites each side, medial injection was performed in the upper one third of the frontalis muscle, in the region vertical from the medial inferior edge of the superior orbital rim. The lateral injection was again in the upper one third of the forehead vertically above the lateral limbus of the cornea, 1.5 cm lateral to the medial injection site.  -Temporalis muscle injection, 4 sites, bilaterally. The first injection was 3 cm above the tragus of the ear, second injection site was 1.5 cm to 3 cm up from the first injection site in line with the tragus of the ear. The third injection site was 1.5-3 cm forward between the first 2 injection sites. The fourth injection site was 1.5 cm posterior to the second injection site.  -Occipitalis muscle injection, 3 sites, bilaterally. The first injection was done one half way between the occipital protuberance and the tip of the mastoid process behind the ear. The second injection site was done lateral and superior to the first, 1 fingerbreadth from the first injection. The third injection site was 1 fingerbreadth superiorly and medially from the first injection site.  -Cervical paraspinal muscle injection, 2 sites, bilateral, the first injection site was 1 cm  from the midline of the cervical spine, 3 cm inferior to the lower border of the occipital protuberance. The second injection site was 1.5 cm superiorly and laterally to the first injection site.  -Trapezius muscle injection was performed at 3 sites, bilaterally. The first injection site was in the upper trapezius muscle halfway between the inflection point of the neck, and the acromion. The second injection site was one half way between the acromion and the first injection site. The third injection was done between the first injection site and the inflection point of the neck.   A 200 unit bottle of Botox was used, 155 units were injected, the rest of the Botox was wasted. The patient tolerated the procedure well, there were no complications of the above procedure.  Botox NDC 2094-7096-28 Lot number [id number] Expiration date December 2021

## 2017-07-09 NOTE — Progress Notes (Signed)
Please refer to Botox procedure note. 

## 2017-07-13 ENCOUNTER — Ambulatory Visit (INDEPENDENT_AMBULATORY_CARE_PROVIDER_SITE_OTHER): Payer: Medicare Other | Admitting: *Deleted

## 2017-07-13 DIAGNOSIS — I480 Paroxysmal atrial fibrillation: Secondary | ICD-10-CM | POA: Diagnosis not present

## 2017-07-13 NOTE — Progress Notes (Signed)
Carelink Summary Report / Loop Recorder 

## 2017-08-13 ENCOUNTER — Ambulatory Visit (INDEPENDENT_AMBULATORY_CARE_PROVIDER_SITE_OTHER): Payer: Medicare Other | Admitting: *Deleted

## 2017-08-13 DIAGNOSIS — I480 Paroxysmal atrial fibrillation: Secondary | ICD-10-CM | POA: Diagnosis not present

## 2017-08-14 NOTE — Progress Notes (Signed)
Carelink Summary Report / Loop Recorder 

## 2017-08-17 MED FILL — DORZOLAMIDE-TIMOLOL EYE DRP: 22.3-6.8 | 50 days supply | Qty: 10 | Fill #1

## 2017-08-18 LAB — CUP PACEART REMOTE DEVICE CHECK
Date Time Interrogation Session: 20190609003906
Implantable Pulse Generator Implant Date: 20180830

## 2017-09-03 MED FILL — DUREZOL 0.05% EYE DROPS: 0.05 | 25 days supply | Qty: 5 | Fill #0

## 2017-09-03 MED FILL — BUTALB-ACETAMIN-CAFF 50-325: 50-325-40 | 22 days supply | Qty: 90 | Fill #2

## 2017-09-03 MED FILL — MOXIFLOXACIN HCL 0.5 % SOLN: 0.5 | 3 days supply | Qty: 3 | Fill #0

## 2017-09-07 MED FILL — NEO/POLY/DEXAMET EYE OINT: 3.5-10000-0 | 10 days supply | Qty: 4 | Fill #0

## 2017-09-15 ENCOUNTER — Ambulatory Visit (INDEPENDENT_AMBULATORY_CARE_PROVIDER_SITE_OTHER): Payer: Medicare Other | Admitting: *Deleted

## 2017-09-15 DIAGNOSIS — I480 Paroxysmal atrial fibrillation: Secondary | ICD-10-CM | POA: Diagnosis not present

## 2017-09-16 NOTE — Progress Notes (Signed)
Carelink Summary Report / Loop Recorder 

## 2017-09-23 LAB — CUP PACEART REMOTE DEVICE CHECK
Implantable Pulse Generator Implant Date: 20180830
MDC IDC SESS DTM: 20190712003651

## 2017-09-24 ENCOUNTER — Other Ambulatory Visit: Payer: Self-pay | Admitting: Neurology

## 2017-09-28 MED FILL — SERTRALINE HCL 100 MG TAB: 100 | 90 days supply | Qty: 90 | Fill #1

## 2017-09-28 MED FILL — DORZOLAMIDE-TIMOLOL EYE DRP: 22.3-6.8 | 50 days supply | Qty: 10 | Fill #2

## 2017-09-28 MED FILL — buPROPion HCL ER (XL) 300 M: 300 | 90 days supply | Qty: 90 | Fill #0

## 2017-09-28 MED FILL — ELIQUIS 5 MG TABLET: 5 | 90 days supply | Qty: 180 | Fill #1

## 2017-09-28 MED FILL — ALPRAZolam 0.5 MG TABS: 0.5 | 90 days supply | Qty: 90 | Fill #0

## 2017-10-01 MED FILL — DICLOFENAC SODIUM 1% GEL: 1 | 37 days supply | Qty: 300 | Fill #0

## 2017-10-02 ENCOUNTER — Ambulatory Visit: Payer: Medicare Other | Admitting: Neurology

## 2017-10-19 ENCOUNTER — Telehealth: Payer: Self-pay | Admitting: Neurology

## 2017-10-19 ENCOUNTER — Ambulatory Visit (INDEPENDENT_AMBULATORY_CARE_PROVIDER_SITE_OTHER): Payer: Medicare Other | Admitting: *Deleted

## 2017-10-19 DIAGNOSIS — I480 Paroxysmal atrial fibrillation: Secondary | ICD-10-CM

## 2017-10-19 NOTE — Progress Notes (Signed)
Carelink Summary Report / Loop Recorder 

## 2017-10-19 NOTE — Telephone Encounter (Signed)
[id number] (01/18/18)

## 2017-10-19 NOTE — Telephone Encounter (Signed)
I called the pharmacy and spoke with Lelon Frohlich who scheduled delivery.

## 2017-10-20 ENCOUNTER — Telehealth: Payer: Self-pay | Admitting: Cardiology

## 2017-10-20 NOTE — Telephone Encounter (Signed)
LMOVM requesting that pt send manual transmission b/c home monitor has not updated in at least 14 days.    

## 2017-10-22 ENCOUNTER — Ambulatory Visit: Payer: Medicare Other | Admitting: Neurology

## 2017-10-22 ENCOUNTER — Encounter: Payer: Self-pay | Admitting: Neurology

## 2017-10-22 VITALS — BP 122/69 | HR 62

## 2017-10-22 DIAGNOSIS — G43719 Chronic migraine without aura, intractable, without status migrainosus: Secondary | ICD-10-CM

## 2017-10-22 LAB — CUP PACEART REMOTE DEVICE CHECK
Implantable Pulse Generator Implant Date: 20180830
MDC IDC SESS DTM: 20190814003651

## 2017-10-22 MED ORDER — ONABOTULINUMTOXINA 100 UNITS IJ SOLR
200.0000 [IU] | Freq: Once | INTRAMUSCULAR | Status: AC
  Administered 2017-10-22: 200 [IU] via INTRAMUSCULAR

## 2017-10-22 NOTE — Procedures (Signed)
     BOTOX PROCEDURE NOTE FOR MIGRAINE HEADACHE   HISTORY: Suzanne [name] is a 67 year old white female with a history of intractable migraine headaches.  She has had a headache for the last 9 days prior to her Botox injections.  She continues to get excellent benefit with the medication.   Description of procedure:  The patient was placed in a sitting position. The standard protocol was used for Botox as follows, with 5 units of Botox injected at each site:   -Procerus muscle, midline injection  -Corrugator muscle, bilateral injection  -Frontalis muscle, bilateral injection, with 2 sites each side, medial injection was performed in the upper one third of the frontalis muscle, in the region vertical from the medial inferior edge of the superior orbital rim. The lateral injection was again in the upper one third of the forehead vertically above the lateral limbus of the cornea, 1.5 cm lateral to the medial injection site.  -Temporalis muscle injection, 4 sites, bilaterally. The first injection was 3 cm above the tragus of the ear, second injection site was 1.5 cm to 3 cm up from the first injection site in line with the tragus of the ear. The third injection site was 1.5-3 cm forward between the first 2 injection sites. The fourth injection site was 1.5 cm posterior to the second injection site.  -Occipitalis muscle injection, 3 sites, bilaterally. The first injection was done one half way between the occipital protuberance and the tip of the mastoid process behind the ear. The second injection site was done lateral and superior to the first, 1 fingerbreadth from the first injection. The third injection site was 1 fingerbreadth superiorly and medially from the first injection site.  -Cervical paraspinal muscle injection, 2 sites, bilateral, the first injection site was 1 cm from the midline of the cervical spine, 3 cm inferior to the lower border of the occipital protuberance. The second  injection site was 1.5 cm superiorly and laterally to the first injection site.  -Trapezius muscle injection was performed at 3 sites, bilaterally. The first injection site was in the upper trapezius muscle halfway between the inflection point of the neck, and the acromion. The second injection site was one half way between the acromion and the first injection site. The third injection was done between the first injection site and the inflection point of the neck.   A 200 unit bottle of Botox was used, 155 units were injected, the rest of the Botox was wasted. The patient tolerated the procedure well, there were no complications of the above procedure.  Botox NDC 6812-7517-00 Lot number [id number] Expiration date April 2022

## 2017-10-30 ENCOUNTER — Encounter: Payer: Self-pay | Admitting: Family Medicine

## 2017-10-30 ENCOUNTER — Telehealth: Payer: Self-pay | Admitting: *Deleted

## 2017-10-30 NOTE — Telephone Encounter (Signed)
Received my chart message form patient: Just a note to update my immunizations information: on 10/29/2017 I received the 2019-2020 flu vaccine (Fluzone High-Dose,   [employer], Lot [phone] at [facility]

## 2017-11-01 LAB — CUP PACEART REMOTE DEVICE CHECK
Date Time Interrogation Session: 20190916013806
Implantable Pulse Generator Implant Date: 20180830

## 2017-11-16 MED FILL — DUREZOL 0.05% EYE DROPS: 0.05 | 25 days supply | Qty: 5 | Fill #0

## 2017-11-16 MED FILL — MOXIFLOXACIN HCL 0.5 % SOLN: 0.5 | 15 days supply | Qty: 3 | Fill #0

## 2017-11-20 ENCOUNTER — Ambulatory Visit (INDEPENDENT_AMBULATORY_CARE_PROVIDER_SITE_OTHER): Payer: Medicare Other | Admitting: *Deleted

## 2017-11-20 DIAGNOSIS — I480 Paroxysmal atrial fibrillation: Secondary | ICD-10-CM | POA: Diagnosis not present

## 2017-11-23 NOTE — Progress Notes (Signed)
Carelink Summary Report / Loop Recorder 

## 2017-12-03 MED FILL — BUTALB-ACETAMIN-CAFF 50-325: 50-325-40 | 22 days supply | Qty: 90 | Fill #3

## 2017-12-03 MED FILL — DORZOLAMIDE-TIMOLOL EYE DRP: 22.3-6.8 | 50 days supply | Qty: 10 | Fill #3

## 2017-12-11 LAB — CUP PACEART REMOTE DEVICE CHECK
Implantable Pulse Generator Implant Date: 20180830
MDC IDC SESS DTM: 20191019020702

## 2017-12-17 ENCOUNTER — Telehealth: Payer: Self-pay | Admitting: Neurology

## 2017-12-17 NOTE — Telephone Encounter (Signed)
Botox letter regarding [facility] mailed to patient °

## 2017-12-21 ENCOUNTER — Encounter: Payer: Self-pay | Admitting: Internal Medicine

## 2017-12-21 ENCOUNTER — Ambulatory Visit: Payer: Medicare Other | Admitting: Internal Medicine

## 2017-12-21 VITALS — BP 110/72 | HR 58 | Ht 67.0 in | Wt 152.0 lb

## 2017-12-21 DIAGNOSIS — G4733 Obstructive sleep apnea (adult) (pediatric): Secondary | ICD-10-CM

## 2017-12-21 DIAGNOSIS — I480 Paroxysmal atrial fibrillation: Secondary | ICD-10-CM | POA: Diagnosis not present

## 2017-12-21 DIAGNOSIS — I4892 Unspecified atrial flutter: Secondary | ICD-10-CM | POA: Diagnosis not present

## 2017-12-21 DIAGNOSIS — I495 Sick sinus syndrome: Secondary | ICD-10-CM

## 2017-12-21 LAB — CUP PACEART INCLINIC DEVICE CHECK
Date Time Interrogation Session: 20191118150637
MDC IDC PG IMPLANT DT: 20180830

## 2017-12-21 NOTE — Patient Instructions (Signed)

## 2017-12-21 NOTE — Progress Notes (Signed)
PCP: Marin Olp, MD Primary Cardiologist: Dr Tamala Julian Primary EP: Dr Rayann Heman  Suzanne [name] is a 67 y.o. female who presents today for routine electrophysiology followup.  Since last being seen in our clinic, the patient reports doing very well.  Today, she denies symptoms of palpitations, chest pain, shortness of breath,  lower extremity edema, dizziness, presyncope, or syncope.  The patient is otherwise without complaint today.   Past Medical History:  Diagnosis Date  . Anxiety   . Asthma   . Atrial fibrillation ([place])    s/p ablations  . Atrial flutter ([place])   . COPD (chronic obstructive pulmonary disease) ([place])   . Degenerative arthritis    "maybe in my left knee" (12/16/2016)  . Depression   . Frozen shoulder    hx bilaterally; "had to go thru PT; no problem w/them since I learned exercises" (12/16/2016)  . GERD (gastroesophageal reflux disease)   . Glaucoma   . H/O: rheumatic fever 1957  . High cholesterol   . Migraine    "getting botox now; now only 2-3/month" (12/16/2016)  . PONV (postoperative nausea and vomiting)    with Fentanyl,  Dilaudid &  Propofol (12/16/2016)   Past Surgical History:  Procedure Laterality Date  . ABDOMINAL HYSTERECTOMY  1990   right ovary removed  . APPENDECTOMY  1990  . ATRIAL FIBRILLATION ABLATION N/A 12/16/2016   Procedure: ATRIAL FIBRILLATION ABLATION;  Surgeon: Thompson Grayer, MD;  Location: [facility] [facility];  Service: Cardiovascular;  Laterality: N/A;  . CTI ablation  3/11  . ESOPHAGOGASTRODUODENOSCOPY (EGD) WITH PROPOFOL N/A 01/06/2013   Procedure: ESOPHAGOGASTRODUODENOSCOPY (EGD) WITH PROPOFOL;  Surgeon: Cleotis Nipper, MD;  [facility];  Service: Endoscopy;  Laterality: N/A;  . KNEE ARTHROSCOPY Bilateral 1999-2006  . KNEE SURGERY Bilateral [id number]   "open; for congenital deformity"  . LOOP RECORDER INSERTION N/A 10/02/2016   Procedure: LOOP RECORDER INSERTION;  Surgeon: Thompson Grayer, MD;  Location: [facility];  Service: Cardiovascular;  Laterality: N/A;  . PERIPHERAL VASCULAR INTERVENTION  2/11  & 6/11  . RHINOPLASTY  1965  . TONSILLECTOMY  as child  . TOOTH EXTRACTION  2009   2 teeth extractted    ROS- all systems are reviewed and negatives except as per HPI above  Current Outpatient Medications  Medication Sig Dispense Refill  . acetaminophen (TYLENOL) 325 MG tablet Take 325 mg 2 (two) times daily as needed by mouth (pain).     Marland Kitchen ALPRAZolam (XANAX) 0.5 MG tablet Take 0.5 mg at bedtime as needed by mouth for anxiety or sleep. Anxiety or sleep    . BOTOX 200 units SOLR INJECT 200 UNITS INTRAMUSCULARLY EVERY 3 MONTHS (GIVEN AT MD OFFICE, DISCARD UNUSED PORTION AFTER FIRST USE) 1 each 3  . buPROPion (WELLBUTRIN XL) 300 MG 24 hr tablet Take 300 mg daily by mouth.     . butalbital-acetaminophen-caffeine (FIORICET, ESGIC) 50-325-40 MG tablet TAKE 1 TABLET BY MOUTH EVERY 6 HOURS AS NEEDED FOR HEADACHE OR MIGRAINE 90 tablet 3  . diltiazem (CARDIZEM) 30 MG tablet Cardizem 30mg  -- take 1 tablet by mouth every 4 hours AS NEEDED for heart rate >100 as long as blood pressure >100. (Patient taking differently: Cardizem 30mg  -- take 1 tablet by mouth every 4 hours AS NEEDED for heart rate >120 as long as blood pressure >100.) 45 tablet 3  . dorzolamide-timolol (COSOPT) 22.3-6.8 MG/ML ophthalmic solution Place 1 drop 2 (two) times daily into both eyes.   3  .  ELIQUIS 5 MG TABS tablet TAKE 1 TABLET BY MOUTH TWICE DAILY 180 tablet 1  . Hypromellose (ARTIFICIAL TEARS OP) Apply 1 drop daily as needed to eye (dry eyes).    . sertraline (ZOLOFT) 100 MG tablet Take 100 mg by mouth daily with breakfast.     . Simethicone (GAS-X PO) Take 2 tablets daily as needed by mouth (gas).    . sodium chloride (OCEAN) 0.65 % SOLN nasal spray Place 1 spray as needed into both nostrils for congestion.    . SUMAtriptan (IMITREX) 100 MG tablet TAKE 1 TABLET BY MOUTH AT THE ONSET OF A HEADACHE DAILY AS NEEDED 27 tablet 3    No current facility-administered medications for this visit.     Physical Exam: Vitals:   12/21/17 1435  BP: 110/72  Pulse: (!) 58  SpO2: 99%  Weight: 152 lb (68.9 kg)  Height: 5\' 7"  (1.702 m)    GEN- The patient is well appearing, alert and oriented x 3 today.   Head- normocephalic, atraumatic Eyes-  Sclera clear, conjunctiva pink Ears- hearing intact Oropharynx- clear Lungs- Clear to ausculation bilaterally, normal work of breathing Heart- Regular rate and rhythm, no murmurs, rubs or gallops, PMI not laterally displaced GI- soft, NT, ND, + BS Extremities- no clubbing, cyanosis, or edema  Wt Readings from Last 3 Encounters:  12/21/17 152 lb (68.9 kg)  06/17/17 155 lb (70.3 kg)  05/28/17 153 lb 8 oz (69.6 kg)    EKG tracing ordered today is personally reviewed and shows sinus rhythm  Assessment and Plan:  1. Paroxysmal atrial fibrillation and atrial flutter Doing very well No afib by ILR since last visit chads2vasc score is 2.  She is on eliquis  2. OSA Follows with Dr Maxwell Caul,  Using CPAP and very excited about improved symptoms with this  Return in a year [facility]  Thompson Grayer MD, [facility] 12/21/2017 2:41 PM

## 2017-12-23 ENCOUNTER — Other Ambulatory Visit: Payer: Self-pay | Admitting: Internal Medicine

## 2017-12-23 ENCOUNTER — Ambulatory Visit (INDEPENDENT_AMBULATORY_CARE_PROVIDER_SITE_OTHER): Payer: Medicare Other

## 2017-12-23 DIAGNOSIS — I495 Sick sinus syndrome: Secondary | ICD-10-CM | POA: Diagnosis not present

## 2017-12-23 DIAGNOSIS — I480 Paroxysmal atrial fibrillation: Secondary | ICD-10-CM

## 2017-12-23 MED FILL — ELIQUIS 5 MG TABLET: 5 | 90 days supply | Qty: 180 | Fill #0

## 2017-12-23 MED FILL — buPROPion HCL ER (XL) 300 M: 300 | 90 days supply | Qty: 90 | Fill #1

## 2017-12-23 MED FILL — SERTRALINE HCL 100 MG TAB: 100 | 90 days supply | Qty: 90 | Fill #0

## 2017-12-23 NOTE — Telephone Encounter (Signed)
Eliquis 5mg  refill request received; pt is 67 yrs old, wt-68.9kg, Crea-0.72 via [facility] on 04/28/17, last seen by Dr. Rayann Heman on 12/21/17; will send in refill to requested pharmacy.

## 2017-12-24 NOTE — Progress Notes (Signed)
Carelink Summary Report / Loop Recorder 

## 2017-12-25 MED FILL — ALPRAZolam 0.5 MG TABS: 0.5 | 90 days supply | Qty: 90 | Fill #0

## 2018-01-08 ENCOUNTER — Encounter: Payer: Self-pay | Admitting: Family Medicine

## 2018-01-08 ENCOUNTER — Ambulatory Visit: Payer: Medicare Other | Admitting: Family Medicine

## 2018-01-08 DIAGNOSIS — J449 Chronic obstructive pulmonary disease, unspecified: Secondary | ICD-10-CM

## 2018-01-08 DIAGNOSIS — I480 Paroxysmal atrial fibrillation: Secondary | ICD-10-CM | POA: Diagnosis not present

## 2018-01-08 DIAGNOSIS — F325 Major depressive disorder, single episode, in full remission: Secondary | ICD-10-CM

## 2018-01-08 DIAGNOSIS — H409 Unspecified glaucoma: Secondary | ICD-10-CM | POA: Insufficient documentation

## 2018-01-08 DIAGNOSIS — E7800 Pure hypercholesterolemia, unspecified: Secondary | ICD-10-CM

## 2018-01-08 DIAGNOSIS — R7989 Other specified abnormal findings of blood chemistry: Secondary | ICD-10-CM

## 2018-01-08 DIAGNOSIS — Z720 Tobacco use: Secondary | ICD-10-CM

## 2018-01-08 DIAGNOSIS — E559 Vitamin D deficiency, unspecified: Secondary | ICD-10-CM | POA: Insufficient documentation

## 2018-01-08 DIAGNOSIS — M1712 Unilateral primary osteoarthritis, left knee: Secondary | ICD-10-CM | POA: Insufficient documentation

## 2018-01-08 NOTE — Progress Notes (Signed)
Phone: [phone]  Subjective:  Patient presents today to establish care.  Prior patient of Dr. Rex Kras with [facility]- then saw Dr. Jacelyn Grip more recently- wanted to get plugged in with [facility]. Chief complaint-noted.   See problem oriented charting  The following were reviewed and entered/updated in epic: Past Medical History:  Diagnosis Date  . Anxiety   . Asthma   . Atrial fibrillation ([place])    s/p ablations  . Atrial flutter ([place])   . COPD (chronic obstructive pulmonary disease) ([place])   . Degenerative arthritis    "maybe in my left knee" (12/16/2016)  . Depression   . Frozen shoulder    hx bilaterally; "had to go thru PT; no problem w/them since I learned exercises" (12/16/2016)  . GERD (gastroesophageal reflux disease)   . Glaucoma   . H/O: rheumatic fever 1957  . High cholesterol   . Migraine    "getting botox now; now only 2-3/month" (12/16/2016)  . PONV (postoperative nausea and vomiting)    with Fentanyl,  Dilaudid &  Propofol (12/16/2016)   Patient Active Problem List   Diagnosis Date Noted  . Paroxysmal atrial fibrillation [facility]) s/p ablation november 2018, 2011 previously 12/16/2016    Priority: High  . Tobacco abuse 10/03/2013    Priority: High  . COPD (chronic obstructive pulmonary disease) ([place]) 02/01/2009    Priority: High  . Glaucoma 01/08/2018    Priority: Medium  . Osteoarthritis of left knee 01/08/2018    Priority: Medium  . High cholesterol     Priority: Medium  . Intractable chronic migraine without aura 05/17/2014    Priority: Medium  . Depression, major, single episode, complete remission ([place]) 02/01/2009    Priority: Medium  . Asthma 02/01/2009    Priority: Medium  . Low vitamin D level 01/08/2018    Priority: Low  . Nasal injury 10/20/2016    Priority: Low  . S/P ablation of atrial fibrillation 05/16/2013    Priority: Low   Past Surgical History:  Procedure Laterality Date  . ABDOMINAL HYSTERECTOMY  1990   right ovary removed  .  APPENDECTOMY  1990  . ATRIAL FIBRILLATION ABLATION N/A 12/16/2016   Procedure: ATRIAL FIBRILLATION ABLATION;  Surgeon: Thompson Grayer, MD;  Location: [place];  Service: Cardiovascular;  Laterality: N/A;  . CTI ablation  3/11  . ESOPHAGOGASTRODUODENOSCOPY (EGD) WITH PROPOFOL N/A 01/06/2013   Procedure: ESOPHAGOGASTRODUODENOSCOPY (EGD) WITH PROPOFOL;  Surgeon: Cleotis Nipper, MD;  Location: [facility];  Service: Endoscopy;  Laterality: N/A;  . KNEE ARTHROSCOPY Bilateral 1999-2006  . KNEE SURGERY Bilateral [id number]   "open; for congenital deformity"  . LOOP RECORDER INSERTION N/A 10/02/2016   Procedure: LOOP RECORDER INSERTION;  Surgeon: Thompson Grayer, MD;  Location: [facility];  Service: Cardiovascular;  Laterality: N/A;  . PERIPHERAL VASCULAR INTERVENTION  2/11  & 6/11  . RHINOPLASTY  1965  . TONSILLECTOMY  as child  . TOOTH EXTRACTION  2009   2 teeth extractted    Family History  Problem Relation Age of Onset  . Cancer Mother        renal cell  . Migraines Mother   . Dementia Father   . Heart disease Father        mid 78s  . Other Brother        died 90- out for run- no autopsy  . Hyperlipidemia Brother   . Breast cancer Neg Hx    Medications- reviewed and updated Current Outpatient Medications  Medication Sig Dispense  Refill  . acetaminophen (TYLENOL) 325 MG tablet Take 325 mg 2 (two) times daily as needed by mouth (pain).     Marland Kitchen ALPRAZolam (XANAX) 0.5 MG tablet Take 0.5 mg at bedtime as needed by mouth for anxiety or sleep. Anxiety or sleep    . BOTOX 200 units SOLR INJECT 200 UNITS INTRAMUSCULARLY EVERY 3 MONTHS (GIVEN AT MD OFFICE, DISCARD UNUSED PORTION AFTER FIRST USE) 1 each 3  . buPROPion (WELLBUTRIN XL) 300 MG 24 hr tablet Take 300 mg daily by mouth.     . butalbital-acetaminophen-caffeine (FIORICET, ESGIC) 50-325-40 MG tablet TAKE 1 TABLET BY MOUTH EVERY 6 HOURS AS NEEDED FOR HEADACHE OR MIGRAINE 90 tablet 3  . diltiazem (CARDIZEM) 30 MG tablet  Cardizem 30mg  -- take 1 tablet by mouth every 4 hours AS NEEDED for heart rate >100 as long as blood pressure >100. (Patient taking differently: Cardizem 30mg  -- take 1 tablet by mouth every 4 hours AS NEEDED for heart rate >120 as long as blood pressure >100.) 45 tablet 3  . dorzolamide-timolol (COSOPT) 22.3-6.8 MG/ML ophthalmic solution Place 1 drop 2 (two) times daily into both eyes.   3  . ELIQUIS 5 MG TABS tablet TAKE 1 TABLET BY MOUTH TWICE DAILY 180 tablet 2  . Hypromellose (ARTIFICIAL TEARS OP) Apply 1 drop daily as needed to eye (dry eyes).    . sertraline (ZOLOFT) 100 MG tablet Take 100 mg by mouth daily with breakfast.     . Simethicone (GAS-X PO) Take 2 tablets daily as needed by mouth (gas).    . sodium chloride (OCEAN) 0.65 % SOLN nasal spray Place 1 spray as needed into both nostrils for congestion.    . SUMAtriptan (IMITREX) 100 MG tablet TAKE 1 TABLET BY MOUTH AT THE ONSET OF A HEADACHE DAILY AS NEEDED 27 tablet 3   Allergies-reviewed and updated Allergies  Allergen Reactions  . Dilaudid [Hydromorphone Hcl] Nausea And Vomiting  . Fentanyl Nausea And Vomiting  . Ciprofloxacin Rash  . Erythromycin Rash  . Percocet [Oxycodone-Acetaminophen] Nausea And Vomiting   Social History   Social History Narrative   Lives at home w/ husband and 2 dogs   Patient is right handed.   Patient drinks one cup caffeine.    ROS--Full ROS was completed Review of Systems  Constitutional: Negative for chills and fever.  HENT: Negative for hearing loss and tinnitus.   Eyes: Negative for blurred vision and double vision.  Respiratory: Positive for shortness of breath. Negative for cough.   Cardiovascular: Negative for chest pain and palpitations.  Gastrointestinal: Positive for diarrhea (intermittent after meals). Negative for heartburn and nausea.  Genitourinary: Negative for dysuria and urgency.  Musculoskeletal: Negative for myalgias and neck pain.  Skin: Negative for itching and rash.    Neurological: Negative for dizziness and headaches.  Endo/Heme/Allergies: Negative for polydipsia. Does not bruise/bleed easily.  Psychiatric/Behavioral: Negative for hallucinations and substance abuse.  Intermittent diarrhea after meals- occasional RUQ pain- up to date on colonoscopy   Objective: BP 110/62 (BP Location: Left Arm, Patient Position: Sitting, Cuff Size: Normal)   Pulse (!) 59   Temp 98.3 F (36.8 C) (Oral)   Ht 5\' 7"  (1.702 m)   Wt 152 lb 3.2 oz (69 kg)   SpO2 97%   BMI 23.84 kg/m  Gen: NAD, resting comfortably HEENT: Mucous membranes are moist. Oropharynx normal. TM normal. Visible evidence of prior nasal fracture with angulation of nose Eyes: sclera and lids normal, PERRLA Neck: no thyromegaly, no cervical  lymphadenopathy CV: RRR no murmurs rubs or gallops Lungs: CTAB no crackles, wheeze, rhonchi Abdomen: soft/nontender/nondistended/normal bowel sounds. No rebound or guarding.  Ext: no edema Skin: warm, dry Neuro: 5/5 strength in upper and lower extremities, normal gait, normal reflexes  Assessment/Plan:  COPD (chronic obstructive pulmonary disease) (HCC) S: thinks this was diagnosed on prior x-ray or scan. Also says she has had bronchitic episodes which needed antibiotics- COPD flare ups more likely to be bacterial so this is certainly possible. Winded at times- declines inhaler A/P: stable, continue to monitor- can send in albuterol if needed    Tobacco abuse S:5-12 cigarettes per day. Not ready to quit. States has to get ready.  Smoked for at least 50 years- sometimes half pack per day sometimes more than that- we estimated at 0.6 pack per year overall- which would place her at 30 pack years A/P: encouraged cessation- she is not ready to quit- she is interested in lung cancer screening program so will refer.   Review labs from records but likely get UA at least yearly with increased urological cancer risk    Paroxysmal atrial fibrillation [facility]) s/p  ablation november 2018, 2011 previously [place] November 2018 and in 2011. On eliquis for anticoagulation. cardizem as back up in case goes into a fib or flutter. Reports loop recorder in without any recurrence thankfully A/P: stable, continue current medications.   Depression, major, single episode, complete remission (HCC) S:sertraline 100mg , wellbutrin 300 mg Xl started by psychiatry Dr. Caprice Beaver- sees NP from their office- also on xanax sparingly through them- uses mainly for sleep  A/P: stable with phq9 under 5- continue psychiatry follow up and current meds    High cholesterol S: reportedly poorly controlled on no statin A/P:  Will get records but suspect will recommend statin- encouraged quitting smoking to lower CV risk as well. She states has declined statin in the past     Low vitamin D level S:Not on anything 01/2018- had gotten up to 40- bones ached on 6000 units daily- neuropathy with toes aching when on vitamin d  A/P: hopefully stable- will get records   Future Appointments  Date Time Provider [place]  01/21/2018 12:00 PM Kathrynn Ducking, MD [facility] None  01/25/2018  4:45 PM [facility] DEVICE REMOTES [facility]  06/01/2018 10:30 AM Kathrynn Ducking, MD [facility] None  12/27/2018  2:00 PM Allred, Jeneen Rinks, MD [facility]   Will mychart her for 6 month CPE  Lab/Order associations: Tobacco abuse - Plan: Ambulatory Referral for Lung Cancer Scre  Chronic obstructive pulmonary disease, unspecified COPD type ([place])  Paroxysmal atrial fibrillation [facility]) s/p ablation november 2018, 2011 previously  Depression, major, single episode, complete remission ([place])  High cholesterol  Low vitamin D level  Return precautions advised. Garret Reddish, MD

## 2018-01-08 NOTE — Assessment & Plan Note (Signed)
S: thinks this was diagnosed on prior x-ray or scan. Also says she has had bronchitic episodes which needed antibiotics- COPD flare ups more likely to be bacterial so this is certainly possible. Winded at times- declines inhaler A/P: stable, continue to monitor- can send in albuterol if needed

## 2018-01-08 NOTE — Assessment & Plan Note (Signed)
S:Not on anything 01/2018- had gotten up to 40- bones ached on 6000 units daily- neuropathy with toes aching when on vitamin d  A/P: hopefully stable- will get records

## 2018-01-08 NOTE — Patient Instructions (Addendum)
Health Maintenance Due  Topic Date Due  . Hepatitis C Screening - will look for with bloodwork [birth year]  . DEXA SCAN - need copy of this- Sign release of information at the check out desk for [facility] 11/02/2013   Sign release of information at the check out desk for last 5 years of records including labs from [facility]  We will call you within two weeks about your referral to lung cancer screening program. If you do not hear within 3 weeks, give [place] a call.   Would love for you to quit smoking- I am here for you anyway I can be to encourage you- you can do this! Have to get you there mentally first

## 2018-01-08 NOTE — Assessment & Plan Note (Signed)
S:5-12 cigarettes per day. Not ready to quit. States has to get ready.  Smoked for at least 50 years- sometimes half pack per day sometimes more than that- we estimated at 0.6 pack per year overall- which would place her at 30 pack years A/P: encouraged cessation- she is not ready to quit- she is interested in lung cancer screening program so will refer.   Review labs from records but likely get UA at least yearly with increased urological cancer risk

## 2018-01-08 NOTE — Assessment & Plan Note (Addendum)
S:Ablation November 2018 and in 2011. On eliquis for anticoagulation. cardizem as back up in case goes into a fib or flutter. Reports loop recorder in without any recurrence thankfully A/P: stable, continue current medications.

## 2018-01-08 NOTE — Assessment & Plan Note (Signed)
S:sertraline 100mg , wellbutrin 300 mg Xl started by psychiatry Dr. Caprice Beaver- sees NP from their office- also on xanax sparingly through them- uses mainly for sleep  A/P: stable with phq9 under 5- continue psychiatry follow up and current meds

## 2018-01-08 NOTE — Assessment & Plan Note (Signed)
S: reportedly poorly controlled on no statin A/P:  Will get records but suspect will recommend statin- encouraged quitting smoking to lower CV risk as well. She states has declined statin in the past

## 2018-01-11 ENCOUNTER — Telehealth: Payer: Self-pay | Admitting: *Deleted

## 2018-01-11 NOTE — Telephone Encounter (Signed)
LMOVM requesting call back to the DC. Gave direct number.  Received alert for pause episode on 01/09/18 at 0414, duration 4sec. Will ask if patient symptomatic with episode.

## 2018-01-14 ENCOUNTER — Other Ambulatory Visit: Payer: Self-pay | Admitting: Acute Care

## 2018-01-14 DIAGNOSIS — Z122 Encounter for screening for malignant neoplasm of respiratory organs: Secondary | ICD-10-CM

## 2018-01-14 DIAGNOSIS — Z87891 Personal history of nicotine dependence: Secondary | ICD-10-CM

## 2018-01-14 DIAGNOSIS — F17210 Nicotine dependence, cigarettes, uncomplicated: Principal | ICD-10-CM

## 2018-01-14 NOTE — Progress Notes (Signed)
Chest  

## 2018-01-14 NOTE — Telephone Encounter (Signed)
Dr. Rayann Heman reviewed ECG. Nocturnal pause, no changes at this time, continue to monitor.

## 2018-01-21 ENCOUNTER — Encounter: Payer: Self-pay | Admitting: Neurology

## 2018-01-21 ENCOUNTER — Ambulatory Visit (INDEPENDENT_AMBULATORY_CARE_PROVIDER_SITE_OTHER): Payer: Medicare Other | Admitting: Neurology

## 2018-01-21 VITALS — BP 129/71 | HR 59 | Ht 67.0 in | Wt 155.5 lb

## 2018-01-21 DIAGNOSIS — G43719 Chronic migraine without aura, intractable, without status migrainosus: Secondary | ICD-10-CM

## 2018-01-21 NOTE — Procedures (Signed)
     BOTOX PROCEDURE NOTE FOR MIGRAINE HEADACHE   HISTORY: Suzanne [name] is a 67 year old patient with a history of intractable migraine headache.  She has responded quite well to the Botox treatments.  She returns for another therapy.  She did have a severe headache around Thanksgiving of 2019.   Description of procedure:  The patient was placed in a sitting position. The standard protocol was used for Botox as follows, with 5 units of Botox injected at each site:   -Procerus muscle, midline injection  -Corrugator muscle, bilateral injection  -Frontalis muscle, bilateral injection, with 2 sites each side, medial injection was performed in the upper one third of the frontalis muscle, in the region vertical from the medial inferior edge of the superior orbital rim. The lateral injection was again in the upper one third of the forehead vertically above the lateral limbus of the cornea, 1.5 cm lateral to the medial injection site.  -Temporalis muscle injection, 4 sites, bilaterally. The first injection was 3 cm above the tragus of the ear, second injection site was 1.5 cm to 3 cm up from the first injection site in line with the tragus of the ear. The third injection site was 1.5-3 cm forward between the first 2 injection sites. The fourth injection site was 1.5 cm posterior to the second injection site.  -Occipitalis muscle injection, 3 sites, bilaterally. The first injection was done one half way between the occipital protuberance and the tip of the mastoid process behind the ear. The second injection site was done lateral and superior to the first, 1 fingerbreadth from the first injection. The third injection site was 1 fingerbreadth superiorly and medially from the first injection site.  -Cervical paraspinal muscle injection, 2 sites, bilateral, the first injection site was 1 cm from the midline of the cervical spine, 3 cm inferior to the lower border of the occipital protuberance. The  second injection site was 1.5 cm superiorly and laterally to the first injection site.  -Trapezius muscle injection was performed at 3 sites, bilaterally. The first injection site was in the upper trapezius muscle halfway between the inflection point of the neck, and the acromion. The second injection site was one half way between the acromion and the first injection site. The third injection was done between the first injection site and the inflection point of the neck.   A 200 unit bottle of Botox was used, 155 units were injected, the rest of the Botox was wasted. The patient tolerated the procedure well, there were no complications of the above procedure.  Botox NDC 1610-9604-54 Lot number [id number] Expiration date April 2022

## 2018-01-25 ENCOUNTER — Ambulatory Visit (INDEPENDENT_AMBULATORY_CARE_PROVIDER_SITE_OTHER): Payer: Medicare Other

## 2018-01-25 DIAGNOSIS — I480 Paroxysmal atrial fibrillation: Secondary | ICD-10-CM | POA: Diagnosis not present

## 2018-01-26 LAB — CUP PACEART REMOTE DEVICE CHECK
Date Time Interrogation Session: 20191224024010
MDC IDC PG IMPLANT DT: 20180830

## 2018-01-28 NOTE — Progress Notes (Signed)
Carelink Summary Report / Loop Recorder 

## 2018-02-01 ENCOUNTER — Other Ambulatory Visit: Payer: Self-pay | Admitting: Internal Medicine

## 2018-02-01 MED FILL — DORZOLAMIDE-TIMOLOL EYE DRP: 22.3-6.8 | 50 days supply | Qty: 10 | Fill #0

## 2018-02-07 LAB — CUP PACEART REMOTE DEVICE CHECK
Date Time Interrogation Session: 20191121024102
Implantable Pulse Generator Implant Date: 20180830

## 2018-02-08 ENCOUNTER — Encounter: Payer: Self-pay | Admitting: Acute Care

## 2018-02-08 ENCOUNTER — Ambulatory Visit (INDEPENDENT_AMBULATORY_CARE_PROVIDER_SITE_OTHER)
Admission: RE | Admit: 2018-02-08 | Discharge: 2018-02-08 | Disposition: A | Discharge status: Home / Self Care | Payer: Medicare Other | Source: Ambulatory Visit | Attending: Acute Care | Admitting: Acute Care

## 2018-02-08 ENCOUNTER — Ambulatory Visit (INDEPENDENT_AMBULATORY_CARE_PROVIDER_SITE_OTHER): Payer: Medicare Other | Admitting: Acute Care

## 2018-02-08 VITALS — BP 128/88 | HR 72 | Ht 67.0 in | Wt 158.0 lb

## 2018-02-08 DIAGNOSIS — F17210 Nicotine dependence, cigarettes, uncomplicated: Secondary | ICD-10-CM | POA: Diagnosis not present

## 2018-02-08 DIAGNOSIS — Z87891 Personal history of nicotine dependence: Secondary | ICD-10-CM | POA: Diagnosis not present

## 2018-02-08 DIAGNOSIS — Z122 Encounter for screening for malignant neoplasm of respiratory organs: Secondary | ICD-10-CM

## 2018-02-08 NOTE — Progress Notes (Signed)
Shared Decision Making Visit Lung Cancer Screening Program [phone])   Eligibility:  Age 68 y.o.  Pack Years Smoking History Calculation 38 pack year smoking history (# packs/per year x # years smoked)  Recent History of coughing up blood  no  Unexplained weight loss? no ( >Than 15 pounds within the last 6 months )  Prior History Lung / other cancer no (Diagnosis within the last 5 years already requiring surveillance chest CT Scans).  Smoking Status Current Every Day Smoker  Former Smokers: Years since quit: NA  Quit Date: NA  Visit Components:  Discussion included one or more decision making aids. yes  Discussion included risk/benefits of screening. yes  Discussion included potential follow up diagnostic testing for abnormal scans. yes  Discussion included meaning and risk of over diagnosis. yes  Discussion included meaning and risk of False Positives. yes  Discussion included meaning of total radiation exposure. yes  Counseling Included:  Importance of adherence to annual lung cancer LDCT screening. yes  Impact of comorbidities on ability to participate in the program. yes  Ability and willingness to under diagnostic treatment. yes  Smoking Cessation Counseling:  Current Smokers:   Discussed importance of smoking cessation. yes  Information about tobacco cessation classes and interventions provided to patient. yes  Patient provided with "ticket" for LDCT Scan. yes  Symptomatic Patient. no  Counseling  Diagnosis Code: Tobacco Use Z72.0  Asymptomatic Patient yes  Counseling (Intermediate counseling: > three minutes counseling) D7412  Former Smokers:   Discussed the importance of maintaining cigarette abstinence. yes  Diagnosis Code: Personal History of Nicotine Dependence. I78.676  Information about tobacco cessation classes and interventions provided to patient. Yes  Patient provided with "ticket" for LDCT Scan. yes  Written Order for Lung  Cancer Screening with LDCT placed in Epic. Yes (CT Chest Lung Cancer Screening Low Dose W/O CM) [id number] Z12.2-Screening of respiratory organs Z87.891-Personal history of nicotine dependence  I have spent 25 minutes of face to face time with Ms. Holzhauer discussing the risks and benefits of lung cancer screening. We viewed a power point together that explained in detail the above noted topics. We paused at intervals to allow for questions to be asked and answered to ensure understanding.We discussed that the single most powerful action that she can take to decrease her risk of developing lung cancer is to quit smoking. We discussed whether or not she is ready to commit to setting a quit date. She is currently not ready to set a quit date. We discussed options for tools to aid in quitting smoking including nicotine replacement therapy, non-nicotine medications, support groups, Quit Smart classes, and behavior modification. We discussed that often times setting smaller, more achievable goals, such as eliminating 1 cigarette a day for a week and then 2 cigarettes a day for a week can be helpful in slowly decreasing the number of cigarettes smoked. This allows for a sense of accomplishment as well as providing a clinical benefit. I gave her the " Be Stronger Than Your Excuses" card with contact information for community resources, classes, free nicotine replacement therapy, and access to mobile apps, text messaging, and on-line smoking cessation help. I have also given her my card and contact information in the event she needs to contact me. We discussed the time and location of the scan, and that either Doroteo Glassman RN or I will call with the results within 24-48 hours of receiving them.The patient verbalized understanding of all of  the above and had no  further questions upon leaving the office. They have my contact information in the event they have any further questions.  I spent 5 minutes counseling on  smoking cessation and the health risks of continued tobacco abuse.  I explained to the patient that there has been a high incidence of coronary artery disease noted on these exams. I explained that this is a non-gated exam therefore degree or severity cannot be determined. This patient is not on statin therapy. I have asked the patient to follow-up with their PCP regarding any incidental finding of coronary artery disease and management with diet or medication as their PCP  feels is clinically indicated. The patient verbalized understanding of the above and had no further questions upon completion of the visit.      Magdalen Spatz, NP 02/08/2018 4:26 PM

## 2018-02-10 ENCOUNTER — Other Ambulatory Visit: Payer: Self-pay | Admitting: Acute Care

## 2018-02-10 DIAGNOSIS — F17210 Nicotine dependence, cigarettes, uncomplicated: Principal | ICD-10-CM

## 2018-02-10 DIAGNOSIS — Z122 Encounter for screening for malignant neoplasm of respiratory organs: Secondary | ICD-10-CM

## 2018-02-10 DIAGNOSIS — Z87891 Personal history of nicotine dependence: Secondary | ICD-10-CM

## 2018-03-01 ENCOUNTER — Ambulatory Visit (INDEPENDENT_AMBULATORY_CARE_PROVIDER_SITE_OTHER): Payer: Medicare Other

## 2018-03-01 DIAGNOSIS — I480 Paroxysmal atrial fibrillation: Secondary | ICD-10-CM

## 2018-03-02 LAB — CUP PACEART REMOTE DEVICE CHECK
Date Time Interrogation Session: 20200126030830
MDC IDC PG IMPLANT DT: 20180830

## 2018-03-02 NOTE — Progress Notes (Signed)
Carelink Summary Report / Loop Recorder 

## 2018-03-03 ENCOUNTER — Other Ambulatory Visit: Payer: Self-pay | Admitting: Neurology

## 2018-03-03 MED FILL — BUTALB-ACETAMIN-CAFF 50-325: 50-325-40 | 22 days supply | Qty: 90 | Fill #0

## 2018-03-03 MED FILL — DICLOFENAC SODIUM 1 % GEL: 1 | 37 days supply | Qty: 300 | Fill #1

## 2018-03-03 MED FILL — SUMATRIPTAN SUCC 100 MG TAB: 100 | 90 days supply | Qty: 27 | Fill #1

## 2018-03-11 MED FILL — ALPRAZolam 0.5 MG TABS: 0.5 | 30 days supply | Qty: 60 | Fill #0

## 2018-04-01 ENCOUNTER — Ambulatory Visit (INDEPENDENT_AMBULATORY_CARE_PROVIDER_SITE_OTHER): Payer: Medicare Other | Admitting: *Deleted

## 2018-04-01 DIAGNOSIS — I480 Paroxysmal atrial fibrillation: Secondary | ICD-10-CM | POA: Diagnosis not present

## 2018-04-02 LAB — CUP PACEART REMOTE DEVICE CHECK
Date Time Interrogation Session: 20200228030826
MDC IDC PG IMPLANT DT: 20180830

## 2018-04-02 MED FILL — ELIQUIS 5 MG TABLET: 5 | 90 days supply | Qty: 180 | Fill #1

## 2018-04-02 MED FILL — buPROPion HCL ER (XL) 300 M: 300 | 90 days supply | Qty: 90 | Fill #0

## 2018-04-02 MED FILL — SERTRALINE HCL 100 MG TAB: 100 | 90 days supply | Qty: 90 | Fill #0

## 2018-04-05 ENCOUNTER — Telehealth: Payer: Self-pay

## 2018-04-05 NOTE — Telephone Encounter (Signed)
Received PA approval for Botox. Ref # [id number] Effective dates 04/05/18- 07/01/18.

## 2018-04-06 MED FILL — DORZOLAMIDE-TIMOLOL EYE DRP: 22.3-6.8 | 90 days supply | Qty: 20 | Fill #0

## 2018-04-06 NOTE — Telephone Encounter (Signed)
Noted, thank you

## 2018-04-07 NOTE — Progress Notes (Signed)
Carelink Summary Report / Loop Recorder 

## 2018-04-09 ENCOUNTER — Encounter: Payer: Self-pay | Admitting: Family Medicine

## 2018-04-09 NOTE — Telephone Encounter (Signed)
Please see message. °

## 2018-04-22 MED FILL — BUTALB-ACETAMIN-CAFF 50-325: 50-325-40 | 22 days supply | Qty: 90 | Fill #1

## 2018-04-22 MED FILL — buPROPion HCL ER (XL) 300 M: 300 | 90 days supply | Qty: 90 | Fill #0

## 2018-04-22 MED FILL — DICLOFENAC SODIUM 1 % GEL: 1 | 37 days supply | Qty: 300 | Fill #2

## 2018-04-22 MED FILL — ALPRAZolam 0.5 MG TABS: 0.5 | 90 days supply | Qty: 180 | Fill #0

## 2018-04-22 MED FILL — SERTRALINE HCL 100 MG TAB: 100 | 90 days supply | Qty: 90 | Fill #0

## 2018-04-23 ENCOUNTER — Ambulatory Visit (INDEPENDENT_AMBULATORY_CARE_PROVIDER_SITE_OTHER): Payer: Medicare Other | Admitting: Neurology

## 2018-04-23 ENCOUNTER — Encounter: Payer: Self-pay | Admitting: Neurology

## 2018-04-23 ENCOUNTER — Other Ambulatory Visit: Payer: Self-pay

## 2018-04-23 VITALS — BP 106/70 | HR 62 | Ht 67.0 in | Wt 156.0 lb

## 2018-04-23 DIAGNOSIS — G43719 Chronic migraine without aura, intractable, without status migrainosus: Secondary | ICD-10-CM

## 2018-04-23 NOTE — Procedures (Signed)
     BOTOX PROCEDURE NOTE FOR MIGRAINE HEADACHE   HISTORY: Suzanne [name] is a 68 year old patient with a history of intractable migraine headache.  The patient comes in for another Botox injection, she clearly gains benefit with the drug, she does wear off with the effects of the medication several weeks prior to the next injection.   Description of procedure:  The patient was placed in a sitting position. The standard protocol was used for Botox as follows, with 5 units of Botox injected at each site:   -Procerus muscle, midline injection  -Corrugator muscle, bilateral injection  -Frontalis muscle, bilateral injection, with 2 sites each side, medial injection was performed in the upper one third of the frontalis muscle, in the region vertical from the medial inferior edge of the superior orbital rim. The lateral injection was again in the upper one third of the forehead vertically above the lateral limbus of the cornea, 1.5 cm lateral to the medial injection site.  -Temporalis muscle injection, 4 sites, bilaterally. The first injection was 3 cm above the tragus of the ear, second injection site was 1.5 cm to 3 cm up from the first injection site in line with the tragus of the ear. The third injection site was 1.5-3 cm forward between the first 2 injection sites. The fourth injection site was 1.5 cm posterior to the second injection site.  -Occipitalis muscle injection, 3 sites, bilaterally. The first injection was done one half way between the occipital protuberance and the tip of the mastoid process behind the ear. The second injection site was done lateral and superior to the first, 1 fingerbreadth from the first injection. The third injection site was 1 fingerbreadth superiorly and medially from the first injection site.  -Cervical paraspinal muscle injection, 2 sites, bilateral, the first injection site was 1 cm from the midline of the cervical spine, 3 cm inferior to the lower border  of the occipital protuberance. The second injection site was 1.5 cm superiorly and laterally to the first injection site.  -Trapezius muscle injection was performed at 3 sites, bilaterally. The first injection site was in the upper trapezius muscle halfway between the inflection point of the neck, and the acromion. The second injection site was one half way between the acromion and the first injection site. The third injection was done between the first injection site and the inflection point of the neck.   A 200 unit bottle of Botox was used, 155 units were injected, the rest of the Botox was wasted. The patient tolerated the procedure well, there were no complications of the above procedure.  Botox NDC 1438-8875-79 Lot number [id number] Expiration date August 2022

## 2018-04-23 NOTE — Progress Notes (Signed)
Please refer to Botox procedure note. 

## 2018-04-26 ENCOUNTER — Telehealth: Payer: Self-pay | Admitting: Neurology

## 2018-04-26 NOTE — Telephone Encounter (Signed)
I called the patient to schedule her next Botox injection. She did not answer so I left her VM asking her to call back. I went ahead and made an apt for June. If she calls back please verify this apt works for her.

## 2018-05-03 ENCOUNTER — Ambulatory Visit: Payer: Medicare Other | Admitting: Family Medicine

## 2018-05-04 ENCOUNTER — Ambulatory Visit (INDEPENDENT_AMBULATORY_CARE_PROVIDER_SITE_OTHER): Payer: Medicare Other | Admitting: *Deleted

## 2018-05-04 ENCOUNTER — Other Ambulatory Visit: Payer: Self-pay

## 2018-05-04 DIAGNOSIS — I480 Paroxysmal atrial fibrillation: Secondary | ICD-10-CM | POA: Diagnosis not present

## 2018-05-05 LAB — CUP PACEART REMOTE DEVICE CHECK
Date Time Interrogation Session: 20200401034231
Implantable Pulse Generator Implant Date: 20180830

## 2018-05-12 NOTE — Progress Notes (Signed)
Carelink Summary Report / Loop Recorder 

## 2018-05-31 ENCOUNTER — Telehealth: Payer: Self-pay

## 2018-05-31 NOTE — Telephone Encounter (Signed)
I contacted the pt in regards to her 06/01/18 appt scheduled for 10 am.  I left a vm for the pt to call me back.  Pt can be offered a video visit and then telephone if she does not have video capability.  Due to current COVID 19 pandemic, our office is severely reducing in office visits for at least the next 2 weeks, in order to minimize the risk to our patients and healthcare providers.

## 2018-05-31 NOTE — Telephone Encounter (Signed)
Pre charting for 06/01/18 telephone visit has been completed. Pt's meds, allergies PMH have been updated.   Pt is schedule for her yearly f/u and reports migraines have been well controled on botox. Pt report weight as 152 lb, BP 110/74 and HR of 70.   Pt understands that although there may be some limitations with this type of visit, we will take all precautions to reduce any security or privacy concerns.  Pt understands that this will be treated like an in office visit and we will file with pt's insurance, and there may be a patient responsible charge related to this service.  Best call back # is [phone].

## 2018-05-31 NOTE — Telephone Encounter (Signed)
Pt caled in and gave consent for tele visit, she states she feels more comfortable

## 2018-05-31 NOTE — Telephone Encounter (Signed)
Lvm for pt to  return my call so we could complete pre charting for video visit.

## 2018-05-31 NOTE — Addendum Note (Signed)
Addended by: Verlin Grills T on: 05/31/2018 03:45 PM   Modules accepted: Orders

## 2018-05-31 NOTE — Telephone Encounter (Signed)
Noted, thanks!

## 2018-06-01 ENCOUNTER — Other Ambulatory Visit: Payer: Self-pay

## 2018-06-01 ENCOUNTER — Ambulatory Visit (INDEPENDENT_AMBULATORY_CARE_PROVIDER_SITE_OTHER): Payer: Medicare Other | Admitting: Neurology

## 2018-06-01 ENCOUNTER — Encounter: Payer: Self-pay | Admitting: Neurology

## 2018-06-01 DIAGNOSIS — G43719 Chronic migraine without aura, intractable, without status migrainosus: Secondary | ICD-10-CM | POA: Diagnosis not present

## 2018-06-01 MED ORDER — PROMETHAZINE HCL 25 MG PO TABS: 25.0000 mg | ORAL_TABLET | Freq: Four times a day (QID) | ORAL | 1 refills | Status: DC | PRN

## 2018-06-01 MED FILL — PROMETHAZINE 25 MG TABLET: 25 | 5 days supply | Qty: 20 | Fill #0

## 2018-06-01 NOTE — Progress Notes (Signed)
     Virtual Visit via Telephone Note  I connected with Suzanne [name] on 06/01/18 at 10:30 AM EDT by telephone and verified that I am speaking with the correct person using two identifiers.   I discussed the limitations, risks, security and privacy concerns of performing an evaluation and management service by telephone and the availability of in person appointments. I also discussed with the patient that there may be a patient responsible charge related to this service. The patient expressed understanding and agreed to proceed.  The patient is at home, physician in the office.   History of Present Illness: Suzanne [name] is a 68 year old right-handed white female with history of migraine headache.  The patient is on Botox therapy, she has gained good improvement with the headaches.  She still has some occasional headaches that she requires Imitrex for, but her headaches never become extremely severe, they may get to a level of 2 through 4, but never more severe than that.  At times she may have nausea, she no longer has Phenergan to take.  The patient overall is pleased with her current therapy.  The Imitrex is still effective.  She has Fioricet to take as well if needed.  She is due for her next Botox injection in June 2020.   Observations/Objective: WebEx evaluation reveals that the patient is alert and cooperative.  The patient is able to generate good full extraocular movements.  Speech is well enunciated, not aphasic or dysarthric.  She protrude the tongue in the midline with good lateral movement of the tongue.  The patient has symmetric face.  The patient has good full range of motion of the cervical spine.  She is able to ambulate normally, tandem gait is normal.  Romberg is negative.  She has good finger-nose-finger bilaterally.  Assessment and Plan: 1.  Intractable migraine headache  The patient will be given a prescription for Phenergan.  She will remain on the Botox therapy for  now.  She will contact me if she has any other concerns.  She will follow-up in 1 year.  Follow Up Instructions: 1 year follow-up, may see nurse practitioner.   I discussed the assessment and treatment plan with the patient. The patient was provided an opportunity to ask questions and all were answered. The patient agreed with the plan and demonstrated an understanding of the instructions.   The patient was advised to call back or seek an in-person evaluation if the symptoms worsen or if the condition fails to improve as anticipated.  I provided 20 minutes of non-face-to-face time during this encounter.   Kathrynn Ducking, MD

## 2018-06-07 ENCOUNTER — Ambulatory Visit (INDEPENDENT_AMBULATORY_CARE_PROVIDER_SITE_OTHER): Payer: Medicare Other | Admitting: *Deleted

## 2018-06-07 ENCOUNTER — Other Ambulatory Visit: Payer: Self-pay

## 2018-06-07 DIAGNOSIS — I495 Sick sinus syndrome: Secondary | ICD-10-CM

## 2018-06-07 LAB — CUP PACEART REMOTE DEVICE CHECK
Date Time Interrogation Session: 20200504104134
Implantable Pulse Generator Implant Date: 20180830

## 2018-06-14 NOTE — Progress Notes (Signed)
Carelink Summary Report / Loop Recorder 

## 2018-06-30 MED FILL — ELIQUIS 5 MG TABLET: 5 | 90 days supply | Qty: 180 | Fill #2

## 2018-06-30 MED FILL — DORZOLAMIDE-TIMOLOL EYE DRP: 22.3-6.8 | 90 days supply | Qty: 20 | Fill #1

## 2018-07-07 ENCOUNTER — Other Ambulatory Visit: Payer: Self-pay

## 2018-07-07 ENCOUNTER — Ambulatory Visit (INDEPENDENT_AMBULATORY_CARE_PROVIDER_SITE_OTHER): Payer: Medicare Other | Admitting: Family Medicine

## 2018-07-07 ENCOUNTER — Encounter: Payer: Self-pay | Admitting: Family Medicine

## 2018-07-07 VITALS — BP 120/74 | HR 68 | Temp 98.7°F | Ht 67.0 in | Wt 157.2 lb

## 2018-07-07 DIAGNOSIS — Z0000 Encounter for general adult medical examination without abnormal findings: Secondary | ICD-10-CM

## 2018-07-07 DIAGNOSIS — E7800 Pure hypercholesterolemia, unspecified: Secondary | ICD-10-CM

## 2018-07-07 DIAGNOSIS — R7989 Other specified abnormal findings of blood chemistry: Secondary | ICD-10-CM

## 2018-07-07 LAB — POC URINALSYSI DIPSTICK (AUTOMATED)
Bilirubin, UA: NEGATIVE
Blood, UA: NEGATIVE
Glucose, UA: NEGATIVE
Ketones, UA: NEGATIVE
Leukocytes, UA: NEGATIVE
Nitrite, UA: NEGATIVE
Protein, UA: NEGATIVE
Spec Grav, UA: 1.015 (ref 1.010–1.025)
Urobilinogen, UA: 0.2 E.U./dL
pH, UA: 7.5 (ref 5.0–8.0)

## 2018-07-07 LAB — COMPREHENSIVE METABOLIC PANEL
ALT: 10 U/L (ref 0–35)
AST: 12 U/L (ref 0–37)
Albumin: 4.3 g/dL (ref 3.5–5.2)
Alkaline Phosphatase: 77 U/L (ref 39–117)
BUN: 14 mg/dL (ref 6–23)
CO2: 29 mEq/L (ref 19–32)
Calcium: 9.5 mg/dL (ref 8.4–10.5)
Chloride: 102 mEq/L (ref 96–112)
Creatinine, Ser: 0.77 mg/dL (ref 0.40–1.20)
GFR: 74.18 mL/min (ref >60.00)
Glucose, Bld: 92 mg/dL (ref 70–99)
Potassium: 4.4 mEq/L (ref 3.5–5.1)
Sodium: 136 mEq/L (ref 135–145)
Total Bilirubin: 0.5 mg/dL (ref 0.2–1.2)
Total Protein: 6.4 g/dL (ref 6.0–8.3)

## 2018-07-07 LAB — LIPID PANEL
Cholesterol: 283 mg/dL — ABNORMAL HIGH (ref 0–200)
HDL: 69 mg/dL (ref >39.00)
LDL Cholesterol: 187 mg/dL — ABNORMAL HIGH (ref 0–99)
NonHDL: 214.17
Total CHOL/HDL Ratio: 4
Triglycerides: 137 mg/dL (ref 0.0–149.0)
VLDL: 27.4 mg/dL (ref 0.0–40.0)

## 2018-07-07 LAB — CBC
HCT: 41 % (ref 36.0–46.0)
Hemoglobin: 13.8 g/dL (ref 12.0–15.0)
MCHC: 33.7 g/dL (ref 30.0–36.0)
MCV: 96.1 fl (ref 78.0–100.0)
Platelets: 189 10*3/uL (ref 150.0–400.0)
RBC: 4.27 Mil/uL (ref 3.87–5.11)
RDW: 12.8 % (ref 11.5–15.5)
WBC: 5.5 10*3/uL (ref 4.0–10.5)

## 2018-07-07 LAB — VITAMIN D 25 HYDROXY (VIT D DEFICIENCY, FRACTURES): VITD: 29.79 ng/mL — ABNORMAL LOW (ref 30.00–100.00)

## 2018-07-07 MED ORDER — ALBUTEROL SULFATE HFA 108 (90 BASE) MCG/ACT IN AERS: 2.0000 | INHALATION_SPRAY | Freq: Four times a day (QID) | RESPIRATORY_TRACT | 2 refills | Status: DC | PRN

## 2018-07-07 MED FILL — PROAIR HFA 90 MCG INHALER: 108 (90 BAS | 25 days supply | Qty: 9 | Fill #0

## 2018-07-07 NOTE — Progress Notes (Signed)
Phone: [phone]   Subjective:  Patient presents today for their annual physical. Chief complaint-noted.   See problem oriented charting- ROS- full  review of systems was completed and negative except for: eye itching, wheezing, diarrhea, joint pain in L knee, seasonal allergies  The following were reviewed and entered/updated in epic: Past Medical History:  Diagnosis Date  . Anxiety   . Asthma   . Atrial fibrillation ([place])    s/p ablations  . Atrial flutter ([place])   . COPD (chronic obstructive pulmonary disease) ([place])   . Degenerative arthritis    "maybe in my left knee" (12/16/2016)  . Depression   . Frozen shoulder    hx bilaterally; "had to go thru PT; no problem w/them since I learned exercises" (12/16/2016)  . GERD (gastroesophageal reflux disease)   . Glaucoma   . H/O: rheumatic fever 1957  . High cholesterol   . Migraine    "getting botox now; now only 2-3/month" (12/16/2016)  . PONV (postoperative nausea and vomiting)    with Fentanyl,  Dilaudid &  Propofol (12/16/2016)   Patient Active Problem List   Diagnosis Date Noted  . Paroxysmal atrial fibrillation [facility]) s/p ablation november 2018, 2011 previously 12/16/2016    Priority: High  . Tobacco abuse 10/03/2013    Priority: High  . COPD (chronic obstructive pulmonary disease) ([facility]) 02/01/2009    Priority: High  . Glaucoma 01/08/2018    Priority: Medium  . Osteoarthritis of left knee 01/08/2018    Priority: Medium  . High cholesterol     Priority: Medium  . Intractable chronic migraine without aura 05/17/2014    Priority: Medium  . Depression, major, single episode, complete remission ([place]) 02/01/2009    Priority: Medium  . Asthma 02/01/2009    Priority: Medium  . Low vitamin D level 01/08/2018    Priority: Low  . Nasal injury 10/20/2016    Priority: Low  . S/P ablation of atrial fibrillation 05/16/2013    Priority: Low   Past Surgical History:  Procedure Laterality Date  . ABDOMINAL HYSTERECTOMY   1990   right ovary removed  . APPENDECTOMY  1990  . ATRIAL FIBRILLATION ABLATION N/A 12/16/2016   Procedure: ATRIAL FIBRILLATION ABLATION;  Surgeon: Thompson Grayer, MD;  Location: [facility];  Service: Cardiovascular;  Laterality: N/A;  . CATARACT EXTRACTION, BILATERAL    . CTI ablation  3/11  . ESOPHAGOGASTRODUODENOSCOPY (EGD) WITH PROPOFOL N/A 01/06/2013   Procedure: ESOPHAGOGASTRODUODENOSCOPY (EGD) WITH PROPOFOL;  Surgeon: Cleotis Nipper, MD;  Location: [facility];  Service: Endoscopy;  Laterality: N/A;  . KNEE ARTHROSCOPY Bilateral 1999-2006  . KNEE SURGERY Bilateral 8416,6063   "open; for congenital deformity"  . LOOP RECORDER INSERTION N/A 10/02/2016   Procedure: LOOP RECORDER INSERTION;  Surgeon: Thompson Grayer, MD;  Location: [facility];  Service: Cardiovascular;  Laterality: N/A;  . PERIPHERAL VASCULAR INTERVENTION  2/11  & 6/11  . RHINOPLASTY  1965  . TONSILLECTOMY  as child  . TOOTH EXTRACTION  2009   2 teeth extractted    Family History  Problem Relation Age of Onset  . Cancer Mother        renal cell  . Migraines Mother   . Dementia Father   . Heart disease Father        mid 78s  . Other Brother        died 41- out for run- no autopsy  . Hyperlipidemia Brother   . Breast cancer Neg Hx  Medications- reviewed and updated Current Outpatient Medications  Medication Sig Dispense Refill  . acetaminophen (TYLENOL) 325 MG tablet Take 325 mg 2 (two) times daily as needed by mouth (pain).     Marland Kitchen ALPRAZolam (XANAX) 0.5 MG tablet Take 0.5 mg at bedtime as needed by mouth for anxiety or sleep. Anxiety or sleep    . BOTOX 200 units SOLR INJECT 200 UNITS INTRAMUSCULARLY EVERY 3 MONTHS (GIVEN AT MD OFFICE, DISCARD UNUSED PORTION AFTER FIRST USE) 1 each 3  . buPROPion (WELLBUTRIN XL) 300 MG 24 hr tablet Take 300 mg daily by mouth.     . butalbital-acetaminophen-caffeine (FIORICET, ESGIC) 50-325-40 MG tablet TAKE 1 TABLET BY MOUTH EVERY 6 HOURS AS NEEDED FOR  HEADACHE OR MIGRAINE 90 tablet 3  . diclofenac sodium (VOLTAREN) 1 % GEL Apply topically 4 (four) times daily.    . dorzolamide-timolol (COSOPT) 22.3-6.8 MG/ML ophthalmic solution Place 1 drop 2 (two) times daily into both eyes.   3  . ELIQUIS 5 MG TABS tablet TAKE 1 TABLET BY MOUTH TWICE DAILY 180 tablet 2  . Hypromellose (ARTIFICIAL TEARS OP) Apply 1 drop daily as needed to eye (dry eyes).    . promethazine (PHENERGAN) 25 MG tablet Take 1 tablet (25 mg total) by mouth every 6 (six) hours as needed for nausea or vomiting. 20 tablet 1  . sertraline (ZOLOFT) 100 MG tablet Take 100 mg by mouth daily with breakfast.     . Simethicone (GAS-X PO) Take 2 tablets daily as needed by mouth (gas).    . sodium chloride (OCEAN) 0.65 % SOLN nasal spray Place 1 spray as needed into both nostrils for congestion.    . SUMAtriptan (IMITREX) 100 MG tablet TAKE 1 TABLET BY MOUTH AT THE ONSET OF A HEADACHE DAILY AS NEEDED 27 tablet 3  . albuterol (VENTOLIN HFA) 108 (90 Base) MCG/ACT inhaler Inhale 2 puffs into the lungs every 6 (six) hours as needed for wheezing or shortness of breath. 1 Inhaler 2   No current facility-administered medications for this visit.     Allergies-reviewed and updated Allergies  Allergen Reactions  . Dilaudid [Hydromorphone Hcl] Nausea And Vomiting  . Fentanyl Nausea And Vomiting  . Ciprofloxacin Rash  . Erythromycin Rash  . Percocet [Oxycodone-Acetaminophen] Nausea And Vomiting    Social History   Social History Narrative   Lives at home w/ husband and 2 dogs   Patient is right handed.   Patient drinks one cup caffeine.   Objective  Objective:  BP 120/74 (BP Location: Left Arm, Patient Position: Sitting, Cuff Size: Normal)   Pulse 68   Temp 98.7 F (37.1 C) (Oral)   Ht 5\' 7"  (1.702 m)   Wt 157 lb 4 oz (71.3 kg)   SpO2 95%   BMI 24.63 kg/m  Gen: NAD, resting comfortably HEENT: Mucous membranes are moist. Oropharynx normal Neck: no thyromegaly CV: RRR no murmurs  rubs or gallops Lungs: CTAB no crackles, wheeze, rhonchi Abdomen: soft/nontender/nondistended/normal bowel sounds. No rebound or guarding.  Ext: no edema Skin: warm, dry Neuro: grossly normal, moves all extremities, PERRLA   Assessment and Plan    68 y.o. female presenting for annual physical.  Health Maintenance counseling: 1. Anticipatory guidance: Patient counseled regarding regular dental exams -q6 months in general, eye exams - tomorrow planned,  avoiding smoking and second hand smoke- see below discussion , limiting alcohol to 1 beverage per day- doesn't drink - can trigger migraines- may have a sip.   2. Risk factor reduction:  Advised patient of need for regular exercise and diet rich and fruits and vegetables to reduce risk of heart attack and stroke. Exercise- hesitant to walk as a lot of people out and she is worried about getting too close- walks around the pool to try to get some steps but harder to get motivated to this (thinks she can get in today). Diet-eating reasonable diet- feels like movement is the big thing. Reports some weight gain with covid 19- up 5 lbs from max 3. Immunizations/screenings/ancillary studies- HCV has been done- didn't get records- we will just say completed at this point.  Immunization History  Administered Date(s) Administered  . Influenza, High Dose Seasonal PF 11/08/2013, 10/10/2016  . Influenza,inj,quad, With Preservative 11/07/2014  . Influenza-Unspecified 10/12/2016, 10/29/2017  . Pneumococcal Conjugate-13 03/27/2016  . Pneumococcal Polysaccharide-23 12/20/2013  . Tdap 02/02/2013  4. Cervical cancer screening- seeing Laurin Coder on firday and they are not doing pap smears but still doing bimanual exam- monitoring remaining ovary 5. Breast cancer screening-  breast exam - with [place]- and mammogram 05/14/17- now due for repeat based on 1 year guidelines- she is considering stretching to 2 years but wants to talk to beth lane as well 6. Colon  cancer screening - 05/23/2013 with 10 year repeat 7. Skin cancer screening- saw dermatologist within last year- struggling to remember name. advised regular sunscreen use. Denies worrisome, changing, or new skin lesions.  8. Birth control/STD check- monogomous and postmenopausal 9. Osteoporosis screening at 66- seeing Laurin Coder with GYN on firday and they will discuss bone density- apparently has had osteoporosis- worried about risks of medications. She will have them send [place] a copy of last bone density.  10. current smoker- in lung cancer screen program. Will get ua.   Status of chronic or acute concerns   #COPD/asthma (may have been early copd)- recently doing well without issues- states gets copid flare ups at times requiring antibiotics but no recent need - some wheezing this spring- worse than it has been before.  - sent in albuterol inhaler for her to have on hand if has flare ups- she is trying to avoid allergens which can trigger her but difficult at this time of the year.    # allergies seasonal- discussed could try otc antihistamine like allegra/claritin  #sees eye doctor tomorrow- some eye itching from allerigies  # Current smoker- still doing 5-12 cigarettes per day. Not ready to quit but strongly encouraged cessation. Referred to lung cancer screening program last visit - she had that in January. Right now is not the best time for her with covid 19- she is actually pleased has not gone up more in light of current circumstances  # atrial fibrillation s/p ablation nov 2018- on eliquis for anticoagulation, cardizem as back up in case goes into a fib or flutter. Stable- continue current medications. Still has loop recorder in place- no bad reports.   #Depression- sees psychiatric NP- on sertraline 100mg  and wellbutrin 300 mg- states doing well   # HLD- will update lipid panel and 10 year ascvd risk. Also has aortic atherosclerosis- discussed this would make me lean toward statin as  well.  - very hesitant about statins- brothers had bad myalgias. One brother did tolerate fish oil- other brother died.  - she would want to try fish oil or vascepa  #vitamin D deficiency- has aching toes when on vitamin D in the past. With osteoporosis would be very important to monitor so agrees to this  today  #headaches- follows with Dr. Jannifer Franklin and has upcoming appointment hcv   # urgency and diarrhea at times. No blood in stool or melena. On and off issues since 2014. May have mild IBS Future Appointments  Date Time Provider [place]  07/12/2018  8:50 AM [facility] DEVICE REMOTES [facility]  07/30/2018 12:00 PM Kathrynn Ducking, MD [facility] None  12/27/2018  2:00 PM Thompson Grayer, MD [facility]  06/01/2019 11:15 AM Suzzanne Cloud, NP [facility] None   1 year follow up unless needs [place] sooner Lab/Order associations: fasting Preventative health care - Plan: CBC, Comprehensive metabolic panel, Lipid panel, POCT Urinalysis Dipstick (Automated), VITAMIN D 25 Hydroxy (Vit-D Deficiency, Fractures)  High cholesterol - Plan: CBC, Comprehensive metabolic panel, Lipid panel, POCT Urinalysis Dipstick (Automated)  Low vitamin D level - Plan: VITAMIN D 25 Hydroxy (Vit-D Deficiency, Fractures)  Meds ordered this encounter  Medications  . albuterol (VENTOLIN HFA) 108 (90 Base) MCG/ACT inhaler    Sig: Inhale 2 puffs into the lungs every 6 (six) hours as needed for wheezing or shortness of breath.    Dispense:  1 Inhaler    Refill:  2   Return precautions advised.  Garret Reddish, MD

## 2018-07-07 NOTE — Addendum Note (Signed)
Addended by: Francis Dowse T on: 07/07/2018 10:21 AM   Modules accepted: Orders

## 2018-07-07 NOTE — Patient Instructions (Addendum)
Please stop by lab before you go If you do not have mychart- we will call you about results within 5 business days of [place] receiving them.  If you have mychart- we will send your results within 3 business days of [place] receiving them.  If abnormal or we want to clarify a result, we will call or mychart you to make sure you receive the message.  If you have questions or concerns or don't hear within 5-7 days, please send [place] a message or call us.   As always would love to see you quit smoking- I know this is a tough time so I understand the difficulty at present

## 2018-07-11 LAB — CUP PACEART REMOTE DEVICE CHECK
Date Time Interrogation Session: 20200606113552
Implantable Pulse Generator Implant Date: 20180830

## 2018-07-12 ENCOUNTER — Telehealth: Payer: Self-pay

## 2018-07-12 ENCOUNTER — Ambulatory Visit (INDEPENDENT_AMBULATORY_CARE_PROVIDER_SITE_OTHER): Payer: Medicare Other | Admitting: *Deleted

## 2018-07-12 ENCOUNTER — Other Ambulatory Visit: Payer: Self-pay | Admitting: Obstetrics and Gynecology

## 2018-07-12 DIAGNOSIS — M810 Age-related osteoporosis without current pathological fracture: Secondary | ICD-10-CM

## 2018-07-12 DIAGNOSIS — I480 Paroxysmal atrial fibrillation: Secondary | ICD-10-CM

## 2018-07-12 DIAGNOSIS — I495 Sick sinus syndrome: Secondary | ICD-10-CM

## 2018-07-12 DIAGNOSIS — Z1231 Encounter for screening mammogram for malignant neoplasm of breast: Secondary | ICD-10-CM

## 2018-07-21 NOTE — Progress Notes (Signed)
Carelink Summary Report / Loop Recorder 

## 2018-07-30 ENCOUNTER — Ambulatory Visit: Payer: Medicare Other | Admitting: Neurology

## 2018-08-02 ENCOUNTER — Encounter: Payer: Self-pay | Admitting: Neurology

## 2018-08-02 ENCOUNTER — Other Ambulatory Visit: Payer: Self-pay

## 2018-08-02 ENCOUNTER — Ambulatory Visit (INDEPENDENT_AMBULATORY_CARE_PROVIDER_SITE_OTHER): Payer: Medicare Other | Admitting: Neurology

## 2018-08-02 VITALS — BP 113/68 | HR 64 | Temp 98.0°F

## 2018-08-02 DIAGNOSIS — G43719 Chronic migraine without aura, intractable, without status migrainosus: Secondary | ICD-10-CM | POA: Diagnosis not present

## 2018-08-02 MED ORDER — ONABOTULINUMTOXINA 100 UNITS IJ SOLR
200.0000 [IU] | Freq: Once | INTRAMUSCULAR | Status: AC
  Administered 2018-08-02: 200 [IU] via INTRAMUSCULAR

## 2018-08-02 NOTE — Progress Notes (Signed)
Please refer to Botox procedure note. 

## 2018-08-02 NOTE — Procedures (Signed)
     BOTOX PROCEDURE NOTE FOR MIGRAINE HEADACHE   HISTORY: Suzanne [name] is a 68 year old patient with a history of intractable migraine.  She usually gets good benefit from the Botox injection, but this cycle her headaches return for 5 weeks prior to the Botox injection.  The patient still gets good benefit from the Imitrex when she takes it, her headaches may last up to 5 days at a time.  She comes back in for another Botox therapy.   Description of procedure:  The patient was placed in a sitting position. The standard protocol was used for Botox as follows, with 5 units of Botox injected at each site:   -Procerus muscle, midline injection  -Corrugator muscle, bilateral injection  -Frontalis muscle, bilateral injection, with 2 sites each side, medial injection was performed in the upper one third of the frontalis muscle, in the region vertical from the medial inferior edge of the superior orbital rim. The lateral injection was again in the upper one third of the forehead vertically above the lateral limbus of the cornea, 1.5 cm lateral to the medial injection site.  -Temporalis muscle injection, 4 sites, bilaterally. The first injection was 3 cm above the tragus of the ear, second injection site was 1.5 cm to 3 cm up from the first injection site in line with the tragus of the ear. The third injection site was 1.5-3 cm forward between the first 2 injection sites. The fourth injection site was 1.5 cm posterior to the second injection site.  -Occipitalis muscle injection, 3 sites, bilaterally. The first injection was done one half way between the occipital protuberance and the tip of the mastoid process behind the ear. The second injection site was done lateral and superior to the first, 1 fingerbreadth from the first injection. The third injection site was 1 fingerbreadth superiorly and medially from the first injection site.  -Cervical paraspinal muscle injection, 2 sites, bilateral, the  first injection site was 1 cm from the midline of the cervical spine, 3 cm inferior to the lower border of the occipital protuberance. The second injection site was 1.5 cm superiorly and laterally to the first injection site.  -Trapezius muscle injection was performed at 3 sites, bilaterally. The first injection site was in the upper trapezius muscle halfway between the inflection point of the neck, and the acromion. The second injection site was one half way between the acromion and the first injection site. The third injection was done between the first injection site and the inflection point of the neck.   A 200 unit bottle of Botox was used, 155 units were injected, the rest of the Botox was wasted. The patient tolerated the procedure well, there were no complications of the above procedure.  Botox NDC 9371-6967-89 Lot number [id number] Expiration date December 2022

## 2018-08-05 ENCOUNTER — Telehealth: Payer: Self-pay | Admitting: Neurology

## 2018-08-05 NOTE — Telephone Encounter (Signed)
I called to schedule the patient for her Botox apt but she did not answer so I left a VM asking her to call us back.

## 2018-08-05 NOTE — Telephone Encounter (Signed)
Patient called back and scheduled with receptionist. DW

## 2018-08-12 ENCOUNTER — Ambulatory Visit (INDEPENDENT_AMBULATORY_CARE_PROVIDER_SITE_OTHER): Payer: Medicare Other | Admitting: *Deleted

## 2018-08-12 DIAGNOSIS — I480 Paroxysmal atrial fibrillation: Secondary | ICD-10-CM

## 2018-08-12 LAB — CUP PACEART REMOTE DEVICE CHECK
Date Time Interrogation Session: 20200709121246
Implantable Pulse Generator Implant Date: 20180830

## 2018-08-12 NOTE — Telephone Encounter (Signed)
Entered in error

## 2018-08-16 ENCOUNTER — Other Ambulatory Visit: Payer: Self-pay | Admitting: Neurology

## 2018-08-16 MED FILL — BUTALBITAL-APAP-CAFFEINE 50: 50-325-40 | 22 days supply | Qty: 90 | Fill #2

## 2018-08-16 MED FILL — SERTRALINE HCL 100 MG TAB: 100 | 90 days supply | Qty: 90 | Fill #1

## 2018-08-16 MED FILL — ALPRAZolam 0.5 MG TABS: 0.5 | 90 days supply | Qty: 180 | Fill #1

## 2018-08-16 MED FILL — SUMATRIPTAN SUCC 100 MG TAB: 100 | 90 days supply | Qty: 27 | Fill #0

## 2018-08-16 MED FILL — buPROPion HCL ER (XL) 300 M: 300 | 90 days supply | Qty: 90 | Fill #1

## 2018-08-23 NOTE — Progress Notes (Signed)
Carelink Summary Report / Loop Recorder 

## 2018-09-14 ENCOUNTER — Ambulatory Visit (INDEPENDENT_AMBULATORY_CARE_PROVIDER_SITE_OTHER): Payer: Medicare Other | Admitting: *Deleted

## 2018-09-14 DIAGNOSIS — I480 Paroxysmal atrial fibrillation: Secondary | ICD-10-CM | POA: Diagnosis not present

## 2018-09-14 LAB — CUP PACEART REMOTE DEVICE CHECK
Date Time Interrogation Session: 20200811123550
Implantable Pulse Generator Implant Date: 20180830

## 2018-09-15 ENCOUNTER — Other Ambulatory Visit: Payer: Self-pay | Admitting: Neurology

## 2018-09-22 NOTE — Progress Notes (Signed)
Carelink Summary Report / Loop Recorder 

## 2018-09-24 ENCOUNTER — Encounter: Payer: Self-pay | Admitting: Family Medicine

## 2018-09-28 ENCOUNTER — Ambulatory Visit
Admission: RE | Admit: 2018-09-28 | Discharge: 2018-09-28 | Disposition: A | Discharge status: Home / Self Care | Payer: Medicare Other | Source: Ambulatory Visit | Attending: Obstetrics and Gynecology | Admitting: Obstetrics and Gynecology

## 2018-09-28 ENCOUNTER — Other Ambulatory Visit: Payer: Self-pay

## 2018-09-28 DIAGNOSIS — M810 Age-related osteoporosis without current pathological fracture: Secondary | ICD-10-CM

## 2018-09-28 DIAGNOSIS — Z1231 Encounter for screening mammogram for malignant neoplasm of breast: Secondary | ICD-10-CM

## 2018-09-30 ENCOUNTER — Other Ambulatory Visit: Payer: Self-pay | Admitting: Internal Medicine

## 2018-09-30 MED FILL — ELIQUIS 5 MG TABLET: 5 | 90 days supply | Qty: 180 | Fill #0

## 2018-09-30 NOTE — Telephone Encounter (Signed)
Pt last saw Dr Rayann Heman 12/21/17, last labs 07/07/18 Creat 0.77, age 68, weight 71.3kg, based on specified criteria pt is on appropriate dosage of Eliquis 5mg  BID.  Will refill rx.

## 2018-10-01 ENCOUNTER — Telehealth: Payer: Self-pay | Admitting: *Deleted

## 2018-10-01 NOTE — Telephone Encounter (Signed)
   [facility] Pre-operative Risk Assessment    Request for surgical clearance:  1. What type of surgery is being performed?  LEFT TOTAL KNEE REPLACEMENT   2. When is this surgery scheduled?  TBD   3. What type of clearance is required (medical clearance vs. Pharmacy clearance to hold med vs. Both)?  BOTH  4. Are there any medications that need to be held prior to surgery and how long? ELIQUIS   5. Practice name and name of physician performing surgery?  [facility] / DR. Noemi Chapel   6. What is your office phone number [phone]      7.   What is your office fax number [phone] ATTN:  [place]    8.   Anesthesia type (None, local, MAC, general) ?  N/A   Jeanann Lewandowsky 10/01/2018, 4:09 PM  _________________________________________________________________   (provider comments below)

## 2018-10-03 NOTE — Telephone Encounter (Signed)
Clinical pharmacist to review eliquis 

## 2018-10-04 NOTE — Telephone Encounter (Signed)
Will forward to Gracy Bruins to schedule appt with EP

## 2018-10-04 NOTE — Telephone Encounter (Signed)
Pt takes Eliquis for afib with CHADS2VASc score of 2 (age, sex). Renal function is normal. Ok to hold Eliquis for 3 days prior to procedure.

## 2018-10-04 NOTE — Telephone Encounter (Signed)
   Primary Cardiologist:James Allred, MD  Chart reviewed as part of pre-operative protocol coverage. Because of Suzanne [name]'s past medical history and time since last visit, he/she will require a follow-up visit in order to better assess preoperative cardiovascular risk.  Pre-op covering staff: - Please schedule appointment and call patient to inform them. - Please contact requesting surgeon's office via preferred method (i.e, phone, fax) to inform them of need for appointment prior to surgery.  If applicable, this message will also be routed to pharmacy pool and/or primary cardiologist for input on holding anticoagulant/antiplatelet agent as requested below so that this information is available at time of patient's appointment.   Lyda Jester, PA-C  10/04/2018, 9:17 AM

## 2018-10-06 NOTE — Telephone Encounter (Signed)
[place] can we get pt scheduled for appt with EP?

## 2018-10-12 MED FILL — NEO/POLY/DEXAMET EYE OINT: 3.5-10000-0 | 10 days supply | Qty: 4 | Fill #0

## 2018-10-12 NOTE — Telephone Encounter (Signed)
Pending schedule appt with EP.

## 2018-10-14 NOTE — Telephone Encounter (Signed)
Another fax received today for clearance for pt. I left message for surgery scheduler at [facility] pt needed appt and has been scheduled for 10/27/18 3:15 with Dr. Rayann Heman. Once pt has been cleared we will send clearance. If any questions please call the office [phone].

## 2018-10-18 ENCOUNTER — Ambulatory Visit (INDEPENDENT_AMBULATORY_CARE_PROVIDER_SITE_OTHER): Payer: Medicare Other | Admitting: *Deleted

## 2018-10-18 DIAGNOSIS — I495 Sick sinus syndrome: Secondary | ICD-10-CM

## 2018-10-18 LAB — CUP PACEART REMOTE DEVICE CHECK
Date Time Interrogation Session: 20200913123703
Implantable Pulse Generator Implant Date: 20180830

## 2018-10-27 ENCOUNTER — Telehealth (INDEPENDENT_AMBULATORY_CARE_PROVIDER_SITE_OTHER): Payer: Medicare Other | Admitting: Internal Medicine

## 2018-10-27 DIAGNOSIS — I480 Paroxysmal atrial fibrillation: Secondary | ICD-10-CM

## 2018-10-27 DIAGNOSIS — G4733 Obstructive sleep apnea (adult) (pediatric): Secondary | ICD-10-CM | POA: Diagnosis not present

## 2018-10-27 DIAGNOSIS — Z01810 Encounter for preprocedural cardiovascular examination: Secondary | ICD-10-CM

## 2018-10-27 NOTE — Progress Notes (Signed)
Electrophysiology TeleHealth Note   Due to national recommendations of social distancing due to COVID 19, an audio/video telehealth visit is felt to be most appropriate for this patient at this time.  See MyChart message from today for the patient's consent to telehealth for [facility].     Date:  10/27/2018   ID:  Suzanne [name], DOB [birth year], MRN [id number]  Location: patient's home  Provider location:  [facility] [place]  Evaluation Performed: Follow-up visit  PCP:  Marin Olp, MD   Electrophysiologist:  Dr Rayann Heman  Chief Complaint:  AF follow up  History of Present Illness:    Suzanne [name] is a 68 y.o. female who presents via telehealth conferencing today.  Since last being seen in our clinic, the patient reports doing very well.  Today, she denies symptoms of palpitations, chest pain, shortness of breath,  lower extremity edema, dizziness, presyncope, or syncope.  The patient is otherwise without complaint today.  The patient denies symptoms of fevers, chills, cough, or new SOB worrisome for COVID 19.  Past Medical History:  Diagnosis Date  . Anxiety   . Asthma   . Atrial fibrillation ([place])    s/p ablations  . Atrial flutter ([place])   . COPD (chronic obstructive pulmonary disease) ([place])   . Degenerative arthritis    "maybe in my left knee" (12/16/2016)  . Depression   . Frozen shoulder    hx bilaterally; "had to go thru PT; no problem w/them since I learned exercises" (12/16/2016)  . GERD (gastroesophageal reflux disease)   . Glaucoma   . H/O: rheumatic fever 1957  . High cholesterol   . Migraine    "getting botox now; now only 2-3/month" (12/16/2016)  . PONV (postoperative nausea and vomiting)    with Fentanyl,  Dilaudid &  Propofol (12/16/2016)    Past Surgical History:  Procedure Laterality Date  . ABDOMINAL HYSTERECTOMY  1990   right ovary removed  . APPENDECTOMY  1990  . ATRIAL FIBRILLATION ABLATION N/A 12/16/2016   Procedure: ATRIAL  FIBRILLATION ABLATION;  Surgeon: Thompson Grayer, MD;  Location: [place] [facility];  Service: Cardiovascular;  Laterality: N/A;  . CATARACT EXTRACTION, BILATERAL    . CTI ablation  3/11  . ESOPHAGOGASTRODUODENOSCOPY (EGD) WITH PROPOFOL N/A 01/06/2013   Procedure: ESOPHAGOGASTRODUODENOSCOPY (EGD) WITH PROPOFOL;  Surgeon: Cleotis Nipper, MD;  Location: [facility];  Service: Endoscopy;  Laterality: N/A;  . KNEE ARTHROSCOPY Bilateral 1999-2006  . KNEE SURGERY Bilateral [id number]   "open; for congenital deformity"  . LOOP RECORDER INSERTION N/A 10/02/2016   Procedure: LOOP RECORDER INSERTION;  Surgeon: Thompson Grayer, MD;  Location: [place] [facility];  Service: Cardiovascular;  Laterality: N/A;  . PERIPHERAL VASCULAR INTERVENTION  2/11  & 6/11  . RHINOPLASTY  1965  . TONSILLECTOMY  as child  . TOOTH EXTRACTION  2009   2 teeth extractted    Current Outpatient Medications  Medication Sig Dispense Refill  . acetaminophen (TYLENOL) 325 MG tablet Take 325 mg 2 (two) times daily as needed by mouth (pain).     Marland Kitchen albuterol (VENTOLIN HFA) 108 (90 Base) MCG/ACT inhaler Inhale 2 puffs into the lungs every 6 (six) hours as needed for wheezing or shortness of breath. 1 Inhaler 2  . ALPRAZolam (XANAX) 0.5 MG tablet Take 0.5 mg at bedtime as needed by mouth for anxiety or sleep. Anxiety or sleep    . BOTOX 200 units SOLR INJECT 200 UNITS  INTRAMUSCULARLY EVERY 3  MONTHS (  GIVEN AT MD OFFICE, DISCARD UNUSED PORTION  AFTER FIRST USE) 1 each 3  . buPROPion (WELLBUTRIN XL) 300 MG 24 hr tablet Take 300 mg daily by mouth.     . butalbital-acetaminophen-caffeine (FIORICET, ESGIC) 50-325-40 MG tablet TAKE 1 TABLET BY MOUTH EVERY 6 HOURS AS NEEDED FOR HEADACHE OR MIGRAINE 90 tablet 3  . diclofenac sodium (VOLTAREN) 1 % GEL Apply topically 4 (four) times daily.    . dorzolamide-timolol (COSOPT) 22.3-6.8 MG/ML ophthalmic solution Place 1 drop 2 (two) times daily into both eyes.   3  . ELIQUIS 5 MG TABS tablet TAKE 1  TABLET BY MOUTH TWICE DAILY 180 tablet 1  . Hypromellose (ARTIFICIAL TEARS OP) Apply 1 drop daily as needed to eye (dry eyes).    . promethazine (PHENERGAN) 25 MG tablet Take 1 tablet (25 mg total) by mouth every 6 (six) hours as needed for nausea or vomiting. 20 tablet 1  . sertraline (ZOLOFT) 100 MG tablet Take 100 mg by mouth daily with breakfast.     . Simethicone (GAS-X PO) Take 2 tablets daily as needed by mouth (gas).    . sodium chloride (OCEAN) 0.65 % SOLN nasal spray Place 1 spray as needed into both nostrils for congestion.    . SUMAtriptan (IMITREX) 100 MG tablet TAKE 1 TABLET BY MOUTH AT THE ONSET OF A HEADACHE DAILY AS NEEDED 27 tablet 3   No current facility-administered medications for this visit.     Allergies:   Dilaudid [hydromorphone hcl], Fentanyl, Ciprofloxacin, Erythromycin, and Percocet [oxycodone-acetaminophen]   Social History:  The patient  reports that she has been smoking cigarettes. She has a 37.50 pack-year smoking history. She has never used smokeless tobacco. She reports current alcohol use. She reports that she does not use drugs.   Family History:  The patient's family history includes Cancer in her mother; Dementia in her father; Heart disease in her father; Hyperlipidemia in her brother; Migraines in her mother; Other in her brother.   ROS:  Please see the history of present illness.   All other systems are personally reviewed and negative.    Exam:    Vital Signs:  There were no vitals taken for this visit.  Well sounding and appearing, alert and conversant, regular work of breathing,  good skin color Eyes- anicteric, neuro- grossly intact, skin- no apparent rash or lesions or cyanosis, mouth- oral mucosa is pink  Labs/Other Tests and Data Reviewed:    Recent Labs: 07/07/2018: ALT 10; BUN 14; Creatinine, Ser 0.77; Hemoglobin 13.8; Platelets 189.0; Potassium 4.4; Sodium 136   Wt Readings from Last 3 Encounters:  07/07/18 157 lb 4 oz (71.3 kg)   04/23/18 156 lb (70.8 kg)  02/08/18 158 lb (71.7 kg)     Last device remote is reviewed from [facility] PDF which reveals normal device function, no arrhythmias     ASSESSMENT & PLAN:    1.  Paroxysmal atrial fibrillation and atrial flutter Doing well with no significant recurrence post ablation Continue eliquis for CHADS2VASC of 2  2.  OSA Followed by Dr Maxwell Caul  3.  Surgical clearance She is pending left total knee replacement Calcium score was 0 by CT scan in 2018 She has no chest pain or shortness of breath with activity She is at acceptable cardiac risk for surgery Ok to hold Eliquis for surgery   4.  Hyperlipidemia Followed by PCP who has recommended statin, she is working on diet changes  Follow-up:  1 year  Patient Risk:  after full review of this patients clinical status, I feel that they are at moderate risk at this time.  Today, I have spent 15 minutes with the patient with telehealth technology discussing arrhythmia management .    SignedThompson Grayer, MD  10/27/2018 3:14 PM     [address] [place] [address] [phone] (office) [phone] (fax)

## 2018-10-29 NOTE — Progress Notes (Signed)
Carelink Summary Report / Loop Recorder 

## 2018-11-01 NOTE — Progress Notes (Signed)
Phone [phone]   Subjective:  Suzanne [name] is a 68 y.o. year old very pleasant female patient who presents for/with See problem oriented charting Chief Complaint  Patient presents with  . Follow-up  . smoking cessation  . discuss starting a statin   ROS-no fever/chills/nausea/vomiting.  Rare wheezing/shortness of breath-resolved with albuterol  Past Medical History-  Patient Active Problem List   Diagnosis Date Noted  . Paroxysmal atrial fibrillation [facility]) s/p ablation november 2018, 2011 previously 12/16/2016    Priority: High  . Tobacco abuse 10/03/2013    Priority: High  . COPD (chronic obstructive pulmonary disease) ([place]) 02/01/2009    Priority: High  . Glaucoma 01/08/2018    Priority: Medium  . Osteoarthritis of left knee 01/08/2018    Priority: Medium  . High cholesterol     Priority: Medium  . Intractable chronic migraine without aura 05/17/2014    Priority: Medium  . Depression, major, single episode, complete remission ([place]) 02/01/2009    Priority: Medium  . Asthma 02/01/2009    Priority: Medium  . Low vitamin D level 01/08/2018    Priority: Low  . Nasal injury 10/20/2016    Priority: Low  . S/P ablation of atrial fibrillation 05/16/2013    Priority: Low    Medications- reviewed and updated Current Outpatient Medications  Medication Sig Dispense Refill  . acetaminophen (TYLENOL) 325 MG tablet Take 325 mg 2 (two) times daily as needed by mouth (pain).     Marland Kitchen albuterol (VENTOLIN HFA) 108 (90 Base) MCG/ACT inhaler Inhale 2 puffs into the lungs every 6 (six) hours as needed for wheezing or shortness of breath. 1 Inhaler 2  . ALPRAZolam (XANAX) 0.5 MG tablet Take 0.5 mg at bedtime as needed by mouth for anxiety or sleep. Anxiety or sleep    . BOTOX 200 units SOLR INJECT 200 UNITS  INTRAMUSCULARLY EVERY 3  MONTHS (GIVEN AT MD OFFICE, DISCARD UNUSED PORTION  AFTER FIRST USE) 1 each 3  . buPROPion (WELLBUTRIN XL) 300 MG 24 hr tablet Take 300 mg daily by  mouth.     . butalbital-acetaminophen-caffeine (FIORICET, ESGIC) 50-325-40 MG tablet TAKE 1 TABLET BY MOUTH EVERY 6 HOURS AS NEEDED FOR HEADACHE OR MIGRAINE 90 tablet 3  . diclofenac sodium (VOLTAREN) 1 % GEL Apply topically 4 (four) times daily.    . dorzolamide-timolol (COSOPT) 22.3-6.8 MG/ML ophthalmic solution Place 1 drop 2 (two) times daily into both eyes.   3  . ELIQUIS 5 MG TABS tablet TAKE 1 TABLET BY MOUTH TWICE DAILY 180 tablet 1  . Hypromellose (ARTIFICIAL TEARS OP) Apply 1 drop daily as needed to eye (dry eyes).    . Probiotic Product (ALIGN PO) Take by mouth.    . promethazine (PHENERGAN) 25 MG tablet Take 1 tablet (25 mg total) by mouth every 6 (six) hours as needed for nausea or vomiting. 20 tablet 1  . sertraline (ZOLOFT) 100 MG tablet Take 100 mg by mouth daily with breakfast.     . Simethicone (GAS-X PO) Take 2 tablets daily as needed by mouth (gas).    . sodium chloride (OCEAN) 0.65 % SOLN nasal spray Place 1 spray as needed into both nostrils for congestion.    . SUMAtriptan (IMITREX) 100 MG tablet TAKE 1 TABLET BY MOUTH AT THE ONSET OF A HEADACHE DAILY AS NEEDED 27 tablet 3   No current facility-administered medications for this visit.      Objective:  BP 120/72   Pulse 60   Temp 98.7 F (  37.1 C)   Ht 5\' 7"  (1.702 m)   Wt 160 lb 3.2 oz (72.7 kg)   SpO2 96%   BMI 25.09 kg/m  Gen: NAD, resting comfortably CV: RRR no murmurs rubs or gallops Lungs: CTAB no crackles, wheeze, rhonchi Abdomen: soft/nontender/nondistended/normal bowel sounds Ext: no edema Skin: warm, dry Neuro: grossly normal, moves all extremities    Assessment and Plan   # Left Knee pain S:has had 4 surgeries- still with bone on bone arthritis. Can't take nsaids due to NOAC. Tylenol doesn't help. Tries topical nsaids.  She is working with Kassie Mends does not want to have surgery but feels like she needs to move forward. A/P: I had received a clearance form from [facility] and  expressed my concern given patient's smoking status and elevated lipids in June.  Fortunately surgery is going to be under local anesthesia which reduces cardiovascular and respiratory risk. -See discussion below about smoking cessation -See discussion below.hyperlipidemia -Patient has already been cleared by cardiology outside of these 2 issues  # Smoking Cessation/current smoker S:pt states she is down to 5 cigarettes a day and she has changed her habits in not smoking after meals. She knows its a slow process but she is dong well with it.   Idea of losing smoking long-term is very difficult for her especially in light of depression history- this has been a big long-term stress reliever A/P: Congratulated patient on reducing cigarettes to 5/day-she is agreeable to stopping smoking 1 month prior to her surgery and trying to remain cigarette free 2 to 4 weeks afterwards- reducing cardiovascular, respiratory, wound healing risk  #hyperlipidemia S: Poorly controlled on no statin.  Had a brother with myocardial infarction at age 48 despite exercise, statin, great diet.  Has another brother who is overweight, only some exercise and is 39 with no issues-we discussed how role of genetics likely contributes  CHolesterol up 30 pts in a year and LDL up over 25 pts- feels like diet/exercise has  Lab Results  Component Value Date   CHOL 283 (H) 07/07/2018   HDL 69.00 07/07/2018   LDLCALC 187 (H) 07/07/2018   TRIG 137.0 07/07/2018   CHOLHDL 4 07/07/2018   A/P: Patient is concerned about potential lab error.  Based off above labs 10-year ASCVD risk is 14.3%.  She did have a coronary calcium scoring of 0 approximately 2 years ago which is encouraging.  We still opted if on repeat values-risk above 10%- statin at least around sugery- rosuvastatin 10mg  weekly.  #COPD/asthma S: Patient is sparingly using albuterol-mainly needs if doing a lot of housework.  Not having use much at all lately A/P: COPD and  asthma appears stable- still above that she is willing to at least try to quit smoking around time of surgery-would love for this to be long-term but she does not think this is possible  #Vitamin D deficiency S: VITAMIN D slightly level on last check-patient wanted to try getting out in the sun instead of starting a supplement A/P: We will update vitamin D when she comes back for labs-discussed potentially starting 1000 units/day if still in the 20s-if lower may try 50,000 unit boost but I did not discuss this specifically with her today  #Atrial fibrillation-followed by cardiology- patient is compliant with Eliquis.  No significant recurrence after ablation.  Stable-continue current medication  Recommended follow up: PRN for acute issue-visit today was primarily for surgical clearance- would recommend follow-up visit at least every 6 to 12 months-typically she  comes in for a physical midyear Future Appointments  Date Time Provider [place]  11/12/2018  8:15 AM [facility] LAB [facility]  11/19/2018  7:25 AM [facility] DEVICE REMOTES [facility]  06/01/2019 11:15 AM Suzzanne Cloud, NP [facility] None  07/18/2019  9:40 AM Marin Olp, MD [facility]  10/24/2019  2:15 PM Allred, Jeneen Rinks, MD [facility]    Lab/Order associations:   ICD-10-CM   1. High cholesterol  E78.00 Lipid panel  2. Low vitamin D level  R79.89 VITAMIN D 25 Hydroxy (Vit-D Deficiency, Fractures)  3. Chronic obstructive pulmonary disease, unspecified COPD type ([place])  J44.9   4. Primary osteoarthritis of left knee  M17.12   5. Mild intermittent asthma without complication  A999333   6. Tobacco abuse  Z72.0   7. Depression, major, single episode, complete remission (HCC) Chronic F32.5   8. Paroxysmal atrial fibrillation (HCC) Chronic I48.0    Return precautions advised.  Garret Reddish, MD

## 2018-11-01 NOTE — Patient Instructions (Addendum)
I love your idea of quitting smoking 4 weeks before surgery and trying to remain off 2-4 weeks after- this should improve respiratory risks around surgery and reduce cardiac risks- though thankful this is not general anesthesia  Schedule a lab visit at the check out desk within a week. Return for future fasting labs meaning nothing but water after midnight please. Ok to take your medications with water.   Consider once a week statin if 10 year risk remains above 10%- on the other side of the equation- your low coronary calcium score does make me feel more comfortable if you prefer to remain off

## 2018-11-03 ENCOUNTER — Encounter: Payer: Self-pay | Admitting: Neurology

## 2018-11-03 ENCOUNTER — Ambulatory Visit (INDEPENDENT_AMBULATORY_CARE_PROVIDER_SITE_OTHER): Payer: Medicare Other | Admitting: Neurology

## 2018-11-03 ENCOUNTER — Other Ambulatory Visit: Payer: Self-pay

## 2018-11-03 VITALS — BP 132/73 | HR 57 | Temp 96.9°F | Wt 159.0 lb

## 2018-11-03 DIAGNOSIS — G43719 Chronic migraine without aura, intractable, without status migrainosus: Secondary | ICD-10-CM

## 2018-11-03 NOTE — Progress Notes (Signed)
The patient comes in for Botox injection today.  She brings up the question whether she could potentially try Aimovig or Ajovy for her headaches rather than Botox, if she desires to do this, she will let me know in the next 6 weeks and I will write a prescription and stop the Botox therapy.

## 2018-11-03 NOTE — Procedures (Signed)
     BOTOX PROCEDURE NOTE FOR MIGRAINE HEADACHE   HISTORY: Suzanne [name] is a 68 year old patient with a history of migraine headaches.  She continues to gain benefit with the Botox, she will still have occasional headaches requiring the use of Imitrex.  She returns for another Botox therapy.   Description of procedure:  The patient was placed in a sitting position. The standard protocol was used for Botox as follows, with 5 units of Botox injected at each site:   -Procerus muscle, midline injection  -Corrugator muscle, bilateral injection  -Frontalis muscle, bilateral injection, with 2 sites each side, medial injection was performed in the upper one third of the frontalis muscle, in the region vertical from the medial inferior edge of the superior orbital rim. The lateral injection was again in the upper one third of the forehead vertically above the lateral limbus of the cornea, 1.5 cm lateral to the medial injection site.  -Temporalis muscle injection, 4 sites, bilaterally. The first injection was 3 cm above the tragus of the ear, second injection site was 1.5 cm to 3 cm up from the first injection site in line with the tragus of the ear. The third injection site was 1.5-3 cm forward between the first 2 injection sites. The fourth injection site was 1.5 cm posterior to the second injection site.  -Occipitalis muscle injection, 3 sites, bilaterally. The first injection was done one half way between the occipital protuberance and the tip of the mastoid process behind the ear. The second injection site was done lateral and superior to the first, 1 fingerbreadth from the first injection. The third injection site was 1 fingerbreadth superiorly and medially from the first injection site.  -Cervical paraspinal muscle injection, 2 sites, bilateral, the first injection site was 1 cm from the midline of the cervical spine, 3 cm inferior to the lower border of the occipital protuberance. The second  injection site was 1.5 cm superiorly and laterally to the first injection site.  -Trapezius muscle injection was performed at 3 sites, bilaterally. The first injection site was in the upper trapezius muscle halfway between the inflection point of the neck, and the acromion. The second injection site was one half way between the acromion and the first injection site. The third injection was done between the first injection site and the inflection point of the neck.   A 200 unit bottle of Botox was used, 155 units were injected, the rest of the Botox was wasted. The patient tolerated the procedure well, there were no complications of the above procedure.  Botox NDC [id number] Lot number [id number] Expiration date May 2023

## 2018-11-05 ENCOUNTER — Encounter: Payer: Self-pay | Admitting: Family Medicine

## 2018-11-05 ENCOUNTER — Ambulatory Visit: Payer: Medicare Other | Admitting: Family Medicine

## 2018-11-05 ENCOUNTER — Other Ambulatory Visit: Payer: Self-pay

## 2018-11-05 VITALS — BP 120/72 | HR 60 | Temp 98.7°F | Ht 67.0 in | Wt 160.2 lb

## 2018-11-05 DIAGNOSIS — J449 Chronic obstructive pulmonary disease, unspecified: Secondary | ICD-10-CM | POA: Diagnosis not present

## 2018-11-05 DIAGNOSIS — I480 Paroxysmal atrial fibrillation: Secondary | ICD-10-CM

## 2018-11-05 DIAGNOSIS — M1712 Unilateral primary osteoarthritis, left knee: Secondary | ICD-10-CM | POA: Diagnosis not present

## 2018-11-05 DIAGNOSIS — Z720 Tobacco use: Secondary | ICD-10-CM

## 2018-11-05 DIAGNOSIS — R7989 Other specified abnormal findings of blood chemistry: Secondary | ICD-10-CM

## 2018-11-05 DIAGNOSIS — F325 Major depressive disorder, single episode, in full remission: Secondary | ICD-10-CM

## 2018-11-05 DIAGNOSIS — J4520 Mild intermittent asthma, uncomplicated: Secondary | ICD-10-CM

## 2018-11-05 DIAGNOSIS — E7800 Pure hypercholesterolemia, unspecified: Secondary | ICD-10-CM | POA: Diagnosis not present

## 2018-11-12 ENCOUNTER — Encounter: Payer: Self-pay | Admitting: Family Medicine

## 2018-11-12 ENCOUNTER — Other Ambulatory Visit (INDEPENDENT_AMBULATORY_CARE_PROVIDER_SITE_OTHER): Payer: Medicare Other

## 2018-11-12 ENCOUNTER — Other Ambulatory Visit: Payer: Self-pay

## 2018-11-12 DIAGNOSIS — R7989 Other specified abnormal findings of blood chemistry: Secondary | ICD-10-CM | POA: Diagnosis not present

## 2018-11-12 DIAGNOSIS — E7800 Pure hypercholesterolemia, unspecified: Secondary | ICD-10-CM

## 2018-11-12 LAB — LIPID PANEL
Cholesterol: 258 mg/dL — ABNORMAL HIGH (ref 0–200)
HDL: 65 mg/dL (ref >39.00)
LDL Cholesterol: 168 mg/dL — ABNORMAL HIGH (ref 0–99)
NonHDL: 192.95
Total CHOL/HDL Ratio: 4
Triglycerides: 127 mg/dL (ref 0.0–149.0)
VLDL: 25.4 mg/dL (ref 0.0–40.0)

## 2018-11-12 LAB — VITAMIN D 25 HYDROXY (VIT D DEFICIENCY, FRACTURES): VITD: 21.6 ng/mL — ABNORMAL LOW (ref 30.00–100.00)

## 2018-11-12 MED ORDER — ROSUVASTATIN CALCIUM 10 MG PO TABS: 10.0000 mg | ORAL_TABLET | ORAL | 3 refills | Status: DC

## 2018-11-12 MED ORDER — VITAMIN D (ERGOCALCIFEROL) 1.25 MG (50000 UNIT) PO CAPS: 50000.0000 [IU] | ORAL_CAPSULE | ORAL | 0 refills | Status: DC

## 2018-11-12 MED FILL — ROSUVASTATIN CALCIUM 10 MG: 10 | 84 days supply | Qty: 12 | Fill #0

## 2018-11-12 MED FILL — VIT D2 1.25 MG (50,000 UNIT: 1.25 MG | 84 days supply | Qty: 12 | Fill #0

## 2018-11-12 NOTE — Progress Notes (Signed)
Cholesterol has improved significantly but ten-year risk of heart attack or stroke remains at 14%-we discussed if ten-year risk of heart attack or stroke was above 10% to consider rosuvastatin 10 mg weekly #13 with 3 refills- I sent this in for you and would recommend this at least around the time of surgery  Vitamin D is low at 21 ng/mL- would recommend 12 weeks of high-dose supplement with 50,000 units/week-I sent this in for you.  Would take 1000 units/day after this  Team also please write a letter for patient to [facility] and fax to them which states  To whom it may concern,  Patient is optimized for surgery from a medical standpoint.  She is going to use a statin perioperatively.  She is going to quit smoking a month before her surgery and try to remain cigarette free a month afterwards.  Thanks, Garret Reddish

## 2018-11-15 ENCOUNTER — Telehealth: Payer: Self-pay

## 2018-11-15 NOTE — Telephone Encounter (Signed)
I spoke with the patient and scheduled a botox apt, This will put her almost 3 weeks past her due date. Are there any work ins available or should I just add her to the wait list?

## 2018-11-15 NOTE — Telephone Encounter (Signed)
I called to schedule the patient but they did not answer so I left a VM asking them to call me back.  ° °

## 2018-11-15 NOTE — Telephone Encounter (Signed)
Suzanne [name] has some openings November 10, 17, 18, 19 and the following week.

## 2018-11-19 ENCOUNTER — Ambulatory Visit (INDEPENDENT_AMBULATORY_CARE_PROVIDER_SITE_OTHER): Payer: Medicare Other | Admitting: *Deleted

## 2018-11-19 DIAGNOSIS — I480 Paroxysmal atrial fibrillation: Secondary | ICD-10-CM

## 2018-11-19 MED FILL — BUTALB-ACETAMIN-CAFF 50-325: 50-325-40 | 22 days supply | Qty: 90 | Fill #3

## 2018-11-21 LAB — CUP PACEART REMOTE DEVICE CHECK
Date Time Interrogation Session: 20201016132553
Implantable Pulse Generator Implant Date: 20180830

## 2018-11-22 NOTE — Telephone Encounter (Signed)
She is not due until December, so it would have to be in December.

## 2018-11-22 NOTE — Telephone Encounter (Signed)
Wednesday December 2nd she has some openings. Any of those could be used for Botox

## 2018-11-24 NOTE — Progress Notes (Signed)
Carelink Summary Report / Loop Recorder 

## 2018-11-29 MED FILL — SERTRALINE HCL 100 MG TAB: 100 | 90 days supply | Qty: 90 | Fill #0

## 2018-11-29 MED FILL — ALPRAZolam 0.5 MG TABS: 0.5 | 90 days supply | Qty: 180 | Fill #0

## 2018-11-29 MED FILL — buPROPion HCL ER (XL) 300 M: 300 | 90 days supply | Qty: 90 | Fill #0

## 2018-12-14 ENCOUNTER — Encounter: Payer: Self-pay | Admitting: Physician Assistant

## 2018-12-14 DIAGNOSIS — F411 Generalized anxiety disorder: Secondary | ICD-10-CM

## 2018-12-14 HISTORY — DX: Generalized anxiety disorder: F41.1

## 2018-12-14 NOTE — H&P (Signed)
TOTAL KNEE ADMISSION H&P  Patient is being admitted for left total knee arthroplasty.  Subjective:  Chief Complaint:left knee pain.  HPI: Suzanne [name], 68 y.o. female, has a history of pain and functional disability in the left knee due to arthritis and has failed non-surgical conservative treatments for greater than 12 weeks to includeNSAID's and/or analgesics, corticosteriod injections, viscosupplementation injections, flexibility and strengthening excercises, supervised PT with diminished ADL's post treatment, weight reduction as appropriate and activity modification.  Onset of symptoms was gradual, starting 10 years ago with gradually worsening course since that time. The patient noted prior procedures on the knee to include  arthroscopy and menisectomy on the left knee(s).  Patient currently rates pain in the left knee(s) at 10 out of 10 with activity. Patient has night pain, worsening of pain with activity and weight bearing, pain that interferes with activities of daily living, crepitus and joint swelling.  Patient has evidence of subchondral sclerosis, periarticular osteophytes and joint space narrowing by imaging studies.  There is no active infection.  Patient Active Problem List   Diagnosis Date Noted  . Anxiety state 12/14/2018  . Glaucoma 01/08/2018  . Primary localized osteoarthritis of left knee 01/08/2018  . Low vitamin D level 01/08/2018  . High cholesterol   . Paroxysmal atrial fibrillation [facility]) s/p ablation november 2018, 2011 previously 12/16/2016  . Nasal injury 10/20/2016  . Intractable chronic migraine without aura 05/17/2014  . Tobacco abuse 10/03/2013  . S/P ablation of atrial fibrillation 05/16/2013  . Depression, major, single episode, complete remission ([place]) 02/01/2009  . Asthma 02/01/2009  . COPD (chronic obstructive pulmonary disease) ([place]) 02/01/2009   Past Medical History:  Diagnosis Date  . Anxiety   . Anxiety state 12/14/2018  . Asthma   .  Atrial fibrillation ([place])    s/p ablations  . Atrial flutter ([place])   . COPD (chronic obstructive pulmonary disease) ([place])   . Degenerative arthritis    "maybe in my left knee" (12/16/2016)  . Depression   . Frozen shoulder    hx bilaterally; "had to go thru PT; no problem w/them since I learned exercises" (12/16/2016)  . GERD (gastroesophageal reflux disease)   . Glaucoma   . H/O: rheumatic fever 1957  . High cholesterol   . Migraine    "getting botox now; now only 2-3/month" (12/16/2016)  . PONV (postoperative nausea and vomiting)    with Fentanyl,  Dilaudid &  Propofol (12/16/2016)    Past Surgical History:  Procedure Laterality Date  . ABDOMINAL HYSTERECTOMY  1990   right ovary removed  . APPENDECTOMY  1990  . ATRIAL FIBRILLATION ABLATION N/A 12/16/2016   Procedure: ATRIAL FIBRILLATION ABLATION;  Surgeon: Thompson Grayer, MD;  Location: [facility];  Service: Cardiovascular;  Laterality: N/A;  . CATARACT EXTRACTION, BILATERAL    . CTI ablation  3/11  . ESOPHAGOGASTRODUODENOSCOPY (EGD) WITH PROPOFOL N/A 01/06/2013   Procedure: ESOPHAGOGASTRODUODENOSCOPY (EGD) WITH PROPOFOL;  Surgeon: Cleotis Nipper, MD;  Location: [facility];  Service: Endoscopy;  Laterality: N/A;  . KNEE ARTHROSCOPY Bilateral 1999-2006  . KNEE SURGERY Bilateral [id number]   "open; for congenital deformity"  . LOOP RECORDER INSERTION N/A 10/02/2016   Procedure: LOOP RECORDER INSERTION;  Surgeon: Thompson Grayer, MD;  Location: [facility];  Service: Cardiovascular;  Laterality: N/A;  . PERIPHERAL VASCULAR INTERVENTION  2/11  & 6/11  . RHINOPLASTY  1965  . TONSILLECTOMY  as child  . TOOTH EXTRACTION  2009   2 teeth extractted  No current facility-administered medications for this encounter.    Current Outpatient Medications  Medication Sig Dispense Refill Last Dose  . acetaminophen (TYLENOL) 325 MG tablet Take 325 mg 2 (two) times daily as needed by mouth (pain).    12/13/2018 at Unknown time   . albuterol (VENTOLIN HFA) 108 (90 Base) MCG/ACT inhaler Inhale 2 puffs into the lungs every 6 (six) hours as needed for wheezing or shortness of breath. 1 Inhaler 2 Past Week at Unknown time  . ALPRAZolam (XANAX) 0.5 MG tablet Take 0.5 mg at bedtime as needed by mouth for anxiety or sleep. Anxiety or sleep   12/13/2018 at Unknown time  . buPROPion (WELLBUTRIN XL) 300 MG 24 hr tablet Take 300 mg daily by mouth.    12/14/2018 at Unknown time  . butalbital-acetaminophen-caffeine (FIORICET, ESGIC) 50-325-40 MG tablet TAKE 1 TABLET BY MOUTH EVERY 6 HOURS AS NEEDED FOR HEADACHE OR MIGRAINE (Patient taking differently: Take 1 tablet by mouth every 6 (six) hours as needed for headache. ) 90 tablet 3 12/13/2018 at Unknown time  . diclofenac sodium (VOLTAREN) 1 % GEL Apply 1 application topically 3 (three) times daily as needed (pain).    Past Month at Unknown time  . dorzolamide-timolol (COSOPT) 22.3-6.8 MG/ML ophthalmic solution Place 1 drop 2 (two) times daily into both eyes.   3 12/14/2018 at Unknown time  . ELIQUIS 5 MG TABS tablet TAKE 1 TABLET BY MOUTH TWICE DAILY 180 tablet 1 12/14/2018 at Unknown time  . Hypromellose (ARTIFICIAL TEARS OP) Place 1 drop into both eyes daily as needed (dry eyes).    12/13/2018 at Unknown time  . Probiotic Product (ALIGN PO) Take 4 mg by mouth daily.    12/14/2018 at Unknown time  . promethazine (PHENERGAN) 25 MG tablet Take 1 tablet (25 mg total) by mouth every 6 (six) hours as needed for nausea or vomiting. 20 tablet 1 Past Week at Unknown time  . rosuvastatin (CRESTOR) 10 MG tablet Take 1 tablet (10 mg total) by mouth once a week. 13 tablet 3 Past Week at Unknown time  . sertraline (ZOLOFT) 100 MG tablet Take 100 mg by mouth daily with breakfast.    12/14/2018 at Unknown time  . sodium chloride (OCEAN) 0.65 % SOLN nasal spray Place 1 spray as needed into both nostrils for congestion.   12/14/2018 at Unknown time  . SUMAtriptan (IMITREX) 100 MG tablet TAKE 1 TABLET BY  MOUTH AT THE ONSET OF A HEADACHE DAILY AS NEEDED (Patient taking differently: Take 100 mg by mouth every 2 (two) hours as needed for headache. TAKE 1 TABLET BY MOUTH AT THE ONSET OF A HEADACHE DAILY AS NEEDED) 27 tablet 3 Past Week at Unknown time  . Vitamin D, Ergocalciferol, (DRISDOL) 1.25 MG (50000 UT) CAPS capsule Take 1 capsule (50,000 Units total) by mouth every 7 (seven) days. 12 capsule 0 Past Week at Unknown time  . BOTOX 200 units SOLR INJECT 200 UNITS  INTRAMUSCULARLY EVERY 3  MONTHS (GIVEN AT MD OFFICE, DISCARD UNUSED PORTION  AFTER FIRST USE) 1 each 3 More than a month at Unknown time  . Simethicone (GAS-X PO) Take 2 tablets daily as needed by mouth (gas).   More than a month at Unknown time   Allergies  Allergen Reactions  . Dilaudid [Hydromorphone Hcl] Nausea And Vomiting  . Fentanyl Nausea And Vomiting  . Ciprofloxacin Rash  . Erythromycin Rash  . Percocet [Oxycodone-Acetaminophen] Nausea And Vomiting    Social History   Tobacco Use  .  Smoking status: Current Every Day Smoker    Packs/day: 0.75    Years: 50.00    Pack years: 37.50    Types: Cigarettes  . Smokeless tobacco: Never Used  . Tobacco comment: Discussed reduce to quit, gave card with resources to help in the community  Substance Use Topics  . Alcohol use: Yes    Comment: 12/16/2016 "1-2 glasses of wine per year"    Family History  Problem Relation Age of Onset  . Cancer Mother        renal cell  . Migraines Mother   . Dementia Father   . Heart disease Father        mid 5s  . Other Brother        died 42- out for run- no autopsy  . Hyperlipidemia Brother   . Breast cancer Neg Hx      Review of Systems  Constitutional: Negative.   HENT: Negative.   Eyes: Negative.   Respiratory: Negative.   Cardiovascular: Negative.   Gastrointestinal: Negative.   Genitourinary: Negative.   Musculoskeletal: Positive for back pain and joint pain.  Skin: Negative.   Neurological: Negative.    Endo/Heme/Allergies: Negative.   Psychiatric/Behavioral: Negative.     Objective:  Physical Exam  Constitutional: She is oriented to person, place, and time. She appears well-developed and well-nourished.  HENT:  Head: Normocephalic and atraumatic.  Mouth/Throat: Oropharynx is clear and moist.  Eyes: Conjunctivae and EOM are normal.  Neck: Neck supple.  Cardiovascular: Normal rate and regular rhythm.  Respiratory: Breath sounds normal.  GI: Bowel sounds are normal.  Genitourinary:    Genitourinary Comments: Not pertinent to current symptomatology therefore not examined.   Musculoskeletal:     Comments: Left knee valgus deformity.  0-120   3+ crepitus 2+ synovitis.   Lateral jointline tenderness.  Normal patella tracking   .   Right knee full range of motion with 1+crepitus, 1+ synovitis, and medial joint line tenderness.  DNVI  Neurological: She is alert and oriented to person, place, and time.  Skin: Skin is warm and dry.  Psychiatric: She has a normal mood and affect. Her behavior is normal.    Vital signs in last 24 hours: Temp:  [97.6 F (36.4 C)] 97.6 F (36.4 C) (11/10 1400) Pulse Rate:  [74] 74 (11/10 1400) BP: (123)/(71) 123/71 (11/10 1400) SpO2:  [98 %] 98 % (11/10 1400) Weight:  [70.1 kg] 70.1 kg (11/10 1400)  Labs:   Estimated body mass index is 24.21 kg/m as calculated from the following:   Height as of this encounter: 5\' 7"  (1.702 m).   Weight as of this encounter: 70.1 kg.   Imaging Review Plain radiographs demonstrate severe degenerative joint disease of the left knee(s). The overall alignment issignificant valgus. The bone quality appears to be good for age and reported activity level.      Assessment/Plan:  End stage arthritis, left knee Principal Problem:   Primary localized osteoarthritis of left knee Active Problems:   Depression, major, single episode, complete remission (HCC)   COPD (chronic obstructive pulmonary disease) (HCC)    Intractable chronic migraine without aura   Paroxysmal atrial fibrillation [facility]) s/p ablation november 2018, 2011 previously   Low vitamin D level   Anxiety state    The patient history, physical examination, clinical judgment of the provider and imaging studies are consistent with end stage degenerative joint disease of the left knee(s) and total knee arthroplasty is deemed medically necessary. The treatment options  including medical management, injection therapy arthroscopy and arthroplasty were discussed at length. The risks and benefits of total knee arthroplasty were presented and reviewed. The risks due to aseptic loosening, infection, stiffness, patella tracking problems, thromboembolic complications and other imponderables were discussed. The patient acknowledged the explanation, agreed to proceed with the plan and consent was signed. Patient is being admitted for inpatient treatment for surgery, pain control, PT, OT, prophylactic antibiotics, VTE prophylaxis, progressive ambulation and ADL's and discharge planning. The patient is planning to be discharged home with home health services     Patient's anticipated LOS is less than 2 midnights, meeting these requirements: - Younger than 65 - Lives within 1 hour of care - Has a competent adult at home to recover with post-op recover - NO history of  - Chronic pain requiring opiods  - Diabetes  - Coronary Artery Disease  - Heart failure  - Heart attack  - Stroke  - DVT/VTE  - Cardiac arrhythmia  - Respiratory Failure/COPD  - Renal failure  - Anemia  - Advanced Liver disease     

## 2018-12-16 NOTE — Patient Instructions (Addendum)
DUE TO COVID-19 ONLY ONE VISITOR IS ALLOWED TO COME WITH YOU AND STAY IN THE WAITING ROOM ONLY DURING PRE OP AND PROCEDURE DAY OF SURGERY. THE 1 VISITOR MAY VISIT WITH YOU AFTER SURGERY IN YOUR PRIVATE ROOM DURING VISITING HOURS ONLY!  YOU NEED TO HAVE A COVID 19 TEST ON    11/19_______ @__1 :00_____, THIS TEST MUST BE DONE BEFORE SURGERY, COME  [place] , [address].  ([place]) ONCE YOUR COVID TEST IS COMPLETED, PLEASE BEGIN THE QUARANTINE INSTRUCTIONS AS OUTLINED IN YOUR HANDOUT.                Suzanne [name]    Your procedure is scheduled on: 12/27/18   Report to [facility] Main  Entrance   Report to Short Stay 5:30 AM     Call this number if you have problems the morning of surgery [place], NO CHEWING GUM CANDY OR MINTS.    Do not eat food After Midnight.   YOU MAY HAVE CLEAR LIQUIDS FROM MIDNIGHT UNTIL 4:30AM  . At 4:30AM Please finish the prescribed Pre-Surgery  drink.   Nothing by mouth after you finish the  drink !   Take these medicines the morning of surgery with A SIP OF WATER: Wellbutrin, Zoloft, use your ventolin inhaler and bring it with you to the hospital                                 You may not have any metal on your body including hair pins and              piercings  Do not wear jewelry, make-up, lotions, powders or perfumes, deodorant             Do not wear nail polish on your fingernails.  Do not shave  48 hours prior to surgery.            Do not bring valuables to the hospital. [place].  Contacts, dentures or bridgework may not be worn into surgery.    Name and phone number of your driver:  Special Instructions: N/A              Please read over the following fact sheets you were given: _____________________________________________________________________             [facility] - Preparing for Surgery  Before surgery, you can play an important role .  Because skin is not sterile, your skin needs to be as free of germs as possible.   You can reduce the number of germs on your skin by washing with CHG (chlorahexidine gluconate) soap before surgery.   CHG is an antiseptic cleaner which kills germs and bonds with the skin to continue killing germs even after washing. Please DO NOT use if you have an allergy to CHG or antibacterial soaps .  If your skin becomes reddened/irritated stop using the CHG and inform your nurse when you arrive at Short Stay. Do not shave (including legs and underarms) for at least 48 hours prior to the first CHG shower.   Please follow these instructions carefully:  1.  Shower with CHG Soap the night before surgery and the  morning of  Surgery.  2.  If you choose to wash your hair, wash your hair first as usual with your  normal  shampoo.  3.  After you shampoo, rinse your hair and body thoroughly to remove the  shampoo.                                        4.  Use CHG as you would any other liquid soap.  You can apply chg directly  to the skin and wash                       Gently with a scrungie or clean washcloth.  5.  Apply the CHG Soap to your body ONLY FROM THE NECK DOWN.   Do not use on face/ open                           Wound or open sores. Avoid contact with eyes, ears mouth and genitals (private parts).                       Wash face,  Genitals (private parts) with your normal soap.             6.  Wash thoroughly, paying special attention to the area where your surgery  will be performed.  7.  Thoroughly rinse your body with warm water from the neck down.  8.  DO NOT shower/wash with your normal soap after using and rinsing off  the CHG Soap.             9.  Pat yourself dry with a clean towel.            10.  Wear clean pajamas.            11.  Place clean sheets on your bed the night of your first shower and do not  sleep  with pets. Day of Surgery : Do not apply any lotions/deodorants the morning of surgery.  Please wear clean clothes to the hospital/surgery center.  FAILURE TO FOLLOW THESE INSTRUCTIONS MAY RESULT IN THE CANCELLATION OF YOUR SURGERY PATIENT SIGNATURE_________________________________  NURSE SIGNATURE__________________________________  ________________________________________________________________________   Suzanne [name]  An incentive spirometer is a tool that can help keep your lungs clear and active. This tool measures how well you are filling your lungs with each breath. Taking long deep breaths may help reverse or decrease the chance of developing breathing (pulmonary) problems (especially infection) following:  A long period of time when you are unable to move or be active. BEFORE THE PROCEDURE   If the spirometer includes an indicator to show your best effort, your nurse or respiratory therapist will set it to a desired goal.  If possible, sit up straight or lean slightly forward. Try not to slouch.  Hold the incentive spirometer in an upright position. INSTRUCTIONS FOR USE  1. Sit on the edge of your bed if possible, or sit up as far as you can in bed or on a chair. 2. Hold the incentive spirometer in an upright position. 3. Breathe out normally. 4. Place the mouthpiece in your mouth and seal your lips tightly around it. 5. Breathe in slowly and as deeply as possible, raising the piston or the ball toward the top of the column. 6. Hold your breath for  3-5 seconds or for as long as possible. Allow the piston or ball to fall to the bottom of the column. 7. Remove the mouthpiece from your mouth and breathe out normally. 8. Rest for a few seconds and repeat Steps 1 through 7 at least 10 times every 1-2 hours when you are awake. Take your time and take a few normal breaths between deep breaths. 9. The spirometer may include an indicator to show your best effort. Use the  indicator as a goal to work toward during each repetition. 10. After each set of 10 deep breaths, practice coughing to be sure your lungs are clear. If you have an incision (the cut made at the time of surgery), support your incision when coughing by placing a pillow or rolled up towels firmly against it. Once you are able to get out of bed, walk around indoors and cough well. You may stop using the incentive spirometer when instructed by your caregiver.  RISKS AND COMPLICATIONS  Take your time so you do not get dizzy or light-headed.  If you are in pain, you may need to take or ask for pain medication before doing incentive spirometry. It is harder to take a deep breath if you are having pain. AFTER USE  Rest and breathe slowly and easily.  It can be helpful to keep track of a log of your progress. Your caregiver can provide you with a simple table to help with this. If you are using the spirometer at home, follow these instructions: [place] IF:   You are having difficultly using the spirometer.  You have trouble using the spirometer as often as instructed.  Your pain medication is not giving enough relief while using the spirometer.  You develop fever of 100.5 F (38.1 C) or higher. SEEK IMMEDIATE MEDICAL CARE IF:   You cough up bloody sputum that had not been present before.  You develop fever of 102 F (38.9 C) or greater.  You develop worsening pain at or near the incision site. MAKE SURE YOU:   Understand these instructions.  Will watch your condition.  Will get help right away if you are not doing well or get worse. Document Released: 06/02/2006 Document Revised: 04/14/2011 Document Reviewed: 08/03/2006 [facility] Patient Information 2014 [place], [place].   ________________________________________________________________________

## 2018-12-17 ENCOUNTER — Other Ambulatory Visit: Payer: Self-pay

## 2018-12-17 ENCOUNTER — Encounter (HOSPITAL_COMMUNITY): Payer: Self-pay

## 2018-12-17 ENCOUNTER — Encounter (HOSPITAL_COMMUNITY)
Admission: RE | Admit: 2018-12-17 | Discharge: 2018-12-17 | Disposition: A | Discharge status: Home / Self Care | Payer: Medicare Other | Source: Ambulatory Visit | Attending: Orthopedic Surgery | Admitting: Orthopedic Surgery

## 2018-12-17 DIAGNOSIS — Z01812 Encounter for preprocedural laboratory examination: Secondary | ICD-10-CM | POA: Insufficient documentation

## 2018-12-17 DIAGNOSIS — F17200 Nicotine dependence, unspecified, uncomplicated: Secondary | ICD-10-CM | POA: Insufficient documentation

## 2018-12-17 DIAGNOSIS — J449 Chronic obstructive pulmonary disease, unspecified: Secondary | ICD-10-CM | POA: Diagnosis not present

## 2018-12-17 DIAGNOSIS — F411 Generalized anxiety disorder: Secondary | ICD-10-CM | POA: Diagnosis not present

## 2018-12-17 DIAGNOSIS — I4891 Unspecified atrial fibrillation: Secondary | ICD-10-CM | POA: Insufficient documentation

## 2018-12-17 DIAGNOSIS — F329 Major depressive disorder, single episode, unspecified: Secondary | ICD-10-CM | POA: Insufficient documentation

## 2018-12-17 DIAGNOSIS — M1712 Unilateral primary osteoarthritis, left knee: Secondary | ICD-10-CM | POA: Diagnosis not present

## 2018-12-17 DIAGNOSIS — Z95810 Presence of automatic (implantable) cardiac defibrillator: Secondary | ICD-10-CM | POA: Diagnosis not present

## 2018-12-17 DIAGNOSIS — Z90710 Acquired absence of both cervix and uterus: Secondary | ICD-10-CM | POA: Insufficient documentation

## 2018-12-17 DIAGNOSIS — E7800 Pure hypercholesterolemia, unspecified: Secondary | ICD-10-CM | POA: Insufficient documentation

## 2018-12-17 DIAGNOSIS — I4892 Unspecified atrial flutter: Secondary | ICD-10-CM | POA: Diagnosis not present

## 2018-12-17 DIAGNOSIS — Z79899 Other long term (current) drug therapy: Secondary | ICD-10-CM | POA: Insufficient documentation

## 2018-12-17 DIAGNOSIS — G4730 Sleep apnea, unspecified: Secondary | ICD-10-CM | POA: Insufficient documentation

## 2018-12-17 DIAGNOSIS — Z7901 Long term (current) use of anticoagulants: Secondary | ICD-10-CM | POA: Insufficient documentation

## 2018-12-17 DIAGNOSIS — Z791 Long term (current) use of non-steroidal anti-inflammatories (NSAID): Secondary | ICD-10-CM | POA: Insufficient documentation

## 2018-12-17 DIAGNOSIS — H409 Unspecified glaucoma: Secondary | ICD-10-CM | POA: Insufficient documentation

## 2018-12-17 HISTORY — DX: Presence of automatic (implantable) cardiac defibrillator: Z95.810

## 2018-12-17 LAB — SURGICAL PCR SCREEN
MRSA, PCR: NEGATIVE
Staphylococcus aureus: POSITIVE — AB

## 2018-12-17 LAB — PROTIME-INR
INR: 1.1 (ref 0.8–1.2)
Prothrombin Time: 14.1 seconds (ref 11.4–15.2)

## 2018-12-17 LAB — CBC WITH DIFFERENTIAL/PLATELET
Abs Immature Granulocytes: 0.02 10*3/uL (ref 0.00–0.07)
Basophils Absolute: 0 10*3/uL (ref 0.0–0.1)
Basophils Relative: 1 %
Eosinophils Absolute: 0.1 10*3/uL (ref 0.0–0.5)
Eosinophils Relative: 1 %
HCT: 44.8 % (ref 36.0–46.0)
Hemoglobin: 14.2 g/dL (ref 12.0–15.0)
Immature Granulocytes: 0 %
Lymphocytes Relative: 28 %
Lymphs Abs: 1.9 10*3/uL (ref 0.7–4.0)
MCH: 31.9 pg (ref 26.0–34.0)
MCHC: 31.7 g/dL (ref 30.0–36.0)
MCV: 100.7 fL — ABNORMAL HIGH (ref 80.0–100.0)
Monocytes Absolute: 0.6 10*3/uL (ref 0.1–1.0)
Monocytes Relative: 9 %
Neutro Abs: 4.1 10*3/uL (ref 1.7–7.7)
Neutrophils Relative %: 61 %
Platelets: 178 10*3/uL (ref 150–400)
RBC: 4.45 MIL/uL (ref 3.87–5.11)
RDW: 12 % (ref 11.5–15.5)
WBC: 6.7 10*3/uL (ref 4.0–10.5)
nRBC: 0 % (ref 0.0–0.2)

## 2018-12-17 LAB — COMPREHENSIVE METABOLIC PANEL
ALT: 15 U/L (ref 0–44)
AST: 16 U/L (ref 15–41)
Albumin: 4.3 g/dL (ref 3.5–5.0)
Alkaline Phosphatase: 93 U/L (ref 38–126)
Anion gap: 7 (ref 5–15)
BUN: 12 mg/dL (ref 8–23)
CO2: 26 mmol/L (ref 22–32)
Calcium: 9.9 mg/dL (ref 8.9–10.3)
Chloride: 105 mmol/L (ref 98–111)
Creatinine, Ser: 0.75 mg/dL (ref 0.44–1.00)
GFR calc Af Amer: >60 mL/min (ref >60)
GFR calc non Af Amer: >60 mL/min (ref >60)
Glucose, Bld: 103 mg/dL — ABNORMAL HIGH (ref 70–99)
Potassium: 5.2 mmol/L — ABNORMAL HIGH (ref 3.5–5.1)
Sodium: 138 mmol/L (ref 135–145)
Total Bilirubin: 0.3 mg/dL (ref 0.3–1.2)
Total Protein: 7.1 g/dL (ref 6.5–8.1)

## 2018-12-17 LAB — APTT: aPTT: 28 seconds (ref 24–36)

## 2018-12-17 MED FILL — MUPIROCIN 2% OINTMENT: 2 | 30 days supply | Qty: 44 | Fill #0

## 2018-12-17 NOTE — Progress Notes (Signed)
PCP - Dr. Ansel Bong Cardiologist - Dr. Rayann Heman  Chest x-ray -  02/08/18 EKG - 12/22/18 Stress Test - no ECHO - no Cardiac Cath - no. Pt has a loop recorder. No a-fib in a year  Sleep Study - yes CPAP - uses it because" studies show it help with A-fib"  Fasting Blood Sugar - NA Checks Blood Sugar _____ times a day  Blood Thinner Instructions:stop Eliquis 3 days prior to DOS Aspirin Instructions:Dr. Archie Endo office Last Dose:12/24/18  Anesthesia review:   Patient denies shortness of breath, fever, cough and chest pain at PAT appointment  yes Patient verbalized understanding of instructions that were given to them at the PAT appointment. Patient was also instructed that they will need to review over the PAT instructions again at home before surgery. yes

## 2018-12-18 LAB — URINE CULTURE: Culture: NO GROWTH

## 2018-12-20 NOTE — Anesthesia Preprocedure Evaluation (Addendum)
Anesthesia Evaluation  Patient identified by MRN, date of birth, ID band Patient awake    Reviewed: Allergy & Precautions, H&P , NPO status , Patient's Chart, lab work & pertinent test results, reviewed documented beta blocker date and time   History of Anesthesia Complications (+) PONV and history of anesthetic complications  Airway Mallampati: III  TM Distance: <3 FB Neck ROM: full  Mouth opening: Limited Mouth Opening  Dental no notable dental hx. (+) Teeth Intact, Dental Advisory Given   Pulmonary asthma , sleep apnea and Continuous Positive Airway Pressure Ventilation , COPD,  COPD inhaler, Current Smoker and Patient abstained from smoking.,    Pulmonary exam normal breath sounds clear to auscultation       Cardiovascular Exercise Tolerance: Good + dysrhythmias + Cardiac Defibrillator  Rhythm:regular Rate:Normal  12/21/17 Rate 58 bpm Sinus bradycardia Nonspecific ST and T wave abnormality   CV: Myocardial Perfusion 01/02/2009  1. No reversible ischemia or infarction. 2. Normal wall motion. 3. Left ventricular ejection fraction equal 55 %   Neuro/Psych  Headaches, PSYCHIATRIC DISORDERS Anxiety Depression    GI/Hepatic negative GI ROS, Neg liver ROS,   Endo/Other  negative endocrine ROS  Renal/GU negative Renal ROS  negative genitourinary   Musculoskeletal   Abdominal   Peds  Hematology negative hematology ROS (+)   Anesthesia Other Findings   Reproductive/Obstetrics negative OB ROS                           Anesthesia Physical Anesthesia Plan  ASA: III  Anesthesia Plan: MAC and Spinal   Post-op Pain Management:  [facility] for Post-op pain   Induction:   PONV Risk Score and Plan: 2  Airway Management Planned:   Additional Equipment:   Intra-op Plan:   Post-operative Plan:   Informed Consent: I have reviewed the patients History and Physical, chart, labs and  discussed the procedure including the risks, benefits and alternatives for the proposed anesthesia with the patient or authorized representative who has indicated his/her understanding and acceptance.     Dental Advisory Given  Plan Discussed with: CRNA, Anesthesiologist and Surgeon  Anesthesia Plan Comments: (See PAT note 12/17/2018, Konrad Felix, PA-C)       Anesthesia Quick Evaluation

## 2018-12-20 NOTE — Progress Notes (Signed)
Anesthesia Chart Review   Case: [id number] Date/Time: 12/27/18 0700   Procedure: TOTAL KNEE ARTHROPLASTY (Left Knee)   Anesthesia type: Spinal   Pre-op diagnosis: djd left knee   Location: [facility] ROOM 07 / [facility]   Surgeon: Elsie Saas, MD      DISCUSSION:68 y.o. current every day smoker (37.5 pack years) with h/o PONV, atrial fibrillation/flutter s/p ablation (on Eliquis), sleep apnea w/CPAP, COPD, asthma, GERD, left knee djd scheduled for above procedure 12/27/2018 with Dr. Elsie Saas.   Pt last seen by cardiologist, Dr. Thompson Grayer, 10/27/2018.  Per OV note, "She is pending left total knee replacement Calcium score was 0 by CT scan in 2018 She has no chest pain or shortness of breath with activity She is at acceptable cardiac risk for surgery Ok to hold Eliquis for surgery."  Reports last dose of Eliquis 12/24/2018.   Anticipate pt can proceed with planned procedure barring acute status change.   VS: BP (!) (P) 121/49 (BP Location: Right Arm)   Pulse 64   Temp 37 C (Oral)   Resp 18   Ht 5\' 7"  (1.702 m)   Wt 69.5 kg   SpO2 98%   BMI 24.01 kg/m   PROVIDERS: Marin Olp, MD is PCP   Thompson Grayer, MD is Cardiologist  LABS: Labs reviewed: Acceptable for surgery. (all labs ordered are listed, but only abnormal results are displayed)  Labs Reviewed  SURGICAL PCR SCREEN - Abnormal; Notable for the following components:      Result Value   Staphylococcus aureus POSITIVE (*)    All other components within normal limits  CBC WITH DIFFERENTIAL/PLATELET - Abnormal; Notable for the following components:   MCV 100.7 (*)    All other components within normal limits  COMPREHENSIVE METABOLIC PANEL - Abnormal; Notable for the following components:   Potassium 5.2 (*)    Glucose, Bld 103 (*)    All other components within normal limits  URINE CULTURE  APTT  PROTIME-INR     IMAGES:   EKG: 12/21/17 Rate 58 bpm Sinus bradycardia Nonspecific ST and T wave  abnormality   CV: Myocardial Perfusion 01/02/2009 IMPRESSION:   1.  No reversible ischemia or infarction. 2.  Normal wall motion. 3.  Left ventricular ejection fraction equal 55 % Past Medical History:  Diagnosis Date  . AICD (automatic cardioverter/defibrillator) present    loop recorder  . Anxiety   . Anxiety state 12/14/2018  . Asthma   . Atrial fibrillation ([place])    s/p ablations  . Atrial flutter ([place])   . COPD (chronic obstructive pulmonary disease) ([place])   . Degenerative arthritis    "maybe in my left knee" (12/16/2016)  . Depression   . Dysrhythmia 2010   A-fib  . Frozen shoulder    hx bilaterally; "had to go thru PT; no problem w/them since I learned exercises" (12/16/2016)  . GERD (gastroesophageal reflux disease)   . Glaucoma   . H/O: rheumatic fever 1957  . High cholesterol   . Migraine    "getting botox now; now only 2-3/month" (12/16/2016)  . PONV (postoperative nausea and vomiting)    with Fentanyl,  Dilaudid &  Propofol (12/16/2016)    Past Surgical History:  Procedure Laterality Date  . ABDOMINAL HYSTERECTOMY  1990   right ovary removed  . APPENDECTOMY  1990  . ATRIAL FIBRILLATION ABLATION N/A 12/16/2016   Procedure: ATRIAL FIBRILLATION ABLATION;  Surgeon: Thompson Grayer, MD;  Location: [facility];  Service: Cardiovascular;  Laterality: N/A;  . CATARACT EXTRACTION, BILATERAL    . CTI ablation  3/11  . ESOPHAGOGASTRODUODENOSCOPY (EGD) WITH PROPOFOL N/A 01/06/2013   Procedure: ESOPHAGOGASTRODUODENOSCOPY (EGD) WITH PROPOFOL;  Surgeon: Cleotis Nipper, MD;  Location: [facility];  Service: Endoscopy;  Laterality: N/A;  . KNEE ARTHROSCOPY Bilateral 1999-2006  . KNEE SURGERY Bilateral [id number]   "open; for congenital deformity"  . LOOP RECORDER INSERTION N/A 10/02/2016   Procedure: LOOP RECORDER INSERTION;  Surgeon: Thompson Grayer, MD;  Location: [facility];  Service: Cardiovascular;  Laterality: N/A;  . PERIPHERAL VASCULAR INTERVENTION   2/11  & 6/11  . RHINOPLASTY  1965  . TONSILLECTOMY  as child  . TOOTH EXTRACTION  2009   2 teeth extractted    MEDICATIONS: . acetaminophen (TYLENOL) 325 MG tablet  . albuterol (VENTOLIN HFA) 108 (90 Base) MCG/ACT inhaler  . ALPRAZolam (XANAX) 0.5 MG tablet  . BOTOX 200 units SOLR  . buPROPion (WELLBUTRIN XL) 300 MG 24 hr tablet  . butalbital-acetaminophen-caffeine (FIORICET, ESGIC) 50-325-40 MG tablet  . diclofenac sodium (VOLTAREN) 1 % GEL  . dorzolamide-timolol (COSOPT) 22.3-6.8 MG/ML ophthalmic solution  . ELIQUIS 5 MG TABS tablet  . Hypromellose (ARTIFICIAL TEARS OP)  . Probiotic Product (ALIGN PO)  . promethazine (PHENERGAN) 25 MG tablet  . rosuvastatin (CRESTOR) 10 MG tablet  . sertraline (ZOLOFT) 100 MG tablet  . Simethicone (GAS-X PO)  . sodium chloride (OCEAN) 0.65 % SOLN nasal spray  . SUMAtriptan (IMITREX) 100 MG tablet  . Vitamin D, Ergocalciferol, (DRISDOL) 1.25 MG (50000 UT) CAPS capsule   No current facility-administered medications for this encounter.    Maia Plan [facility] Pre-Surgical Testing [phone] 12/20/18  2:38 PM

## 2018-12-21 MED FILL — PROAIR HFA 90 MCG INHALER: 108 (90 BAS | 25 days supply | Qty: 9 | Fill #1

## 2018-12-22 ENCOUNTER — Ambulatory Visit (INDEPENDENT_AMBULATORY_CARE_PROVIDER_SITE_OTHER): Payer: Medicare Other | Admitting: *Deleted

## 2018-12-22 DIAGNOSIS — I480 Paroxysmal atrial fibrillation: Secondary | ICD-10-CM

## 2018-12-23 ENCOUNTER — Other Ambulatory Visit (HOSPITAL_COMMUNITY)
Admission: RE | Admit: 2018-12-23 | Discharge: 2018-12-23 | Disposition: A | Discharge status: Home / Self Care | Payer: Medicare Other | Source: Ambulatory Visit | Attending: Orthopedic Surgery | Admitting: Orthopedic Surgery

## 2018-12-23 DIAGNOSIS — Z20828 Contact with and (suspected) exposure to other viral communicable diseases: Secondary | ICD-10-CM | POA: Diagnosis not present

## 2018-12-23 DIAGNOSIS — Z01812 Encounter for preprocedural laboratory examination: Secondary | ICD-10-CM | POA: Insufficient documentation

## 2018-12-23 LAB — CUP PACEART REMOTE DEVICE CHECK
Date Time Interrogation Session: 20201118111635
Implantable Pulse Generator Implant Date: 20180830

## 2018-12-24 LAB — NOVEL CORONAVIRUS, NAA (HOSP ORDER, SEND-OUT TO REF LAB; TAT 18-24 HRS): SARS-CoV-2, NAA: NOT DETECTED

## 2018-12-26 MED ORDER — BUPIVACAINE LIPOSOME 1.3 % IJ SUSP
20.0000 mL | Freq: Once | INTRAMUSCULAR | Status: DC
  Filled 2018-12-26: qty 20

## 2018-12-27 ENCOUNTER — Encounter: Payer: Medicare Other | Admitting: Internal Medicine

## 2018-12-27 ENCOUNTER — Other Ambulatory Visit: Payer: Self-pay

## 2018-12-27 ENCOUNTER — Ambulatory Visit (HOSPITAL_COMMUNITY): Payer: Medicare Other | Admitting: Physician Assistant

## 2018-12-27 ENCOUNTER — Encounter (HOSPITAL_COMMUNITY): Payer: Self-pay

## 2018-12-27 ENCOUNTER — Observation Stay (HOSPITAL_COMMUNITY)
Admission: RE | Admit: 2018-12-27 | Discharge: 2018-12-28 | Disposition: A | Discharge status: Home / Self Care | Payer: Medicare Other | Attending: Orthopedic Surgery | Admitting: Orthopedic Surgery

## 2018-12-27 ENCOUNTER — Ambulatory Visit (HOSPITAL_COMMUNITY): Payer: Medicare Other | Admitting: Anesthesiology

## 2018-12-27 ENCOUNTER — Encounter (HOSPITAL_COMMUNITY)
Admission: RE | Disposition: A | Discharge status: Home / Self Care | Payer: Self-pay | Source: Home / Self Care | Attending: Orthopedic Surgery

## 2018-12-27 DIAGNOSIS — Z95810 Presence of automatic (implantable) cardiac defibrillator: Secondary | ICD-10-CM | POA: Diagnosis not present

## 2018-12-27 DIAGNOSIS — Z881 Allergy status to other antibiotic agents status: Secondary | ICD-10-CM | POA: Diagnosis not present

## 2018-12-27 DIAGNOSIS — G43719 Chronic migraine without aura, intractable, without status migrainosus: Secondary | ICD-10-CM | POA: Diagnosis present

## 2018-12-27 DIAGNOSIS — J449 Chronic obstructive pulmonary disease, unspecified: Secondary | ICD-10-CM | POA: Insufficient documentation

## 2018-12-27 DIAGNOSIS — Z79899 Other long term (current) drug therapy: Secondary | ICD-10-CM | POA: Insufficient documentation

## 2018-12-27 DIAGNOSIS — F419 Anxiety disorder, unspecified: Secondary | ICD-10-CM | POA: Diagnosis not present

## 2018-12-27 DIAGNOSIS — E559 Vitamin D deficiency, unspecified: Secondary | ICD-10-CM | POA: Diagnosis not present

## 2018-12-27 DIAGNOSIS — E7800 Pure hypercholesterolemia, unspecified: Secondary | ICD-10-CM | POA: Diagnosis not present

## 2018-12-27 DIAGNOSIS — G4730 Sleep apnea, unspecified: Secondary | ICD-10-CM | POA: Insufficient documentation

## 2018-12-27 DIAGNOSIS — H409 Unspecified glaucoma: Secondary | ICD-10-CM | POA: Diagnosis not present

## 2018-12-27 DIAGNOSIS — F329 Major depressive disorder, single episode, unspecified: Secondary | ICD-10-CM | POA: Diagnosis not present

## 2018-12-27 DIAGNOSIS — I480 Paroxysmal atrial fibrillation: Secondary | ICD-10-CM | POA: Diagnosis present

## 2018-12-27 DIAGNOSIS — F325 Major depressive disorder, single episode, in full remission: Secondary | ICD-10-CM | POA: Diagnosis present

## 2018-12-27 DIAGNOSIS — F411 Generalized anxiety disorder: Secondary | ICD-10-CM | POA: Diagnosis present

## 2018-12-27 DIAGNOSIS — M1712 Unilateral primary osteoarthritis, left knee: Principal | ICD-10-CM | POA: Insufficient documentation

## 2018-12-27 DIAGNOSIS — Z885 Allergy status to narcotic agent status: Secondary | ICD-10-CM | POA: Insufficient documentation

## 2018-12-27 DIAGNOSIS — G43909 Migraine, unspecified, not intractable, without status migrainosus: Secondary | ICD-10-CM | POA: Insufficient documentation

## 2018-12-27 DIAGNOSIS — F17210 Nicotine dependence, cigarettes, uncomplicated: Secondary | ICD-10-CM | POA: Diagnosis not present

## 2018-12-27 DIAGNOSIS — R7989 Other specified abnormal findings of blood chemistry: Secondary | ICD-10-CM | POA: Diagnosis present

## 2018-12-27 HISTORY — PX: TOTAL KNEE ARTHROPLASTY: SHX125

## 2018-12-27 SURGERY — ARTHROPLASTY, KNEE, TOTAL
Anesthesia: Monitor Anesthesia Care | Site: Knee | Laterality: Left

## 2018-12-27 MED ORDER — BUPIVACAINE-EPINEPHRINE 0.25% -1:200000 IJ SOLN
INTRAMUSCULAR | Status: DC | PRN
  Administered 2018-12-27: 30 mL

## 2018-12-27 MED ORDER — ROPIVACAINE HCL 7.5 MG/ML IJ SOLN
INTRAMUSCULAR | Status: DC | PRN
  Administered 2018-12-27: 30 mL via PERINEURAL

## 2018-12-27 MED ORDER — MIDAZOLAM HCL 2 MG/2ML IJ SOLN
INTRAMUSCULAR | Status: AC
  Filled 2018-12-27: qty 2

## 2018-12-27 MED ORDER — CHLORHEXIDINE GLUCONATE 4 % EX LIQD: 60.0000 mL | Freq: Once | CUTANEOUS | Status: DC

## 2018-12-27 MED ORDER — PROMETHAZINE HCL 25 MG/ML IJ SOLN
INTRAMUSCULAR | Status: AC
  Filled 2018-12-27: qty 1

## 2018-12-27 MED ORDER — DEXAMETHASONE SODIUM PHOSPHATE 10 MG/ML IJ SOLN
10.0000 mg | Freq: Three times a day (TID) | INTRAMUSCULAR | Status: DC
  Administered 2018-12-27 – 2018-12-28 (×3): 10 mg via INTRAVENOUS
  Filled 2018-12-27 (×3): qty 1

## 2018-12-27 MED ORDER — LACTATED RINGERS IV SOLN
INTRAVENOUS | Status: DC
  Administered 2018-12-27 (×2): via INTRAVENOUS

## 2018-12-27 MED ORDER — ONDANSETRON HCL 4 MG/2ML IJ SOLN
INTRAMUSCULAR | Status: AC
  Filled 2018-12-27: qty 2

## 2018-12-27 MED ORDER — ACETAMINOPHEN 10 MG/ML IV SOLN
INTRAVENOUS | Status: DC | PRN
  Administered 2018-12-27: 1000 mg via INTRAVENOUS

## 2018-12-27 MED ORDER — CEFAZOLIN SODIUM-DEXTROSE 2-4 GM/100ML-% IV SOLN
2.0000 g | INTRAVENOUS | Status: AC
  Administered 2018-12-27: 2 g via INTRAVENOUS

## 2018-12-27 MED ORDER — ONDANSETRON HCL 4 MG/2ML IJ SOLN
INTRAMUSCULAR | Status: DC | PRN
  Administered 2018-12-27: 4 mg via INTRAVENOUS

## 2018-12-27 MED ORDER — FENTANYL CITRATE (PF) 100 MCG/2ML IJ SOLN: 25.0000 ug | INTRAMUSCULAR | Status: DC | PRN

## 2018-12-27 MED ORDER — APIXABAN 2.5 MG PO TABS
2.5000 mg | ORAL_TABLET | Freq: Two times a day (BID) | ORAL | Status: DC
  Administered 2018-12-28: 2.5 mg via ORAL
  Filled 2018-12-27: qty 1

## 2018-12-27 MED ORDER — SODIUM CHLORIDE 0.9 % IR SOLN
Status: DC | PRN
  Administered 2018-12-27 (×2): 1000 mL

## 2018-12-27 MED ORDER — METOCLOPRAMIDE HCL 5 MG PO TABS
5.0000 mg | ORAL_TABLET | Freq: Three times a day (TID) | ORAL | Status: DC | PRN
  Administered 2018-12-28: 10 mg via ORAL
  Filled 2018-12-27: qty 2

## 2018-12-27 MED ORDER — POVIDONE-IODINE 7.5 % EX SOLN: Freq: Once | CUTANEOUS | Status: DC

## 2018-12-27 MED ORDER — MEPERIDINE HCL 50 MG/ML IJ SOLN: 6.2500 mg | INTRAMUSCULAR | Status: DC | PRN

## 2018-12-27 MED ORDER — HYDROMORPHONE HCL 1 MG/ML IJ SOLN: 0.5000 mg | INTRAMUSCULAR | Status: DC | PRN

## 2018-12-27 MED ORDER — WATER FOR IRRIGATION, STERILE IR SOLN
Status: DC | PRN
  Administered 2018-12-27: 2000 mL

## 2018-12-27 MED ORDER — CEFAZOLIN SODIUM-DEXTROSE 2-4 GM/100ML-% IV SOLN
2.0000 g | Freq: Four times a day (QID) | INTRAVENOUS | Status: AC
  Administered 2018-12-27 (×2): 2 g via INTRAVENOUS
  Filled 2018-12-27 (×2): qty 100

## 2018-12-27 MED ORDER — TRANEXAMIC ACID-NACL 1000-0.7 MG/100ML-% IV SOLN
INTRAVENOUS | Status: AC
  Filled 2018-12-27: qty 100

## 2018-12-27 MED ORDER — DIPHENHYDRAMINE HCL 12.5 MG/5ML PO ELIX: 12.5000 mg | ORAL_SOLUTION | ORAL | Status: DC | PRN

## 2018-12-27 MED ORDER — SERTRALINE HCL 100 MG PO TABS
100.0000 mg | ORAL_TABLET | Freq: Every day | ORAL | Status: DC
  Administered 2018-12-28: 100 mg via ORAL
  Filled 2018-12-27: qty 1

## 2018-12-27 MED ORDER — PHENOL 1.4 % MT LIQD: 1.0000 | OROMUCOSAL | Status: DC | PRN

## 2018-12-27 MED ORDER — POLYETHYLENE GLYCOL 3350 17 G PO PACK
17.0000 g | PACK | Freq: Two times a day (BID) | ORAL | Status: DC
  Administered 2018-12-28: 17 g via ORAL
  Filled 2018-12-27 (×2): qty 1

## 2018-12-27 MED ORDER — ACETAMINOPHEN 160 MG/5ML PO SOLN: 325.0000 mg | ORAL | Status: DC | PRN

## 2018-12-27 MED ORDER — MENTHOL 3 MG MT LOZG: 1.0000 | LOZENGE | OROMUCOSAL | Status: DC | PRN

## 2018-12-27 MED ORDER — SODIUM CHLORIDE 0.9 % IV BOLUS
500.0000 mL | Freq: Once | INTRAVENOUS | Status: AC
  Administered 2018-12-27: 500 mL via INTRAVENOUS

## 2018-12-27 MED ORDER — 0.9 % SODIUM CHLORIDE (POUR BTL) OPTIME
TOPICAL | Status: DC | PRN
  Administered 2018-12-27: 08:00:00 1000 mL

## 2018-12-27 MED ORDER — ALUM & MAG HYDROXIDE-SIMETH 200-200-20 MG/5ML PO SUSP: 30.0000 mL | ORAL | Status: DC | PRN

## 2018-12-27 MED ORDER — PROPOFOL 500 MG/50ML IV EMUL
INTRAVENOUS | Status: AC
  Filled 2018-12-27: qty 50

## 2018-12-27 MED ORDER — SUMATRIPTAN SUCCINATE 6 MG/0.5ML ~~LOC~~ SOLN
6.0000 mg | Freq: Once | SUBCUTANEOUS | Status: AC
  Administered 2018-12-27: 6 mg via SUBCUTANEOUS
  Filled 2018-12-27: qty 0.5

## 2018-12-27 MED ORDER — CEFUROXIME SODIUM 1.5 G IV SOLR
INTRAVENOUS | Status: DC | PRN
  Administered 2018-12-27: 1.5 g via INTRAVENOUS

## 2018-12-27 MED ORDER — POTASSIUM CHLORIDE IN NACL 20-0.9 MEQ/L-% IV SOLN
INTRAVENOUS | Status: DC
  Administered 2018-12-27 – 2018-12-28 (×2): via INTRAVENOUS
  Filled 2018-12-27 (×3): qty 1000

## 2018-12-27 MED ORDER — ONDANSETRON HCL 4 MG PO TABS: 4.0000 mg | ORAL_TABLET | Freq: Four times a day (QID) | ORAL | Status: DC | PRN

## 2018-12-27 MED ORDER — OXYCODONE HCL 5 MG PO TABS: 5.0000 mg | ORAL_TABLET | Freq: Once | ORAL | Status: DC | PRN

## 2018-12-27 MED ORDER — POVIDONE-IODINE 10 % EX SWAB: 2.0000 "application " | Freq: Once | CUTANEOUS | Status: DC

## 2018-12-27 MED ORDER — BUPIVACAINE-EPINEPHRINE 0.25% -1:200000 IJ SOLN
INTRAMUSCULAR | Status: AC
  Filled 2018-12-27: qty 1

## 2018-12-27 MED ORDER — SODIUM CHLORIDE (PF) 0.9 % IJ SOLN
INTRAMUSCULAR | Status: DC | PRN
  Administered 2018-12-27: 50 mL

## 2018-12-27 MED ORDER — ALPRAZOLAM 0.5 MG PO TABS: 0.5000 mg | ORAL_TABLET | Freq: Every evening | ORAL | Status: DC | PRN

## 2018-12-27 MED ORDER — DEXAMETHASONE SODIUM PHOSPHATE 10 MG/ML IJ SOLN: 8.0000 mg | Freq: Once | INTRAMUSCULAR | Status: DC

## 2018-12-27 MED ORDER — SUMATRIPTAN SUCCINATE 50 MG PO TABS
100.0000 mg | ORAL_TABLET | ORAL | Status: DC | PRN
  Filled 2018-12-27: qty 2

## 2018-12-27 MED ORDER — OXYCODONE HCL 5 MG PO TABS
5.0000 mg | ORAL_TABLET | ORAL | Status: DC | PRN
  Administered 2018-12-27: 10 mg via ORAL
  Administered 2018-12-27: 5 mg via ORAL
  Administered 2018-12-28: 10 mg via ORAL
  Administered 2018-12-28: 5 mg via ORAL
  Administered 2018-12-28: 10 mg via ORAL
  Filled 2018-12-27: qty 2
  Filled 2018-12-27: qty 1
  Filled 2018-12-27 (×3): qty 2

## 2018-12-27 MED ORDER — SODIUM CHLORIDE (PF) 0.9 % IJ SOLN
INTRAMUSCULAR | Status: AC
  Filled 2018-12-27: qty 50

## 2018-12-27 MED ORDER — DOCUSATE SODIUM 100 MG PO CAPS
100.0000 mg | ORAL_CAPSULE | Freq: Two times a day (BID) | ORAL | Status: DC
  Administered 2018-12-27 – 2018-12-28 (×2): 100 mg via ORAL
  Filled 2018-12-27 (×2): qty 1

## 2018-12-27 MED ORDER — PHENYLEPHRINE HCL-NACL 10-0.9 MG/250ML-% IV SOLN
INTRAVENOUS | Status: DC | PRN
  Administered 2018-12-27: 25 ug/min via INTRAVENOUS

## 2018-12-27 MED ORDER — ACETAMINOPHEN 500 MG PO TABS
1000.0000 mg | ORAL_TABLET | Freq: Four times a day (QID) | ORAL | Status: AC
  Administered 2018-12-27 – 2018-12-28 (×4): 1000 mg via ORAL
  Filled 2018-12-27 (×4): qty 2

## 2018-12-27 MED ORDER — BUPIVACAINE IN DEXTROSE 0.75-8.25 % IT SOLN
INTRATHECAL | Status: DC | PRN
  Administered 2018-12-27: 1.6 mL via INTRATHECAL

## 2018-12-27 MED ORDER — ONDANSETRON HCL 4 MG/2ML IJ SOLN: 4.0000 mg | Freq: Four times a day (QID) | INTRAMUSCULAR | Status: DC | PRN

## 2018-12-27 MED ORDER — OXYCODONE HCL 5 MG/5ML PO SOLN: 5.0000 mg | Freq: Once | ORAL | Status: DC | PRN

## 2018-12-27 MED ORDER — CEFUROXIME SODIUM 1.5 G IV SOLR
INTRAVENOUS | Status: AC
  Filled 2018-12-27: qty 1.5

## 2018-12-27 MED ORDER — DEXAMETHASONE SODIUM PHOSPHATE 10 MG/ML IJ SOLN
INTRAMUSCULAR | Status: DC | PRN
  Administered 2018-12-27: 8 mg via INTRAVENOUS

## 2018-12-27 MED ORDER — ONDANSETRON HCL 4 MG/2ML IJ SOLN
4.0000 mg | Freq: Once | INTRAMUSCULAR | Status: AC | PRN
  Administered 2018-12-27: 4 mg via INTRAVENOUS

## 2018-12-27 MED ORDER — PROMETHAZINE HCL 25 MG/ML IJ SOLN
6.2500 mg | Freq: Once | INTRAMUSCULAR | Status: AC
  Administered 2018-12-27: 6.25 mg via INTRAVENOUS

## 2018-12-27 MED ORDER — PROPOFOL 10 MG/ML IV BOLUS
INTRAVENOUS | Status: AC
  Filled 2018-12-27: qty 20

## 2018-12-27 MED ORDER — ALBUTEROL SULFATE (2.5 MG/3ML) 0.083% IN NEBU: 2.5000 mg | INHALATION_SOLUTION | Freq: Four times a day (QID) | RESPIRATORY_TRACT | Status: DC | PRN

## 2018-12-27 MED ORDER — ACETAMINOPHEN 10 MG/ML IV SOLN
INTRAVENOUS | Status: AC
  Filled 2018-12-27: qty 100

## 2018-12-27 MED ORDER — METOCLOPRAMIDE HCL 5 MG/ML IJ SOLN: 5.0000 mg | Freq: Three times a day (TID) | INTRAMUSCULAR | Status: DC | PRN

## 2018-12-27 MED ORDER — ACETAMINOPHEN 325 MG PO TABS: 325.0000 mg | ORAL_TABLET | ORAL | Status: DC | PRN

## 2018-12-27 MED ORDER — BUPIVACAINE LIPOSOME 1.3 % IJ SUSP
INTRAMUSCULAR | Status: DC | PRN
  Administered 2018-12-27: 20 mL

## 2018-12-27 MED ORDER — GABAPENTIN 300 MG PO CAPS
300.0000 mg | ORAL_CAPSULE | Freq: Every day | ORAL | Status: DC
  Administered 2018-12-27: 300 mg via ORAL
  Filled 2018-12-27: qty 1

## 2018-12-27 MED ORDER — PROPOFOL 10 MG/ML IV BOLUS
INTRAVENOUS | Status: DC | PRN
  Administered 2018-12-27: 40 mg via INTRAVENOUS
  Administered 2018-12-27: 30 mg via INTRAVENOUS

## 2018-12-27 MED ORDER — TRANEXAMIC ACID-NACL 1000-0.7 MG/100ML-% IV SOLN
1000.0000 mg | INTRAVENOUS | Status: AC
  Administered 2018-12-27: 1000 mg via INTRAVENOUS

## 2018-12-27 MED ORDER — PROPOFOL 500 MG/50ML IV EMUL
INTRAVENOUS | Status: DC | PRN
  Administered 2018-12-27: 75 ug/kg/min via INTRAVENOUS

## 2018-12-27 MED ORDER — MIDAZOLAM HCL 2 MG/2ML IJ SOLN
INTRAMUSCULAR | Status: DC | PRN
  Administered 2018-12-27: 2 mg via INTRAVENOUS

## 2018-12-27 MED ORDER — DEXAMETHASONE SODIUM PHOSPHATE 4 MG/ML IJ SOLN
INTRAMUSCULAR | Status: DC | PRN
  Administered 2018-12-27: 10 mg via INTRAVENOUS

## 2018-12-27 MED ORDER — FENTANYL CITRATE (PF) 250 MCG/5ML IJ SOLN
INTRAMUSCULAR | Status: AC
  Filled 2018-12-27: qty 5

## 2018-12-27 MED ORDER — CEFAZOLIN SODIUM-DEXTROSE 2-4 GM/100ML-% IV SOLN
INTRAVENOUS | Status: AC
  Filled 2018-12-27: qty 100

## 2018-12-27 MED ORDER — BUPROPION HCL ER (XL) 300 MG PO TB24
300.0000 mg | ORAL_TABLET | Freq: Every day | ORAL | Status: DC
  Administered 2018-12-28: 300 mg via ORAL
  Filled 2018-12-27: qty 1

## 2018-12-27 MED ORDER — DORZOLAMIDE HCL-TIMOLOL MAL 2-0.5 % OP SOLN
1.0000 [drp] | Freq: Two times a day (BID) | OPHTHALMIC | Status: DC
  Administered 2018-12-27 – 2018-12-28 (×2): 1 [drp] via OPHTHALMIC
  Filled 2018-12-27: qty 10

## 2018-12-27 SURGICAL SUPPLY — 63 items
APL PRP STRL LF DISP 70% ISPRP (MISCELLANEOUS) ×2
ATTUNE PS FEM LT SZ 7 CEM KNEE (Femur) ×1 IMPLANT
ATTUNE PSRP INSR SZ7 7 KNEE (Insert) ×1 IMPLANT
BAG SPEC THK2 15X12 ZIP CLS (MISCELLANEOUS) ×1
BAG ZIPLOCK 12X15 (MISCELLANEOUS) ×2 IMPLANT
BASE TIBIAL ROT PLAT SZ 7 KNEE (Knees) IMPLANT
BLADE HEX COATED 2.75 (ELECTRODE) ×2 IMPLANT
BLADE SAGITTAL 25.0X1.19X90 (BLADE) ×2 IMPLANT
BLADE SAW SGTL 13X75X1.27 (BLADE) ×2 IMPLANT
BNDG CMPR MED 10X6 ELC LF (GAUZE/BANDAGES/DRESSINGS) ×1
BNDG ELASTIC 6X10 VLCR STRL LF (GAUZE/BANDAGES/DRESSINGS) ×2 IMPLANT
BOWL SMART MIX CTS (DISPOSABLE) ×2 IMPLANT
BSPLAT TIB 7 CMNT ROT PLAT STR (Knees) ×1 IMPLANT
CEMENT HV SMART SET (Cement) ×4 IMPLANT
CHLORAPREP W/TINT 26 (MISCELLANEOUS) ×4 IMPLANT
COVER SURGICAL LIGHT HANDLE (MISCELLANEOUS) ×2 IMPLANT
COVER WAND RF STERILE (DRAPES) IMPLANT
CUFF TOURN SGL QUICK 34 (TOURNIQUET CUFF) ×2
CUFF TRNQT CYL 34X4.125X (TOURNIQUET CUFF) ×1 IMPLANT
DECANTER SPIKE VIAL GLASS SM (MISCELLANEOUS) ×4 IMPLANT
DRAPE ORTHO SPLIT 77X108 STRL (DRAPES) ×2
DRAPE SHEET LG 3/4 BI-LAMINATE (DRAPES) ×2 IMPLANT
DRAPE SURG ORHT 6 SPLT 77X108 (DRAPES) ×1 IMPLANT
DRAPE U-SHAPE 47X51 STRL (DRAPES) ×2 IMPLANT
DRSG AQUACEL AG ADV 3.5X10 (GAUZE/BANDAGES/DRESSINGS) ×2 IMPLANT
ELECT REM PT RETURN 15FT ADLT (MISCELLANEOUS) ×2 IMPLANT
GLOVE BIO SURGEON STRL SZ7 (GLOVE) ×2 IMPLANT
GLOVE BIOGEL PI IND STRL 7.0 (GLOVE) ×1 IMPLANT
GLOVE BIOGEL PI IND STRL 7.5 (GLOVE) ×1 IMPLANT
GLOVE BIOGEL PI INDICATOR 7.0 (GLOVE) ×1
GLOVE BIOGEL PI INDICATOR 7.5 (GLOVE) ×1
GLOVE SS BIOGEL STRL SZ 7.5 (GLOVE) ×1 IMPLANT
GLOVE SUPERSENSE BIOGEL SZ 7.5 (GLOVE) ×1
GOWN STRL REUS W/ TWL LRG LVL3 (GOWN DISPOSABLE) ×2 IMPLANT
GOWN STRL REUS W/TWL LRG LVL3 (GOWN DISPOSABLE) ×4
HANDPIECE INTERPULSE COAX TIP (DISPOSABLE) ×2
HOLDER FOLEY CATH W/STRAP (MISCELLANEOUS) IMPLANT
HOOD PEEL AWAY FLYTE STAYCOOL (MISCELLANEOUS) ×6 IMPLANT
KIT TURNOVER KIT A (KITS) ×2 IMPLANT
MANIFOLD NEPTUNE II (INSTRUMENTS) ×2 IMPLANT
MARKER SKIN DUAL TIP RULER LAB (MISCELLANEOUS) ×2 IMPLANT
NEEDLE HYPO 22GX1.5 SAFETY (NEEDLE) ×2 IMPLANT
NS IRRIG 1000ML POUR BTL (IV SOLUTION) ×2 IMPLANT
PACK TOTAL KNEE CUSTOM (KITS) ×2 IMPLANT
PATELLA MEDIAL ATTUN 35MM KNEE (Knees) ×1 IMPLANT
PENCIL SMOKE EVACUATOR (MISCELLANEOUS) IMPLANT
PIN STEINMAN FIXATION KNEE (PIN) ×1 IMPLANT
PIN THREADED HEADED SIGMA (PIN) ×1 IMPLANT
PROTECTOR NERVE ULNAR (MISCELLANEOUS) ×2 IMPLANT
SET HNDPC FAN SPRY TIP SCT (DISPOSABLE) ×1 IMPLANT
STAPLER VISISTAT 35W (STAPLE) IMPLANT
STRIP CLOSURE SKIN 1/2X4 (GAUZE/BANDAGES/DRESSINGS) ×2 IMPLANT
SUT MNCRL AB 3-0 PS2 18 (SUTURE) ×2 IMPLANT
SUT VIC AB 0 CT1 36 (SUTURE) ×4 IMPLANT
SUT VIC AB 1 CT1 36 (SUTURE) ×2 IMPLANT
SUT VIC AB 2-0 CT1 27 (SUTURE) ×4
SUT VIC AB 2-0 CT1 TAPERPNT 27 (SUTURE) ×2 IMPLANT
SYR CONTROL 10ML LL (SYRINGE) ×4 IMPLANT
TIBIAL BASE ROT PLAT SZ 7 KNEE (Knees) ×2 IMPLANT
TRAY FOLEY MTR SLVR 14FR STAT (SET/KITS/TRAYS/PACK) ×2 IMPLANT
WATER STERILE IRR 1000ML POUR (IV SOLUTION) ×4 IMPLANT
YANKAUER SUCT BULB TIP 10FT TU (MISCELLANEOUS) ×2 IMPLANT
YANKAUER SUCT BULB TIP NO VENT (SUCTIONS) ×2 IMPLANT

## 2018-12-27 NOTE — Anesthesia Procedure Notes (Signed)
Spinal  Patient location during procedure: OR Start time: 12/27/2018 7:16 AM End time: 12/27/2018 7:25 AM Staffing Anesthesiologist: Janeece Riggers, MD Preanesthetic Checklist Completed: patient identified, site marked, surgical consent, pre-op evaluation, timeout performed, IV checked, risks and benefits discussed and monitors and equipment checked Spinal Block Patient position: sitting Prep: DuraPrep Patient monitoring: heart rate, cardiac monitor, continuous pulse ox and blood pressure Approach: midline Location: L3-4 Injection technique: single-shot Needle Needle type: Sprotte  Needle gauge: 24 G Needle length: 9 cm Assessment Sensory level: T4

## 2018-12-27 NOTE — Interval H&P Note (Signed)
History and Physical Interval Note:  12/27/2018 7:04 AM  Suzanne [name]  has presented today for surgery, with the diagnosis of djd left knee.  The various methods of treatment have been discussed with the patient and family. After consideration of risks, benefits and other options for treatment, the patient has consented to  Procedure(s): TOTAL KNEE ARTHROPLASTY (Left) as a surgical intervention.  The patient's history has been reviewed, patient examined, no change in status, stable for surgery.  I have reviewed the patient's chart and labs.  Questions were answered to the patient's satisfaction.     Lorn Junes

## 2018-12-27 NOTE — Evaluation (Signed)
Physical Therapy Evaluation Patient Details Name: Suzanne [name] MRN: [id number] DOB: August 17, 1950 Today's Date: 12/27/2018   History of Present Illness  s/p L TKA  Clinical Impression  Pt is s/p TKA resulting in the deficits listed below (see PT Problem List).  Pt should continue to progress well. amb ~ 31' with RW and min/guard assist   Pt will benefit from skilled PT to increase their independence and safety with mobility to allow discharge to the venue listed below.      Follow Up Recommendations Follow surgeon's recommendation for DC plan and follow-up therapies    Equipment Recommendations       Recommendations for Other Services       Precautions / Restrictions Precautions Precautions: Fall Required Braces or Orthoses: Knee Immobilizer - Left Restrictions Weight Bearing Restrictions: No Other Position/Activity Restrictions: WBAT      Mobility  Bed Mobility Overal bed mobility: Needs Assistance Bed Mobility: Supine to Sit     Supine to sit: Min guard     General bed mobility comments: for safety  Transfers Overall transfer level: Needs assistance Equipment used: Rolling walker (2 wheeled) Transfers: Sit to/from Stand Sit to Stand: Min guard         General transfer comment: cues for hand placement and LLE position  Ambulation/Gait Ambulation/Gait assistance: Min guard;Min assist Gait Distance (Feet): 65 Feet Assistive device: Rolling walker (2 wheeled) Gait Pattern/deviations: Step-to pattern;Decreased stance time - left     General Gait Details: cues for sequence  Stairs            Wheelchair Mobility    Modified Rankin (Stroke Patients Only)       Balance                                             Pertinent Vitals/Pain Pain Assessment: 0-10 Pain Score: 3  Pain Location: L knee Pain Descriptors / Indicators: Grimacing;Sore Pain Intervention(s): Premedicated before session;Monitored during session;Limited  activity within patient's tolerance;Repositioned    Home Living Family/patient expects to be discharged to:: Private residence Living Arrangements: Spouse/significant other Available Help at Discharge: Family Type of Home: House Home Access: Stairs to enter   Technical brewer of Steps: 5 Home Layout: Two level;Able to live on main level with bedroom/bathroom        Prior Function Level of Independence: Independent               Hand Dominance        Extremity/Trunk Assessment   Upper Extremity Assessment Upper Extremity Assessment: Defer to OT evaluation    Lower Extremity Assessment Lower Extremity Assessment: LLE deficits/detail LLE Deficits / Details: ankle WFL, knee extension and hip flexion 2+ to 3/5 without quad lag       Communication   Communication: No difficulties  Cognition Arousal/Alertness: Awake/alert Behavior During Therapy: WFL for tasks assessed/performed Overall Cognitive Status: Within Functional Limits for tasks assessed                                        General Comments      Exercises Total Joint Exercises Ankle Circles/Pumps: AROM;Both;10 reps Quad Sets: 10 reps;Both;AROM Gluteal Sets: AROM;Left;5 reps   Assessment/Plan    PT Assessment Patient needs continued PT services  PT Problem List  Decreased strength;Decreased range of motion;Decreased activity tolerance;Pain;Decreased mobility;Decreased knowledge of use of DME       PT Treatment Interventions DME instruction;Gait training;Stair training;Functional mobility training;Therapeutic activities;Patient/family education;Therapeutic exercise    PT Goals (Current goals can be found in the Care Plan section)  Acute Rehab PT Goals PT Goal Formulation: With patient Time For Goal Achievement: 01/03/19 Potential to Achieve Goals: Good    Frequency 7X/week   Barriers to discharge        Co-evaluation               AM-PAC PT "6 Clicks"  Mobility  Outcome Measure Help needed turning from your back to your side while in a flat bed without using bedrails?: A Little Help needed moving from lying on your back to sitting on the side of a flat bed without using bedrails?: A Little Help needed moving to and from a bed to a chair (including a wheelchair)?: A Little Help needed standing up from a chair using your arms (e.g., wheelchair or bedside chair)?: A Little Help needed to walk in hospital room?: A Little Help needed climbing 3-5 steps with a railing? : A Little 6 Click Score: 18    End of Session Equipment Utilized During Treatment: Gait belt Activity Tolerance: Patient tolerated treatment well Patient left: in chair;with call bell/phone within reach;with chair alarm set;with family/visitor present   PT Visit Diagnosis: Difficulty in walking, not elsewhere classified (R26.2)    Time: [id number] PT Time Calculation (min) (ACUTE ONLY): 21 min   Charges:   PT Evaluation $PT Eval Low Complexity: 1 Low          Kenyon Ana, PT  Pager: [phone] Acute Rehab Dept [facility]): [phone]   12/27/2018   [facility] 12/27/2018, 3:08 PM

## 2018-12-27 NOTE — Anesthesia Procedure Notes (Signed)
Anesthesia Regional Block: Adductor canal block   Pre-Anesthetic Checklist: ,, timeout performed, Correct Patient, Correct Site, Correct Laterality, Correct Procedure, Correct Position, site marked, Risks and benefits discussed,  Surgical consent,  Pre-op evaluation,  At surgeon's request and post-op pain management  Laterality: Left  Prep: chloraprep       Needles:  Injection technique: Single-shot  Needle Type: Echogenic Stimulator Needle     Needle Length: 5cm  Needle Gauge: 22     Additional Needles:   Procedures:, nerve stimulator,,, ultrasound used (permanent image in chart),,,,  Narrative:  Start time: 12/27/2018 7:00 AM End time: 12/27/2018 7:10 AM Injection made incrementally with aspirations every 5 mL.  Performed by: Personally  Anesthesiologist: Janeece Riggers, MD  Additional Notes: Functioning IV was confirmed and monitors were applied.  A 58mm 22ga Arrow echogenic stimulator needle was used. Sterile prep and drape,hand hygiene and sterile gloves were used. Ultrasound guidance: relevant anatomy identified, needle position confirmed, local anesthetic spread visualized around nerve(s)., vascular puncture avoided.  Image printed for medical record. Negative aspiration and negative test dose prior to incremental administration of local anesthetic. The patient tolerated the procedure well.

## 2018-12-27 NOTE — Anesthesia Procedure Notes (Signed)
Date/Time: 12/27/2018 7:15 AM Performed by: Cynda Familia, CRNA Pre-anesthesia Checklist: Patient identified, Emergency Drugs available, Suction available, Patient being monitored and Timeout performed Patient Re-evaluated:Patient Re-evaluated prior to induction Oxygen Delivery Method: Simple face mask Placement Confirmation: positive ETCO2 and breath sounds checked- equal and bilateral Dental Injury: Teeth and Oropharynx as per pre-operative assessment

## 2018-12-27 NOTE — Op Note (Signed)
MRN:     [id number] DOB/AGE:    1951-01-26 / 68 y.o.       OPERATIVE REPORT   DATE OF PROCEDURE:  12/27/2018      PREOPERATIVE DIAGNOSIS:   Primary Localized Osteoarthritis left Knee       Estimated body mass index is 24.21 kg/m as calculated from the following:   Height as of this encounter: 5\' 7"  (1.702 m).   Weight as of this encounter: 70.1 kg.                                                       POSTOPERATIVE DIAGNOSIS:   Same                                                                 PROCEDURE:  Procedure(s): TOTAL KNEE ARTHROPLASTY Using Depuy Attune RP implants #7 Femur, #7Tibia, 36mm  RP bearing, 35 Patella    SURGEON: Latera Mclin A. Noemi Chapel, MD   ASSISTANT: Matthew Saras, PA-C, present and scrubbed throughout the case, critical for retraction, instrumentation, and closure.  ANESTHESIA: Spinal with Adductor Nerve Block  TOURNIQUET TIME: 58 minutes   COMPLICATIONS:  None       SPECIMENS: None   INDICATIONS FOR PROCEDURE: The patient has djd of the knee with valgus deformities, XR shows bone on bone arthritis. Patient has failed all conservative measures including anti-inflammatory medicines, narcotics, attempts at exercise and weight loss, cortisone injections and viscosupplementation.  Risks and benefits of surgery have been discussed, questions answered.    DESCRIPTION OF PROCEDURE: The patient identified by armband, received right adductor canal block and IV antibiotics, in the holding area at [facility]. Patient taken to the operating room, appropriate anesthetic monitors were attached. Spinal anesthesia induced with the patient in supine position, Foley catheter was inserted. Tourniquet applied high to the operative thigh. Lateral post and foot positioner applied to the table, the lower extremity was then prepped and draped in usual sterile fashion from the ankle to the tourniquet. Time-out procedure was performed. The limb was wrapped with an Esmarch  bandage and the tourniquet inflated to 365 mmHg.   We began the operation by making a 6cm anterior midline incision. Small bleeders in the skin and the subcutaneous tissue identified and cauterized. Transverse retinaculum was incised and reflected medially and a medial parapatellar arthrotomy was accomplished. the patella was everted and theprepatellar fat pad resected. The superficial medial collateral ligament was then elevated from anterior to posterior along the proximal flare of the tibia and anterior half of the menisci resected. The knee was hyperflexed exposing bone on bone arthritis. Peripheral and notch osteophytes as well as the cruciate ligaments were then resected. We continued to work our way around posteriorly along the proximal tibia, and externally rotated the tibia subluxing it out from underneath the femur. A McHale retractor was placed through the notch and a lateral Hohmann retractor placed, and an external tibial guide was placed.  The tibial cutting guide was pinned into place allowing resection of 6 mm of bone medially and about 3 mm of bone laterally because of her  valgus deformity.   Satisfied with the tibial resection, we then entered the distal femur 2 mm anterior to the PCL origin with the intramedullary guide rod and applied the distal femoral cutting guide set at 16mm, with 5 degrees of valgus. This was pinned along the epicondylar axis. At this point, the distal femoral cut was accomplished without difficulty. We then sized for a 7 femoral component and pinned the guide in 3 degrees of external rotation.The chamfer cutting guide was pinned into place. The anterior, posterior, and chamfer cuts were accomplished without difficulty followed by the  RP box cutting guide and the box cut. We also removed posterior osteophytes from the posterior femoral condyles. At this time, the knee was brought into full extension. We checked our extension and flexion gaps and found them symmetric at  7.  The patella thickness measured at 33m m. We set the cutting guide at 15 and removed the posterior patella sized for 35 button and drilled the lollipop. The knee was then once again hyperflexed exposing the proximal tibia. We sized for a # 7 tibial base plate, applied the smokestack and the conical reamer followed by the the Delta fin keel punch. We then hammered into place the  RP trial femoral component, inserted a trial bearing, trial patellar button, and took the knee through range of motion from 0-130 degrees. No thumb pressure was required for patellar tracking.   At this point, all trial components were removed, a double batch of DePuy HV cement with Zinacef 1.5gms was mixed and applied to all bony metallic mating surfaces. In order, we hammered into place the tibial tray and removed excess cement, the femoral component and removed excess cement, a 7 mm  RP bearing was inserted, and the knee brought to full extension with compression. The patellar button was clamped into place, and excess cement removed. While the cement cured the wound was irrigated out with normal saline solution pulse lavage, and exparel was injected throughout the knee. Ligament stability and patellar tracking were checked and found to be excellent..   The parapatellar arthrotomy was closed with  #1 Vicryl suture. The subcutaneous tissue with 0 and 2-0 undyed Vicryl suture, and 4-0 Monocryl.. A dressing of Aquaseal, 4 x 4, dressing sponges, Webril, and Ace wrap applied. Needle and sponge count were correct times 2.The patient awakened, extubated, and taken to recovery room without difficulty. Vascular status was normal, pulses 2+ and symmetric.    Lorn Junes 04/27/2017, 8:56 AM

## 2018-12-27 NOTE — Transfer of Care (Signed)
Immediate Anesthesia Transfer of Care Note  Patient: Suzanne [name]  Procedure(s) Performed: TOTAL KNEE ARTHROPLASTY (Left Knee)  Patient Location: PACU  Anesthesia Type:Spinal  Level of Consciousness: awake and alert   Airway & Oxygen Therapy: Patient Spontanous Breathing and Patient connected to face mask oxygen  Post-op Assessment: Report given to RN and Post -op Vital signs reviewed and stable  Post vital signs: Reviewed and stable  Last Vitals:  Vitals Value Taken Time  BP 127/72 12/27/18 0932  Temp    Pulse 70 12/27/18 0935  Resp 15 12/27/18 0935  SpO2 100 % 12/27/18 0935  Vitals shown include unvalidated device data.  Last Pain: There were no vitals filed for this visit.       Complications: No apparent anesthesia complications

## 2018-12-27 NOTE — Anesthesia Procedure Notes (Signed)
Procedure Name: MAC Date/Time: 12/27/2018 6:55 AM Performed by: Cynda Familia, CRNA Pre-anesthesia Checklist: Patient identified, Emergency Drugs available, Suction available, Patient being monitored and Timeout performed Patient Re-evaluated:Patient Re-evaluated prior to induction Oxygen Delivery Method: Nasal cannula Placement Confirmation: positive ETCO2 and breath sounds checked- equal and bilateral Dental Injury: Teeth and Oropharynx as per pre-operative assessment  Comments: Versed and prop for sedation no narcotics as per [place]

## 2018-12-27 NOTE — Anesthesia Postprocedure Evaluation (Signed)
Anesthesia Post Note  Patient: Srinidhi Landers Sedler  Procedure(s) Performed: TOTAL KNEE ARTHROPLASTY (Left Knee)     Patient location during evaluation: PACU Anesthesia Type: MAC Level of consciousness: awake and alert Pain management: pain level controlled Vital Signs Assessment: post-procedure vital signs reviewed and stable Respiratory status: spontaneous breathing, nonlabored ventilation, respiratory function stable and patient connected to nasal cannula oxygen Cardiovascular status: stable and blood pressure returned to baseline Postop Assessment: no apparent nausea or vomiting Anesthetic complications: no    Last Vitals:  Vitals:   12/27/18 1115 12/27/18 1127  BP: 130/76 131/65  Pulse: 70 69  Resp: 16 14  Temp: 36.8 C   SpO2: 100% 99%    Last Pain:  Vitals:   12/27/18 1100  PainSc: Asleep                 Jawanda Passey

## 2018-12-28 ENCOUNTER — Encounter (HOSPITAL_COMMUNITY): Payer: Self-pay | Admitting: Orthopedic Surgery

## 2018-12-28 DIAGNOSIS — M1712 Unilateral primary osteoarthritis, left knee: Secondary | ICD-10-CM | POA: Diagnosis not present

## 2018-12-28 LAB — CBC
HCT: 33.2 % — ABNORMAL LOW (ref 36.0–46.0)
Hemoglobin: 10.4 g/dL — ABNORMAL LOW (ref 12.0–15.0)
MCH: 32 pg (ref 26.0–34.0)
MCHC: 31.3 g/dL (ref 30.0–36.0)
MCV: 102.2 fL — ABNORMAL HIGH (ref 80.0–100.0)
Platelets: 145 10*3/uL — ABNORMAL LOW (ref 150–400)
RBC: 3.25 MIL/uL — ABNORMAL LOW (ref 3.87–5.11)
RDW: 12.3 % (ref 11.5–15.5)
WBC: 15.4 10*3/uL — ABNORMAL HIGH (ref 4.0–10.5)
nRBC: 0 % (ref 0.0–0.2)

## 2018-12-28 LAB — BASIC METABOLIC PANEL
Anion gap: 7 (ref 5–15)
BUN: 12 mg/dL (ref 8–23)
CO2: 23 mmol/L (ref 22–32)
Calcium: 8.6 mg/dL — ABNORMAL LOW (ref 8.9–10.3)
Chloride: 108 mmol/L (ref 98–111)
Creatinine, Ser: 0.63 mg/dL (ref 0.44–1.00)
GFR calc Af Amer: >60 mL/min (ref >60)
GFR calc non Af Amer: >60 mL/min (ref >60)
Glucose, Bld: 147 mg/dL — ABNORMAL HIGH (ref 70–99)
Potassium: 4.6 mmol/L (ref 3.5–5.1)
Sodium: 138 mmol/L (ref 135–145)

## 2018-12-28 MED ORDER — SODIUM CHLORIDE 0.9 % IV BOLUS
500.0000 mL | Freq: Once | INTRAVENOUS | Status: AC
  Administered 2018-12-28: 500 mL via INTRAVENOUS

## 2018-12-28 MED ORDER — OXYCODONE HCL 5 MG PO TABS: ORAL_TABLET | ORAL | 0 refills | Status: DC

## 2018-12-28 MED ORDER — POLYETHYLENE GLYCOL 3350 17 G PO PACK: PACK | ORAL | 0 refills | Status: DC

## 2018-12-28 MED ORDER — APIXABAN 2.5 MG PO TABS: 2.5000 mg | ORAL_TABLET | Freq: Two times a day (BID) | ORAL | 0 refills | Status: DC

## 2018-12-28 MED ORDER — GABAPENTIN 300 MG PO CAPS: ORAL_CAPSULE | ORAL | 0 refills | Status: DC

## 2018-12-28 MED ORDER — DOCUSATE SODIUM 100 MG PO CAPS: ORAL_CAPSULE | ORAL | 0 refills | Status: DC

## 2018-12-28 MED FILL — ELIQUIS 2.5 MG TABLET: 2.5 | 15 days supply | Qty: 30 | Fill #0

## 2018-12-28 MED FILL — GABAPENTIN 300 MG CAPSULE: 300 | 30 days supply | Qty: 30 | Fill #0

## 2018-12-28 MED FILL — oxyCODONE HCL 5 MG TABS: 5 | 7 days supply | Qty: 42 | Fill #0

## 2018-12-28 NOTE — Discharge Instructions (Signed)

## 2018-12-28 NOTE — Discharge Summary (Signed)
Patient ID: Suzanne [name] MRN: [id number] DOB/AGE: [birth year] 68 y.o.  Admit date: 12/27/2018 Discharge date: 12/28/2018  Admission Diagnoses:  Principal Problem:   Primary localized osteoarthritis of left knee Active Problems:   Depression, major, single episode, complete remission (HCC)   COPD (chronic obstructive pulmonary disease) (HCC)   Intractable chronic migraine without aura   Paroxysmal atrial fibrillation [facility]) s/p ablation november 2018, 2011 previously   Low vitamin D level   Anxiety state   Discharge Diagnoses:  Same  Past Medical History:  Diagnosis Date  . AICD (automatic cardioverter/defibrillator) present    loop recorder  . Anxiety   . Anxiety state 12/14/2018  . Asthma   . Atrial fibrillation ([place])    s/p ablations  . Atrial flutter ([place])   . COPD (chronic obstructive pulmonary disease) ([place])   . Degenerative arthritis    "maybe in my left knee" (12/16/2016)  . Depression   . Dysrhythmia 2010   A-fib  . Frozen shoulder    hx bilaterally; "had to go thru PT; no problem w/them since I learned exercises" (12/16/2016)  . GERD (gastroesophageal reflux disease)   . Glaucoma   . H/O: rheumatic fever 1957  . High cholesterol   . Migraine    "getting botox now; now only 2-3/month" (12/16/2016)  . PONV (postoperative nausea and vomiting)    with Fentanyl,  Dilaudid &  Propofol (12/16/2016)    Surgeries: Procedure(s): TOTAL KNEE ARTHROPLASTY on 12/27/2018   Consultants:   Discharged Condition: Improved  Hospital Course: Suzanne [name] is an 68 y.o. female who was admitted 12/27/2018 for operative treatment ofPrimary localized osteoarthritis of left knee. Patient has severe unremitting pain that affects sleep, daily activities, and work/hobbies. After pre-op clearance the patient was taken to the operating room on 12/27/2018 and underwent  Procedure(s): TOTAL KNEE ARTHROPLASTY.    Patient was given perioperative antibiotics:  Anti-infectives  (From admission, onward)   Start     Dose/Rate Route Frequency Ordered Stop   12/27/18 1330  ceFAZolin (ANCEF) IVPB 2g/100 mL premix     2 g 200 mL/hr over 30 Minutes Intravenous Every 6 hours 12/27/18 1228 12/27/18 2100   12/27/18 0807  cefUROXime (ZINACEF) injection  Status:  Discontinued       As needed 12/27/18 0807 12/27/18 1136   12/27/18 0600  ceFAZolin (ANCEF) IVPB 2g/100 mL premix     2 g 200 mL/hr over 30 Minutes Intravenous On call to O.R. 12/27/18 0546 12/27/18 0723   12/27/18 0551  ceFAZolin (ANCEF) 2-4 GM/100ML-% IVPB    Note to Pharmacy: Suzanne [name]   : cabinet override      12/27/18 0551 12/27/18 0723       Patient was given sequential compression devices, early ambulation, and chemoprophylaxis to prevent DVT.  Patient benefited maximally from hospital stay and there were no complications.    Recent vital signs:  Patient Vitals for the past 24 hrs:  BP Temp Temp src Pulse Resp SpO2 Height Weight  12/28/18 0610 (!) 112/55 98 F (36.7 C) Oral 70 18 95 % - -  12/28/18 0118 (!) 108/59 97.8 F (36.6 C) Oral 66 - 98 % - -  12/27/18 2220 (!) 115/49 98.2 F (36.8 C) Oral 66 18 96 % - -  12/27/18 1447 (!) 107/40 97.7 F (36.5 C) Oral 82 - 95 % - -  12/27/18 1334 (!) 122/59 98 F (36.7 C) Oral 68 16 98 % - -  12/27/18 1234 (!) 132/57 98.5 F (  36.9 C) - 68 17 99 % - -  12/27/18 1127 131/65 98.3 F (36.8 C) Oral 69 14 99 % - -  12/27/18 1127 - - - - - - 5\' 7"  (1.702 m) 69 kg  12/27/18 1115 130/76 98.2 F (36.8 C) - 70 16 100 % - -  12/27/18 1100 138/62 - - 70 10 100 % - -  12/27/18 1045 129/60 - - 74 12 98 % - -  12/27/18 1030 130/81 97.6 F (36.4 C) - 74 13 99 % - -  12/27/18 1015 135/64 - - 75 12 100 % - -  12/27/18 1000 120/83 - - 81 18 99 % - -  12/27/18 0945 102/69 - - 71 13 99 % - -  12/27/18 0933 127/72 (!) 97.5 F (36.4 C) - 68 17 100 % - -     Recent laboratory studies:  Recent Labs    12/28/18 0320  WBC 15.4*  HGB 10.4*  HCT 33.2*  PLT 145*   NA 138  K 4.6  CL 108  CO2 23  BUN 12  CREATININE 0.63  GLUCOSE 147*  CALCIUM 8.6*     Discharge Medications:   Allergies as of 12/28/2018      Reactions   Dilaudid [hydromorphone Hcl] Nausea And Vomiting   Fentanyl Nausea And Vomiting   Ciprofloxacin Rash   Erythromycin Rash   Percocet [oxycodone-acetaminophen] Nausea And Vomiting      Medication List    STOP taking these medications   butalbital-acetaminophen-caffeine 50-325-40 MG tablet Commonly known as: FIORICET     TAKE these medications   acetaminophen 325 MG tablet Commonly known as: TYLENOL Take 325 mg 2 (two) times daily as needed by mouth (pain).   albuterol 108 (90 Base) MCG/ACT inhaler Commonly known as: VENTOLIN HFA Inhale 2 puffs into the lungs every 6 (six) hours as needed for wheezing or shortness of breath.   ALIGN PO Take 4 mg by mouth daily.   ALPRAZolam 0.5 MG tablet Commonly known as: XANAX Take 0.5 mg at bedtime as needed by mouth for anxiety or sleep. Anxiety or sleep   apixaban 2.5 MG Tabs tablet Commonly known as: ELIQUIS Take 1 tablet (2.5 mg total) by mouth every 12 (twelve) hours. What changed:   medication strength  how much to take  when to take this   ARTIFICIAL TEARS OP Place 1 drop into both eyes daily as needed (dry eyes).   Botox 200 units Solr Generic drug: Botulinum Toxin Type A INJECT 200 UNITS  INTRAMUSCULARLY EVERY 3  MONTHS (GIVEN AT MD OFFICE, DISCARD UNUSED PORTION  AFTER FIRST USE)   buPROPion 300 MG 24 hr tablet Commonly known as: WELLBUTRIN XL Take 300 mg daily by mouth.   diclofenac sodium 1 % Gel Commonly known as: VOLTAREN Apply 1 application topically 3 (three) times daily as needed (pain).   docusate sodium 100 MG capsule Commonly known as: COLACE 1 tab 2 times a day while on narcotics.  STOOL SOFTENER   dorzolamide-timolol 22.3-6.8 MG/ML ophthalmic solution Commonly known as: COSOPT Place 1 drop 2 (two) times daily into both eyes.    gabapentin 300 MG capsule Commonly known as: NEURONTIN 1 po qhs for nerve pain   GAS-X PO Take 2 tablets daily as needed by mouth (gas).   oxyCODONE 5 MG immediate release tablet Commonly known as: Oxy IR/ROXICODONE 1 po q 4 hrs prn pain.  Patient had a total knee replacement on 12/28/2018   polyethylene glycol 17  g packet Commonly known as: MIRALAX / GLYCOLAX 17grams in 6 oz of something to drink twice a day until bowel movement.  LAXITIVE.  Restart if two days since last bowel movement   promethazine 25 MG tablet Commonly known as: PHENERGAN Take 1 tablet (25 mg total) by mouth every 6 (six) hours as needed for nausea or vomiting.   rosuvastatin 10 MG tablet Commonly known as: Crestor Take 1 tablet (10 mg total) by mouth once a week.   sertraline 100 MG tablet Commonly known as: ZOLOFT Take 100 mg by mouth daily with breakfast.   sodium chloride 0.65 % Soln nasal spray Commonly known as: OCEAN Place 1 spray as needed into both nostrils for congestion.   SUMAtriptan 100 MG tablet Commonly known as: IMITREX TAKE 1 TABLET BY MOUTH AT THE ONSET OF A HEADACHE DAILY AS NEEDED What changed: See the new instructions.   Vitamin D (Ergocalciferol) 1.25 MG (50000 UT) Caps capsule Commonly known as: DRISDOL Take 1 capsule (50,000 Units total) by mouth every 7 (seven) days.            Discharge Care Instructions  (From admission, onward)         Start     Ordered   12/28/18 0000  Change dressing    Comments: DO NOT REMOVE BANDAGE OVER SURGICAL INCISION.  [place] WHOLE LEG INCLUDING OVER THE WATERPROOF BANDAGE WITH SOAP AND WATER EVERY DAY.   12/28/18 0828          Diagnostic Studies: No results found.  Disposition: Discharge disposition: 01-Home or Self Care       Discharge Instructions    CPM   Complete by: As directed    Continuous passive motion machine (CPM):      Use the CPM from 0 to 90 for 6 hours per day.       You may break it up into 2 or 3  sessions per day.      Use CPM for 2 weeks or until you are told to stop.   Call MD / Call 911   Complete by: As directed    If you experience chest pain or shortness of breath, CALL 911 and be transported to the hospital emergency room.  If you develope a fever above 101 F, pus (white drainage) or increased drainage or redness at the wound, or calf pain, call your surgeon's office.   Change dressing   Complete by: As directed    DO NOT REMOVE BANDAGE OVER SURGICAL INCISION.  [place] WHOLE LEG INCLUDING OVER THE WATERPROOF BANDAGE WITH SOAP AND WATER EVERY DAY.   Constipation Prevention   Complete by: As directed    Drink plenty of fluids.  Prune juice may be helpful.  You may use a stool softener, such as Colace (over the counter) 100 mg twice a day.  Use MiraLax (over the counter) for constipation as needed.   Diet - low sodium heart healthy   Complete by: As directed    Discharge instructions   Complete by: As directed    [place]   1/2 the normal dosage for the first two week to prevent excess bleeding.  Start regular 5 mg dose two weeks post op  GET UP AND WALK EVERY HOUR SOMEWHERE.    CPM 0-90 2 HRS IN THE MORNING 2 HRS IN THE AFTERNOON AND 2 HRS BEFORE.  STAND UP AND SIT DOWN 3 TIMES IN A CHAIR WITH ARMS AND YOU  FEET ON THE FLOOR TO LOOSEN UP KNEE PRIOR TO CPM  DO NOT KICK LEG OUT WHEN SITTING DOWN OR STANDING UP   ALWAYS KEEP WEIGHT ON THE OPERATIVE LEG.     Remove items at home which could result in a fall. This includes throw rugs or furniture in walking pathways ICE to the affected joint every three hours while awake for 30 minutes at a time, for at least the first 3-5 days, and then as needed for pain and swelling.  Continue to use ice for pain and swelling. You may notice swelling that will progress down to the foot and ankle.  This is normal after surgery.  Elevate your leg when you are not up walking on it.   Continue to use the breathing  machine you got in the hospital (incentive spirometer) which will help keep your temperature down.  It is common for your temperature to cycle up and down following surgery, especially at night when you are not up moving around and exerting yourself.  The breathing machine keeps your lungs expanded and your temperature down.   DIET:  As you were doing prior to hospitalization, we recommend a well-balanced diet.  DRESSING / WOUND CARE / SHOWERING  Keep the surgical dressing until follow up.  The dressing is water proof, so you can shower without any extra covering.  IF THE DRESSING FALLS OFF or the wound gets wet inside, change the dressing with sterile gauze.  Please use good hand washing techniques before changing the dressing.  Do not use any lotions or creams on the incision until instructed by your surgeon.    ACTIVITY  Increase activity slowly as tolerated, but follow the weight bearing instructions below.   No driving for 6 weeks or until further direction given by your physician.  You cannot drive while taking narcotics.  No lifting or carrying greater than 10 lbs. until further directed by your surgeon. Avoid periods of inactivity such as sitting longer than an hour when not asleep. This helps prevent blood clots.  You may return to work once you are authorized by your doctor.     WEIGHT BEARING   Weight bearing as tolerated with assist device (walker, cane, etc) as directed, use it as long as suggested by your surgeon or therapist, typically at least 2-3 weeks.   EXERCISES  Results after joint replacement surgery are often greatly improved when you follow the exercise, range of motion and muscle strengthening exercises prescribed by your doctor. Safety measures are also important to protect the joint from further injury. Any time any of these exercises cause you to have increased pain or swelling, decrease what you are doing until you are comfortable again and then slowly increase  them. If you have problems or questions, call your caregiver or physical therapist for advice.   Rehabilitation is important following a joint replacement. After just a few days of immobilization, the muscles of the leg can become weakened and shrink (atrophy).  These exercises are designed to build up the tone and strength of the thigh and leg muscles and to improve motion. Often times heat used for twenty to thirty minutes before working out will loosen up your tissues and help with improving the range of motion but do not use heat for the first two weeks following surgery (sometimes heat can increase post-operative swelling).   These exercises can be done on a training (exercise) mat, on the floor, on a table or on a bed. Use  whatever works the best and is most comfortable for you.    Use music or television while you are exercising so that the exercises are a pleasant break in your day. This will make your life better with the exercises acting as a break in your routine that you can look forward to.   Perform all exercises about fifteen times, three times per day or as directed.  You should exercise both the operative leg and the other leg as well.   Exercises include:  Quad Sets - Tighten up the muscle on the front of the thigh (Quad) and hold for 5-10 seconds.   Straight Leg Raises - With your knee straight (if you were given a brace, keep it on), lift the leg to 60 degrees, hold for 3 seconds, and slowly lower the leg.  Perform this exercise against resistance later as your leg gets stronger.  Leg Slides: Lying on your back, slowly slide your foot toward your buttocks, bending your knee up off the floor (only go as far as is comfortable). Then slowly slide your foot back down until your leg is flat on the floor again.  Angel Wings: Lying on your back spread your legs to the side as far apart as you can without causing discomfort.  Hamstring Strength:  Lying on your back, push your heel against the  floor with your leg straight by tightening up the muscles of your buttocks.  Repeat, but this time bend your knee to a comfortable angle, and push your heel against the floor.  You may put a pillow under the heel to make it more comfortable if necessary.   A rehabilitation program following joint replacement surgery can speed recovery and prevent re-injury in the future due to weakened muscles. Contact your doctor or a physical therapist for more information on knee rehabilitation.    CONSTIPATION  Constipation is defined medically as fewer than three stools per week and severe constipation as less than one stool per week.  Even if you have a regular bowel pattern at home, your normal regimen is likely to be disrupted due to multiple reasons following surgery.  Combination of anesthesia, postoperative narcotics, change in appetite and fluid intake all can affect your bowels.   YOU MUST use at least one of the following options; they are listed in order of increasing strength to get the job done.  They are all available over the counter, and you may need to use some, POSSIBLY even all of these options:    Drink plenty of fluids (prune juice may be helpful) and high fiber foods Colace 100 mg by mouth twice a day  Senokot for constipation as directed and as needed Dulcolax (bisacodyl), take with full glass of water  Miralax (polyethylene glycol) once or twice a day as needed.  If you have tried all these things and are unable to have a bowel movement in the first 3-4 days after surgery call either your surgeon or your primary doctor.    If you experience loose stools or diarrhea, hold the medications until you stool forms back up.  If your symptoms do not get better within 1 week or if they get worse, check with your doctor.  If you experience "the worst abdominal pain ever" or develop nausea or vomiting, please contact the office immediately for further recommendations for treatment.   ITCHING:   If you experience itching with your medications, try taking only a single pain pill, or even half a pain  pill at a time.  You can also use Benadryl over the counter for itching or also to help with sleep.   TED HOSE STOCKINGS:  Use stockings on both legs until for at least 2 weeks or as directed by physician office. They may be removed at night for sleeping.  MEDICATIONS:  See your medication summary on the "After Visit Summary" that nursing will review with you.  You may have some home medications which will be placed on hold until you complete the course of blood thinner medication.  It is important for you to complete the blood thinner medication as prescribed.  PRECAUTIONS:  If you experience chest pain or shortness of breath - call 911 immediately for transfer to the hospital emergency department.   If you develop a fever greater that 101 F, purulent drainage from wound, increased redness or drainage from wound, foul odor from the wound/dressing, or calf pain - CONTACT YOUR SURGEON.                                                   FOLLOW-UP APPOINTMENTS:  If you do not already have a post-op appointment, please call the office for an appointment to be seen by your surgeon.  Guidelines for how soon to be seen are listed in your "After Visit Summary", but are typically between 1-4 weeks after surgery.  OTHER INSTRUCTIONS:   Knee Replacement:  Do not place pillow under knee, focus on keeping the knee straight while resting. CPM instructions: 0-90 degrees, 2 hours in the morning, 2 hours in the afternoon, and 2 hours in the evening. Place foam block, curve side up under heel at all times except when in CPM or when walking.  DO NOT modify, tear, cut, or change the foam block in any way.  MAKE SURE YOU:  Understand these instructions.  Get help right away if you are not doing well or get worse.    Thank you for letting us be a part of your medical care team.  It is a privilege we respect  greatly.  We hope these instructions will help you stay on track for a fast and full recovery!   Do not put a pillow under the knee. Place it under the heel.   Complete by: As directed    Place gray foam block, curve side up under heel at all times except when in CPM or when walking.  DO NOT modify, tear, cut, or change in any way the gray foam block.   Increase activity slowly as tolerated   Complete by: As directed    Patient may shower   Complete by: As directed    Aquacel dressing is water proof    Wash over it and the whole leg with soap and water at the end of your shower   TED hose   Complete by: As directed    Use stockings (TED hose) for 2 weeks on both leg(s).  You may remove them at night for sleeping.      Follow-up Information    Elsie Saas, MD Follow up on 01/10/2019.   Specialty: Orthopedic Surgery Why: ARRIVE IN PHYSICAL THERAPY AT 12:45 FOR 1 PM APPOINTMENT WITH [facility] AND SEE DR Noemi Chapel AFTER THAT APPOINTMENT  Contact information: [address] [place] [place] [address] [phone]  Signed: Linda Hedges 12/28/2018, 8:30 AM

## 2018-12-28 NOTE — Progress Notes (Signed)
Physical Therapy Treatment Patient Details Name: Suzanne [name] MRN: [id number] DOB: 11-Apr-1950 Today's Date: 12/28/2018    History of Present Illness s/p L TKA    PT Comments    Pt progressing very well. Ready for d/c from PT standpoint. Reviewed full TKA HEP and stairs.   Follow Up Recommendations  Follow surgeon's recommendation for DC plan and follow-up therapies     Equipment Recommendations       Recommendations for Other Services       Precautions / Restrictions Precautions Precautions: Knee;Fall Restrictions Weight Bearing Restrictions: No Other Position/Activity Restrictions: WBAT    Mobility  Bed Mobility               General bed mobility comments: in chair  Transfers Overall transfer level: Needs assistance Equipment used: Rolling walker (2 wheeled) Transfers: Sit to/from Stand Sit to Stand: Supervision         General transfer comment: cues for hand placement and LLE position  Ambulation/Gait Ambulation/Gait assistance: Supervision;Min guard Gait Distance (Feet): 40 Feet Assistive device: Rolling walker (2 wheeled) Gait Pattern/deviations: Step-to pattern;Decreased stance time - left     General Gait Details: cues for sequence   Stairs Stairs: Yes Stairs assistance: Min guard Stair Management: One rail Right;One rail Left;Step to pattern;Forwards Number of Stairs: 3 General stair comments: cues for sequence and safe technique   Wheelchair Mobility    Modified Rankin (Stroke Patients Only)       Balance                                            Cognition Arousal/Alertness: Awake/alert Behavior During Therapy: WFL for tasks assessed/performed Overall Cognitive Status: Within Functional Limits for tasks assessed                                        Exercises Total Joint Exercises Ankle Circles/Pumps: AROM;Both;10 reps Quad Sets: 10 reps;Both;AROM Short Arc Quad: AROM;Left;10  reps Heel Slides: AROM;Left;10 reps Hip ABduction/ADduction: AROM;Left;10 reps Straight Leg Raises: AROM;Strengthening;Left;10 reps Knee Flexion: AROM;Left;Seated;10 reps Goniometric ROM: grossly 5 to 95 degrees flexion     General Comments        Pertinent Vitals/Pain Pain Assessment: 0-10 Pain Score: 4  Pain Location: L knee Pain Descriptors / Indicators: Grimacing;Sore Pain Intervention(s): Limited activity within patient's tolerance;Monitored during session;Premedicated before session;Repositioned    Home Living                      Prior Function            PT Goals (current goals can now be found in the care plan section) Acute Rehab PT Goals PT Goal Formulation: With patient Time For Goal Achievement: 01/03/19 Potential to Achieve Goals: Good Progress towards PT goals: Progressing toward goals    Frequency    7X/week      PT Plan Current plan remains appropriate    Co-evaluation              AM-PAC PT "6 Clicks" Mobility   Outcome Measure  Help needed turning from your back to your side while in a flat bed without using bedrails?: A Little Help needed moving from lying on your back to sitting on the side of a flat bed without  using bedrails?: None Help needed moving to and from a bed to a chair (including a wheelchair)?: A Little Help needed standing up from a chair using your arms (e.g., wheelchair or bedside chair)?: A Little Help needed to walk in hospital room?: A Little Help needed climbing 3-5 steps with a railing? : A Little 6 Click Score: 19    End of Session Equipment Utilized During Treatment: Gait belt Activity Tolerance: Patient tolerated treatment well Patient left: in chair;with call bell/phone within reach;with chair alarm set   PT Visit Diagnosis: Difficulty in walking, not elsewhere classified (R26.2)     Time: [id number] PT Time Calculation (min) (ACUTE ONLY): 27 min  Charges:  $Gait Training: 8-22  mins $Therapeutic Exercise: 8-22 mins                     Kenyon Ana, PT  Pager: [phone] Acute Rehab Dept [facility]): [phone]   12/28/2018    [facility] 12/28/2018, 10:03 AM

## 2019-01-09 MED FILL — ELIQUIS 5 MG TABLET: 5 | 90 days supply | Qty: 180 | Fill #1

## 2019-01-10 MED FILL — CEPHALEXIN 500 MG CAPSULE: 500 | 10 days supply | Qty: 40 | Fill #0

## 2019-01-14 MED FILL — MOXIFLOXACIN HCL 400 MG TAB: 400 | 10 days supply | Qty: 10 | Fill #0

## 2019-01-20 MED FILL — CEPHALEXIN 500 MG CAPSULE: 500 | 10 days supply | Qty: 40 | Fill #0

## 2019-01-20 NOTE — Progress Notes (Signed)
Carelink Summary Report / Loop Recorder 

## 2019-01-24 ENCOUNTER — Ambulatory Visit (INDEPENDENT_AMBULATORY_CARE_PROVIDER_SITE_OTHER): Payer: Medicare Other | Admitting: *Deleted

## 2019-01-24 DIAGNOSIS — I480 Paroxysmal atrial fibrillation: Secondary | ICD-10-CM | POA: Diagnosis not present

## 2019-01-24 LAB — CUP PACEART REMOTE DEVICE CHECK
Date Time Interrogation Session: 20201221114502
Implantable Pulse Generator Implant Date: 20180830

## 2019-02-02 MED FILL — SUMATRIPTAN SUCC 100 MG TAB: 100 | 90 days supply | Qty: 27 | Fill #1

## 2019-02-09 ENCOUNTER — Telehealth: Payer: Self-pay

## 2019-02-09 DIAGNOSIS — M1712 Unilateral primary osteoarthritis, left knee: Secondary | ICD-10-CM | POA: Diagnosis not present

## 2019-02-09 DIAGNOSIS — M25562 Pain in left knee: Secondary | ICD-10-CM | POA: Diagnosis not present

## 2019-02-09 DIAGNOSIS — M25662 Stiffness of left knee, not elsewhere classified: Secondary | ICD-10-CM | POA: Diagnosis not present

## 2019-02-09 DIAGNOSIS — Z96652 Presence of left artificial knee joint: Secondary | ICD-10-CM | POA: Diagnosis not present

## 2019-02-09 NOTE — Telephone Encounter (Signed)
New insurance scanned into files. DWD

## 2019-02-10 ENCOUNTER — Ambulatory Visit (INDEPENDENT_AMBULATORY_CARE_PROVIDER_SITE_OTHER)
Admission: RE | Admit: 2019-02-10 | Discharge: 2019-02-10 | Disposition: A | Discharge status: Home / Self Care | Payer: Medicare PPO | Source: Ambulatory Visit | Attending: Acute Care | Admitting: Acute Care

## 2019-02-10 ENCOUNTER — Other Ambulatory Visit: Payer: Self-pay

## 2019-02-10 DIAGNOSIS — Z87891 Personal history of nicotine dependence: Secondary | ICD-10-CM | POA: Diagnosis not present

## 2019-02-10 DIAGNOSIS — F17210 Nicotine dependence, cigarettes, uncomplicated: Secondary | ICD-10-CM

## 2019-02-10 DIAGNOSIS — Z122 Encounter for screening for malignant neoplasm of respiratory organs: Secondary | ICD-10-CM

## 2019-02-10 MED FILL — DORZOLAMIDE HCL-TIMOLOL MAL: 22.3-6.8 | 90 days supply | Qty: 20 | Fill #2

## 2019-02-11 DIAGNOSIS — M25662 Stiffness of left knee, not elsewhere classified: Secondary | ICD-10-CM | POA: Diagnosis not present

## 2019-02-11 DIAGNOSIS — M6281 Muscle weakness (generalized): Secondary | ICD-10-CM | POA: Diagnosis not present

## 2019-02-11 DIAGNOSIS — M1712 Unilateral primary osteoarthritis, left knee: Secondary | ICD-10-CM | POA: Diagnosis not present

## 2019-02-11 DIAGNOSIS — R269 Unspecified abnormalities of gait and mobility: Secondary | ICD-10-CM | POA: Diagnosis not present

## 2019-02-14 DIAGNOSIS — R269 Unspecified abnormalities of gait and mobility: Secondary | ICD-10-CM | POA: Diagnosis not present

## 2019-02-14 DIAGNOSIS — M6281 Muscle weakness (generalized): Secondary | ICD-10-CM | POA: Diagnosis not present

## 2019-02-14 DIAGNOSIS — M1712 Unilateral primary osteoarthritis, left knee: Secondary | ICD-10-CM | POA: Diagnosis not present

## 2019-02-14 DIAGNOSIS — M25662 Stiffness of left knee, not elsewhere classified: Secondary | ICD-10-CM | POA: Diagnosis not present

## 2019-02-16 DIAGNOSIS — R269 Unspecified abnormalities of gait and mobility: Secondary | ICD-10-CM | POA: Diagnosis not present

## 2019-02-16 DIAGNOSIS — M1712 Unilateral primary osteoarthritis, left knee: Secondary | ICD-10-CM | POA: Diagnosis not present

## 2019-02-16 DIAGNOSIS — M25662 Stiffness of left knee, not elsewhere classified: Secondary | ICD-10-CM | POA: Diagnosis not present

## 2019-02-16 DIAGNOSIS — M25562 Pain in left knee: Secondary | ICD-10-CM | POA: Diagnosis not present

## 2019-02-17 ENCOUNTER — Other Ambulatory Visit: Payer: Self-pay | Admitting: *Deleted

## 2019-02-17 DIAGNOSIS — F17210 Nicotine dependence, cigarettes, uncomplicated: Secondary | ICD-10-CM

## 2019-02-17 DIAGNOSIS — Z87891 Personal history of nicotine dependence: Secondary | ICD-10-CM

## 2019-02-18 DIAGNOSIS — M6281 Muscle weakness (generalized): Secondary | ICD-10-CM | POA: Diagnosis not present

## 2019-02-18 DIAGNOSIS — R269 Unspecified abnormalities of gait and mobility: Secondary | ICD-10-CM | POA: Diagnosis not present

## 2019-02-18 DIAGNOSIS — M1712 Unilateral primary osteoarthritis, left knee: Secondary | ICD-10-CM | POA: Diagnosis not present

## 2019-02-18 DIAGNOSIS — M25662 Stiffness of left knee, not elsewhere classified: Secondary | ICD-10-CM | POA: Diagnosis not present

## 2019-02-21 DIAGNOSIS — Z96652 Presence of left artificial knee joint: Secondary | ICD-10-CM | POA: Diagnosis not present

## 2019-02-21 DIAGNOSIS — M1712 Unilateral primary osteoarthritis, left knee: Secondary | ICD-10-CM | POA: Diagnosis not present

## 2019-02-21 DIAGNOSIS — M6281 Muscle weakness (generalized): Secondary | ICD-10-CM | POA: Diagnosis not present

## 2019-02-21 DIAGNOSIS — M25662 Stiffness of left knee, not elsewhere classified: Secondary | ICD-10-CM | POA: Diagnosis not present

## 2019-02-23 DIAGNOSIS — M1712 Unilateral primary osteoarthritis, left knee: Secondary | ICD-10-CM | POA: Diagnosis not present

## 2019-02-23 DIAGNOSIS — Z96652 Presence of left artificial knee joint: Secondary | ICD-10-CM | POA: Diagnosis not present

## 2019-02-23 DIAGNOSIS — M6281 Muscle weakness (generalized): Secondary | ICD-10-CM | POA: Diagnosis not present

## 2019-02-23 DIAGNOSIS — M25662 Stiffness of left knee, not elsewhere classified: Secondary | ICD-10-CM | POA: Diagnosis not present

## 2019-02-24 ENCOUNTER — Ambulatory Visit (INDEPENDENT_AMBULATORY_CARE_PROVIDER_SITE_OTHER): Payer: Medicare PPO | Admitting: Neurology

## 2019-02-24 ENCOUNTER — Ambulatory Visit: Payer: Medicare Other | Admitting: Neurology

## 2019-02-24 ENCOUNTER — Encounter: Payer: Self-pay | Admitting: Neurology

## 2019-02-24 ENCOUNTER — Other Ambulatory Visit: Payer: Self-pay

## 2019-02-24 VITALS — BP 96/56 | HR 76 | Temp 96.0°F

## 2019-02-24 DIAGNOSIS — G43719 Chronic migraine without aura, intractable, without status migrainosus: Secondary | ICD-10-CM

## 2019-02-24 MED ORDER — ONABOTULINUMTOXINA 100 UNITS IJ SOLR
200.0000 [IU] | Freq: Once | INTRAMUSCULAR | Status: AC
  Administered 2019-02-24: 14:00:00 200 [IU] via INTRAMUSCULAR

## 2019-02-24 MED ORDER — BUTALBITAL-APAP-CAFFEINE 50-325-40 MG PO TABS: 1.0000 | ORAL_TABLET | Freq: Four times a day (QID) | ORAL | 2 refills | Status: DC | PRN

## 2019-02-24 MED FILL — SERTRALINE HCL 100 MG TAB: 100 | 90 days supply | Qty: 90 | Fill #1

## 2019-02-24 MED FILL — BUPROPION HCL XL 300 MG TAB: 300 | 90 days supply | Qty: 90 | Fill #1

## 2019-02-24 MED FILL — BUTALBITAL-APAP-CAFFEINE 50: 50-325-40 | 7 days supply | Qty: 30 | Fill #0

## 2019-02-24 NOTE — Progress Notes (Signed)
Please refer to Botox procedure note. 

## 2019-02-24 NOTE — Procedures (Signed)
     BOTOX PROCEDURE NOTE FOR MIGRAINE HEADACHE   HISTORY: Suzanne [name] is a 69 year old patient with a history of intractable migraine headaches.  She has gotten benefit from Botox and she returns for another Botox therapy.  She just recently had a left total knee replacement and she is recovering from this.   Description of procedure:  The patient was placed in a sitting position. The standard protocol was used for Botox as follows, with 5 units of Botox injected at each site:   -Procerus muscle, midline injection  -Corrugator muscle, bilateral injection  -Frontalis muscle, bilateral injection, with 2 sites each side, medial injection was performed in the upper one third of the frontalis muscle, in the region vertical from the medial inferior edge of the superior orbital rim. The lateral injection was again in the upper one third of the forehead vertically above the lateral limbus of the cornea, 1.5 cm lateral to the medial injection site.  -Temporalis muscle injection, 4 sites, bilaterally. The first injection was 3 cm above the tragus of the ear, second injection site was 1.5 cm to 3 cm up from the first injection site in line with the tragus of the ear. The third injection site was 1.5-3 cm forward between the first 2 injection sites. The fourth injection site was 1.5 cm posterior to the second injection site.  -Occipitalis muscle injection, 3 sites, bilaterally. The first injection was done one half way between the occipital protuberance and the tip of the mastoid process behind the ear. The second injection site was done lateral and superior to the first, 1 fingerbreadth from the first injection. The third injection site was 1 fingerbreadth superiorly and medially from the first injection site.  -Cervical paraspinal muscle injection, 2 sites, bilateral, the first injection site was 1 cm from the midline of the cervical spine, 3 cm inferior to the lower border of the occipital  protuberance. The second injection site was 1.5 cm superiorly and laterally to the first injection site.  -Trapezius muscle injection was performed at 3 sites, bilaterally. The first injection site was in the upper trapezius muscle halfway between the inflection point of the neck, and the acromion. The second injection site was one half way between the acromion and the first injection site. The third injection was done between the first injection site and the inflection point of the neck.   A 200 unit bottle of Botox was used, 155 units were injected, the rest of the Botox was wasted. The patient tolerated the procedure well, there were no complications of the above procedure.  Botox NDC [id number] Lot number [id number] Expiration date 10/2021

## 2019-02-25 DIAGNOSIS — M25662 Stiffness of left knee, not elsewhere classified: Secondary | ICD-10-CM | POA: Diagnosis not present

## 2019-02-25 DIAGNOSIS — M6281 Muscle weakness (generalized): Secondary | ICD-10-CM | POA: Diagnosis not present

## 2019-02-25 DIAGNOSIS — R269 Unspecified abnormalities of gait and mobility: Secondary | ICD-10-CM | POA: Diagnosis not present

## 2019-02-25 DIAGNOSIS — M1712 Unilateral primary osteoarthritis, left knee: Secondary | ICD-10-CM | POA: Diagnosis not present

## 2019-02-27 ENCOUNTER — Ambulatory Visit: Payer: Medicare PPO | Attending: Internal Medicine

## 2019-02-27 DIAGNOSIS — Z23 Encounter for immunization: Secondary | ICD-10-CM

## 2019-02-27 NOTE — Progress Notes (Signed)
   Covid-19 Vaccination Clinic  Name:  MALAN MORGANO    MRN: [id number] DOB: March 08, 1950  02/27/2019  Ms. Atwood was observed post Covid-19 immunization for 15 minutes without incidence. She was provided with Vaccine Information Sheet and instruction to access the V-Safe system.   Ms. Torelli was instructed to call 911 with any severe reactions post vaccine: Marland Kitchen Difficulty breathing  . Swelling of your face and throat  . A fast heartbeat  . A bad rash all over your body  . Dizziness and weakness    Immunizations Administered    Name Date Dose VIS Date Route   Pfizer COVID-19 Vaccine 02/27/2019  1:06 PM 0.3 mL 01/14/2019 Intramuscular   Manufacturer: [place]   Lot: [id number]   [place]: [id number]

## 2019-02-28 ENCOUNTER — Ambulatory Visit (INDEPENDENT_AMBULATORY_CARE_PROVIDER_SITE_OTHER): Payer: Medicare PPO | Admitting: *Deleted

## 2019-02-28 DIAGNOSIS — M25562 Pain in left knee: Secondary | ICD-10-CM | POA: Diagnosis not present

## 2019-02-28 DIAGNOSIS — M6281 Muscle weakness (generalized): Secondary | ICD-10-CM | POA: Diagnosis not present

## 2019-02-28 DIAGNOSIS — I480 Paroxysmal atrial fibrillation: Secondary | ICD-10-CM | POA: Diagnosis not present

## 2019-02-28 DIAGNOSIS — M25662 Stiffness of left knee, not elsewhere classified: Secondary | ICD-10-CM | POA: Diagnosis not present

## 2019-02-28 DIAGNOSIS — M1712 Unilateral primary osteoarthritis, left knee: Secondary | ICD-10-CM | POA: Diagnosis not present

## 2019-02-28 LAB — CUP PACEART REMOTE DEVICE CHECK
Date Time Interrogation Session: 20210124232224
Implantable Pulse Generator Implant Date: 20180830

## 2019-03-01 ENCOUNTER — Ambulatory Visit: Payer: Medicare PPO

## 2019-03-02 DIAGNOSIS — M25662 Stiffness of left knee, not elsewhere classified: Secondary | ICD-10-CM | POA: Diagnosis not present

## 2019-03-02 DIAGNOSIS — M6281 Muscle weakness (generalized): Secondary | ICD-10-CM | POA: Diagnosis not present

## 2019-03-02 DIAGNOSIS — Z96652 Presence of left artificial knee joint: Secondary | ICD-10-CM | POA: Diagnosis not present

## 2019-03-02 DIAGNOSIS — M1712 Unilateral primary osteoarthritis, left knee: Secondary | ICD-10-CM | POA: Diagnosis not present

## 2019-03-04 DIAGNOSIS — M6281 Muscle weakness (generalized): Secondary | ICD-10-CM | POA: Diagnosis not present

## 2019-03-04 DIAGNOSIS — M1712 Unilateral primary osteoarthritis, left knee: Secondary | ICD-10-CM | POA: Diagnosis not present

## 2019-03-04 DIAGNOSIS — M25562 Pain in left knee: Secondary | ICD-10-CM | POA: Diagnosis not present

## 2019-03-04 DIAGNOSIS — M25662 Stiffness of left knee, not elsewhere classified: Secondary | ICD-10-CM | POA: Diagnosis not present

## 2019-03-07 ENCOUNTER — Telehealth: Payer: Self-pay | Admitting: Family Medicine

## 2019-03-07 DIAGNOSIS — M6281 Muscle weakness (generalized): Secondary | ICD-10-CM | POA: Diagnosis not present

## 2019-03-07 DIAGNOSIS — M25562 Pain in left knee: Secondary | ICD-10-CM | POA: Diagnosis not present

## 2019-03-07 DIAGNOSIS — M25662 Stiffness of left knee, not elsewhere classified: Secondary | ICD-10-CM | POA: Diagnosis not present

## 2019-03-07 DIAGNOSIS — M1712 Unilateral primary osteoarthritis, left knee: Secondary | ICD-10-CM | POA: Diagnosis not present

## 2019-03-07 NOTE — Telephone Encounter (Signed)
Left message for patient to call back and schedule Medicare Annual Wellness Visit (AWV) either virtually/audio only OR in office. Whatever the patients preference is.  No hx; please schedule at anytime with [facility]Nurse Health Advisor at [facility].

## 2019-03-09 DIAGNOSIS — M1712 Unilateral primary osteoarthritis, left knee: Secondary | ICD-10-CM | POA: Diagnosis not present

## 2019-03-09 DIAGNOSIS — M25662 Stiffness of left knee, not elsewhere classified: Secondary | ICD-10-CM | POA: Diagnosis not present

## 2019-03-09 DIAGNOSIS — M6281 Muscle weakness (generalized): Secondary | ICD-10-CM | POA: Diagnosis not present

## 2019-03-09 DIAGNOSIS — R269 Unspecified abnormalities of gait and mobility: Secondary | ICD-10-CM | POA: Diagnosis not present

## 2019-03-11 DIAGNOSIS — M1712 Unilateral primary osteoarthritis, left knee: Secondary | ICD-10-CM | POA: Diagnosis not present

## 2019-03-11 DIAGNOSIS — R269 Unspecified abnormalities of gait and mobility: Secondary | ICD-10-CM | POA: Diagnosis not present

## 2019-03-11 DIAGNOSIS — M25662 Stiffness of left knee, not elsewhere classified: Secondary | ICD-10-CM | POA: Diagnosis not present

## 2019-03-11 DIAGNOSIS — M6281 Muscle weakness (generalized): Secondary | ICD-10-CM | POA: Diagnosis not present

## 2019-03-14 DIAGNOSIS — M6281 Muscle weakness (generalized): Secondary | ICD-10-CM | POA: Diagnosis not present

## 2019-03-14 DIAGNOSIS — M1712 Unilateral primary osteoarthritis, left knee: Secondary | ICD-10-CM | POA: Diagnosis not present

## 2019-03-14 DIAGNOSIS — R269 Unspecified abnormalities of gait and mobility: Secondary | ICD-10-CM | POA: Diagnosis not present

## 2019-03-14 DIAGNOSIS — M25662 Stiffness of left knee, not elsewhere classified: Secondary | ICD-10-CM | POA: Diagnosis not present

## 2019-03-16 DIAGNOSIS — R269 Unspecified abnormalities of gait and mobility: Secondary | ICD-10-CM | POA: Diagnosis not present

## 2019-03-16 DIAGNOSIS — M6281 Muscle weakness (generalized): Secondary | ICD-10-CM | POA: Diagnosis not present

## 2019-03-16 DIAGNOSIS — M25662 Stiffness of left knee, not elsewhere classified: Secondary | ICD-10-CM | POA: Diagnosis not present

## 2019-03-16 DIAGNOSIS — M1712 Unilateral primary osteoarthritis, left knee: Secondary | ICD-10-CM | POA: Diagnosis not present

## 2019-03-18 ENCOUNTER — Ambulatory Visit: Payer: Medicare PPO

## 2019-03-21 ENCOUNTER — Ambulatory Visit: Payer: Medicare PPO | Attending: Internal Medicine

## 2019-03-21 DIAGNOSIS — Z23 Encounter for immunization: Secondary | ICD-10-CM | POA: Insufficient documentation

## 2019-03-21 NOTE — Progress Notes (Signed)
   Covid-19 Vaccination Clinic  Name:  Suzanne [name]    MRN: [id number] DOB: 07-01-1950  03/21/2019  Suzanne [name] was observed post Covid-19 immunization for 15 minutes without incidence. She was provided with Vaccine Information Sheet and instruction to access the V-Safe system.   Suzanne [name] was instructed to call 911 with any severe reactions post vaccine: Marland Kitchen Difficulty breathing  . Swelling of your face and throat  . A fast heartbeat  . A bad rash all over your body  . Dizziness and weakness    Immunizations Administered    Name Date Dose VIS Date Route   Pfizer COVID-19 Vaccine 03/21/2019 10:59 AM 0.3 mL 01/14/2019 Intramuscular   Manufacturer: [place]   Lot: [id number]   [place]: [id number]

## 2019-03-23 DIAGNOSIS — R269 Unspecified abnormalities of gait and mobility: Secondary | ICD-10-CM | POA: Diagnosis not present

## 2019-03-23 DIAGNOSIS — M25662 Stiffness of left knee, not elsewhere classified: Secondary | ICD-10-CM | POA: Diagnosis not present

## 2019-03-23 DIAGNOSIS — M1712 Unilateral primary osteoarthritis, left knee: Secondary | ICD-10-CM | POA: Diagnosis not present

## 2019-03-23 DIAGNOSIS — M25562 Pain in left knee: Secondary | ICD-10-CM | POA: Diagnosis not present

## 2019-03-25 ENCOUNTER — Ambulatory Visit (INDEPENDENT_AMBULATORY_CARE_PROVIDER_SITE_OTHER): Payer: Medicare PPO

## 2019-03-25 ENCOUNTER — Other Ambulatory Visit: Payer: Self-pay

## 2019-03-25 DIAGNOSIS — Z0000 Encounter for general adult medical examination without abnormal findings: Secondary | ICD-10-CM | POA: Diagnosis not present

## 2019-03-25 NOTE — Patient Instructions (Signed)
Suzanne [name] , Thank you for taking time to come for your Medicare Wellness Visit. I appreciate your ongoing commitment to your health goals. Please review the following plan we discussed and let me know if I can assist you in the future.   Screening recommendations/referrals: Colorectal Screening: up to date; last colonoscopy 05/23/13 Mammogram: up to date; last 09/28/18 Bone Density: up to date; last 09/28/18  Vision and Dental Exams: Recommended annual ophthalmology exams for early detection of glaucoma and other disorders of the eye Recommended annual dental exams for proper oral hygiene  Vaccinations: Influenza vaccine: completed 09/24/18 Pneumococcal vaccine: up to date; last 03/27/16 Tdap vaccine: up to date; last 12/31/14Shingles vaccine: Please call your insurance company to determine your out of pocket expense for the Shingrix vaccine. You may receive this vaccine at your local pharmacy.  Advanced directives: Please bring a copy of your POA (Power of Attorney) and/or Living Will to your next appointment.  Goals: Recommend to drink at least 6-8 8oz glasses of water per day and consume a balanced diet rich in fresh fruits and vegetables.   Next appointment: Please schedule your Annual Wellness Visit with your Nurse Health Advisor in one year.  Preventive Care 29 Years and Older, Female Preventive care refers to lifestyle choices and visits with your health care provider that can promote health and wellness. What does preventive care include?  A yearly physical exam. This is also called an annual well check.  Dental exams once or twice a year.  Routine eye exams. Ask your health care provider how often you should have your eyes checked.  Personal lifestyle choices, including:  Daily care of your teeth and gums.  Regular physical activity.  Eating a healthy diet.  Avoiding tobacco and drug use.  Limiting alcohol use.  Practicing safe sex.  Taking low-dose aspirin  every day if recommended by your health care provider.  Taking vitamin and mineral supplements as recommended by your health care provider. What happens during an annual well check? The services and screenings done by your health care provider during your annual well check will depend on your age, overall health, lifestyle risk factors, and family history of disease. Counseling  Your health care provider may ask you questions about your:  Alcohol use.  Tobacco use.  Drug use.  Emotional well-being.  Home and relationship well-being.  Sexual activity.  Eating habits.  History of falls.  Memory and ability to understand (cognition).  Work and work Statistician.  Reproductive health. Screening  You may have the following tests or measurements:  Height, weight, and BMI.  Blood pressure.  Lipid and cholesterol levels. These may be checked every 5 years, or more frequently if you are over 23 years old.  Skin check.  Lung cancer screening. You may have this screening every year starting at age 17 if you have a 30-pack-year history of smoking and currently smoke or have quit within the past 15 years.  Fecal occult blood test (FOBT) of the stool. You may have this test every year starting at age 9.  Flexible sigmoidoscopy or colonoscopy. You may have a sigmoidoscopy every 5 years or a colonoscopy every 10 years starting at age 32.  Hepatitis C blood test.  Hepatitis B blood test.  Sexually transmitted disease (STD) testing.  Diabetes screening. This is done by checking your blood sugar (glucose) after you have not eaten for a while (fasting). You may have this done every 1-3 years.  Bone density scan. This is  done to screen for osteoporosis. You may have this done starting at age 80.  Mammogram. This may be done every 1-2 years. Talk to your health care provider about how often you should have regular mammograms. Talk with your health care provider about your test  results, treatment options, and if necessary, the need for more tests. Vaccines  Your health care provider may recommend certain vaccines, such as:  Influenza vaccine. This is recommended every year.  Tetanus, diphtheria, and acellular pertussis (Tdap, Td) vaccine. You may need a Td booster every 10 years.  Zoster vaccine. You may need this after age 43.  Pneumococcal 13-valent conjugate (PCV13) vaccine. One dose is recommended after age 45.  Pneumococcal polysaccharide (PPSV23) vaccine. One dose is recommended after age 15. Talk to your health care provider about which screenings and vaccines you need and how often you need them. This information is not intended to replace advice given to you by your health care provider. Make sure you discuss any questions you have with your health care provider. Document Released: 02/16/2015 Document Revised: 10/10/2015 Document Reviewed: 11/21/2014 Elsevier Interactive Patient Education  2017 [employer] Prevention in the Home Falls can cause injuries. They can happen to people of all ages. There are many things you can do to make your home safe and to help prevent falls. What can I do on the outside of my home?  Regularly fix the edges of walkways and driveways and fix any cracks.  Remove anything that might make you trip as you walk through a door, such as a raised step or threshold.  Trim any bushes or trees on the path to your home.  Use bright outdoor lighting.  Clear any walking paths of anything that might make someone trip, such as rocks or tools.  Regularly check to see if handrails are loose or broken. Make sure that both sides of any steps have handrails.  Any raised decks and porches should have guardrails on the edges.  Have any leaves, snow, or ice cleared regularly.  Use sand or salt on walking paths during winter.  Clean up any spills in your garage right away. This includes oil or grease spills. What can I do in  the bathroom?  Use night lights.  Install grab bars by the toilet and in the tub and shower. Do not use towel bars as grab bars.  Use non-skid mats or decals in the tub or shower.  If you need to sit down in the shower, use a plastic, non-slip stool.  Keep the floor dry. Clean up any water that spills on the floor as soon as it happens.  Remove soap buildup in the tub or shower regularly.  Attach bath mats securely with double-sided non-slip rug tape.  Do not have throw rugs and other things on the floor that can make you trip. What can I do in the bedroom?  Use night lights.  Make sure that you have a light by your bed that is easy to reach.  Do not use any sheets or blankets that are too big for your bed. They should not hang down onto the floor.  Have a firm chair that has side arms. You can use this for support while you get dressed.  Do not have throw rugs and other things on the floor that can make you trip. What can I do in the kitchen?  Clean up any spills right away.  Avoid walking on wet floors.  Keep  items that you use a lot in easy-to-reach places.  If you need to reach something above you, use a strong step stool that has a grab bar.  Keep electrical cords out of the way.  Do not use floor polish or wax that makes floors slippery. If you must use wax, use non-skid floor wax.  Do not have throw rugs and other things on the floor that can make you trip. What can I do with my stairs?  Do not leave any items on the stairs.  Make sure that there are handrails on both sides of the stairs and use them. Fix handrails that are broken or loose. Make sure that handrails are as long as the stairways.  Check any carpeting to make sure that it is firmly attached to the stairs. Fix any carpet that is loose or worn.  Avoid having throw rugs at the top or bottom of the stairs. If you do have throw rugs, attach them to the floor with carpet tape.  Make sure that you  have a light switch at the top of the stairs and the bottom of the stairs. If you do not have them, ask someone to add them for you. What else can I do to help prevent falls?  Wear shoes that:  Do not have high heels.  Have rubber bottoms.  Are comfortable and fit you well.  Are closed at the toe. Do not wear sandals.  If you use a stepladder:  Make sure that it is fully opened. Do not climb a closed stepladder.  Make sure that both sides of the stepladder are locked into place.  Ask someone to hold it for you, if possible.  Clearly mark and make sure that you can see:  Any grab bars or handrails.  First and last steps.  Where the edge of each step is.  Use tools that help you move around (mobility aids) if they are needed. These include:  Canes.  Walkers.  Scooters.  Crutches.  Turn on the lights when you go into a dark area. Replace any light bulbs as soon as they burn out.  Set up your furniture so you have a clear path. Avoid moving your furniture around.  If any of your floors are uneven, fix them.  If there are any pets around you, be aware of where they are.  Review your medicines with your doctor. Some medicines can make you feel dizzy. This can increase your chance of falling. Ask your doctor what other things that you can do to help prevent falls. This information is not intended to replace advice given to you by your health care provider. Make sure you discuss any questions you have with your health care provider. Document Released: 11/16/2008 Document Revised: 06/28/2015 Document Reviewed: 02/24/2014 Elsevier Interactive Patient Education  2017 [employer].

## 2019-03-25 NOTE — Progress Notes (Signed)
This visit is being conducted via phone call due to the COVID-19 pandemic. This patient has given me verbal consent via phone to conduct this visit, patient states they are participating from their home address. Some vital signs may be absent or patient reported.   Patient identification: identified by name, DOB, and current address.  Location provider: [facility], Office Persons participating in the virtual visit: Denman George LPN, patient, and Dr. Garret Reddish    Subjective:   Suzanne [name] is a 69 y.o. female who presents for Medicare Annual (Subsequent) preventive examination.  Review of Systems:   Cardiac Risk Factors include: advanced age (>25men, >54 women);smoking/ tobacco exposure    Objective:     Vitals: There were no vitals taken for this visit.  There is no height or weight on file to calculate BMI.  Advanced Directives 03/25/2019 12/27/2018 12/17/2018 12/16/2016 12/16/2016 10/13/2016 10/02/2016  Does Patient Have a Medical Advance Directive? Yes Yes Yes Yes Yes Yes Yes  Type of Advance Directive Living will;Healthcare Power of [place];Living will [place];Living will [place];Living will [place];Living will [place];Living will [place];Living will  Does patient want to make changes to medical advance directive? No - Patient declined No - Patient declined - No - Patient declined No - Patient declined - -  Copy of [place] in Chart? No - copy requested No - copy requested - No - copy requested - - -    Tobacco Social History   Tobacco Use  Smoking Status Current Every Day Smoker  . Packs/day: 0.75  . Years: 50.00  . Pack years: 37.50  . Types: Cigarettes  Smokeless Tobacco Never Used  Tobacco Comment   Discussed reduce to quit, gave card with resources to help in the community     Ready to quit: Not Answered  Counseling given: Not Answered Comment: Discussed reduce to quit, gave card with resources to help in the community   Clinical Intake:  Pre-visit preparation completed: Yes  Pain : No/denies pain  Diabetes: No  How often do you need to have someone help you when you read instructions, pamphlets, or other written materials from your doctor or pharmacy?: 1 - Never  Interpreter Needed?: No  Information entered by :: Denman George LPN  Past Medical History:  Diagnosis Date  . AICD (automatic cardioverter/defibrillator) present    loop recorder  . Anxiety   . Anxiety state 12/14/2018  . Asthma   . Atrial fibrillation ([place])    s/p ablations  . Atrial flutter ([place])   . COPD (chronic obstructive pulmonary disease) ([place])   . Degenerative arthritis    "maybe in my left knee" (12/16/2016)  . Depression   . Dysrhythmia 2010   A-fib  . Frozen shoulder    hx bilaterally; "had to go thru PT; no problem w/them since I learned exercises" (12/16/2016)  . GERD (gastroesophageal reflux disease)   . Glaucoma   . H/O: rheumatic fever 1957  . High cholesterol   . Migraine    "getting botox now; now only 2-3/month" (12/16/2016)  . PONV (postoperative nausea and vomiting)    with Fentanyl,  Dilaudid &  Propofol (12/16/2016)   Past Surgical History:  Procedure Laterality Date  . ABDOMINAL HYSTERECTOMY  1990   right ovary removed  . APPENDECTOMY  1990  . ATRIAL FIBRILLATION ABLATION N/A 12/16/2016   Procedure: ATRIAL FIBRILLATION ABLATION;  Surgeon: Thompson Grayer, MD;  Location: [facility];  Service: Cardiovascular;  Laterality: N/A;  . CATARACT EXTRACTION, BILATERAL    . CTI ablation  3/11  . ESOPHAGOGASTRODUODENOSCOPY (EGD) WITH PROPOFOL N/A 01/06/2013   Procedure: ESOPHAGOGASTRODUODENOSCOPY (EGD) WITH PROPOFOL;  Surgeon: Cleotis Nipper, MD;  Location: [facility];  Service: Endoscopy;  Laterality: N/A;  . KNEE ARTHROSCOPY Bilateral 1999-2006  . KNEE SURGERY Bilateral  [id number]   "open; for congenital deformity"  . LOOP RECORDER INSERTION N/A 10/02/2016   Procedure: LOOP RECORDER INSERTION;  Surgeon: Thompson Grayer, MD;  Location: [facility];  Service: Cardiovascular;  Laterality: N/A;  . PERIPHERAL VASCULAR INTERVENTION  2/11  & 6/11  . RHINOPLASTY  1965  . TONSILLECTOMY  as child  . TOOTH EXTRACTION  2009   2 teeth extractted  . TOTAL KNEE ARTHROPLASTY Left 12/27/2018   Procedure: TOTAL KNEE ARTHROPLASTY;  Surgeon: Elsie Saas, MD;  Location: [facility];  Service: Orthopedics;  Laterality: Left;   Family History  Problem Relation Age of Onset  . Cancer Mother        renal cell  . Migraines Mother   . Dementia Father   . Heart disease Father        mid 19s  . Other Brother        died 85- out for run- no autopsy  . Hyperlipidemia Brother   . Breast cancer Neg Hx    Social History   Socioeconomic History  . Marital status: Married    Spouse name: Not on file  . Number of children: 0  . Years of education: 37  . Highest education level: Not on file  Occupational History  . 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 Occupation: CLINICAL NURSE CARE    Employer: [employer]  Tobacco Use  . Smoking status: Current Every Day Smoker    Packs/day: 0.75    Years: 50.00    Pack years: 37.50    Types: Cigarettes  . Smokeless tobacco: Never Used  . Tobacco comment: Discussed reduce to quit, gave card with resources to help in the community  Substance and Sexual Activity  . Alcohol use: Yes    Comment: 12/16/2016 "1-2 glasses of wine per year"  . Drug use: No  . Sexual activity: Not Currently  Other Topics Concern  . Not on file  Social History Narrative   Lives at home w/ husband and 2 dogs   Patient is right handed.   Patient drinks one cup caffeine.   Social Determinants of Health   Financial Resource Strain:   . Difficulty of Paying Living Expenses: Not on file  Food Insecurity:   .  Worried About Charity fundraiser in the Last Year: Not on file  . Ran Out of Food in the Last Year: Not on file  Transportation Needs:   . Lack of Transportation (Medical): Not on file  . Lack of Transportation (Non-Medical): Not on file  Physical Activity:   . Days of Exercise per Week: Not on file  . Minutes of Exercise per Session: Not on file  Stress:   . Feeling of Stress : Not on file  Social Connections:   . Frequency of Communication with Friends and Family: Not on file  . Frequency of Social Gatherings with Friends and Family: Not on file  . Attends Religious Services: Not on file  . Active Member of Clubs or Organizations:  Not on file  . Attends Archivist Meetings: Not on file  . Marital Status: Not on file    Outpatient Encounter Medications as of 03/25/2019  Medication Sig  . acetaminophen (TYLENOL) 325 MG tablet Take 325 mg 2 (two) times daily as needed by mouth (pain).   Marland Kitchen albuterol (VENTOLIN HFA) 108 (90 Base) MCG/ACT inhaler Inhale 2 puffs into the lungs every 6 (six) hours as needed for wheezing or shortness of breath.  . ALPRAZolam (XANAX) 0.5 MG tablet Take 0.5 mg at bedtime as needed by mouth for anxiety or sleep. Anxiety or sleep  . apixaban (ELIQUIS) 2.5 MG TABS tablet Take 1 tablet (2.5 mg total) by mouth every 12 (twelve) hours.  Marland Kitchen BOTOX 200 units SOLR INJECT 200 UNITS  INTRAMUSCULARLY EVERY 3  MONTHS (GIVEN AT MD OFFICE, DISCARD UNUSED PORTION  AFTER FIRST USE)  . buPROPion (WELLBUTRIN XL) 300 MG 24 hr tablet Take 300 mg daily by mouth.   . butalbital-acetaminophen-caffeine (FIORICET) 50-325-40 MG tablet Take 1 tablet by mouth every 6 (six) hours as needed for headache.  . docusate sodium (COLACE) 100 MG capsule 1 tab 2 times a day while on narcotics.  STOOL SOFTENER  . dorzolamide-timolol (COSOPT) 22.3-6.8 MG/ML ophthalmic solution Place 1 drop 2 (two) times daily into both eyes.   . Hypromellose (ARTIFICIAL TEARS OP) Place 1 drop into both eyes  daily as needed (dry eyes).   . mupirocin ointment (BACTROBAN) 2 %   . Probiotic Product (ALIGN PO) Take 4 mg by mouth daily.   . promethazine (PHENERGAN) 25 MG tablet Take 1 tablet (25 mg total) by mouth every 6 (six) hours as needed for nausea or vomiting.  . sertraline (ZOLOFT) 100 MG tablet Take 100 mg by mouth daily with breakfast.   . Simethicone (GAS-X PO) Take 2 tablets daily as needed by mouth (gas).  . sodium chloride (OCEAN) 0.65 % SOLN nasal spray Place 1 spray as needed into both nostrils for congestion.  . SUMAtriptan (IMITREX) 100 MG tablet TAKE 1 TABLET BY MOUTH AT THE ONSET OF A HEADACHE DAILY AS NEEDED (Patient taking differently: Take 100 mg by mouth every 2 (two) hours as needed for headache. TAKE 1 TABLET BY MOUTH AT THE ONSET OF A HEADACHE DAILY AS NEEDED)  . [DISCONTINUED] oxyCODONE (OXY IR/ROXICODONE) 5 MG immediate release tablet 1 po q 4 hrs prn pain.  Patient had a total knee replacement on 12/28/2018  . [DISCONTINUED] Vitamin D, Ergocalciferol, (DRISDOL) 1.25 MG (50000 UT) CAPS capsule Take 1 capsule (50,000 Units total) by mouth every 7 (seven) days.   No facility-administered encounter medications on file as of 03/25/2019.    Activities of Daily Living In your present state of health, do you have any difficulty performing the following activities: 03/25/2019 12/27/2018  Hearing? N N  Vision? N N  Difficulty concentrating or making decisions? N N  Walking or climbing stairs? N N  Dressing or bathing? N N  Doing errands, shopping? N N  Preparing Food and eating ? N -  Using the Toilet? N -  In the past six months, have you accidently leaked urine? N -  Do you have problems with loss of bowel control? N -  Managing your Medications? N -  Managing your Finances? N -  Housekeeping or managing your Housekeeping? N -  Some recent data might be hidden    Patient Care Team: Marin Olp, MD as PCP - General (Family Medicine) Thompson Grayer, MD as PCP -  Cardiology (Cardiology) Kathrynn Ducking, MD as Consulting Physician (Neurology) Marius Ditch, MD as Consulting Physician (Sleep Medicine) Katy Apo, MD as Consulting Physician (Ophthalmology)    Assessment:   This is a routine wellness examination for [place].  Exercise Activities and Dietary recommendations Current Exercise Habits: The patient does not participate in regular exercise at present  Goals   None     Fall Risk Fall Risk  03/25/2019 11/05/2018 01/08/2018 12/26/2014  Falls in the past year? 1 0 0 No  Number falls in past yr: 0 0 - -  Injury with Fall? 0 0 - -  Risk for fall due to : Orthopedic patient - - -  Follow up Education provided;Falls evaluation completed;Falls prevention discussed - - -   Is the patient's home free of loose throw rugs in walkways, pet beds, electrical cords, etc?   yes      Grab bars in the bathroom? yes      Handrails on the stairs?   yes      Adequate lighting?   yes   Depression Screen PHQ 2/9 Scores 03/25/2019 11/05/2018 07/07/2018 01/08/2018  PHQ - 2 Score 2 2 1 1   PHQ- 9 Score - 5 3 4      Cognitive Function     6CIT Screen 03/25/2019  What Year? 0 points  What month? 0 points  What time? 0 points  Count back from 20 0 points  Months in reverse 0 points  Repeat phrase 0 points  Total Score 0    Immunization History  Administered Date(s) Administered  . Influenza, High Dose Seasonal PF 11/08/2013, 10/10/2016, 09/24/2018  . Influenza,inj,quad, With Preservative 11/07/2014  . Influenza-Unspecified 10/12/2016, 10/29/2017  . PFIZER SARS-COV-2 Vaccination 02/27/2019, 03/21/2019  . Pneumococcal Conjugate-13 03/27/2016  . Pneumococcal Polysaccharide-23 12/20/2013  . Tdap 02/02/2013    Qualifies for Shingles Vaccine?Discussed and patient will check with pharmacy for coverage.  Patient education handout provided    Screening Tests Health Maintenance  Topic Date Due  . MAMMOGRAM  09/27/2020  . TETANUS/TDAP  02/03/2023  .  COLONOSCOPY  05/24/2023  . INFLUENZA VACCINE  Completed  . DEXA SCAN  Completed  . Hepatitis C Screening  Completed  . PNA vac Low Risk Adult  Completed    Cancer Screenings: Lung: Low Dose CT Chest recommended if Age 20-80 years, 30 pack-year currently smoking OR have quit w/in 15years. Patient does qualify. Currently being followed by pulmonology.  Breast:  Up to date on Mammogram? Yes   Up to date of Bone Density/Dexa? Yes Colorectal: Colonoscopy 05/23/13 (repeat in 10 year)     Plan:  I have personally reviewed and addressed the Medicare Annual Wellness questionnaire and have noted the following in the patient's chart:  A. Medical and social history B. Use of alcohol, tobacco or illicit drugs  C. Current medications and supplements D. Functional ability and status E.  Nutritional status F.  Physical activity G. Advance directives H. List of other physicians I.  Hospitalizations, surgeries, and ER visits in previous 12 months J.  [place] such as hearing and vision if needed, cognitive and depression L. Referrals, records requested, and appointments- none   In addition, I have reviewed and discussed with patient certain preventive protocols, quality metrics, and best practice recommendations. A written personalized care plan for preventive services as well as general preventive health recommendations were provided to patient.   Signed,  Denman George, LPN  Nurse Health Advisor   Nurse Notes: Patient has  completed Covid vaccine

## 2019-03-28 DIAGNOSIS — R269 Unspecified abnormalities of gait and mobility: Secondary | ICD-10-CM | POA: Diagnosis not present

## 2019-03-28 DIAGNOSIS — M25662 Stiffness of left knee, not elsewhere classified: Secondary | ICD-10-CM | POA: Diagnosis not present

## 2019-03-28 DIAGNOSIS — M6281 Muscle weakness (generalized): Secondary | ICD-10-CM | POA: Diagnosis not present

## 2019-03-28 DIAGNOSIS — M1712 Unilateral primary osteoarthritis, left knee: Secondary | ICD-10-CM | POA: Diagnosis not present

## 2019-03-30 DIAGNOSIS — M25562 Pain in left knee: Secondary | ICD-10-CM | POA: Diagnosis not present

## 2019-03-30 DIAGNOSIS — M6281 Muscle weakness (generalized): Secondary | ICD-10-CM | POA: Diagnosis not present

## 2019-03-30 DIAGNOSIS — M1712 Unilateral primary osteoarthritis, left knee: Secondary | ICD-10-CM | POA: Diagnosis not present

## 2019-03-30 DIAGNOSIS — R262 Difficulty in walking, not elsewhere classified: Secondary | ICD-10-CM | POA: Diagnosis not present

## 2019-03-31 MED FILL — BUTALB-ACETAMIN-CAFF 50-325: 50-325-40 | 7 days supply | Qty: 30 | Fill #1

## 2019-04-01 DIAGNOSIS — M6281 Muscle weakness (generalized): Secondary | ICD-10-CM | POA: Diagnosis not present

## 2019-04-01 DIAGNOSIS — M1712 Unilateral primary osteoarthritis, left knee: Secondary | ICD-10-CM | POA: Diagnosis not present

## 2019-04-01 DIAGNOSIS — M25662 Stiffness of left knee, not elsewhere classified: Secondary | ICD-10-CM | POA: Diagnosis not present

## 2019-04-01 DIAGNOSIS — R269 Unspecified abnormalities of gait and mobility: Secondary | ICD-10-CM | POA: Diagnosis not present

## 2019-04-04 ENCOUNTER — Ambulatory Visit (INDEPENDENT_AMBULATORY_CARE_PROVIDER_SITE_OTHER): Payer: Medicare PPO | Admitting: *Deleted

## 2019-04-04 DIAGNOSIS — I480 Paroxysmal atrial fibrillation: Secondary | ICD-10-CM | POA: Diagnosis not present

## 2019-04-04 LAB — CUP PACEART REMOTE DEVICE CHECK
Date Time Interrogation Session: 20210228234449
Implantable Pulse Generator Implant Date: 20180830

## 2019-04-05 ENCOUNTER — Other Ambulatory Visit: Payer: Self-pay | Admitting: Internal Medicine

## 2019-04-05 MED FILL — ELIQUIS 5 MG TABLET: 5 | 90 days supply | Qty: 180 | Fill #0

## 2019-04-05 MED FILL — ALBUTEROL SULFATE HFA 108 (: 108 (90 BAS | 25 days supply | Qty: 9 | Fill #2

## 2019-04-05 NOTE — Telephone Encounter (Addendum)
Prescription refill request for Eliquis received.  Last office visit: 10/27/2018, Allred Scr: 0.63, 12/28/2018 Age: 69 y.o. Weight: 69 kg   Prescription refill sent.

## 2019-04-05 NOTE — Progress Notes (Signed)
ILR Remote 

## 2019-04-08 DIAGNOSIS — H401231 Low-tension glaucoma, bilateral, mild stage: Secondary | ICD-10-CM | POA: Diagnosis not present

## 2019-04-18 ENCOUNTER — Telehealth: Payer: Self-pay

## 2019-04-18 NOTE — Telephone Encounter (Signed)
Left message for patient regarding disconnected monitor.  

## 2019-04-19 DIAGNOSIS — M1712 Unilateral primary osteoarthritis, left knee: Secondary | ICD-10-CM | POA: Diagnosis not present

## 2019-04-19 MED FILL — AMOXICILLIN 500 MG CAPSULE: 500 | 5 days supply | Qty: 20 | Fill #0

## 2019-04-20 DIAGNOSIS — G4733 Obstructive sleep apnea (adult) (pediatric): Secondary | ICD-10-CM | POA: Diagnosis not present

## 2019-04-25 DIAGNOSIS — Z811 Family history of alcohol abuse and dependence: Secondary | ICD-10-CM | POA: Diagnosis not present

## 2019-04-25 DIAGNOSIS — G43909 Migraine, unspecified, not intractable, without status migrainosus: Secondary | ICD-10-CM | POA: Diagnosis not present

## 2019-04-25 DIAGNOSIS — Z881 Allergy status to other antibiotic agents status: Secondary | ICD-10-CM | POA: Diagnosis not present

## 2019-04-25 DIAGNOSIS — I4891 Unspecified atrial fibrillation: Secondary | ICD-10-CM | POA: Diagnosis not present

## 2019-04-25 DIAGNOSIS — F325 Major depressive disorder, single episode, in full remission: Secondary | ICD-10-CM | POA: Diagnosis not present

## 2019-04-25 DIAGNOSIS — Z818 Family history of other mental and behavioral disorders: Secondary | ICD-10-CM | POA: Diagnosis not present

## 2019-04-25 DIAGNOSIS — F419 Anxiety disorder, unspecified: Secondary | ICD-10-CM | POA: Diagnosis not present

## 2019-04-25 DIAGNOSIS — H409 Unspecified glaucoma: Secondary | ICD-10-CM | POA: Diagnosis not present

## 2019-04-25 DIAGNOSIS — D6869 Other thrombophilia: Secondary | ICD-10-CM | POA: Diagnosis not present

## 2019-04-25 DIAGNOSIS — I4892 Unspecified atrial flutter: Secondary | ICD-10-CM | POA: Diagnosis not present

## 2019-04-25 DIAGNOSIS — Z823 Family history of stroke: Secondary | ICD-10-CM | POA: Diagnosis not present

## 2019-04-25 DIAGNOSIS — J45909 Unspecified asthma, uncomplicated: Secondary | ICD-10-CM | POA: Diagnosis not present

## 2019-04-25 DIAGNOSIS — R110 Nausea: Secondary | ICD-10-CM | POA: Diagnosis not present

## 2019-04-25 DIAGNOSIS — Z8249 Family history of ischemic heart disease and other diseases of the circulatory system: Secondary | ICD-10-CM | POA: Diagnosis not present

## 2019-04-26 ENCOUNTER — Encounter: Payer: Self-pay | Admitting: Family Medicine

## 2019-04-26 DIAGNOSIS — F331 Major depressive disorder, recurrent, moderate: Secondary | ICD-10-CM | POA: Diagnosis not present

## 2019-04-26 DIAGNOSIS — F411 Generalized anxiety disorder: Secondary | ICD-10-CM | POA: Diagnosis not present

## 2019-04-27 ENCOUNTER — Other Ambulatory Visit: Payer: Self-pay

## 2019-04-27 DIAGNOSIS — Z8659 Personal history of other mental and behavioral disorders: Secondary | ICD-10-CM

## 2019-04-28 MED FILL — BUTALBITAL-APAP-CAFFEINE 50: 50-325-40 | 7 days supply | Qty: 30 | Fill #2

## 2019-04-28 MED FILL — ALPRAZolam 0.5 MG TABS: 0.5 | 30 days supply | Qty: 60 | Fill #1

## 2019-05-09 ENCOUNTER — Ambulatory Visit (INDEPENDENT_AMBULATORY_CARE_PROVIDER_SITE_OTHER): Payer: Medicare PPO | Admitting: *Deleted

## 2019-05-09 DIAGNOSIS — I480 Paroxysmal atrial fibrillation: Secondary | ICD-10-CM | POA: Diagnosis not present

## 2019-05-10 LAB — CUP PACEART REMOTE DEVICE CHECK
Date Time Interrogation Session: 20210401004800
Implantable Pulse Generator Implant Date: 20180830

## 2019-05-11 ENCOUNTER — Telehealth: Payer: Self-pay | Admitting: *Deleted

## 2019-05-11 NOTE — Telephone Encounter (Signed)
05/11/2019 I called [employer] [phone] and spoke to [place]. She states that [id number] and [phone] are billable.  [id number] will need PA.  Ref# for call is [id number].  I called [phone] and spoke to [place].  She states that there is already a PA on file.  [id number] Valid from 02/22/2019-02/03/2020.

## 2019-05-19 ENCOUNTER — Other Ambulatory Visit: Payer: Self-pay

## 2019-05-19 ENCOUNTER — Encounter: Payer: Self-pay | Admitting: Neurology

## 2019-05-19 ENCOUNTER — Ambulatory Visit (INDEPENDENT_AMBULATORY_CARE_PROVIDER_SITE_OTHER): Payer: Medicare PPO | Admitting: Neurology

## 2019-05-19 VITALS — BP 126/61 | HR 69 | Ht 67.0 in | Wt 154.0 lb

## 2019-05-19 DIAGNOSIS — G43719 Chronic migraine without aura, intractable, without status migrainosus: Secondary | ICD-10-CM | POA: Diagnosis not present

## 2019-05-19 MED ORDER — ONABOTULINUMTOXINA 100 UNITS IJ SOLR
200.0000 [IU] | Freq: Once | INTRAMUSCULAR | Status: AC
  Administered 2019-05-19: 200 [IU] via INTRAMUSCULAR

## 2019-05-19 NOTE — Progress Notes (Signed)
Please refer to Botox procedure note. 

## 2019-05-19 NOTE — Procedures (Signed)
     BOTOX PROCEDURE NOTE FOR MIGRAINE HEADACHE   HISTORY: Suzanne [name] is a 69 year old patient with intractable migraine headache.  The patient is had increased headaches recently following the death of her older sister.  The patient has not been sleeping well, she has been under higher stress levels.  She comes in for another Botox injection.   Description of procedure:  The patient was placed in a sitting position. The standard protocol was used for Botox as follows, with 5 units of Botox injected at each site:   -Procerus muscle, midline injection  -Corrugator muscle, bilateral injection  -Frontalis muscle, bilateral injection, with 2 sites each side, medial injection was performed in the upper one third of the frontalis muscle, in the region vertical from the medial inferior edge of the superior orbital rim. The lateral injection was again in the upper one third of the forehead vertically above the lateral limbus of the cornea, 1.5 cm lateral to the medial injection site.  -Temporalis muscle injection, 4 sites, bilaterally. The first injection was 3 cm above the tragus of the ear, second injection site was 1.5 cm to 3 cm up from the first injection site in line with the tragus of the ear. The third injection site was 1.5-3 cm forward between the first 2 injection sites. The fourth injection site was 1.5 cm posterior to the second injection site.  -Occipitalis muscle injection, 3 sites, bilaterally. The first injection was done one half way between the occipital protuberance and the tip of the mastoid process behind the ear. The second injection site was done lateral and superior to the first, 1 fingerbreadth from the first injection. The third injection site was 1 fingerbreadth superiorly and medially from the first injection site.  -Cervical paraspinal muscle injection, 2 sites, bilateral, the first injection site was 1 cm from the midline of the cervical spine, 3 cm inferior to the  lower border of the occipital protuberance. The second injection site was 1.5 cm superiorly and laterally to the first injection site.  -Trapezius muscle injection was performed at 3 sites, bilaterally. The first injection site was in the upper trapezius muscle halfway between the inflection point of the neck, and the acromion. The second injection site was one half way between the acromion and the first injection site. The third injection was done between the first injection site and the inflection point of the neck.   A 200 unit bottle of Botox was used, 155 units were injected, the rest of the Botox was wasted. The patient tolerated the procedure well, there were no complications of the above procedure.  Botox NDC [id number] Lot number [id number] Expiration date November 2023

## 2019-05-23 ENCOUNTER — Other Ambulatory Visit: Payer: Self-pay | Admitting: Neurology

## 2019-05-23 MED FILL — SUMATRIPTAN SUCC 100 MG TAB: 100 | 90 days supply | Qty: 27 | Fill #2

## 2019-05-24 ENCOUNTER — Other Ambulatory Visit (HOSPITAL_COMMUNITY): Payer: Self-pay | Admitting: Ophthalmology

## 2019-05-24 MED FILL — DORZOLAMIDE HCL-TIMOLOL MAL: 22.3-6.8 | 90 days supply | Qty: 20 | Fill #0

## 2019-06-01 ENCOUNTER — Other Ambulatory Visit: Payer: Self-pay

## 2019-06-01 ENCOUNTER — Ambulatory Visit: Payer: Medicare PPO | Admitting: Neurology

## 2019-06-01 ENCOUNTER — Encounter: Payer: Self-pay | Admitting: Neurology

## 2019-06-01 VITALS — BP 127/80 | HR 59 | Temp 98.0°F | Ht 67.0 in | Wt 151.8 lb

## 2019-06-01 DIAGNOSIS — G43719 Chronic migraine without aura, intractable, without status migrainosus: Secondary | ICD-10-CM

## 2019-06-01 MED ORDER — BUTALBITAL-APAP-CAFFEINE 50-325-40 MG PO TABS: 1.0000 | ORAL_TABLET | Freq: Four times a day (QID) | ORAL | 2 refills | Status: DC | PRN

## 2019-06-01 MED FILL — BUTALB-ACETAMIN-CAFF 50-325: 50-325-40 | 7 days supply | Qty: 30 | Fill #0

## 2019-06-01 NOTE — Progress Notes (Signed)
PATIENT: Suzanne [name] DOB: 1950-06-11  REASON FOR VISIT: follow up HISTORY FROM: patient  HISTORY OF PRESENT ILLNESS: Today 06/01/19  [name] is a 69 year old female with history of migraine headache.  She remains on Botox therapy. She was last given Botox May 19, 2019. She has historically benefited well from Botox. However, over the last year, questions if the benefit may be wearing off. She presents today, reporting a 6-day headache, currently 3/10. She is usually prescribed Fioricet, but was given less recently and has ran out of the medication. She has been taking Imitrex, but this only works for about 12 hours, has been taking for the last 4 days. She is not able to take NSAID as she is on Eliquis. She is hesitant to use steroids, had a left knee replacement in November. She has Phenergan to take if needed. Of lately, she has had an increase in headache, likely as she just received Botox, along with stress related to the loss of her sister, and undergoing a remodel project at home. She presents today reporting her typical migraine pattern, she presents for follow-up unaccompanied.  HISTORY 06/01/2018 Dr. Jannifer Franklin: Suzanne [name] is a 69 year old right-handed white female with history of migraine headache.  The patient is on Botox therapy, she has gained good improvement with the headaches.  She still has some occasional headaches that she requires Imitrex for, but her headaches never become extremely severe, they may get to a level of 2 through 4, but never more severe than that.  At times she may have nausea, she no longer has Phenergan to take.  The patient overall is pleased with her current therapy.  The Imitrex is still effective.  She has Fioricet to take as well if needed.  She is due for her next Botox injection in June 2020.  REVIEW OF SYSTEMS: Out of a complete 14 system review of symptoms, the patient complains only of the following symptoms, and all other reviewed  systems are negative.  Headache  ALLERGIES: Allergies  Allergen Reactions  . Dilaudid [Hydromorphone Hcl] Nausea And Vomiting  . Fentanyl Nausea And Vomiting  . Ciprofloxacin Rash  . Erythromycin Rash  . Percocet [Oxycodone-Acetaminophen] Nausea And Vomiting    HOME MEDICATIONS: Outpatient Medications Prior to Visit  Medication Sig Dispense Refill  . acetaminophen (TYLENOL) 325 MG tablet Take 325 mg 2 (two) times daily as needed by mouth (pain).     Marland Kitchen albuterol (VENTOLIN HFA) 108 (90 Base) MCG/ACT inhaler Inhale 2 puffs into the lungs every 6 (six) hours as needed for wheezing or shortness of breath. 1 Inhaler 2  . ALPRAZolam (XANAX) 0.5 MG tablet Take 0.5 mg at bedtime as needed by mouth for anxiety or sleep. Anxiety or sleep    . BOTOX 200 units SOLR INJECT 200 UNITS  INTRAMUSCULARLY EVERY 3  MONTHS (GIVEN AT MD OFFICE, DISCARD UNUSED PORTION  AFTER FIRST USE) 1 each 3  . buPROPion (WELLBUTRIN XL) 300 MG 24 hr tablet Take 300 mg daily by mouth.     . dorzolamide-timolol (COSOPT) 22.3-6.8 MG/ML ophthalmic solution Place 1 drop 2 (two) times daily into both eyes.   3  . ELIQUIS 5 MG TABS tablet TAKE 1 TABLET BY MOUTH TWICE DAILY 180 tablet 1  . Hypromellose (ARTIFICIAL TEARS OP) Place 1 drop into both eyes daily as needed (dry eyes).     . Probiotic Product (ALIGN PO) Take 4 mg by mouth daily.     . promethazine (PHENERGAN) 25 MG  tablet Take 1 tablet (25 mg total) by mouth every 6 (six) hours as needed for nausea or vomiting. 20 tablet 1  . sertraline (ZOLOFT) 100 MG tablet Take 100 mg by mouth daily with breakfast.     . Simethicone (GAS-X PO) Take 2 tablets daily as needed by mouth (gas).    . sodium chloride (OCEAN) 0.65 % SOLN nasal spray Place 1 spray as needed into both nostrils for congestion.    . SUMAtriptan (IMITREX) 100 MG tablet TAKE 1 TABLET BY MOUTH AT THE ONSET OF A HEADACHE DAILY AS NEEDED (Patient taking differently: Take 100 mg by mouth every 2 (two) hours as needed  for headache. TAKE 1 TABLET BY MOUTH AT THE ONSET OF A HEADACHE DAILY AS NEEDED) 27 tablet 3  . butalbital-acetaminophen-caffeine (FIORICET) 50-325-40 MG tablet Take 1 tablet by mouth every 6 (six) hours as needed for headache. 30 tablet 2   No facility-administered medications prior to visit.    PAST MEDICAL HISTORY: Past Medical History:  Diagnosis Date  . AICD (automatic cardioverter/defibrillator) present    loop recorder  . Anxiety   . Anxiety state 12/14/2018  . Asthma   . Atrial fibrillation ([place])    s/p ablations  . Atrial flutter ([place])   . COPD (chronic obstructive pulmonary disease) ([place])   . Degenerative arthritis    "maybe in my left knee" (12/16/2016)  . Depression   . Dysrhythmia 2010   A-fib  . Frozen shoulder    hx bilaterally; "had to go thru PT; no problem w/them since I learned exercises" (12/16/2016)  . GERD (gastroesophageal reflux disease)   . Glaucoma   . H/O: rheumatic fever 1957  . High cholesterol   . Migraine    "getting botox now; now only 2-3/month" (12/16/2016)  . PONV (postoperative nausea and vomiting)    with Fentanyl,  Dilaudid &  Propofol (12/16/2016)    PAST SURGICAL HISTORY: Past Surgical History:  Procedure Laterality Date  . ABDOMINAL HYSTERECTOMY  1990   right ovary removed  . APPENDECTOMY  1990  . ATRIAL FIBRILLATION ABLATION N/A 12/16/2016   Procedure: ATRIAL FIBRILLATION ABLATION;  Surgeon: Thompson Grayer, MD;  Location: [facility];  Service: Cardiovascular;  Laterality: N/A;  . CATARACT EXTRACTION, BILATERAL    . CTI ablation  3/11  . ESOPHAGOGASTRODUODENOSCOPY (EGD) WITH PROPOFOL N/A 01/06/2013   Procedure: ESOPHAGOGASTRODUODENOSCOPY (EGD) WITH PROPOFOL;  Surgeon: Cleotis Nipper, MD;  Location: [facility];  Service: Endoscopy;  Laterality: N/A;  . KNEE ARTHROSCOPY Bilateral 1999-2006  . KNEE SURGERY Bilateral [id number]   "open; for congenital deformity"  . LOOP RECORDER INSERTION N/A 10/02/2016   Procedure: LOOP  RECORDER INSERTION;  Surgeon: Thompson Grayer, MD;  Location: [facility];  Service: Cardiovascular;  Laterality: N/A;  . PERIPHERAL VASCULAR INTERVENTION  2/11  & 6/11  . RHINOPLASTY  1965  . TONSILLECTOMY  as child  . TOOTH EXTRACTION  2009   2 teeth extractted  . TOTAL KNEE ARTHROPLASTY Left 12/27/2018   Procedure: TOTAL KNEE ARTHROPLASTY;  Surgeon: Elsie Saas, MD;  Location: [facility];  Service: Orthopedics;  Laterality: Left;    FAMILY HISTORY: Family History  Problem Relation Age of Onset  . Cancer Mother        renal cell  . Migraines Mother   . Dementia Father   . Heart disease Father        mid 57s  . Other Brother        died 78- out  for run- no autopsy  . Hyperlipidemia Brother   . Breast cancer Neg Hx     SOCIAL HISTORY: Social History   Socioeconomic History  . Marital status: Married    Spouse name: Not on file  . Number of children: 0  . Years of education: 51  . Highest education level: Not on file  Occupational History  . 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 Occupation: CLINICAL NURSE CARE    Employer: [employer]  Tobacco Use  . Smoking status: Current Every Day Smoker    Packs/day: 0.75    Years: 50.00    Pack years: 37.50    Types: Cigarettes  . Smokeless tobacco: Never Used  . Tobacco comment: Discussed reduce to quit, gave card with resources to help in the community  Substance and Sexual Activity  . Alcohol use: Yes    Comment: 12/16/2016 "1-2 glasses of wine per year"  . Drug use: No  . Sexual activity: Not Currently  Other Topics Concern  . Not on file  Social History Narrative   Lives at home w/ husband and 2 dogs   Patient is right handed.   Patient drinks one cup caffeine.   Social Determinants of Health   Financial Resource Strain:   . Difficulty of Paying Living Expenses:   Food Insecurity:   . Worried About Charity fundraiser in the Last Year:   . Arboriculturist in the Last  Year:   Transportation Needs:   . Film/video editor (Medical):   Marland Kitchen Lack of Transportation (Non-Medical):   Physical Activity:   . Days of Exercise per Week:   . Minutes of Exercise per Session:   Stress:   . Feeling of Stress :   Social Connections:   . Frequency of Communication with Friends and Family:   . Frequency of Social Gatherings with Friends and Family:   . Attends Religious Services:   . Active Member of Clubs or Organizations:   . Attends Archivist Meetings:   Marland Kitchen Marital Status:   Intimate Partner Violence:   . Fear of Current or Ex-Partner:   . Emotionally Abused:   Marland Kitchen Physically Abused:   . Sexually Abused:    PHYSICAL EXAM  Vitals:   06/01/19 1117  BP: 127/80  Pulse: (!) 59  Temp: 98 F (36.7 C)  SpO2: 96%  Weight: 151 lb 12.8 oz (68.9 kg)  Height: 5\' 7"  (1.702 m)   Body mass index is 23.78 kg/m.  Generalized: Well developed, in no acute distress   Neurological examination  Mentation: Alert oriented to time, place, history taking. Follows all commands speech and language fluent Cranial nerve II-XII: Pupils were equal round reactive to light. Extraocular movements were full, visual field were full on confrontational test. Facial sensation and strength were normal. Head turning and shoulder shrug  were normal and symmetric. Motor: The motor testing reveals 5 over 5 strength of all 4 extremities. Good symmetric motor tone is noted throughout.  Sensory: Sensory testing is intact to soft touch on all 4 extremities. No evidence of extinction is noted.  Coordination: Cerebellar testing reveals good finger-nose-finger and heel-to-shin bilaterally.  Gait and station: Gait is normal. Tandem gait is normal. Romberg is negative. No drift is seen.  Reflexes: Deep tendon reflexes are symmetric and normal bilaterally (left knee not checked, pain to left knee)  DIAGNOSTIC DATA (LABS, IMAGING, TESTING) - I reviewed  patient records, labs, notes, testing and  imaging myself where available.  Lab Results  Component Value Date   WBC 15.4 (H) 12/28/2018   HGB 10.4 (L) 12/28/2018   HCT 33.2 (L) 12/28/2018   MCV 102.2 (H) 12/28/2018   PLT 145 (L) 12/28/2018      Component Value Date/Time   NA 138 12/28/2018 0320   NA 141 12/05/2016 1407   K 4.6 12/28/2018 0320   CL 108 12/28/2018 0320   CO2 23 12/28/2018 0320   GLUCOSE 147 (H) 12/28/2018 0320   BUN 12 12/28/2018 0320   BUN 11 12/05/2016 1407   CREATININE 0.63 12/28/2018 0320   CALCIUM 8.6 (L) 12/28/2018 0320   PROT 7.1 12/17/2018 1204   ALBUMIN 4.3 12/17/2018 1204   AST 16 12/17/2018 1204   ALT 15 12/17/2018 1204   ALKPHOS 93 12/17/2018 1204   BILITOT 0.3 12/17/2018 1204   GFRNONAA >60 12/28/2018 0320   GFRAA >60 12/28/2018 0320   Lab Results  Component Value Date   CHOL 258 (H) 11/12/2018   HDL 65.00 11/12/2018   LDLCALC 168 (H) 11/12/2018   TRIG 127.0 11/12/2018   CHOLHDL 4 11/12/2018   No results found for: HGBA1C No results found for: VITAMINB12 Lab Results  Component Value Date   TSH 2.755 12/02/2013      ASSESSMENT AND PLAN 69 y.o. year old female  has a past medical history of AICD (automatic cardioverter/defibrillator) present, Anxiety, Anxiety state (12/14/2018), Asthma, Atrial fibrillation ([place]), Atrial flutter ([place]), COPD (chronic obstructive pulmonary disease) ([place]), Degenerative arthritis, Depression, Dysrhythmia (2010), Frozen shoulder, GERD (gastroesophageal reflux disease), Glaucoma, H/O: rheumatic fever (1957), High cholesterol, Migraine, and PONV (postoperative nausea and vomiting). here with:  1. Chronic migraine headache  She recently received Botox 05/19/2019. Today's visit is her annual follow-up appointment. She has had a headache for the last 6 days, as she ran out of Fioricet, her prescription was filled for less than the normal amount. I will refill the Fioricet. She will remain on Botox, and Imitrex and Phenergan as needed. She mentioned in the  future possibly considering a Botox break, possibly trying a CGRP medication. For acute headache, we also discussed considering Ubrelvy or Nurtec. I will see her on an annual basis for follow-up, otherwise she will be seen every 3 months for Botox.  I spent 20 minutes of face-to-face and non-face-to-face time with patient.  This included previsit chart review, lab review, study review, order entry, electronic health record documentation, patient education.  Butler Denmark, AGNP-C, DNP 06/01/2019, 12:17 PM [facility] [address], [place], [place] [address] [phone]

## 2019-06-01 NOTE — Progress Notes (Signed)
I have read the note, and I agree with the clinical assessment and plan.  Samier Jaco K Trayonna Bachmeier   

## 2019-06-01 NOTE — Patient Instructions (Signed)
I will fill the Fioricet  Continue Botox Consider Roselyn Meier if needed See you back in 1 year

## 2019-06-09 LAB — CUP PACEART REMOTE DEVICE CHECK
Date Time Interrogation Session: 20210502005111
Implantable Pulse Generator Implant Date: 20180830

## 2019-06-13 ENCOUNTER — Ambulatory Visit (INDEPENDENT_AMBULATORY_CARE_PROVIDER_SITE_OTHER): Payer: Medicare PPO | Admitting: *Deleted

## 2019-06-13 DIAGNOSIS — I480 Paroxysmal atrial fibrillation: Secondary | ICD-10-CM

## 2019-06-14 NOTE — Progress Notes (Signed)
Carelink Summary Report / Loop Recorder 

## 2019-06-21 MED FILL — buPROPion HCL ER (XL) 300 M: 300 | 90 days supply | Qty: 90 | Fill #0

## 2019-06-21 MED FILL — SERTRALINE HCL 100 MG TAB: 100 | 90 days supply | Qty: 90 | Fill #0

## 2019-06-24 ENCOUNTER — Other Ambulatory Visit: Payer: Self-pay | Admitting: Family Medicine

## 2019-06-24 MED FILL — ALBUTEROL SULFATE HFA 108 (: 108 (90 BAS | 25 days supply | Qty: 9 | Fill #0

## 2019-06-27 DIAGNOSIS — L308 Other specified dermatitis: Secondary | ICD-10-CM | POA: Diagnosis not present

## 2019-06-27 MED FILL — DESONIDE 0.05 % OINT: 0.05 | 7 days supply | Qty: 15 | Fill #0

## 2019-06-27 MED FILL — BUTALB-ACETAMIN-CAFF 50-325: 50-325-40 | 7 days supply | Qty: 30 | Fill #1

## 2019-06-30 NOTE — Patient Instructions (Addendum)
Please stop by lab before you go If you have mychart- we will send your results within 3 business days of [place] receiving them.  If you do not have mychart- we will call you about results within 5 business days of [place] receiving them.   Congratulations on the decrease in smoking!  Schedule your physical for 2022 at check out before you leave.

## 2019-06-30 NOTE — Progress Notes (Signed)
Phone: [phone]   Subjective:  Patient presents today for their annual physical. Chief complaint-noted.    See problem oriented charting- Review of Systems  Constitutional: Negative for chills and fever.  HENT: Negative for ear pain and hearing loss.   Eyes: Negative for blurred vision and double vision.  Respiratory: Positive for cough and wheezing.   Cardiovascular: Negative for chest pain and palpitations.  Gastrointestinal: Positive for abdominal pain (ruq rarely pain). Negative for heartburn and vomiting.  Genitourinary: Negative for dysuria and urgency.  Musculoskeletal: Positive for falls (1 fall workmen left something in hallway and it was dark and she didnt see it so tripped over it ). Negative for joint pain (no after surgery ).  Skin: Negative for itching and rash.  Neurological: Positive for headaches. Negative for dizziness.  Endo/Heme/Allergies: Negative for polydipsia. Does not bruise/bleed easily.  Psychiatric/Behavioral: Negative for depression, substance abuse and suicidal ideas.   The following were reviewed and entered/updated in epic: Past Medical History:  Diagnosis Date  . AICD (automatic cardioverter/defibrillator) present    loop recorder  . Anxiety   . Anxiety state 12/14/2018  . Asthma   . Atrial fibrillation ([place])    s/p ablations  . Atrial flutter ([place])   . COPD (chronic obstructive pulmonary disease) ([place])   . Degenerative arthritis    "maybe in my left knee" (12/16/2016)  . Depression   . Dysrhythmia 2010   A-fib  . Frozen shoulder    hx bilaterally; "had to go thru PT; no problem w/them since I learned exercises" (12/16/2016)  . GERD (gastroesophageal reflux disease)   . Glaucoma   . H/O: rheumatic fever 1957  . High cholesterol   . Migraine    "getting botox now; now only 2-3/month" (12/16/2016)  . PONV (postoperative nausea and vomiting)    with Fentanyl,  Dilaudid &  Propofol (12/16/2016)   Patient Active Problem List    Diagnosis Date Noted  . Paroxysmal atrial fibrillation [facility] s/p ablation november 2018, 2011 previously 12/16/2016    Priority: High  . Tobacco abuse 10/03/2013    Priority: High  . COPD (chronic obstructive pulmonary disease) ([place]) 02/01/2009    Priority: High  . Glaucoma 01/08/2018    Priority: Medium  . Primary localized osteoarthritis of left knee 01/08/2018    Priority: Medium  . High cholesterol     Priority: Medium  . Intractable chronic migraine without aura 05/17/2014    Priority: Medium  . Depression, major, single episode, complete remission ([place]) 02/01/2009    Priority: Medium  . Asthma 02/01/2009    Priority: Medium  . Low vitamin D level 01/08/2018    Priority: Low  . Nasal injury 10/20/2016    Priority: Low  . S/P ablation of atrial fibrillation 05/16/2013    Priority: Low  . Anxiety state 12/14/2018   Past Surgical History:  Procedure Laterality Date  . ABDOMINAL HYSTERECTOMY  1990   right ovary removed  . APPENDECTOMY  1990  . ATRIAL FIBRILLATION ABLATION N/A 12/16/2016   Procedure: ATRIAL FIBRILLATION ABLATION;  Surgeon: Thompson Grayer, MD;  Location: [facility];  Service: Cardiovascular;  Laterality: N/A;  . CATARACT EXTRACTION, BILATERAL    . CTI ablation  3/11  . ESOPHAGOGASTRODUODENOSCOPY (EGD) WITH PROPOFOL N/A 01/06/2013   Procedure: ESOPHAGOGASTRODUODENOSCOPY (EGD) WITH PROPOFOL;  Surgeon: Cleotis Nipper, MD;  Location: [facility];  Service: Endoscopy;  Laterality: N/A;  . KNEE ARTHROSCOPY Bilateral 1999-2006  . KNEE SURGERY Bilateral [id number]   "open; for congenital  deformity"  . LOOP RECORDER INSERTION N/A 10/02/2016   Procedure: LOOP RECORDER INSERTION;  Surgeon: Thompson Grayer, MD;  Location: [facility];  Service: Cardiovascular;  Laterality: N/A;  . PERIPHERAL VASCULAR INTERVENTION  2/11  & 6/11  . RHINOPLASTY  1965  . TONSILLECTOMY  as child  . TOOTH EXTRACTION  2009   2 teeth extractted  . TOTAL KNEE ARTHROPLASTY Left  12/27/2018   Procedure: TOTAL KNEE ARTHROPLASTY;  Surgeon: Elsie Saas, MD;  Location: [facility];  Service: Orthopedics;  Laterality: Left;    Family History  Problem Relation Age of Onset  . Cancer Mother        renal cell  . Migraines Mother   . Dementia Father   . Heart disease Father        mid 10s  . Other Brother        died 31- out for run- no autopsy  . Hyperlipidemia Brother   . Breast cancer Neg Hx     Medications- reviewed and updated Current Outpatient Medications  Medication Sig Dispense Refill  . acetaminophen (TYLENOL) 325 MG tablet Take 325 mg 2 (two) times daily as needed by mouth (pain).     Marland Kitchen albuterol (VENTOLIN HFA) 108 (90 Base) MCG/ACT inhaler INHALE 2 PUFFS INTO THE LUNGS EVERY 6 HOURS AS NEEDED FOR WHEEZING OR SHORTNESS OF BREATH. 8.5 g 2  . ALPRAZolam (XANAX) 0.5 MG tablet Take 0.5 mg at bedtime as needed by mouth for anxiety or sleep. Anxiety or sleep    . BOTOX 200 units SOLR INJECT 200 UNITS  INTRAMUSCULARLY EVERY 3  MONTHS (GIVEN AT MD OFFICE, DISCARD UNUSED PORTION  AFTER FIRST USE) 1 each 3  . buPROPion (WELLBUTRIN XL) 300 MG 24 hr tablet Take 300 mg daily by mouth.     . butalbital-acetaminophen-caffeine (FIORICET) 50-325-40 MG tablet Take 1 tablet by mouth every 6 (six) hours as needed for headache. 30 tablet 2  . dorzolamide-timolol (COSOPT) 22.3-6.8 MG/ML ophthalmic solution Place 1 drop 2 (two) times daily into both eyes.   3  . ELIQUIS 5 MG TABS tablet TAKE 1 TABLET BY MOUTH TWICE DAILY 180 tablet 1  . Hypromellose (ARTIFICIAL TEARS OP) Place 1 drop into both eyes daily as needed (dry eyes).     . Probiotic Product (ALIGN PO) Take 4 mg by mouth daily.     . promethazine (PHENERGAN) 25 MG tablet Take 1 tablet (25 mg total) by mouth every 6 (six) hours as needed for nausea or vomiting. 20 tablet 1  . sertraline (ZOLOFT) 100 MG tablet Take 100 mg by mouth daily with breakfast.     . Simethicone (GAS-X PO) Take 2 tablets daily as needed by mouth  (gas).    . sodium chloride (OCEAN) 0.65 % SOLN nasal spray Place 1 spray as needed into both nostrils for congestion.    . SUMAtriptan (IMITREX) 100 MG tablet TAKE 1 TABLET BY MOUTH AT THE ONSET OF A HEADACHE DAILY AS NEEDED (Patient taking differently: Take 100 mg by mouth every 2 (two) hours as needed for headache. TAKE 1 TABLET BY MOUTH AT THE ONSET OF A HEADACHE DAILY AS NEEDED) 27 tablet 3   No current facility-administered medications for this visit.    Allergies-reviewed and updated Allergies  Allergen Reactions  . Dilaudid [Hydromorphone Hcl] Nausea And Vomiting  . Fentanyl Nausea And Vomiting  . Ciprofloxacin Rash  . Erythromycin Rash  . Percocet [Oxycodone-Acetaminophen] Nausea And Vomiting    Social History  Social History Narrative   Lives at home w/ husband and 2 dogs   Patient is right handed.   Patient drinks one cup caffeine.   Objective  Objective:  BP 110/72   Pulse 65   Temp 98.4 F (36.9 C)   Ht 5\' 7"  (1.702 m)   Wt 149 lb 8 oz (67.8 kg)   SpO2 98%   BMI 23.42 kg/m  Gen: NAD, resting comfortably HEENT: Mucous membranes are moist. Oropharynx normal Neck: no thyromegaly CV: RRR no murmurs rubs or gallops Lungs: CTAB no crackles, wheeze, rhonchi Abdomen: soft/nontender/nondistended/normal bowel sounds. No rebound or guarding.  Ext: no edema Skin: warm, dry Neuro: grossly normal, moves all extremities, PERRLA   Assessment and Plan   69 y.o. female presenting for annual physical.  Health Maintenance counseling: 1. Anticipatory guidance: Patient counseled regarding regular dental exams q6 months, eye exams- 3x a year with glaucoma,  avoiding smoking and second hand smoke- see below , limiting alcohol to 0 beverage per day other than rare sip 2. Risk factor reduction:  Advised patient of need for regular exercise and diet rich and fruits and vegetables to reduce risk of heart attack and stroke. Exercise- daily walking 20 min-hour. Diet-healthy diet  (salmon, veggies and fruits).  Wt Readings from Last 3 Encounters:  06/01/19 151 lb 12.8 oz (68.9 kg)  05/19/19 154 lb (69.9 kg)  12/27/18 152 lb 1.9 oz (69 kg)  3. Immunizations/screenings/ancillary studies- shingles in the past- wanting to hold off on shingrix for now due to covid Immunization History  Administered Date(s) Administered  . Influenza, High Dose Seasonal PF 11/08/2013, 10/10/2016, 09/24/2018  . Influenza,inj,quad, With Preservative 11/07/2014  . Influenza-Unspecified 10/12/2016, 10/29/2017  . PFIZER SARS-COV-2 Vaccination 02/27/2019, 03/21/2019  . Pneumococcal Conjugate-13 03/27/2016  . Pneumococcal Polysaccharide-23 12/20/2013  . Tdap 02/02/2013  4. Cervical cancer screening- Follows with Laurin Coder- bimanual exams continue 5. Breast cancer screening-  breast exam with [place] and mammogram -3d 09/28/2018 up to date 6. Colon cancer screening - May 23, 2013 with 10-year repeat planned 7. Skin cancer screening-follows with dermatology yearly.- saw last week. advised regular sunscreen use. Denies worrisome, changing, or new skin lesions.  8. Birth control/STD check- monogamous and postmenopausal 9. Osteoporosis screening at 17- osteoporosis noted with worst T score -3.05 September 2018-patient reports was told to delay treatment for 8 months after knee surgery. She may consider seeing someone within [place] [facility] office that focuses on this.  -Current smoker-we will get urinalysis with labs.  She is enrolled in lung cancer screening program. 1/2 PPD to 15 a day- down to 5 a day and not smoking whole cigarette- working hard to cut back. When has quit cold [place] has restarted.    Status of chronic or acute concerns   #social update-  doing some mini trips and volunteering  # left knee replacement went well nov 2020.   # COPD/asthma/allergies S: Maintenance medications: None from pulmonary perspective- in past whenhad tried wasn't helpful.  For allergies-not taking  anything.   Had a lot of remodeling in the house and couldn't clean so well due to surgery in November for knee and spring pollen bothering her  Patient has had to use albuterol once per day.  No oxygen at home . Some wheezing.  A/P: reasonably stable control- continue current medicines  # Atrial fibrillation-Loop recorder in place  S: Rate controlled with no medication.  Has Cardizem on hand in case goes into atrial fibrillation but out  of date.  Anticoagulated with Eliquis Patient is  followed by cardiology: Dr. Rayann Heman  A/P: reasonable control- has follow up later this year with cardiology- continue current meds   % Depression/anxiety -follows with psychiatric nurse practitioner S: Medication: Sertraline 100 mg, bupropion 300Mg , Xanax 0.5Mg  A/P: reasonable control- continue current meds Depression screen [facility] 2/9 07/11/2019 03/25/2019 11/05/2018  Decreased Interest 0 1 1  Down, Depressed, Hopeless 0 1 1  PHQ - 2 Score 0 2 2  Altered sleeping 3 - 1  Tired, decreased energy 0 - 1  Change in appetite 0 - 0  Feeling bad or failure about yourself  0 - 1  Trouble concentrating 0 - 0  Moving slowly or fidgety/restless 0 - 0  Suicidal thoughts 0 - 0  PHQ-9 Score 3 - 5  Difficult doing work/chores Not difficult at all Not difficult at all Somewhat difficult   #Vitamin D deficiency/osteoporosis- see above S: Medication: 2000 units a day- occasionally stops. Also did 12 weeks of high dose vitamin D- similar with high dose and 2000 units -Patient reports aching in her toes on vitamin D in the past. If misses days feels better Last vitamin D: 21.09 November 2018 A/P: hopefully well controlled- continue current meds  -see above about osteoporosis  #hyperlipidemia/aortic atherosclerosis-not accounting for aortic atherosclerosis 10-year ASCVD risk above 15% in 2020 S: Brother with significant myalgias on statins and patient is concerned about trialing-she is more interested in fish oil or Vascepa.  Data not impressive for fish oil and she didn't tolerate.  -she has tried to increase salmon and minimize red meat intake. Has cut out her ice cream at night- uses 2% milk instead of whole milk.  Medication:rosuvastatin  On saturdays around time of her surgery would get migraine within 24 hours. Tracked barometric pressure and that wa snot issue   A/P: hopefully improved- statin intolerant due to migraines- update lipids. Rosuvastatin once a week caused migraines within 24 hours  - for aortic atherosclerosis- also advised quitting msoking- she is cutting back heavily  % Headaches-follows closely with Dr. Jannifer Franklin. fiorcet or sumatriptan prn. botox injections- seems to be well controlled. Phenergan for nausea portion   #Urgency and diarrhea at times. Probiotics helping some after working with Dr. Cristina Gong last year- rotisserie hicken may be trigger.  No blood in stool or melena.  Continued issues since 2014.  Is up-to-date on colonoscopy plus has seen GI  #RUQ pain- could have gallstone- intermittent issue- wants to hold off on ultrasound  Recommended follow up:  1 year physical  Future Appointments  Date Time Provider [place]  07/11/2019 11:00 AM Marin Olp, MD [facility]  07/18/2019 11:25 AM [facility] DEVICE REMOTES [facility]  08/22/2019 11:25 AM [facility] DEVICE REMOTES [facility]  08/24/2019  7:30 AM Kathrynn Ducking, MD [facility] None  09/26/2019 11:25 AM [facility] DEVICE REMOTES [facility]  10/24/2019  2:15 PM Thompson Grayer, MD [facility]  10/31/2019 11:25 AM [facility] DEVICE REMOTES [facility]  12/05/2019 11:25 AM [facility] DEVICE REMOTES [facility]  01/09/2020 11:25 AM [facility] DEVICE REMOTES [facility]  02/13/2020 11:25 AM [facility] DEVICE REMOTES [facility]  06/04/2020 11:15 AM Suzzanne Cloud, NP [facility] None   Lab/Order associations: fasting    ICD-10-CM   1. Preventative health care  Z00.00 CBC with Differential/Platelet    Comprehensive metabolic panel    Lipid panel    VITAMIN D 25 Hydroxy (Vit-D Deficiency, Fractures)  2. Depression, major, single episode, complete remission ([place])  F32.5   3. Low vitamin D level  R79.89 VITAMIN D 25 Hydroxy (Vit-D Deficiency, Fractures)  4. High cholesterol  E78.00 CBC with Differential/Platelet    Comprehensive metabolic panel    Lipid panel  5. Chronic obstructive pulmonary disease, unspecified COPD type ([place])  J44.9   6. Tobacco abuse  Z72.0   7. Paroxysmal atrial fibrillation [facility]) s/p ablation november 2018, 2011 previously  I48.0   8. Intractable chronic migraine without aura and without status migrainosus  G43.719   9. Glaucoma, unspecified glaucoma type, unspecified laterality  H40.9     No orders of the defined types were placed in this encounter.   Return precautions advised.  Garret Reddish, MD

## 2019-07-11 ENCOUNTER — Ambulatory Visit (INDEPENDENT_AMBULATORY_CARE_PROVIDER_SITE_OTHER): Payer: Medicare PPO | Admitting: Family Medicine

## 2019-07-11 ENCOUNTER — Encounter: Payer: Self-pay | Admitting: Family Medicine

## 2019-07-11 ENCOUNTER — Other Ambulatory Visit: Payer: Self-pay

## 2019-07-11 VITALS — BP 110/72 | HR 65 | Temp 98.4°F | Ht 67.0 in | Wt 149.5 lb

## 2019-07-11 DIAGNOSIS — J449 Chronic obstructive pulmonary disease, unspecified: Secondary | ICD-10-CM | POA: Diagnosis not present

## 2019-07-11 DIAGNOSIS — Z0000 Encounter for general adult medical examination without abnormal findings: Secondary | ICD-10-CM | POA: Diagnosis not present

## 2019-07-11 DIAGNOSIS — G43719 Chronic migraine without aura, intractable, without status migrainosus: Secondary | ICD-10-CM | POA: Diagnosis not present

## 2019-07-11 DIAGNOSIS — I480 Paroxysmal atrial fibrillation: Secondary | ICD-10-CM

## 2019-07-11 DIAGNOSIS — Z720 Tobacco use: Secondary | ICD-10-CM | POA: Diagnosis not present

## 2019-07-11 DIAGNOSIS — E7800 Pure hypercholesterolemia, unspecified: Secondary | ICD-10-CM

## 2019-07-11 DIAGNOSIS — H409 Unspecified glaucoma: Secondary | ICD-10-CM | POA: Diagnosis not present

## 2019-07-11 DIAGNOSIS — R7989 Other specified abnormal findings of blood chemistry: Secondary | ICD-10-CM

## 2019-07-11 DIAGNOSIS — F325 Major depressive disorder, single episode, in full remission: Secondary | ICD-10-CM | POA: Diagnosis not present

## 2019-07-11 LAB — POC URINALSYSI DIPSTICK (AUTOMATED)
Bilirubin, UA: NEGATIVE
Blood, UA: NEGATIVE
Glucose, UA: NEGATIVE
Ketones, UA: NEGATIVE
Leukocytes, UA: NEGATIVE
Nitrite, UA: NEGATIVE
Protein, UA: NEGATIVE
Spec Grav, UA: 1.02 (ref 1.010–1.025)
Urobilinogen, UA: 0.2 E.U./dL
pH, UA: 6 (ref 5.0–8.0)

## 2019-07-11 LAB — CBC WITH DIFFERENTIAL/PLATELET
Basophils Absolute: 0 10*3/uL (ref 0.0–0.1)
Basophils Relative: 0.5 % (ref 0.0–3.0)
Eosinophils Absolute: 0.1 10*3/uL (ref 0.0–0.7)
Eosinophils Relative: 1.4 % (ref 0.0–5.0)
HCT: 39.9 % (ref 36.0–46.0)
Hemoglobin: 13.4 g/dL (ref 12.0–15.0)
Lymphocytes Relative: 26.6 % (ref 12.0–46.0)
Lymphs Abs: 1.8 10*3/uL (ref 0.7–4.0)
MCHC: 33.5 g/dL (ref 30.0–36.0)
MCV: 97.6 fl (ref 78.0–100.0)
Monocytes Absolute: 0.5 10*3/uL (ref 0.1–1.0)
Monocytes Relative: 7.7 % (ref 3.0–12.0)
Neutro Abs: 4.3 10*3/uL (ref 1.4–7.7)
Neutrophils Relative %: 63.8 % (ref 43.0–77.0)
Platelets: 171 10*3/uL (ref 150.0–400.0)
RBC: 4.08 Mil/uL (ref 3.87–5.11)
RDW: 13.6 % (ref 11.5–15.5)
WBC: 6.7 10*3/uL (ref 4.0–10.5)

## 2019-07-11 LAB — COMPREHENSIVE METABOLIC PANEL
ALT: 10 U/L (ref 0–35)
AST: 14 U/L (ref 0–37)
Albumin: 4.4 g/dL (ref 3.5–5.2)
Alkaline Phosphatase: 93 U/L (ref 39–117)
BUN: 13 mg/dL (ref 6–23)
CO2: 29 mEq/L (ref 19–32)
Calcium: 9.6 mg/dL (ref 8.4–10.5)
Chloride: 103 mEq/L (ref 96–112)
Creatinine, Ser: 0.71 mg/dL (ref 0.40–1.20)
GFR: 81.22 mL/min (ref >60.00)
Glucose, Bld: 93 mg/dL (ref 70–99)
Potassium: 4.4 mEq/L (ref 3.5–5.1)
Sodium: 136 mEq/L (ref 135–145)
Total Bilirubin: 0.4 mg/dL (ref 0.2–1.2)
Total Protein: 6.2 g/dL (ref 6.0–8.3)

## 2019-07-11 LAB — LIPID PANEL
Cholesterol: 252 mg/dL — ABNORMAL HIGH (ref 0–200)
HDL: 61.7 mg/dL (ref >39.00)
LDL Cholesterol: 163 mg/dL — ABNORMAL HIGH (ref 0–99)
NonHDL: 189.91
Total CHOL/HDL Ratio: 4
Triglycerides: 137 mg/dL (ref 0.0–149.0)
VLDL: 27.4 mg/dL (ref 0.0–40.0)

## 2019-07-11 LAB — VITAMIN D 25 HYDROXY (VIT D DEFICIENCY, FRACTURES): VITD: 43.92 ng/mL (ref 30.00–100.00)

## 2019-07-11 NOTE — Addendum Note (Signed)
Addended by: Betti Cruz on: 07/11/2019 01:53 PM   Modules accepted: Orders

## 2019-07-18 ENCOUNTER — Encounter: Payer: Medicare Other | Admitting: Family Medicine

## 2019-07-18 ENCOUNTER — Ambulatory Visit (INDEPENDENT_AMBULATORY_CARE_PROVIDER_SITE_OTHER): Payer: Medicare PPO | Admitting: *Deleted

## 2019-07-18 DIAGNOSIS — I480 Paroxysmal atrial fibrillation: Secondary | ICD-10-CM

## 2019-07-18 LAB — CUP PACEART REMOTE DEVICE CHECK
Date Time Interrogation Session: 20210613232837
Implantable Pulse Generator Implant Date: 20180830

## 2019-07-20 NOTE — Progress Notes (Signed)
Carelink Summary Report / Loop Recorder 

## 2019-07-21 DIAGNOSIS — Z01419 Encounter for gynecological examination (general) (routine) without abnormal findings: Secondary | ICD-10-CM | POA: Diagnosis not present

## 2019-07-22 DIAGNOSIS — G4733 Obstructive sleep apnea (adult) (pediatric): Secondary | ICD-10-CM | POA: Diagnosis not present

## 2019-07-26 MED FILL — ALPRAZolam 0.5 MG TABS: 0.5 | 30 days supply | Qty: 60 | Fill #0

## 2019-08-01 MED FILL — BUTALB-ACETAMIN-CAFF 50-325: 50-325-40 | 7 days supply | Qty: 30 | Fill #2

## 2019-08-22 ENCOUNTER — Ambulatory Visit (INDEPENDENT_AMBULATORY_CARE_PROVIDER_SITE_OTHER): Payer: Medicare PPO | Admitting: *Deleted

## 2019-08-22 DIAGNOSIS — I480 Paroxysmal atrial fibrillation: Secondary | ICD-10-CM | POA: Diagnosis not present

## 2019-08-22 LAB — CUP PACEART REMOTE DEVICE CHECK
Date Time Interrogation Session: 20210718230545
Implantable Pulse Generator Implant Date: 20180830

## 2019-08-23 NOTE — Progress Notes (Signed)
Carelink Summary Report / Loop Recorder 

## 2019-08-24 ENCOUNTER — Ambulatory Visit: Payer: Medicare PPO | Admitting: Neurology

## 2019-08-24 ENCOUNTER — Other Ambulatory Visit: Payer: Self-pay

## 2019-08-24 ENCOUNTER — Encounter: Payer: Self-pay | Admitting: Neurology

## 2019-08-24 ENCOUNTER — Other Ambulatory Visit: Payer: Self-pay | Admitting: Neurology

## 2019-08-24 VITALS — BP 114/65 | HR 62 | Ht 67.0 in | Wt 152.0 lb

## 2019-08-24 DIAGNOSIS — G43719 Chronic migraine without aura, intractable, without status migrainosus: Secondary | ICD-10-CM | POA: Diagnosis not present

## 2019-08-24 MED ORDER — SUMATRIPTAN SUCCINATE 100 MG PO TABS: 100.0000 mg | ORAL_TABLET | ORAL | 1 refills | Status: DC | PRN

## 2019-08-24 MED ORDER — ONABOTULINUMTOXINA 100 UNITS IJ SOLR
200.0000 [IU] | Freq: Once | INTRAMUSCULAR | Status: AC
  Administered 2019-08-24: 200 [IU] via INTRAMUSCULAR

## 2019-08-24 MED ORDER — BUTALBITAL-APAP-CAFFEINE 50-325-40 MG PO TABS: 1.0000 | ORAL_TABLET | Freq: Four times a day (QID) | ORAL | 1 refills | Status: DC | PRN

## 2019-08-24 MED FILL — SUMATRIPTAN SUCC 100 MG TAB: 100 | 90 days supply | Qty: 27 | Fill #0

## 2019-08-24 MED FILL — BUTALB-ACETAMIN-CAFF 50-325: 50-325-40 | 23 days supply | Qty: 90 | Fill #0

## 2019-08-24 NOTE — Progress Notes (Signed)
The patient comes in today for a Botox injection.  She indicates that the Botox appears to have worn off, it is no longer effective for her headaches.  The patient is not getting as good of a response from the generic Imitrex, she wishes to go to brand name Imitrex.  A prescription was sent in for this.  The patient was given a 90-day supply of the Fioricet.  In the future we may consider Aimovig or Ajovy, the patient however suffers from severe chronic constipation, she will be seeing her gastroenterologist soon.  She takes daily Colace and MiraLAX.

## 2019-08-24 NOTE — Procedures (Signed)
     BOTOX PROCEDURE NOTE FOR MIGRAINE HEADACHE   HISTORY: Suzanne [name] is a 69 year old patient with a history of intractable migraine headache.  More recently, she has noted that the Botox injections have not been very effective.  She is having daily headaches of some sort but her migraine severe headaches have become much more frequent.  Imitrex is not as effective as it was for treating her headaches.   Description of procedure:  The patient was placed in a sitting position. The standard protocol was used for Botox as follows, with 5 units of Botox injected at each site:   -Procerus muscle, midline injection  -Corrugator muscle, bilateral injection  -Frontalis muscle, bilateral injection, with 2 sites each side, medial injection was performed in the upper one third of the frontalis muscle, in the region vertical from the medial inferior edge of the superior orbital rim. The lateral injection was again in the upper one third of the forehead vertically above the lateral limbus of the cornea, 1.5 cm lateral to the medial injection site.  -Temporalis muscle injection, 4 sites, bilaterally. The first injection was 3 cm above the tragus of the ear, second injection site was 1.5 cm to 3 cm up from the first injection site in line with the tragus of the ear. The third injection site was 1.5-3 cm forward between the first 2 injection sites. The fourth injection site was 1.5 cm posterior to the second injection site.  -Occipitalis muscle injection, 3 sites, bilaterally. The first injection was done one half way between the occipital protuberance and the tip of the mastoid process behind the ear. The second injection site was done lateral and superior to the first, 1 fingerbreadth from the first injection. The third injection site was 1 fingerbreadth superiorly and medially from the first injection site.  -Cervical paraspinal muscle injection, 2 sites, bilateral, the first injection site was 1 cm  from the midline of the cervical spine, 3 cm inferior to the lower border of the occipital protuberance. The second injection site was 1.5 cm superiorly and laterally to the first injection site.  -Trapezius muscle injection was performed at 3 sites, bilaterally. The first injection site was in the upper trapezius muscle halfway between the inflection point of the neck, and the acromion. The second injection site was one half way between the acromion and the first injection site. The third injection was done between the first injection site and the inflection point of the neck.   A 200 unit bottle of Botox was used, 155 units were injected, the rest of the Botox was wasted. The patient tolerated the procedure well, there were no complications of the above procedure.  Botox NDC 1275-1700-17 Lot number [id number] Expiration date April 2024

## 2019-09-07 MED ORDER — IMITREX 100 MG PO TABS
ORAL_TABLET | ORAL | 2 refills | Status: DC
Start: 2019-09-07 — End: 2020-06-04

## 2019-09-14 MED FILL — DORZOLAMIDE HCL-TIMOLOL MAL: 22.3-6.8 | 90 days supply | Qty: 20 | Fill #1

## 2019-09-14 MED FILL — buPROPion HCL ER (XL) 300 M: 300 | 90 days supply | Qty: 90 | Fill #1

## 2019-09-14 MED FILL — SERTRALINE HCL 100 MG TABS: 100 | 90 days supply | Qty: 90 | Fill #1

## 2019-09-14 MED FILL — ALPRAZolam 0.5 MG TABS: 0.5 | 30 days supply | Qty: 60 | Fill #1

## 2019-09-16 ENCOUNTER — Other Ambulatory Visit: Payer: Self-pay | Admitting: Family Medicine

## 2019-09-16 DIAGNOSIS — Z1231 Encounter for screening mammogram for malignant neoplasm of breast: Secondary | ICD-10-CM

## 2019-09-24 LAB — CUP PACEART REMOTE DEVICE CHECK
Date Time Interrogation Session: 20210818232725
Implantable Pulse Generator Implant Date: 20180830

## 2019-09-26 ENCOUNTER — Ambulatory Visit (INDEPENDENT_AMBULATORY_CARE_PROVIDER_SITE_OTHER): Payer: Medicare PPO | Admitting: *Deleted

## 2019-09-26 DIAGNOSIS — I480 Paroxysmal atrial fibrillation: Secondary | ICD-10-CM | POA: Diagnosis not present

## 2019-09-27 DIAGNOSIS — M7672 Peroneal tendinitis, left leg: Secondary | ICD-10-CM | POA: Diagnosis not present

## 2019-09-28 ENCOUNTER — Other Ambulatory Visit: Payer: Self-pay

## 2019-09-28 ENCOUNTER — Ambulatory Visit
Admission: RE | Admit: 2019-09-28 | Discharge: 2019-09-28 | Disposition: A | Discharge status: Home / Self Care | Payer: Medicare PPO | Source: Ambulatory Visit

## 2019-09-28 DIAGNOSIS — Z1231 Encounter for screening mammogram for malignant neoplasm of breast: Secondary | ICD-10-CM

## 2019-10-03 NOTE — Progress Notes (Signed)
Carelink Summary Report / Loop Recorder 

## 2019-10-06 ENCOUNTER — Encounter: Payer: Self-pay | Admitting: Family Medicine

## 2019-10-06 DIAGNOSIS — Z23 Encounter for immunization: Secondary | ICD-10-CM | POA: Diagnosis not present

## 2019-10-11 MED FILL — ALBUTEROL SULFATE HFA 108 (: 108 (90 BAS | 25 days supply | Qty: 9 | Fill #1

## 2019-10-11 MED FILL — ELIQUIS 5 MG TABLET: 5 | 90 days supply | Qty: 180 | Fill #1

## 2019-10-12 DIAGNOSIS — H401231 Low-tension glaucoma, bilateral, mild stage: Secondary | ICD-10-CM | POA: Diagnosis not present

## 2019-10-20 ENCOUNTER — Encounter: Payer: Self-pay | Admitting: Family Medicine

## 2019-10-20 ENCOUNTER — Telehealth: Payer: Self-pay | Admitting: Family Medicine

## 2019-10-20 ENCOUNTER — Ambulatory Visit: Payer: Medicare PPO | Admitting: Family Medicine

## 2019-10-20 ENCOUNTER — Other Ambulatory Visit: Payer: Self-pay

## 2019-10-20 VITALS — BP 110/70 | HR 64 | Temp 98.1°F | Wt 153.8 lb

## 2019-10-20 DIAGNOSIS — R1011 Right upper quadrant pain: Secondary | ICD-10-CM | POA: Diagnosis not present

## 2019-10-20 DIAGNOSIS — R109 Unspecified abdominal pain: Secondary | ICD-10-CM | POA: Diagnosis not present

## 2019-10-20 DIAGNOSIS — K580 Irritable bowel syndrome with diarrhea: Secondary | ICD-10-CM

## 2019-10-20 MED ORDER — HYOSCYAMINE SULFATE SL 0.125 MG SL SUBL: 0.1250 mg | SUBLINGUAL_TABLET | Freq: Three times a day (TID) | SUBLINGUAL | 2 refills | Status: DC

## 2019-10-20 MED FILL — OSCIMIN 0.125 MG SUBL: 0.125 | 20 days supply | Qty: 60 | Fill #0

## 2019-10-20 NOTE — Telephone Encounter (Signed)
Initial Comment The pt has right abd pain. Translation No Nurse Assessment Nurse: Sumner Boast, RN, [place] Derry Date/Time Eilene Ghazi Time): 10/19/2019 10:20:19 AM Confirm and document reason for call. If symptomatic, describe symptoms. ---Caller states she has RUQ abdominal pain. Pain started last night and has been constant. Pain is 2-3 on pain scale of 10. No vomiting. Has had intermittent diarrhea after meals for over a year. No fever. Has the patient had close contact with a person known or suspected to have the novel coronavirus illness OR traveled / lives in area with major community spread (including international travel) in the last 14 days from the onset of symptoms? * If Asymptomatic, screen for exposure and travel within the last 14 days. ---No Does the patient have any new or worsening symptoms? ---Yes Will a triage be completed? ---Yes Related visit to physician within the last 2 weeks? ---Yes Does the PT have any chronic conditions? (i.e. diabetes, asthma, this includes High risk factors for pregnancy, etc.) ---Yes List chronic conditions. ---A Fib with 5 Ablations Migraines Anxiety Is this a behavioral health or substance abuse call? ---No Guidelines Guideline Title Affirmed Question Affirmed Notes Nurse Date/Time (Eastern Time) Abdominal Pain - Female [1] MILD-MODERATE pain AND [2] constant AND [3] present > 2 hours Sumner Boast, Valentino Nose 10/19/2019 10:50:48 AMPLEASE NOTE: All timestamps contained within this report are represented as [place] Standard Time. CONFIDENTIALTY NOTICE: This fax transmission is intended only for the addressee. It contains information that is legally privileged, confidential or otherwise protected from use or disclosure. If you are not the intended recipient, you are strictly prohibited from reviewing, disclosing, copying using or disseminating any of this information or taking any action in reliance on or regarding this information. If you have received  this fax in error, please notify us immediately by telephone so that we can arrange for its return to [place]. Phone: [phone], Toll-Free: [phone], Fax: [phone] Page: 2 of 2 Call Id: [id number] [place]. Time Eilene Ghazi Time) Disposition Final User 10/19/2019 10:24:22 AM Attempt made - message left Janace Litten 10/19/2019 10:54:57 AM See HCP within 4 Hours (or PCP triage) Yes Sumner Boast, RN, Teola Bradley Disagree/Comply Comply Caller Understands Yes PreDisposition Call Doctor Care Advice Given Per Guideline SEE HCP (OR PCP TRIAGE) WITHIN 4 HOURS: CALL BACK IF: * You become worse CARE ADVICE given per Abdominal Pain, Female (Adult) guideline. Comments User: Lorra Hals, RN Date/Time Eilene Ghazi Time): 10/19/2019 10:56:16 AM Referred to front office to schedule appt. Referrals REFERRED TO PCP OFFIC

## 2019-10-20 NOTE — Patient Instructions (Signed)
Please follow up if symptoms do not improve or as needed.   I will release your lab results to you on your MyChart account with further instructions. Please reply with any questions.   We will call you to get you scheduled for your RUQ ultrasound.   Eat bland, keep hydrated and try the antispasmodic medication as needed.    Abdominal Pain, Adult Pain in the abdomen (abdominal pain) can be caused by many things. Often, abdominal pain is not serious and it gets better with no treatment or by being treated at home. However, sometimes abdominal pain is serious. Your health care provider will ask questions about your medical history and do a physical exam to try to determine the cause of your abdominal pain. Follow these instructions at home:  Medicines  Take over-the-counter and prescription medicines only as told by your health care provider.  Do not take a laxative unless told by your health care provider. General instructions  Watch your condition for any changes.  Drink enough fluid to keep your urine pale yellow.  Keep all follow-up visits as told by your health care provider. This is important. Contact a health care provider if:  Your abdominal pain changes or gets worse.  You are not hungry or you lose weight without trying.  You are constipated or have diarrhea for more than 2-3 days.  You have pain when you urinate or have a bowel movement.  Your abdominal pain wakes you up at night.  Your pain gets worse with meals, after eating, or with certain foods.  You are vomiting and cannot keep anything down.  You have a fever.  You have blood in your urine. Get help right away if:  Your pain does not go away as soon as your health care provider told you to expect.  You cannot stop vomiting.  Your pain is only in areas of the abdomen, such as the right side or the left lower portion of the abdomen. Pain on the right side could be caused by appendicitis.  You have  bloody or black stools, or stools that look like tar.  You have severe pain, cramping, or bloating in your abdomen.  You have signs of dehydration, such as: ? Dark urine, very little urine, or no urine. ? Cracked lips. ? Dry mouth. ? Sunken eyes. ? Sleepiness. ? Weakness.  You have trouble breathing or chest pain. Summary  Often, abdominal pain is not serious and it gets better with no treatment or by being treated at home. However, sometimes abdominal pain is serious.  Watch your condition for any changes.  Take over-the-counter and prescription medicines only as told by your health care provider.  Contact a health care provider if your abdominal pain changes or gets worse.  Get help right away if you have severe pain, cramping, or bloating in your abdomen. This information is not intended to replace advice given to you by your health care provider. Make sure you discuss any questions you have with your health care provider. Document Revised: 05/31/2018 Document Reviewed: 05/31/2018 Elsevier Patient Education  [place].

## 2019-10-20 NOTE — Telephone Encounter (Signed)
Perfect thanks

## 2019-10-20 NOTE — Progress Notes (Signed)
Subjective  CC:  Chief Complaint  Patient presents with  . Pain    left and right UQ, dull aching pain   Same day acute visit; PCP not available. New pt to me. Chart reviewed.   HPI: Suzanne [name] is a 69 y.o. female who presents to the office today to address the problems listed above in the chief complaint.  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  For the last year or so she has had intermittent left upper quadrant and right upper quadrant pain that was mostly attributed to her IBS.  She has had work-up for IBS with Dr. Cristina Gong.  She used probiotics and this helped.  However over the last several months she has had increasing frequency of right upper quadrant pain associated with meals.  Over the last week or 2 is happened 2 or 3 times.  Pain is dull right underneath the right rib cage.  She has not required medication for it but is not taking retention anti-inflammatories.  She has had minimal nausea but no vomiting.  She continues to have postprandial diarrhea with mucus but no blood.  This is normal for her.  She admits that over the weekend she is vacationing and her diet was more rich than usual.  She has had no fevers, chills, hematemesis nor melena.  She has not had an abdominal ultrasound.  She reports a normal colonoscopy in 2015.  She has never had diverticular disease to her knowledge.  She has had an abdominal hysterectomy but no other abdominal surgeries.  Her appetite is normal.  Assessment  1. RUQ abdominal pain   2. Left sided abdominal pain   3. Irritable bowel syndrome with diarrhea      Plan   Right upper quadrant and left-sided abdominal pain in a patient with IBS: Due to progressing symptoms and tenderness on exam, start evaluation for right upper quadrant pain.  Check lab work.  Right upper quadrant ultrasound ordered.  Keep bland diet.  Keep hydrated.  Tylenol as needed  for pain.  Patient knows red flag symptoms and how to get further care if needed.  Further instructions will be given after results return.  Normal bowel syndrome with diarrhea: Start antispasmodics and monitor.  If cannot get under control and work-up is negative, she will follow-up with Dr. Cristina Gong for further care.  Follow up: Return if symptoms worsen or fail to improve.  Visit date not found  Orders Placed This Encounter  Procedures  . US Abdomen Limited RUQ  . COMPLETE METABOLIC PANEL WITH GFR  . Lipase  . Amylase  . CBC with Differential/Platelet   Meds ordered this encounter  Medications  . Hyoscyamine Sulfate SL (LEVSIN/SL) 0.125 MG SUBL    Sig: Place 0.125 mg under the tongue 3 (three) times daily before meals. As needed for bloating and cramping    Dispense:  60 tablet    Refill:  2      I reviewed the patients updated PMH, FH, and SocHx.    Patient Active Problem List   Diagnosis Date Noted  . Anxiety state 12/14/2018  . Glaucoma 01/08/2018  . Primary localized osteoarthritis of left knee 01/08/2018  . Low vitamin D level 01/08/2018  . High cholesterol   . Paroxysmal atrial fibrillation [facility]) s/p ablation november 2018, 2011 previously 12/16/2016  . Nasal injury 10/20/2016  . Intractable chronic migraine without aura 05/17/2014  . Tobacco  abuse 10/03/2013  . S/P ablation of atrial fibrillation 05/16/2013  . Depression, major, single episode, complete remission ([place]) 02/01/2009  . Asthma 02/01/2009  . COPD (chronic obstructive pulmonary disease) ([place]) 02/01/2009   Current Meds  Medication Sig  . acetaminophen (TYLENOL) 325 MG tablet Take 325 mg 2 (two) times daily as needed by mouth (pain).   Marland Kitchen albuterol (VENTOLIN HFA) 108 (90 Base) MCG/ACT inhaler INHALE 2 PUFFS INTO THE LUNGS EVERY 6 HOURS AS NEEDED FOR WHEEZING OR SHORTNESS OF BREATH.  Marland Kitchen ALPRAZolam (XANAX) 0.5 MG tablet Take 0.5 mg at bedtime as needed by mouth for anxiety or sleep. Anxiety or sleep  .  BOTOX 200 units SOLR INJECT 200 UNITS  INTRAMUSCULARLY EVERY 3  MONTHS (GIVEN AT MD OFFICE, DISCARD UNUSED PORTION  AFTER FIRST USE)  . buPROPion (WELLBUTRIN XL) 300 MG 24 hr tablet Take 300 mg daily by mouth.   . butalbital-acetaminophen-caffeine (FIORICET) 50-325-40 MG tablet Take 1 tablet by mouth every 6 (six) hours as needed for headache.  . dorzolamide-timolol (COSOPT) 22.3-6.8 MG/ML ophthalmic solution Place 1 drop 2 (two) times daily into both eyes.   Marland Kitchen ELIQUIS 5 MG TABS tablet TAKE 1 TABLET BY MOUTH TWICE DAILY  . Hypromellose (ARTIFICIAL TEARS OP) Place 1 drop into both eyes daily as needed (dry eyes).   . IMITREX 100 MG tablet TAKE 1 TABLET BY MOUTH AT THE ONSET OF A HEADACHE DAILY AS NEEDED *Brand is medically necessary.*  . Probiotic Product (ALIGN PO) Take 4 mg by mouth daily.   . promethazine (PHENERGAN) 25 MG tablet Take 1 tablet (25 mg total) by mouth every 6 (six) hours as needed for nausea or vomiting.  . sertraline (ZOLOFT) 100 MG tablet Take 100 mg by mouth daily with breakfast.   . Simethicone (GAS-X PO) Take 2 tablets daily as needed by mouth (gas).  . sodium chloride (OCEAN) 0.65 % SOLN nasal spray Place 1 spray as needed into both nostrils for congestion.    Allergies: Patient is allergic to dilaudid [hydromorphone hcl], fentanyl, ciprofloxacin, erythromycin, and percocet [oxycodone-acetaminophen]. Family History: Patient family history includes Cancer in her mother; Dementia in her father; Heart disease in her father; Hyperlipidemia in her brother; Migraines in her mother; Other in her brother. Social History:  Patient  reports that she has been smoking cigarettes. She has a 37.50 pack-year smoking history. She has never used smokeless tobacco. She reports current alcohol use. She reports that she does not use drugs.  Review of Systems: Constitutional: Negative for fever malaise or anorexia Cardiovascular: negative for chest pain Respiratory: negative for SOB or  persistent cough Gastrointestinal: negative for abdominal pain  Objective  Vitals: BP 110/70   Pulse 64   Temp 98.1 F (36.7 C) (Temporal)   Wt 153 lb 12.8 oz (69.8 kg)   SpO2 95%   BMI 24.09 kg/m  General: no acute distress , A&Ox3, she appears well HEENT: PEERL, conjunctiva normal, neck is supple Cardiovascular:  RRR without murmur or gallop.  Respiratory:  Good breath sounds bilaterally, CTAB with normal respiratory effort Gastrointestinal: soft, flat abdomen, normal active bowel sounds, right upper quadrant tenderness without rebound or guarding, left upper quadrant tenderness without rebound or guarding no lower abdominal tenderness present, no palpable masses, no hepatosplenomegaly, no appreciated hernias Skin:  Warm, no rashes     Commons side effects, risks, benefits, and alternatives for medications and treatment plan prescribed today were discussed, and the patient expressed understanding of the given instructions. Patient is instructed to call  or message via MyChart if he/she has any questions or concerns regarding our treatment plan. No barriers to understanding were identified. We discussed Red Flag symptoms and signs in detail. Patient expressed understanding regarding what to do in case of urgent or emergency type symptoms.   Medication list was reconciled, printed and provided to the patient in AVS. Patient instructions and summary information was reviewed with the patient as documented in the AVS. This note was prepared with assistance of Dragon voice recognition software. Occasional wrong-word or sound-a-like substitutions may have occurred due to the inherent limitations of voice recognition software  This visit occurred during the SARS-CoV-2 public health emergency.  Safety protocols were in place, including screening questions prior to the visit, additional usage of staff PPE, and extensive cleaning of exam room while observing appropriate contact time as indicated for  disinfecting solutions.

## 2019-10-21 ENCOUNTER — Telehealth: Payer: Self-pay

## 2019-10-21 LAB — COMPLETE METABOLIC PANEL WITH GFR
AG Ratio: 2.2 (calc) (ref 1.0–2.5)
ALT: 10 U/L (ref 6–29)
AST: 11 U/L (ref 10–35)
Albumin: 4.4 g/dL (ref 3.6–5.1)
Alkaline phosphatase (APISO): 84 U/L (ref 37–153)
BUN: 14 mg/dL (ref 7–25)
CO2: 29 mmol/L (ref 20–32)
Calcium: 9.7 mg/dL (ref 8.6–10.4)
Chloride: 102 mmol/L (ref 98–110)
Creat: 0.75 mg/dL (ref 0.60–0.93)
GFR, Est African American: 94 mL/min/{1.73_m2} (ref >=60)
GFR, Est Non African American: 81 mL/min/{1.73_m2} (ref >=60)
Globulin: 2 g/dL (calc) (ref 1.9–3.7)
Glucose, Bld: 85 mg/dL (ref 65–99)
Potassium: 4.4 mmol/L (ref 3.5–5.3)
Sodium: 138 mmol/L (ref 135–146)
Total Bilirubin: 0.5 mg/dL (ref 0.2–1.2)
Total Protein: 6.4 g/dL (ref 6.1–8.1)

## 2019-10-21 LAB — CBC WITH DIFFERENTIAL/PLATELET
Absolute Monocytes: 459 cells/uL (ref 200–950)
Basophils Absolute: 28 cells/uL (ref 0–200)
Basophils Relative: 0.5 %
Eosinophils Absolute: 112 cells/uL (ref 15–500)
Eosinophils Relative: 2 %
HCT: 40.1 % (ref 35.0–45.0)
Hemoglobin: 13.5 g/dL (ref 11.7–15.5)
Lymphs Abs: 1473 cells/uL (ref 850–3900)
MCH: 32 pg (ref 27.0–33.0)
MCHC: 33.7 g/dL (ref 32.0–36.0)
MCV: 95 fL (ref 80.0–100.0)
MPV: 10.3 fL (ref 7.5–12.5)
Monocytes Relative: 8.2 %
Neutro Abs: 3528 cells/uL (ref 1500–7800)
Neutrophils Relative %: 63 %
Platelets: 196 10*3/uL (ref 140–400)
RBC: 4.22 10*6/uL (ref 3.80–5.10)
RDW: 12.6 % (ref 11.0–15.0)
Total Lymphocyte: 26.3 %
WBC: 5.6 10*3/uL (ref 3.8–10.8)

## 2019-10-21 LAB — LIPASE: Lipase: 19 U/L (ref 7–60)

## 2019-10-21 LAB — AMYLASE: Amylase: 30 U/L (ref 21–101)

## 2019-10-21 NOTE — Telephone Encounter (Signed)
Left a message regarding virtual appt on 10/24/19.

## 2019-10-24 ENCOUNTER — Telehealth (INDEPENDENT_AMBULATORY_CARE_PROVIDER_SITE_OTHER): Payer: Medicare PPO | Admitting: Internal Medicine

## 2019-10-24 ENCOUNTER — Other Ambulatory Visit: Payer: Self-pay

## 2019-10-24 DIAGNOSIS — G4733 Obstructive sleep apnea (adult) (pediatric): Secondary | ICD-10-CM

## 2019-10-24 DIAGNOSIS — D6869 Other thrombophilia: Secondary | ICD-10-CM | POA: Diagnosis not present

## 2019-10-24 DIAGNOSIS — I480 Paroxysmal atrial fibrillation: Secondary | ICD-10-CM | POA: Diagnosis not present

## 2019-10-24 NOTE — Progress Notes (Signed)
Electrophysiology TeleHealth Note   Due to national recommendations of social distancing due to COVID 19, an audio/video telehealth visit is felt to be most appropriate for this patient at this time.  See MyChart message from today for the patient's consent to telehealth for [facility].  Date:  10/24/2019   ID:  Suzanne [name], DOB 01-23-1951, MRN [id number]  Location: patient's home  Provider location:  [place]  Evaluation Performed: Follow-up visit  PCP:  Marin Olp, MD   Electrophysiologist:  Dr Rayann Heman  Chief Complaint:  palpitations  History of Present Illness:    Suzanne [name] is a 68 y.o. female who presents via telehealth conferencing today.  Since last being seen in our clinic, the patient reports doing very well.  Today, she denies symptoms of palpitations, chest pain, shortness of breath,  lower extremity edema, dizziness, presyncope, or syncope.  The patient is otherwise without complaint today.   Past Medical History:  Diagnosis Date  . AICD (automatic cardioverter/defibrillator) present    loop recorder  . Anxiety   . Anxiety state 12/14/2018  . Asthma   . Atrial fibrillation ([place])    s/p ablations  . Atrial flutter ([place])   . COPD (chronic obstructive pulmonary disease) ([place])   . Degenerative arthritis    "maybe in my left knee" (12/16/2016)  . Depression   . Dysrhythmia 2010   A-fib  . Frozen shoulder    hx bilaterally; "had to go thru PT; no problem w/them since I learned exercises" (12/16/2016)  . GERD (gastroesophageal reflux disease)   . Glaucoma   . H/O: rheumatic fever 1957  . High cholesterol   . Migraine    "getting botox now; now only 2-3/month" (12/16/2016)  . PONV (postoperative nausea and vomiting)    with Fentanyl,  Dilaudid &  Propofol (12/16/2016)    Past Surgical History:  Procedure Laterality Date  . ABDOMINAL HYSTERECTOMY  1990   right ovary removed  . APPENDECTOMY  1990  . ATRIAL FIBRILLATION  ABLATION N/A 12/16/2016   Procedure: ATRIAL FIBRILLATION ABLATION;  Surgeon: Thompson Grayer, MD;  Location: [facility];  Service: Cardiovascular;  Laterality: N/A;  . CATARACT EXTRACTION, BILATERAL    . CTI ablation  3/11  . ESOPHAGOGASTRODUODENOSCOPY (EGD) WITH PROPOFOL N/A 01/06/2013   Procedure: ESOPHAGOGASTRODUODENOSCOPY (EGD) WITH PROPOFOL;  Surgeon: Cleotis Nipper, MD;  Location: [facility];  Service: Endoscopy;  Laterality: N/A;  . KNEE ARTHROSCOPY Bilateral 1999-2006  . KNEE SURGERY Bilateral [id number]   "open; for congenital deformity"  . LOOP RECORDER INSERTION N/A 10/02/2016   Procedure: LOOP RECORDER INSERTION;  Surgeon: Thompson Grayer, MD;  Location: [facility];  Service: Cardiovascular;  Laterality: N/A;  . PERIPHERAL VASCULAR INTERVENTION  2/11  & 6/11  . RHINOPLASTY  1965  . TONSILLECTOMY  as child  . TOOTH EXTRACTION  2009   2 teeth extractted  . TOTAL KNEE ARTHROPLASTY Left 12/27/2018   Procedure: TOTAL KNEE ARTHROPLASTY;  Surgeon: Elsie Saas, MD;  Location: [facility];  Service: Orthopedics;  Laterality: Left;    Current Outpatient Medications  Medication Sig Dispense Refill  . acetaminophen (TYLENOL) 325 MG tablet Take 325 mg 2 (two) times daily as needed by mouth (pain).     Marland Kitchen albuterol (VENTOLIN HFA) 108 (90 Base) MCG/ACT inhaler INHALE 2 PUFFS INTO THE LUNGS EVERY 6 HOURS AS NEEDED FOR WHEEZING OR SHORTNESS OF BREATH. 8.5 g 2  . ALPRAZolam (XANAX) 0.5 MG tablet Take 0.5 mg  at bedtime as needed by mouth for anxiety or sleep. Anxiety or sleep    . BOTOX 200 units SOLR INJECT 200 UNITS  INTRAMUSCULARLY EVERY 3  MONTHS (GIVEN AT MD OFFICE, DISCARD UNUSED PORTION  AFTER FIRST USE) 1 each 3  . buPROPion (WELLBUTRIN XL) 300 MG 24 hr tablet Take 300 mg daily by mouth.     . butalbital-acetaminophen-caffeine (FIORICET) 50-325-40 MG tablet Take 1 tablet by mouth every 6 (six) hours as needed for headache. 90 tablet 1  . dorzolamide-timolol (COSOPT) 22.3-6.8  MG/ML ophthalmic solution Place 1 drop 2 (two) times daily into both eyes.   3  . ELIQUIS 5 MG TABS tablet TAKE 1 TABLET BY MOUTH TWICE DAILY 180 tablet 1  . Hyoscyamine Sulfate SL (LEVSIN/SL) 0.125 MG SUBL Place 0.125 mg under the tongue 3 (three) times daily before meals. As needed for bloating and cramping 60 tablet 2  . Hypromellose (ARTIFICIAL TEARS OP) Place 1 drop into both eyes daily as needed (dry eyes).     . IMITREX 100 MG tablet TAKE 1 TABLET BY MOUTH AT THE ONSET OF A HEADACHE DAILY AS NEEDED *Brand is medically necessary.* 27 tablet 2  . Probiotic Product (ALIGN PO) Take 4 mg by mouth daily.     . promethazine (PHENERGAN) 25 MG tablet Take 1 tablet (25 mg total) by mouth every 6 (six) hours as needed for nausea or vomiting. 20 tablet 1  . sertraline (ZOLOFT) 100 MG tablet Take 100 mg by mouth daily with breakfast.     . Simethicone (GAS-X PO) Take 2 tablets daily as needed by mouth (gas).    . sodium chloride (OCEAN) 0.65 % SOLN nasal spray Place 1 spray as needed into both nostrils for congestion.     No current facility-administered medications for this visit.    Allergies:   Dilaudid [hydromorphone hcl], Fentanyl, Ciprofloxacin, Erythromycin, and Percocet [oxycodone-acetaminophen]   Social History:  The patient  reports that she has been smoking cigarettes. She has a 37.50 pack-year smoking history. She has never used smokeless tobacco. She reports current alcohol use. She reports that she does not use drugs.   ROS:  Please see the history of present illness.   All other systems are personally reviewed and negative.    Exam:    Vital Signs:  There were no vitals taken for this visit.  Well sounding and appearing, alert and conversant, regular work of breathing,  good skin color Eyes- anicteric, neuro- grossly intact, skin- no apparent rash or lesions or cyanosis, mouth- oral mucosa is pink  Labs/Other Tests and Data Reviewed:    Recent Labs: 10/20/2019: ALT 10; BUN 14;  Creat 0.75; Hemoglobin 13.5; Platelets 196; Potassium 4.4; Sodium 138   Wt Readings from Last 3 Encounters:  10/20/19 153 lb 12.8 oz (69.8 kg)  08/24/19 152 lb (68.9 kg)  07/11/19 149 lb 8 oz (67.8 kg)     Last device remote is reviewed from [facility] PDF which reveals normal device function, no arrhythmias   ASSESSMENT & PLAN:    1.  Paroxysmal atrial fibrillation/ atrial flutter Well controlled post ablation off AAD therapy chads2vasc score is 2.  ILR reveals no afib. We discussed updated 2019 guidelines which suggests that she could stop [place] therapy. After careful consideration, she would prefer to stay on eliquis for now  2. HL I have reviewed her lipids at length with her today.  She does not feel that she can tolerate statin therapy.  3. OSA Follows with  Dr Maxwell Caul  Risks, benefits and potential toxicities for medications prescribed and/or refilled reviewed with patient today.   Follow-up:  12 months with me   Patient Risk:  after full review of this patients clinical status, I feel that they are at moderate risk at this time.  Today, I have spent 15 minutes with the patient with telehealth technology discussing arrhythmia management .    Army Fossa, MD  10/24/2019 2:24 PM     [facility] [address] [place] [address] [phone] (office) [phone] (fax)

## 2019-10-25 DIAGNOSIS — M7672 Peroneal tendinitis, left leg: Secondary | ICD-10-CM | POA: Diagnosis not present

## 2019-10-26 ENCOUNTER — Ambulatory Visit
Admission: RE | Admit: 2019-10-26 | Discharge: 2019-10-26 | Disposition: A | Discharge status: Home / Self Care | Payer: Medicare PPO | Source: Ambulatory Visit | Attending: Family Medicine | Admitting: Family Medicine

## 2019-10-26 ENCOUNTER — Other Ambulatory Visit: Payer: Self-pay

## 2019-10-26 DIAGNOSIS — R1011 Right upper quadrant pain: Secondary | ICD-10-CM

## 2019-10-26 DIAGNOSIS — K580 Irritable bowel syndrome with diarrhea: Secondary | ICD-10-CM

## 2019-10-26 DIAGNOSIS — R109 Unspecified abdominal pain: Secondary | ICD-10-CM

## 2019-10-30 LAB — CUP PACEART REMOTE DEVICE CHECK
Date Time Interrogation Session: 20210918232226
Implantable Pulse Generator Implant Date: 20180830

## 2019-10-31 ENCOUNTER — Ambulatory Visit (INDEPENDENT_AMBULATORY_CARE_PROVIDER_SITE_OTHER): Payer: Medicare PPO | Admitting: Emergency Medicine

## 2019-10-31 DIAGNOSIS — I480 Paroxysmal atrial fibrillation: Secondary | ICD-10-CM

## 2019-11-03 NOTE — Progress Notes (Signed)
Carelink Summary Report / Loop Recorder 

## 2019-11-04 ENCOUNTER — Encounter: Payer: Self-pay | Admitting: Family Medicine

## 2019-11-08 MED FILL — ALPRAZolam 0.5 MG TABS: 0.5 | 30 days supply | Qty: 60 | Fill #0

## 2019-11-16 MED FILL — ALBUTEROL SULFATE HFA 108 (: 108 (90 BAS | 25 days supply | Qty: 9 | Fill #2

## 2019-11-16 MED FILL — BUTALB-ACETAMIN-CAFF 50-325: 50-325-40 | 23 days supply | Qty: 90 | Fill #1

## 2019-11-22 ENCOUNTER — Other Ambulatory Visit (HOSPITAL_COMMUNITY): Payer: Self-pay | Admitting: Nurse Practitioner

## 2019-11-22 DIAGNOSIS — F331 Major depressive disorder, recurrent, moderate: Secondary | ICD-10-CM | POA: Diagnosis not present

## 2019-11-22 DIAGNOSIS — F411 Generalized anxiety disorder: Secondary | ICD-10-CM | POA: Diagnosis not present

## 2019-11-23 ENCOUNTER — Ambulatory Visit (INDEPENDENT_AMBULATORY_CARE_PROVIDER_SITE_OTHER): Payer: Medicare PPO

## 2019-11-23 DIAGNOSIS — I480 Paroxysmal atrial fibrillation: Secondary | ICD-10-CM | POA: Diagnosis not present

## 2019-11-23 LAB — CUP PACEART REMOTE DEVICE CHECK
Date Time Interrogation Session: 20211019233245
Implantable Pulse Generator Implant Date: 20180830

## 2019-11-28 ENCOUNTER — Ambulatory Visit: Payer: Medicare PPO | Admitting: Neurology

## 2019-11-29 NOTE — Progress Notes (Signed)
Carelink Summary Report / Loop Recorder 

## 2019-12-04 DIAGNOSIS — G4733 Obstructive sleep apnea (adult) (pediatric): Secondary | ICD-10-CM | POA: Diagnosis not present

## 2019-12-08 MED FILL — DORZOLAMIDE HCL-TIMOLOL MAL: 22.3-6.8 | 90 days supply | Qty: 20 | Fill #2

## 2019-12-08 MED FILL — SERTRALINE HCL 100 MG TABS: 100 | 90 days supply | Qty: 90 | Fill #0

## 2019-12-08 MED FILL — buPROPion HCL ER (XL) 300 M: 300 | 90 days supply | Qty: 90 | Fill #0

## 2019-12-12 ENCOUNTER — Other Ambulatory Visit: Payer: Self-pay

## 2019-12-12 ENCOUNTER — Encounter: Payer: Self-pay | Admitting: Neurology

## 2019-12-12 ENCOUNTER — Ambulatory Visit: Payer: Medicare PPO | Admitting: Neurology

## 2019-12-12 VITALS — BP 134/71 | HR 61 | Ht 67.0 in | Wt 152.5 lb

## 2019-12-12 DIAGNOSIS — G43719 Chronic migraine without aura, intractable, without status migrainosus: Secondary | ICD-10-CM | POA: Diagnosis not present

## 2019-12-12 MED ORDER — ONABOTULINUMTOXINA 100 UNITS IJ SOLR
100.0000 [IU] | Freq: Once | INTRAMUSCULAR | Status: AC
  Administered 2019-12-12: 100 [IU] via INTRAMUSCULAR

## 2019-12-12 MED ORDER — ONABOTULINUMTOXINA 100 UNITS IJ SOLR
200.0000 [IU] | Freq: Once | INTRAMUSCULAR | Status: AC
  Administered 2020-06-25: 200 [IU] via INTRAMUSCULAR

## 2019-12-12 NOTE — Progress Notes (Signed)
Please refer to Botox procedure note. 

## 2019-12-12 NOTE — Procedures (Signed)
     BOTOX PROCEDURE NOTE FOR MIGRAINE HEADACHE   HISTORY: Suzanne [name] is a 69 year old patient with a history of intractable migraine headache.  The patient comes in for another Botox injection, she did well with the last injection, headaches have worsened over the last 2 and half weeks.  She still gets good benefit with Imitrex.   Description of procedure:  The patient was placed in a sitting position. The standard protocol was used for Botox as follows, with 5 units of Botox injected at each site:   -Procerus muscle, midline injection  -Corrugator muscle, bilateral injection  -Frontalis muscle, bilateral injection, with 2 sites each side, medial injection was performed in the upper one third of the frontalis muscle, in the region vertical from the medial inferior edge of the superior orbital rim. The lateral injection was again in the upper one third of the forehead vertically above the lateral limbus of the cornea, 1.5 cm lateral to the medial injection site.  -Temporalis muscle injection, 4 sites, bilaterally. The first injection was 3 cm above the tragus of the ear, second injection site was 1.5 cm to 3 cm up from the first injection site in line with the tragus of the ear. The third injection site was 1.5-3 cm forward between the first 2 injection sites. The fourth injection site was 1.5 cm posterior to the second injection site.  -Occipitalis muscle injection, 3 sites, bilaterally. The first injection was done one half way between the occipital protuberance and the tip of the mastoid process behind the ear. The second injection site was done lateral and superior to the first, 1 fingerbreadth from the first injection. The third injection site was 1 fingerbreadth superiorly and medially from the first injection site.  -Cervical paraspinal muscle injection, 2 sites, bilateral, the first injection site was 1 cm from the midline of the cervical spine, 3 cm inferior to the lower border  of the occipital protuberance. The second injection site was 1.5 cm superiorly and laterally to the first injection site.  -Trapezius muscle injection was performed at 3 sites, bilaterally. The first injection site was in the upper trapezius muscle halfway between the inflection point of the neck, and the acromion. The second injection site was one half way between the acromion and the first injection site. The third injection was done between the first injection site and the inflection point of the neck.   A 200 unit bottle of Botox was used, 155 units were injected, the rest of the Botox was wasted. The patient tolerated the procedure well, there were no complications of the above procedure.  Botox NDC 8032-1224-82 Lot number [id number]  Expiration date June 2024

## 2019-12-26 ENCOUNTER — Ambulatory Visit (INDEPENDENT_AMBULATORY_CARE_PROVIDER_SITE_OTHER): Payer: Medicare PPO

## 2019-12-26 DIAGNOSIS — I480 Paroxysmal atrial fibrillation: Secondary | ICD-10-CM | POA: Diagnosis not present

## 2019-12-26 LAB — CUP PACEART REMOTE DEVICE CHECK
Date Time Interrogation Session: 20211119223609
Implantable Pulse Generator Implant Date: 20180830

## 2020-01-02 MED FILL — ALPRAZolam 0.5 MG TABS: 0.5 | 30 days supply | Qty: 60 | Fill #1

## 2020-01-02 NOTE — Progress Notes (Signed)
Carelink Summary Report / Loop Recorder 

## 2020-01-03 DIAGNOSIS — M7672 Peroneal tendinitis, left leg: Secondary | ICD-10-CM | POA: Diagnosis not present

## 2020-01-09 DIAGNOSIS — R200 Anesthesia of skin: Secondary | ICD-10-CM | POA: Diagnosis not present

## 2020-01-09 DIAGNOSIS — M25572 Pain in left ankle and joints of left foot: Secondary | ICD-10-CM | POA: Diagnosis not present

## 2020-01-11 ENCOUNTER — Other Ambulatory Visit: Payer: Self-pay | Admitting: Internal Medicine

## 2020-01-11 MED FILL — ELIQUIS 5 MG TABLET: 5 | 90 days supply | Qty: 180 | Fill #0

## 2020-01-11 NOTE — Telephone Encounter (Signed)
Eliquis 5mg  refill request received. Patient is 69 years old, weight-69.2kg, Crea-0.75 on 10/20/2019, Diagnosis-Afib, and last seen by Dr. Rayann Heman via Telemedicine Visit on 10/24/2019. Dose is appropriate based on dosing criteria. Will send in refill to requested pharmacy.

## 2020-01-24 ENCOUNTER — Ambulatory Visit (INDEPENDENT_AMBULATORY_CARE_PROVIDER_SITE_OTHER): Payer: Medicare PPO

## 2020-01-24 DIAGNOSIS — I480 Paroxysmal atrial fibrillation: Secondary | ICD-10-CM

## 2020-01-24 LAB — CUP PACEART REMOTE DEVICE CHECK
Date Time Interrogation Session: 20211220223355
Implantable Pulse Generator Implant Date: 20180830

## 2020-02-01 ENCOUNTER — Other Ambulatory Visit: Payer: Self-pay | Admitting: Family Medicine

## 2020-02-01 MED FILL — ALBUTEROL SULFATE HFA 108 (: 108 (90 BAS | 25 days supply | Qty: 18 | Fill #0

## 2020-02-07 NOTE — Progress Notes (Signed)
Carelink Summary Report / Loop Recorder 

## 2020-02-14 ENCOUNTER — Ambulatory Visit (INDEPENDENT_AMBULATORY_CARE_PROVIDER_SITE_OTHER)
Admission: RE | Admit: 2020-02-14 | Discharge: 2020-02-14 | Disposition: A | Discharge status: Home / Self Care | Payer: Medicare PPO | Source: Ambulatory Visit | Attending: Family Medicine | Admitting: Family Medicine

## 2020-02-14 ENCOUNTER — Other Ambulatory Visit: Payer: Self-pay

## 2020-02-14 DIAGNOSIS — Z87891 Personal history of nicotine dependence: Secondary | ICD-10-CM | POA: Diagnosis not present

## 2020-02-14 DIAGNOSIS — F17210 Nicotine dependence, cigarettes, uncomplicated: Secondary | ICD-10-CM

## 2020-02-15 MED FILL — ALPRAZolam 0.5 MG TABS: 0.5 | 30 days supply | Qty: 60 | Fill #0

## 2020-02-16 NOTE — Progress Notes (Signed)
Please call patient and let them  know their  low dose Ct was read as a  Lung RADS 2: nodules that are benign in appearance and behavior with a very low likelihood of becoming a clinically active cancer due to size or lack of growth. Recommendation per radiology is for a repeat LDCT in 12 months. Please let them  know we will order and schedule their  annual screening scan for 02/2021. Please let them  know there was notation of CAD on their  scan.  Please remind the patient  that this is a non-gated exam therefore degree or severity of disease  cannot be determined. Please have them  follow up with their PCP regarding potential risk factor modification, dietary therapy or pharmacologic therapy if clinically indicated. Pt.  is not  currently on statin therapy. Please place order for annual  screening scan for  02/2021 and fax results to PCP. Thanks so much.  Pt. Is followed by cards. Please make sure she knows to shared these results with her cardiologist. ( Aortic Stenosis). Last echo was 2015. Thanks [employer]

## 2020-02-21 DIAGNOSIS — F411 Generalized anxiety disorder: Secondary | ICD-10-CM | POA: Diagnosis not present

## 2020-02-21 DIAGNOSIS — F331 Major depressive disorder, recurrent, moderate: Secondary | ICD-10-CM | POA: Diagnosis not present

## 2020-02-22 ENCOUNTER — Other Ambulatory Visit (HOSPITAL_COMMUNITY): Payer: Self-pay | Admitting: Nurse Practitioner

## 2020-02-24 ENCOUNTER — Ambulatory Visit (INDEPENDENT_AMBULATORY_CARE_PROVIDER_SITE_OTHER): Payer: Medicare PPO

## 2020-02-24 DIAGNOSIS — I480 Paroxysmal atrial fibrillation: Secondary | ICD-10-CM | POA: Diagnosis not present

## 2020-02-24 LAB — CUP PACEART REMOTE DEVICE CHECK
Date Time Interrogation Session: 20220120223815
Implantable Pulse Generator Implant Date: 20180830

## 2020-03-01 ENCOUNTER — Other Ambulatory Visit: Payer: Self-pay | Admitting: *Deleted

## 2020-03-01 ENCOUNTER — Other Ambulatory Visit: Payer: Self-pay | Admitting: Neurology

## 2020-03-01 DIAGNOSIS — F17210 Nicotine dependence, cigarettes, uncomplicated: Secondary | ICD-10-CM

## 2020-03-01 DIAGNOSIS — Z87891 Personal history of nicotine dependence: Secondary | ICD-10-CM

## 2020-03-01 MED FILL — SUMATRIPTAN SUCCINATE 100 M: 100 | 90 days supply | Qty: 27 | Fill #1

## 2020-03-05 ENCOUNTER — Other Ambulatory Visit: Payer: Self-pay | Admitting: Neurology

## 2020-03-05 MED FILL — BUTALB-ACETAMIN-CAFF 50-325: 50-325-40 | 23 days supply | Qty: 90 | Fill #0

## 2020-03-07 ENCOUNTER — Telehealth: Payer: Self-pay | Admitting: Neurology

## 2020-03-07 NOTE — Telephone Encounter (Signed)
Patient has a Botox appointment 2/14. Her PA with [employer] expired 02/03/20. I filled out new PA and gave to MD to sign.

## 2020-03-07 NOTE — Progress Notes (Signed)
Carelink Summary Report / Loop Recorder 

## 2020-03-08 NOTE — Telephone Encounter (Signed)
Received signed PA form. Faxed to [facility] with notes.

## 2020-03-12 NOTE — Telephone Encounter (Signed)
Received approval via fax from [facility] [place] #[id number] (02/22/19- 02/02/21).

## 2020-03-19 ENCOUNTER — Ambulatory Visit: Payer: Medicare PPO | Admitting: Neurology

## 2020-03-19 ENCOUNTER — Encounter: Payer: Self-pay | Admitting: Neurology

## 2020-03-19 VITALS — BP 140/65 | HR 74 | Ht 67.0 in | Wt 152.6 lb

## 2020-03-19 DIAGNOSIS — G43719 Chronic migraine without aura, intractable, without status migrainosus: Secondary | ICD-10-CM | POA: Diagnosis not present

## 2020-03-19 MED ORDER — ONABOTULINUMTOXINA 100 UNITS IJ SOLR
200.0000 [IU] | Freq: Once | INTRAMUSCULAR | Status: AC
  Administered 2020-03-19: 200 [IU] via INTRAMUSCULAR

## 2020-03-19 NOTE — Addendum Note (Signed)
Addended by: Kathrynn Ducking on: 03/19/2020 12:52 PM   Modules accepted: Orders

## 2020-03-19 NOTE — Progress Notes (Signed)
Please refer to Botox procedure note. 

## 2020-03-19 NOTE — Progress Notes (Signed)
Botox- 200 units x 1 vial Lot: [id number] Expiration: 11/2022 NDC: 1914-7829-56  Bacteriostatic 0.9% Sodium Chloride- 48mL total Lot: [id number] Expiration: 02/23 NDC: 7846-9629-52 B/B

## 2020-03-19 NOTE — Procedures (Signed)
     BOTOX PROCEDURE NOTE FOR MIGRAINE HEADACHE   HISTORY: Suzanne [name] is a 70 year old patient with history of intractable migraine.  She has gained benefit with the Botox, she tends to have increased frequency of headaches within 2 weeks prior to the next injection.  She comes in for another Botox therapy.   Description of procedure:  The patient was placed in a sitting position. The standard protocol was used for Botox as follows, with 5 units of Botox injected at each site:   -Procerus muscle, midline injection  -Corrugator muscle, bilateral injection  -Frontalis muscle, bilateral injection, with 2 sites each side, medial injection was performed in the upper one third of the frontalis muscle, in the region vertical from the medial inferior edge of the superior orbital rim. The lateral injection was again in the upper one third of the forehead vertically above the lateral limbus of the cornea, 1.5 cm lateral to the medial injection site.  -Temporalis muscle injection, 4 sites, bilaterally. The first injection was 3 cm above the tragus of the ear, second injection site was 1.5 cm to 3 cm up from the first injection site in line with the tragus of the ear. The third injection site was 1.5-3 cm forward between the first 2 injection sites. The fourth injection site was 1.5 cm posterior to the second injection site.  -Occipitalis muscle injection, 3 sites, bilaterally. The first injection was done one half way between the occipital protuberance and the tip of the mastoid process behind the ear. The second injection site was done lateral and superior to the first, 1 fingerbreadth from the first injection. The third injection site was 1 fingerbreadth superiorly and medially from the first injection site.  -Cervical paraspinal muscle injection, 2 sites, bilateral, the first injection site was 1 cm from the midline of the cervical spine, 3 cm inferior to the lower border of the occipital  protuberance. The second injection site was 1.5 cm superiorly and laterally to the first injection site.  -Trapezius muscle injection was performed at 3 sites, bilaterally. The first injection site was in the upper trapezius muscle halfway between the inflection point of the neck, and the acromion. The second injection site was one half way between the acromion and the first injection site. The third injection was done between the first injection site and the inflection point of the neck.   A 200 unit bottle of Botox was used, 155 units were injected, the rest of the Botox was wasted. The patient tolerated the procedure well, there were no complications of the above procedure.  Botox NDC 1694-5038-88 Lot number [id number] Expiration date October 2024

## 2020-03-26 ENCOUNTER — Ambulatory Visit (INDEPENDENT_AMBULATORY_CARE_PROVIDER_SITE_OTHER): Payer: Medicare PPO

## 2020-03-26 DIAGNOSIS — I480 Paroxysmal atrial fibrillation: Secondary | ICD-10-CM

## 2020-03-27 ENCOUNTER — Encounter: Payer: Medicare PPO | Admitting: Family Medicine

## 2020-03-27 LAB — CUP PACEART REMOTE DEVICE CHECK
Date Time Interrogation Session: 20220220224318
Implantable Pulse Generator Implant Date: 20180830

## 2020-03-29 NOTE — Progress Notes (Signed)
Carelink Summary Report / Loop Recorder 

## 2020-04-04 MED FILL — ALPRAZolam 0.5 MG TABS: 0.5 | 30 days supply | Qty: 60 | Fill #1

## 2020-04-04 MED FILL — ELIQUIS 5 MG TABLET: 5 | 90 days supply | Qty: 180 | Fill #1

## 2020-04-09 DIAGNOSIS — H401231 Low-tension glaucoma, bilateral, mild stage: Secondary | ICD-10-CM | POA: Diagnosis not present

## 2020-04-09 DIAGNOSIS — H02401 Unspecified ptosis of right eyelid: Secondary | ICD-10-CM | POA: Diagnosis not present

## 2020-04-09 DIAGNOSIS — Z961 Presence of intraocular lens: Secondary | ICD-10-CM | POA: Diagnosis not present

## 2020-04-19 ENCOUNTER — Telehealth: Payer: Self-pay | Admitting: Family Medicine

## 2020-04-19 NOTE — Telephone Encounter (Signed)
Left message for patient to call back and schedule Medicare Annual Wellness Visit (AWV) either virtually or in office. No detailed message   Last AWV 03/25/19 please schedule at anytime    This should be a 45 minute visit.

## 2020-04-26 ENCOUNTER — Ambulatory Visit (INDEPENDENT_AMBULATORY_CARE_PROVIDER_SITE_OTHER): Payer: Medicare PPO

## 2020-04-26 DIAGNOSIS — I495 Sick sinus syndrome: Secondary | ICD-10-CM | POA: Diagnosis not present

## 2020-04-26 LAB — CUP PACEART REMOTE DEVICE CHECK
Date Time Interrogation Session: 20220323234404
Implantable Pulse Generator Implant Date: 20180830

## 2020-04-27 ENCOUNTER — Ambulatory Visit (INDEPENDENT_AMBULATORY_CARE_PROVIDER_SITE_OTHER): Payer: Medicare PPO

## 2020-04-27 VITALS — BP 100/70 | HR 77 | Temp 98.2°F | Wt 148.8 lb

## 2020-04-27 DIAGNOSIS — Z0000 Encounter for general adult medical examination without abnormal findings: Secondary | ICD-10-CM | POA: Diagnosis not present

## 2020-04-27 NOTE — Progress Notes (Signed)
Subjective:   Suzanne [name] is a 70 y.o. female who presents for Medicare Annual (Subsequent) preventive examination.  Review of Systems     Cardiac Risk Factors include: advanced age (>27men, >75 women);smoking/ tobacco exposure     Objective:    Today's Vitals   04/27/20 1306  BP: 100/70  Pulse: 77  Temp: 98.2 F (36.8 C)  SpO2: 95%  Weight: 148 lb 12.8 oz (67.5 kg)   Body mass index is 23.31 kg/m.  Advanced Directives 04/27/2020 03/25/2019 12/27/2018 12/17/2018 12/16/2016 12/16/2016 10/13/2016  Does Patient Have a Medical Advance Directive? Yes Yes Yes Yes Yes Yes Yes  Type of Advance Directive Healthcare Power of Attorney Living will;Healthcare Power of [place];Living will [place];Living will [place];Living will [place];Living will [place];Living will  Does patient want to make changes to medical advance directive? - No - Patient declined No - Patient declined - No - Patient declined No - Patient declined -  Copy of [place] in Chart? No - copy requested No - copy requested No - copy requested - No - copy requested - -    Current Medications (verified) Outpatient Encounter Medications as of 04/27/2020  Medication Sig  . acetaminophen (TYLENOL) 325 MG tablet Take 325 mg 2 (two) times daily as needed by mouth (pain).   Marland Kitchen albuterol (VENTOLIN HFA) 108 (90 Base) MCG/ACT inhaler INHALE 2 PUFFS INTO THE LUNGS EVERY 6 HOURS AS NEEDED FOR WHEEZING OR SHORTNESS OF BREATH.  Marland Kitchen ALPRAZolam (XANAX) 0.5 MG tablet Take 0.5 mg at bedtime as needed by mouth for anxiety or sleep. Anxiety or sleep  . BOTOX 200 units SOLR INJECT 200 UNITS  INTRAMUSCULARLY EVERY 3  MONTHS (GIVEN AT MD OFFICE, DISCARD UNUSED PORTION  AFTER FIRST USE)  . buPROPion (WELLBUTRIN XL) 300 MG 24 hr tablet Take 300 mg by mouth daily.  . butalbital-acetaminophen-caffeine (FIORICET)  50-325-40 MG tablet TAKE 1 TABLET BY MOUTH EVERY 6 HOURS AS NEEDED FOR HEADACHE.  . Diclofenac Sodium 1 % CREA as needed.  . dorzolamide-timolol (COSOPT) 22.3-6.8 MG/ML ophthalmic solution Place 1 drop 2 (two) times daily into both eyes.   Marland Kitchen ELIQUIS 5 MG TABS tablet TAKE 1 TABLET BY MOUTH TWICE DAILY  . Hypromellose (ARTIFICIAL TEARS OP) Place 1 drop into both eyes daily as needed (dry eyes).   . IMITREX 100 MG tablet TAKE 1 TABLET BY MOUTH AT THE ONSET OF A HEADACHE DAILY AS NEEDED *Brand is medically necessary.*  . promethazine (PHENERGAN) 25 MG tablet Take 1 tablet (25 mg total) by mouth every 6 (six) hours as needed for nausea or vomiting.  . sertraline (ZOLOFT) 100 MG tablet Take 100 mg by mouth daily with breakfast.  . Simethicone (GAS-X PO) Take 2 tablets daily as needed by mouth (gas).  . sodium chloride (OCEAN) 0.65 % SOLN nasal spray Place 1 spray as needed into both nostrils for congestion.  . Probiotic Product (ALIGN PO) Take 4 mg by mouth daily.  (Patient not taking: Reported on 04/27/2020)  . [DISCONTINUED] Hyoscyamine Sulfate SL (LEVSIN/SL) 0.125 MG SUBL Place 0.125 mg under the tongue 3 (three) times daily before meals. As needed for bloating and cramping   Facility-Administered Encounter Medications as of 04/27/2020  Medication  . botulinum toxin Type A (BOTOX) injection 200 Units    Allergies (verified) Dilaudid [hydromorphone hcl], Fentanyl, Ciprofloxacin, Erythromycin, and Percocet [oxycodone-acetaminophen]   History: Past Medical History:  Diagnosis  Date  . AICD (automatic cardioverter/defibrillator) present    loop recorder  . Anxiety   . Anxiety state 12/14/2018  . Asthma   . Atrial fibrillation ([place])    s/p ablations  . Atrial flutter ([place])   . COPD (chronic obstructive pulmonary disease) ([place])   . Degenerative arthritis    "maybe in my left knee" (12/16/2016)  . Depression   . Dysrhythmia 2010   A-fib  . Frozen shoulder    hx bilaterally; "had to go thru  PT; no problem w/them since I learned exercises" (12/16/2016)  . GERD (gastroesophageal reflux disease)   . Glaucoma   . H/O: rheumatic fever 1957  . High cholesterol   . Migraine    "getting botox now; now only 2-3/month" (12/16/2016)  . PONV (postoperative nausea and vomiting)    with Fentanyl,  Dilaudid &  Propofol (12/16/2016)   Past Surgical History:  Procedure Laterality Date  . ABDOMINAL HYSTERECTOMY  1990   right ovary removed  . APPENDECTOMY  1990  . ATRIAL FIBRILLATION ABLATION N/A 12/16/2016   Procedure: ATRIAL FIBRILLATION ABLATION;  Surgeon: Thompson Grayer, MD;  Location: [facility];  Service: Cardiovascular;  Laterality: N/A;  . CATARACT EXTRACTION, BILATERAL    . CTI ablation  3/11  . ESOPHAGOGASTRODUODENOSCOPY (EGD) WITH PROPOFOL N/A 01/06/2013   Procedure: ESOPHAGOGASTRODUODENOSCOPY (EGD) WITH PROPOFOL;  Surgeon: Cleotis Nipper, MD;  Location: [facility];  Service: Endoscopy;  Laterality: N/A;  . KNEE ARTHROSCOPY Bilateral 1999-2006  . KNEE SURGERY Bilateral [id number]   "open; for congenital deformity"  . LOOP RECORDER INSERTION N/A 10/02/2016   Procedure: LOOP RECORDER INSERTION;  Surgeon: Thompson Grayer, MD;  Location: [facility];  Service: Cardiovascular;  Laterality: N/A;  . PERIPHERAL VASCULAR INTERVENTION  2/11  & 6/11  . RHINOPLASTY  1965  . TONSILLECTOMY  as child  . TOOTH EXTRACTION  2009   2 teeth extractted  . TOTAL KNEE ARTHROPLASTY Left 12/27/2018   Procedure: TOTAL KNEE ARTHROPLASTY;  Surgeon: Elsie Saas, MD;  Location: [facility];  Service: Orthopedics;  Laterality: Left;   Family History  Problem Relation Age of Onset  . Cancer Mother        renal cell  . Migraines Mother   . Dementia Father   . Heart disease Father        mid 37s  . Other Brother        died 17- out for run- no autopsy  . Hyperlipidemia Brother   . Breast cancer Neg Hx    Social History   Socioeconomic History  . Marital status: Married    Spouse  name: Not on file  . Number of children: 0  . Years of education: 91  . Highest education level: Not on file  Occupational History  . 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 Occupation: CLINICAL NURSE CARE    Employer: [employer]  Tobacco Use  . Smoking status: Current Every Day Smoker    Packs/day: 0.25    Years: 50.00    Pack years: 12.50    Types: Cigarettes  . Smokeless tobacco: Never Used  . Tobacco comment: Discussed reduce to quit, gave card with resources to help in the community  Vaping Use  . Vaping Use: Never used  Substance and Sexual Activity  . Alcohol use: Yes    Comment: 12/16/2016 "1-2 glasses of wine per year"  . Drug use: No  .  Sexual activity: Not Currently  Other Topics Concern  . Not on file  Social History Narrative   Lives at home w/ husband and 1 dog   Patient is right handed.   Patient drinks one cup caffeine.   Social Determinants of Health   Financial Resource Strain: Low Risk   . Difficulty of Paying Living Expenses: Not hard at all  Food Insecurity: No Food Insecurity  . Worried About Charity fundraiser in the Last Year: Never true  . Ran Out of Food in the Last Year: Never true  Transportation Needs: No Transportation Needs  . Lack of Transportation (Medical): No  . Lack of Transportation (Non-Medical): No  Physical Activity: Inactive  . Days of Exercise per Week: 0 days  . Minutes of Exercise per Session: 0 min  Stress: No Stress Concern Present  . Feeling of Stress : Only a little  Social Connections: Socially Integrated  . Frequency of Communication with Friends and Family: More than three times a week  . Frequency of Social Gatherings with Friends and Family: Twice a week  . Attends Religious Services: 1 to 4 times per year  . Active Member of Clubs or Organizations: Yes  . Attends Archivist Meetings: 1 to 4 times per year  . Marital Status: Married    Tobacco Counseling Ready  to quit: Not Answered Counseling given: Not Answered Comment: Discussed reduce to quit, gave card with resources to help in the community   Clinical Intake:  Pre-visit preparation completed: Yes  Pain : No/denies pain     BMI - recorded: 23.31 Nutritional Status: BMI of 19-24  Normal Nutritional Risks: None Diabetes: No  How often do you need to have someone help you when you read instructions, pamphlets, or other written materials from your doctor or pharmacy?: 1 - Never  Diabetic?No  Interpreter Needed?: No  Information entered by :: Charlott Rakes, LPN   Activities of Daily Living In your present state of health, do you have any difficulty performing the following activities: 04/27/2020  Hearing? N  Vision? N  Walking or climbing stairs? Y  Comment slower  Dressing or bathing? N  Doing errands, shopping? N  Preparing Food and eating ? N  Using the Toilet? N  In the past six months, have you accidently leaked urine? N  Do you have problems with loss of bowel control? N  Managing your Medications? N  Managing your Finances? N  Housekeeping or managing your Housekeeping? N  Some recent data might be hidden    Patient Care Team: Marin Olp, MD as PCP - General (Family Medicine) Thompson Grayer, MD as PCP - Cardiology (Cardiology) Kathrynn Ducking, MD as Consulting Physician (Neurology) Marius Ditch, MD as Consulting Physician (Sleep Medicine) Katy Apo, MD as Consulting Physician (Ophthalmology) Harriett Sine, MD as Consulting Physician (Dermatology)  Indicate any recent Medical Services you may have received from other than [facility] providers in the past year (date may be approximate).     Assessment:   This is a routine wellness examination for [place].  Hearing/Vision screen  Hearing Screening   125Hz  250Hz  500Hz  1000Hz  2000Hz  3000Hz  4000Hz  6000Hz  8000Hz   Right ear:           Left ear:           Comments: Pt denies any hearing issues    Vision Screening Comments: Pt follows up with Dr Prudencio Burly for annual eye exams  Dietary issues and exercise activities discussed:  Current Exercise Habits: The patient does not participate in regular exercise at present  Goals    . Patient Stated     Stay mobile and active       Depression Screen PHQ 2/9 Scores 04/27/2020 07/11/2019 03/25/2019 11/05/2018 07/07/2018 01/08/2018  PHQ - 2 Score 1 0 2 2 1 1   PHQ- 9 Score - 3 - 5 3 4     Fall Risk Fall Risk  04/27/2020 03/25/2019 11/05/2018 01/08/2018 12/26/2014  Falls in the past year? 0 1 0 0 No  Number falls in past yr: 0 0 0 - -  Injury with Fall? 0 0 0 - -  Risk for fall due to : Impaired vision;Impaired mobility Orthopedic patient - - -  Follow up Falls prevention discussed Education provided;Falls evaluation completed;Falls prevention discussed - - -    FALL RISK PREVENTION PERTAINING TO THE HOME:  Any stairs in or around the home? Yes  If so, are there any without handrails? No  Home free of loose throw rugs in walkways, pet beds, electrical cords, etc? Yes  Adequate lighting in your home to reduce risk of falls? Yes   ASSISTIVE DEVICES UTILIZED TO PREVENT FALLS:  Life alert? No  Use of a cane, walker or w/c? No  Grab bars in the bathroom? Yes  Shower chair or bench in shower? No  Elevated toilet seat or a handicapped toilet? No   TIMED UP AND GO:  Was the test performed? No .  Length of time to ambulate 10 feet: 10 sec.   Gait steady and fast without use of assistive device  Cognitive Function:     6CIT Screen 04/27/2020 03/25/2019  What Year? 0 points 0 points  What month? 0 points 0 points  What time? - 0 points  Count back from 20 0 points 0 points  Months in reverse 0 points 0 points  Repeat phrase 0 points 0 points  Total Score - 0    Immunizations Immunization History  Administered Date(s) Administered  . Influenza, High Dose Seasonal PF 11/08/2013, 10/10/2016, 09/24/2018, 10/06/2019  . Influenza,inj,quad,  With Preservative 11/07/2014  . Influenza-Unspecified 10/12/2016, 10/29/2017, 10/06/2019  . PFIZER(Purple Top)SARS-COV-2 Vaccination 02/27/2019, 03/21/2019, 11/04/2019  . Pneumococcal Conjugate-13 03/27/2016  . Pneumococcal Polysaccharide-23 12/20/2013  . Tdap 02/02/2013    TDAP status: Up to date  Flu Vaccine status: Up to date  Pneumococcal vaccine status: Up to date  Covid-19 vaccine status: Completed vaccines  Qualifies for Shingles Vaccine? Yes   Zostavax completed No   Shingrix Completed?: No.    Education has been provided regarding the importance of this vaccine. Patient has been advised to call insurance company to determine out of pocket expense if they have not yet received this vaccine. Advised may also receive vaccine at local pharmacy or Health Dept. Verbalized acceptance and understanding.  Screening Tests Health Maintenance  Topic Date Due  . MAMMOGRAM  09/27/2021  . TETANUS/TDAP  02/03/2023  . COLONOSCOPY (Pts 45-19yrs Insurance coverage will need to be confirmed)  05/24/2023  . INFLUENZA VACCINE  Completed  . DEXA SCAN  Completed  . COVID-19 Vaccine  Completed  . Hepatitis C Screening  Completed  . PNA vac Low Risk Adult  Completed  . HPV VACCINES  Aged Out    Health Maintenance  There are no preventive care reminders to display for this patient.  Colorectal cancer screening: Type of screening: Colonoscopy. Completed 05/23/13. Repeat every 10 years  Mammogram status: Completed 09/28/19. Repeat every year  Bone  Density status: Completed 09/28/18. Results reflect: Bone density results: OSTEOPOROSIS. Repeat every 2 years.   Additional Screening:  Hepatitis C Screening:  Completed 07/06/18  Vision Screening: Recommended annual ophthalmology exams for early detection of glaucoma and other disorders of the eye. Is the patient up to date with their annual eye exam?  Yes  Who is the provider or what is the name of the office in which the patient attends annual  eye exams? Dr Prudencio Burly  If pt is not established with a provider, would they like to be referred to a provider to establish care? No .   Dental Screening: Recommended annual dental exams for proper oral hygiene  Community Resource Referral / Chronic Care Management: CRR required this visit?  No   CCM required this visit?  No      Plan:     I have personally reviewed and noted the following in the patient's chart:   . Medical and social history . Use of alcohol, tobacco or illicit drugs  . Current medications and supplements . Functional ability and status . Nutritional status . Physical activity . Advanced directives . List of other physicians . Hospitalizations, surgeries, and ER visits in previous 12 months . Vitals . Screenings to include cognitive, depression, and falls . Referrals and appointments  In addition, I have reviewed and discussed with patient certain preventive protocols, quality metrics, and best practice recommendations. A written personalized care plan for preventive services as well as general preventive health recommendations were provided to patient.     Willette Brace, LPN   [id number]   Nurse Notes: None

## 2020-04-27 NOTE — Patient Instructions (Addendum)
Suzanne [name] , Thank you for taking time to come for your Medicare Wellness Visit. I appreciate your ongoing commitment to your health goals. Please review the following plan we discussed and let me know if I can assist you in the future.   Screening recommendations/referrals: Colonoscopy: Done 05/23/13 Mammogram: Done 09/28/19 Bone Density: Done 09/28/18 Recommended yearly ophthalmology/optometry visit for glaucoma screening and checkup Recommended yearly dental visit for hygiene and checkup  Vaccinations: Influenza vaccine: Up to date Pneumococcal vaccine: Up to date Tdap vaccine: Up to date Shingles vaccine: Shingrix discussed. Please contact your pharmacy for coverage information.    Covid-19:Completed 1/24, 2/15, & 11/04/19  Advanced directives: Please bring a copy of your health care power of attorney and living will to the office at your convenience.  Conditions/risks identified: Stay mobile and active   Next appointment: Follow up in one year for your annual wellness visit    Preventive Care 65 Years and Older, Female Preventive care refers to lifestyle choices and visits with your health care provider that can promote health and wellness. What does preventive care include?  A yearly physical exam. This is also called an annual well check.  Dental exams once or twice a year.  Routine eye exams. Ask your health care provider how often you should have your eyes checked.  Personal lifestyle choices, including:  Daily care of your teeth and gums.  Regular physical activity.  Eating a healthy diet.  Avoiding tobacco and drug use.  Limiting alcohol use.  Practicing safe sex.  Taking low-dose aspirin every day.  Taking vitamin and mineral supplements as recommended by your health care provider. What happens during an annual well check? The services and screenings done by your health care provider during your annual well check will depend on your age, overall health,  lifestyle risk factors, and family history of disease. Counseling  Your health care provider may ask you questions about your:  Alcohol use.  Tobacco use.  Drug use.  Emotional well-being.  Home and relationship well-being.  Sexual activity.  Eating habits.  History of falls.  Memory and ability to understand (cognition).  Work and work Statistician.  Reproductive health. Screening  You may have the following tests or measurements:  Height, weight, and BMI.  Blood pressure.  Lipid and cholesterol levels. These may be checked every 5 years, or more frequently if you are over 37 years old.  Skin check.  Lung cancer screening. You may have this screening every year starting at age 69 if you have a 30-pack-year history of smoking and currently smoke or have quit within the past 15 years.  Fecal occult blood test (FOBT) of the stool. You may have this test every year starting at age 7.  Flexible sigmoidoscopy or colonoscopy. You may have a sigmoidoscopy every 5 years or a colonoscopy every 10 years starting at age 64.  Hepatitis C blood test.  Hepatitis B blood test.  Sexually transmitted disease (STD) testing.  Diabetes screening. This is done by checking your blood sugar (glucose) after you have not eaten for a while (fasting). You may have this done every 1-3 years.  Bone density scan. This is done to screen for osteoporosis. You may have this done starting at age 32.  Mammogram. This may be done every 1-2 years. Talk to your health care provider about how often you should have regular mammograms. Talk with your health care provider about your test results, treatment options, and if necessary, the need for more  tests. Vaccines  Your health care provider may recommend certain vaccines, such as:  Influenza vaccine. This is recommended every year.  Tetanus, diphtheria, and acellular pertussis (Tdap, Td) vaccine. You may need a Td booster every 10 years.  Zoster  vaccine. You may need this after age 51.  Pneumococcal 13-valent conjugate (PCV13) vaccine. One dose is recommended after age 5.  Pneumococcal polysaccharide (PPSV23) vaccine. One dose is recommended after age 41. Talk to your health care provider about which screenings and vaccines you need and how often you need them. This information is not intended to replace advice given to you by your health care provider. Make sure you discuss any questions you have with your health care provider. Document Released: 02/16/2015 Document Revised: 10/10/2015 Document Reviewed: 11/21/2014 Elsevier Interactive Patient Education  2017 [place] Prevention in the Home Falls can cause injuries. They can happen to people of all ages. There are many things you can do to make your home safe and to help prevent falls. What can I do on the outside of my home?  Regularly fix the edges of walkways and driveways and fix any cracks.  Remove anything that might make you trip as you walk through a door, such as a raised step or threshold.  Trim any bushes or trees on the path to your home.  Use bright outdoor lighting.  Clear any walking paths of anything that might make someone trip, such as rocks or tools.  Regularly check to see if handrails are loose or broken. Make sure that both sides of any steps have handrails.  Any raised decks and porches should have guardrails on the edges.  Have any leaves, snow, or ice cleared regularly.  Use sand or salt on walking paths during winter.  Clean up any spills in your garage right away. This includes oil or grease spills. What can I do in the bathroom?  Use night lights.  Install grab bars by the toilet and in the tub and shower. Do not use towel bars as grab bars.  Use non-skid mats or decals in the tub or shower.  If you need to sit down in the shower, use a plastic, non-slip stool.  Keep the floor dry. Clean up any water that spills on the  floor as soon as it happens.  Remove soap buildup in the tub or shower regularly.  Attach bath mats securely with double-sided non-slip rug tape.  Do not have throw rugs and other things on the floor that can make you trip. What can I do in the bedroom?  Use night lights.  Make sure that you have a light by your bed that is easy to reach.  Do not use any sheets or blankets that are too big for your bed. They should not hang down onto the floor.  Have a firm chair that has side arms. You can use this for support while you get dressed.  Do not have throw rugs and other things on the floor that can make you trip. What can I do in the kitchen?  Clean up any spills right away.  Avoid walking on wet floors.  Keep items that you use a lot in easy-to-reach places.  If you need to reach something above you, use a strong step stool that has a grab bar.  Keep electrical cords out of the way.  Do not use floor polish or wax that makes floors slippery. If you must use wax, use non-skid floor  wax.  Do not have throw rugs and other things on the floor that can make you trip. What can I do with my stairs?  Do not leave any items on the stairs.  Make sure that there are handrails on both sides of the stairs and use them. Fix handrails that are broken or loose. Make sure that handrails are as long as the stairways.  Check any carpeting to make sure that it is firmly attached to the stairs. Fix any carpet that is loose or worn.  Avoid having throw rugs at the top or bottom of the stairs. If you do have throw rugs, attach them to the floor with carpet tape.  Make sure that you have a light switch at the top of the stairs and the bottom of the stairs. If you do not have them, ask someone to add them for you. What else can I do to help prevent falls?  Wear shoes that:  Do not have high heels.  Have rubber bottoms.  Are comfortable and fit you well.  Are closed at the toe. Do not wear  sandals.  If you use a stepladder:  Make sure that it is fully opened. Do not climb a closed stepladder.  Make sure that both sides of the stepladder are locked into place.  Ask someone to hold it for you, if possible.  Clearly mark and make sure that you can see:  Any grab bars or handrails.  First and last steps.  Where the edge of each step is.  Use tools that help you move around (mobility aids) if they are needed. These include:  Canes.  Walkers.  Scooters.  Crutches.  Turn on the lights when you go into a dark area. Replace any light bulbs as soon as they burn out.  Set up your furniture so you have a clear path. Avoid moving your furniture around.  If any of your floors are uneven, fix them.  If there are any pets around you, be aware of where they are.  Review your medicines with your doctor. Some medicines can make you feel dizzy. This can increase your chance of falling. Ask your doctor what other things that you can do to help prevent falls. This information is not intended to replace advice given to you by your health care provider. Make sure you discuss any questions you have with your health care provider. Document Released: 11/16/2008 Document Revised: 06/28/2015 Document Reviewed: 02/24/2014 Elsevier Interactive Patient Education  2017 [employer].

## 2020-05-02 DIAGNOSIS — H57813 Brow ptosis, bilateral: Secondary | ICD-10-CM | POA: Diagnosis not present

## 2020-05-02 DIAGNOSIS — H02834 Dermatochalasis of left upper eyelid: Secondary | ICD-10-CM | POA: Diagnosis not present

## 2020-05-02 DIAGNOSIS — H02413 Mechanical ptosis of bilateral eyelids: Secondary | ICD-10-CM | POA: Diagnosis not present

## 2020-05-02 DIAGNOSIS — D485 Neoplasm of uncertain behavior of skin: Secondary | ICD-10-CM | POA: Diagnosis not present

## 2020-05-02 DIAGNOSIS — H02423 Myogenic ptosis of bilateral eyelids: Secondary | ICD-10-CM | POA: Diagnosis not present

## 2020-05-02 DIAGNOSIS — H53483 Generalized contraction of visual field, bilateral: Secondary | ICD-10-CM | POA: Diagnosis not present

## 2020-05-02 DIAGNOSIS — H0279 Other degenerative disorders of eyelid and periocular area: Secondary | ICD-10-CM | POA: Diagnosis not present

## 2020-05-02 DIAGNOSIS — H02831 Dermatochalasis of right upper eyelid: Secondary | ICD-10-CM | POA: Diagnosis not present

## 2020-05-08 ENCOUNTER — Other Ambulatory Visit (HOSPITAL_COMMUNITY): Payer: Self-pay

## 2020-05-08 NOTE — Progress Notes (Signed)
Carelink Summary Report / Loop Recorder 

## 2020-05-14 DIAGNOSIS — H53483 Generalized contraction of visual field, bilateral: Secondary | ICD-10-CM | POA: Diagnosis not present

## 2020-05-24 ENCOUNTER — Other Ambulatory Visit: Payer: Self-pay

## 2020-05-24 MED FILL — Albuterol Sulfate Inhal Aero 108 MCG/ACT (90MCG Base Equiv): RESPIRATORY_TRACT | 25 days supply | Qty: 18 | Fill #0 | Status: AC

## 2020-05-24 MED FILL — Butalbital-Acetaminophen-Caffeine Tab 50-325-40 MG: ORAL | 22 days supply | Qty: 90 | Fill #0 | Status: AC

## 2020-05-25 ENCOUNTER — Other Ambulatory Visit (HOSPITAL_COMMUNITY): Payer: Self-pay

## 2020-05-25 ENCOUNTER — Telehealth: Payer: Self-pay | Admitting: Emergency Medicine

## 2020-05-25 NOTE — Telephone Encounter (Signed)
LMOM to call device clinic- LINQ @ [facility]

## 2020-05-28 ENCOUNTER — Ambulatory Visit (INDEPENDENT_AMBULATORY_CARE_PROVIDER_SITE_OTHER): Payer: Medicare PPO

## 2020-05-28 DIAGNOSIS — I495 Sick sinus syndrome: Secondary | ICD-10-CM

## 2020-05-30 ENCOUNTER — Other Ambulatory Visit (HOSPITAL_COMMUNITY): Payer: Self-pay

## 2020-05-30 LAB — CUP PACEART REMOTE DEVICE CHECK
Date Time Interrogation Session: 20220423234443
Implantable Pulse Generator Implant Date: 20180830

## 2020-05-30 NOTE — Telephone Encounter (Signed)
LMOM to call device clinic with # and office hours. LINQ at [facility].

## 2020-05-31 ENCOUNTER — Other Ambulatory Visit (HOSPITAL_COMMUNITY): Payer: Self-pay

## 2020-05-31 MED FILL — Alprazolam Tab 0.5 MG: ORAL | 30 days supply | Qty: 60 | Fill #0 | Status: AC

## 2020-06-03 ENCOUNTER — Other Ambulatory Visit: Payer: Self-pay | Admitting: Ophthalmology

## 2020-06-04 ENCOUNTER — Encounter: Payer: Self-pay | Admitting: Neurology

## 2020-06-04 ENCOUNTER — Other Ambulatory Visit: Payer: Self-pay

## 2020-06-04 ENCOUNTER — Other Ambulatory Visit (HOSPITAL_COMMUNITY): Payer: Self-pay

## 2020-06-04 ENCOUNTER — Ambulatory Visit: Payer: Medicare PPO | Admitting: Neurology

## 2020-06-04 VITALS — BP 111/68 | HR 85 | Ht 67.0 in | Wt 148.0 lb

## 2020-06-04 DIAGNOSIS — G43719 Chronic migraine without aura, intractable, without status migrainosus: Secondary | ICD-10-CM | POA: Diagnosis not present

## 2020-06-04 MED ORDER — PROMETHAZINE HCL 25 MG PO TABS
25.0000 mg | ORAL_TABLET | Freq: Four times a day (QID) | ORAL | 1 refills | Status: DC | PRN
  Filled 2020-06-04: qty 20, 5d supply, fill #0
  Filled 2021-01-03: qty 20, 5d supply, fill #1

## 2020-06-04 MED ORDER — SUMATRIPTAN SUCCINATE 100 MG PO TABS
ORAL_TABLET | ORAL | 2 refills | Status: DC
  Filled 2020-06-04: qty 27, 90d supply, fill #0
  Filled 2020-10-22: qty 27, 90d supply, fill #1
  Filled 2021-02-06: qty 27, 90d supply, fill #2

## 2020-06-04 NOTE — Progress Notes (Signed)
PATIENT: Suzanne [name] DOB: 01/13/1951  REASON FOR VISIT: follow up HISTORY FROM: patient  HISTORY OF PRESENT ILLNESS: Today 06/04/20  Suzanne [name] is a 70 year old female with history of migraine headache.  She remains on Botox therapy, last was in Feb 2022. Has decided to remain on Botox. Last summer thinks she had bad batch of generic Imitrex. The last few batches were fine. Takes an hour to provide relief. With botox pain level is a lot less, stays around 2/10 at it's worst. Next appointment is end of this month. Is going to [place], in June. On average, at least 9 migraines a month, more like 15 a month. Keeps Fioricet for morning headaches, when waking, has been for 30 years. Works well with heating pad. No changes to medical history.  Here today for evaluation unaccompanied.  Update 06/01/19 SS: Suzanne [name] is a 70 year old female with history of migraine headache.  She remains on Botox therapy. She was last given Botox May 19, 2019. She has historically benefited well from Botox. However, over the last year, questions if the benefit may be wearing off. She presents today, reporting a 6-day headache, currently 3/10. She is usually prescribed Fioricet, but was given less recently and has ran out of the medication. She has been taking Imitrex, but this only works for about 12 hours, has been taking for the last 4 days. She is not able to take NSAID as she is on Eliquis. She is hesitant to use steroids, had a left knee replacement in November. She has Phenergan to take if needed. Of lately, she has had an increase in headache, likely as she just received Botox, along with stress related to the loss of her sister, and undergoing a remodel project at home. She presents today reporting her typical migraine pattern, she presents for follow-up unaccompanied.  HISTORY 06/01/2018 Dr. Jannifer [name]: Suzanne [name] is a 70 year old right-handed white female with history of migraine headache.  The  patient is on Botox therapy, she has gained good improvement with the headaches.  She still has some occasional headaches that she requires Imitrex for, but her headaches never become extremely severe, they may get to a level of 2 through 4, but never more severe than that.  At times she may have nausea, she no longer has Phenergan to take.  The patient overall is pleased with her current therapy.  The Imitrex is still effective.  She has Fioricet to take as well if needed.  She is due for her next Botox injection in June 2020.  REVIEW OF SYSTEMS: Out of a complete 14 system review of symptoms, the patient complains only of the following symptoms, and all other reviewed systems are negative.  Headache  ALLERGIES: Allergies  Allergen Reactions  . Dilaudid [Hydromorphone Hcl] Nausea And Vomiting  . Fentanyl Nausea And Vomiting  . Ciprofloxacin Rash  . Erythromycin Rash  . Percocet [Oxycodone-Acetaminophen] Nausea And Vomiting    HOME MEDICATIONS: Outpatient Medications Prior to Visit  Medication Sig Dispense Refill  . acetaminophen (TYLENOL) 325 MG tablet Take 325 mg 2 (two) times daily as needed by mouth (pain).     Marland Kitchen albuterol (VENTOLIN HFA) 108 (90 Base) MCG/ACT inhaler INHALE 2 PUFFS INTO THE LUNGS EVERY 6 HOURS AS NEEDED FOR WHEEZING OR SHORTNESS OF BREATH. 18 g 2  . ALPRAZolam (XANAX) 0.5 MG tablet TAKE ONE TABLET BY MOUTH TWICE A DAY AS NEEDED FOR ANXIETY 60 tablet 2  . apixaban (ELIQUIS) 5 MG TABS tablet TAKE  1 TABLET BY MOUTH TWICE DAILY 180 tablet 1  . BOTOX 200 units SOLR INJECT 200 UNITS  INTRAMUSCULARLY EVERY 3  MONTHS (GIVEN AT MD OFFICE, DISCARD UNUSED PORTION  AFTER FIRST USE) 1 each 3  . buPROPion (WELLBUTRIN XL) 300 MG 24 hr tablet TAKE 1 TABLET BY MOUTH EVERY MORNING 90 tablet 1  . butalbital-acetaminophen-caffeine (FIORICET) 50-325-40 MG tablet TAKE 1 TABLET BY MOUTH EVERY 6 HOURS AS NEEDED FOR HEADACHE. 90 tablet 1  . Diclofenac Sodium 1 % CREA as needed.    .  dorzolamide-timolol (COSOPT) 22.3-6.8 MG/ML ophthalmic solution Place 1 drop 2 (two) times daily into both eyes.   3  . Hypromellose (ARTIFICIAL TEARS OP) Place 1 drop into both eyes daily as needed (dry eyes).     . Probiotic Product (ALIGN PO) Take 4 mg by mouth daily.    . sertraline (ZOLOFT) 100 MG tablet TAKE 1 TABLET BY MOUTH ONCE A DAY 90 tablet 1  . Simethicone (GAS-X PO) Take 2 tablets daily as needed by mouth (gas).    . sodium chloride (OCEAN) 0.65 % SOLN nasal spray Place 1 spray as needed into both nostrils for congestion.    . IMITREX 100 MG tablet TAKE 1 TABLET BY MOUTH AT THE ONSET OF A HEADACHE DAILY AS NEEDED *Brand is medically necessary.* 27 tablet 2  . promethazine (PHENERGAN) 25 MG tablet Take 1 tablet (25 mg total) by mouth every 6 (six) hours as needed for nausea or vomiting. 20 tablet 1  . ALPRAZolam (XANAX) 0.5 MG tablet Take 0.5 mg at bedtime as needed by mouth for anxiety or sleep. Anxiety or sleep    . buPROPion (WELLBUTRIN XL) 300 MG 24 hr tablet Take 300 mg by mouth daily.    . sertraline (ZOLOFT) 100 MG tablet Take 100 mg by mouth daily with breakfast.     Facility-Administered Medications Prior to Visit  Medication Dose Route Frequency Provider Last Rate Last Admin  . botulinum toxin Type A (BOTOX) injection 200 Units  200 Units Intramuscular Once Kathrynn Ducking, MD        PAST MEDICAL HISTORY: Past Medical History:  Diagnosis Date  . AICD (automatic cardioverter/defibrillator) present    loop recorder  . Anxiety   . Anxiety state 12/14/2018  . Asthma   . Atrial fibrillation ([place])    s/p ablations  . Atrial flutter ([place])   . COPD (chronic obstructive pulmonary disease) ([place])   . Degenerative arthritis    "maybe in my left knee" (12/16/2016)  . Depression   . Dysrhythmia 2010   A-fib  . Frozen shoulder    hx bilaterally; "had to go thru PT; no problem w/them since I learned exercises" (12/16/2016)  . GERD (gastroesophageal reflux disease)   .  Glaucoma   . H/O: rheumatic fever 1957  . High cholesterol   . Migraine    "getting botox now; now only 2-3/month" (12/16/2016)  . PONV (postoperative nausea and vomiting)    with Fentanyl,  Dilaudid &  Propofol (12/16/2016)    PAST SURGICAL HISTORY: Past Surgical History:  Procedure Laterality Date  . ABDOMINAL HYSTERECTOMY  1990   right ovary removed  . APPENDECTOMY  1990  . ATRIAL FIBRILLATION ABLATION N/A 12/16/2016   Procedure: ATRIAL FIBRILLATION ABLATION;  Surgeon: Thompson Grayer, MD;  Location: [facility];  Service: Cardiovascular;  Laterality: N/A;  . CATARACT EXTRACTION, BILATERAL    . CTI ablation  3/11  . ESOPHAGOGASTRODUODENOSCOPY (EGD) WITH PROPOFOL N/A 01/06/2013  Procedure: ESOPHAGOGASTRODUODENOSCOPY (EGD) WITH PROPOFOL;  Surgeon: Cleotis Nipper, MD;  Location: [facility];  Service: Endoscopy;  Laterality: N/A;  . KNEE ARTHROSCOPY Bilateral 1999-2006  . KNEE SURGERY Bilateral 9924,2683   "open; for congenital deformity"  . LOOP RECORDER INSERTION N/A 10/02/2016   Procedure: LOOP RECORDER INSERTION;  Surgeon: Thompson Grayer, MD;  Location: [facility];  Service: Cardiovascular;  Laterality: N/A;  . PERIPHERAL VASCULAR INTERVENTION  2/11  & 6/11  . RHINOPLASTY  1965  . TONSILLECTOMY  as child  . TOOTH EXTRACTION  2009   2 teeth extractted  . TOTAL KNEE ARTHROPLASTY Left 12/27/2018   Procedure: TOTAL KNEE ARTHROPLASTY;  Surgeon: Elsie Saas, MD;  Location: [facility];  Service: Orthopedics;  Laterality: Left;    FAMILY HISTORY: Family History  Problem Relation Age of Onset  . Cancer Mother        renal cell  . Migraines Mother   . Dementia Father   . Heart disease Father        mid 23s  . Other Brother        died 93- out for run- no autopsy  . Hyperlipidemia Brother   . Breast cancer Neg Hx     SOCIAL HISTORY: Social History   Socioeconomic History  . Marital status: Married    Spouse name: Not on file  . Number of children: 0  .  Years of education: 37  . Highest education level: Not on file  Occupational History  . 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 Occupation: CLINICAL NURSE CARE    Employer: [employer]  Tobacco Use  . Smoking status: Current Every Day Smoker    Packs/day: 0.25    Years: 50.00    Pack years: 12.50    Types: Cigarettes  . Smokeless tobacco: Never Used  . Tobacco comment: Discussed reduce to quit, gave card with resources to help in the community  Vaping Use  . Vaping Use: Never used  Substance and Sexual Activity  . Alcohol use: Yes    Comment: 12/16/2016 "1-2 glasses of wine per year"  . Drug use: No  . Sexual activity: Not Currently  Other Topics Concern  . Not on file  Social History Narrative   Lives at home w/ husband and 1 dog   Patient is right handed.   Patient drinks one cup caffeine.   Social Determinants of Health   Financial Resource Strain: Low Risk   . Difficulty of Paying Living Expenses: Not hard at all  Food Insecurity: No Food Insecurity  . Worried About Charity fundraiser in the Last Year: Never true  . Ran Out of Food in the Last Year: Never true  Transportation Needs: No Transportation Needs  . Lack of Transportation (Medical): No  . Lack of Transportation (Non-Medical): No  Physical Activity: Inactive  . Days of Exercise per Week: 0 days  . Minutes of Exercise per Session: 0 min  Stress: No Stress Concern Present  . Feeling of Stress : Only a little  Social Connections: Socially Integrated  . Frequency of Communication with Friends and Family: More than three times a week  . Frequency of Social Gatherings with Friends and Family: Twice a week  . Attends Religious Services: 1 to 4 times per year  . Active Member of Clubs or Organizations: Yes  . Attends Archivist Meetings: 1 to 4 times per year  .  Marital Status: Married  Human resources officer Violence: Not At Risk  . Fear of Current or Ex-Partner: No   . Emotionally Abused: No  . Physically Abused: No  . Sexually Abused: No   PHYSICAL EXAM  Vitals:   06/04/20 1105  BP: 111/68  Pulse: 85  Weight: 148 lb (67.1 kg)  Height: 5\' 7"  (1.702 m)   Body mass index is 23.18 kg/m.  Generalized: Well developed, in no acute distress   Neurological examination  Mentation: Alert oriented to time, place, history taking. Follows all commands speech and language fluent Cranial nerve II-XII: Pupils were equal round reactive to light. Extraocular movements were full, visual field were full on confrontational test. Facial sensation and strength were normal. Head turning and shoulder shrug  were normal and symmetric. Motor: The motor testing reveals 5 over 5 strength of all 4 extremities. Good symmetric motor tone is noted throughout.  Sensory: Sensory testing is intact to soft touch on all 4 extremities. No evidence of extinction is noted.  Coordination: Cerebellar testing reveals good finger-nose-finger and heel-to-shin bilaterally.  Gait and station: Gait is normal. Tandem gait is normal. Romberg is negative. No drift is seen.  Reflexes: Deep tendon reflexes are symmetric but slightly decreased at the knees  DIAGNOSTIC DATA (LABS, IMAGING, TESTING) - I reviewed patient records, labs, notes, testing and imaging myself where available.  Lab Results  Component Value Date   WBC 5.6 10/20/2019   HGB 13.5 10/20/2019   HCT 40.1 10/20/2019   MCV 95.0 10/20/2019   PLT 196 10/20/2019      Component Value Date/Time   NA 138 10/20/2019 1019   NA 141 12/05/2016 1407   K 4.4 10/20/2019 1019   CL 102 10/20/2019 1019   CO2 29 10/20/2019 1019   GLUCOSE 85 10/20/2019 1019   BUN 14 10/20/2019 1019   BUN 11 12/05/2016 1407   CREATININE 0.75 10/20/2019 1019   CALCIUM 9.7 10/20/2019 1019   PROT 6.4 10/20/2019 1019   ALBUMIN 4.4 07/11/2019 1152   AST 11 10/20/2019 1019   ALT 10 10/20/2019 1019   ALKPHOS 93 07/11/2019 1152   BILITOT 0.5 10/20/2019  1019   GFRNONAA 81 10/20/2019 1019   GFRAA 94 10/20/2019 1019   Lab Results  Component Value Date   CHOL 252 (H) 07/11/2019   HDL 61.70 07/11/2019   LDLCALC 163 (H) 07/11/2019   TRIG 137.0 07/11/2019   CHOLHDL 4 07/11/2019   No results found for: HGBA1C No results found for: VITAMINB12 Lab Results  Component Value Date   TSH 2.755 12/02/2013      ASSESSMENT AND PLAN 70 y.o. year old female  has a past medical history of AICD (automatic cardioverter/defibrillator) present, Anxiety, Anxiety state (12/14/2018), Asthma, Atrial fibrillation ([place]), Atrial flutter ([place]), COPD (chronic obstructive pulmonary disease) ([place]), Degenerative arthritis, Depression, Dysrhythmia (2010), Frozen shoulder, GERD (gastroesophageal reflux disease), Glaucoma, H/O: rheumatic fever (1957), High cholesterol, Migraine, and PONV (postoperative nausea and vomiting). here with:  1. Chronic migraine headache  -Headaches remain overall stable, continues to benefit from Botox -Will continue on Botox therapy for chronic migraine headaches, next injection is end of May 2022 -Continue Imitrex as needed for acute headache, only gets # 9 a month  -Continue Fioricet as needed, takes for morning headache -Refill Phenergan as needed for nausea with headache -Follow-up in 1 year or sooner if needed  I spent 20 minutes of face-to-face and non-face-to-face time with patient.  This included previsit chart review, lab review, study review,  order entry, electronic health record documentation, patient education.  Butler Denmark, AGNP-C, DNP 06/04/2020, 12:03 PM [facility] [address] [place], [place] [address] [phone]

## 2020-06-04 NOTE — Patient Instructions (Signed)
Keep current medications  Continue Botox  See you back in 1 year

## 2020-06-05 ENCOUNTER — Other Ambulatory Visit (HOSPITAL_COMMUNITY): Payer: Self-pay

## 2020-06-05 ENCOUNTER — Other Ambulatory Visit: Payer: Self-pay | Admitting: Ophthalmology

## 2020-06-05 NOTE — Telephone Encounter (Signed)
The patient was returning [place] phone call. She would like a phone call back. Her phone number [phone].

## 2020-06-05 NOTE — Telephone Encounter (Signed)
Third unsuccessful telephone encounter to patient and spouse (on [place]) to discuss [email or web address]. Hipaa compliant VM message left requesting call back. Certified letter sent indicating inability to contact via telephone.

## 2020-06-05 NOTE — Telephone Encounter (Signed)
Unsuccessful attempt to return patient's call. HIPAA compliant VM message left requesting call back to [phone].

## 2020-06-05 NOTE — Progress Notes (Signed)
I have read the note, and I agree with the clinical assessment and plan.  Marvia Troost K Tamberlyn Midgley   

## 2020-06-07 ENCOUNTER — Other Ambulatory Visit: Payer: Self-pay | Admitting: Ophthalmology

## 2020-06-07 ENCOUNTER — Other Ambulatory Visit (HOSPITAL_COMMUNITY): Payer: Self-pay

## 2020-06-08 ENCOUNTER — Other Ambulatory Visit (HOSPITAL_COMMUNITY): Payer: Self-pay

## 2020-06-08 MED ORDER — DORZOLAMIDE HCL-TIMOLOL MAL 2-0.5 % OP SOLN
OPHTHALMIC | 0 refills | Status: DC
  Filled 2020-06-08: qty 10, 40d supply, fill #0
  Filled 2020-06-08: qty 10, 50d supply, fill #0

## 2020-06-11 ENCOUNTER — Other Ambulatory Visit (HOSPITAL_COMMUNITY): Payer: Self-pay

## 2020-06-11 MED ORDER — DORZOLAMIDE HCL-TIMOLOL MAL 2-0.5 % OP SOLN
OPHTHALMIC | 4 refills | Status: DC
  Filled 2020-06-11 – 2020-10-02 (×2): qty 10, 50d supply, fill #0
  Filled 2020-12-04: qty 10, 50d supply, fill #1
  Filled 2021-02-06: qty 10, 50d supply, fill #2
  Filled 2021-04-09: qty 10, 50d supply, fill #3

## 2020-06-12 ENCOUNTER — Telehealth: Payer: Self-pay

## 2020-06-12 ENCOUNTER — Other Ambulatory Visit: Payer: Self-pay

## 2020-06-12 NOTE — Telephone Encounter (Signed)
Attempted to return patient's call. Left message on voicemail with [facility] Number [phone]. LINQ [facility] as of 05/24/20.

## 2020-06-12 NOTE — Telephone Encounter (Signed)
Patient called back returning call she would like a call back today

## 2020-06-13 ENCOUNTER — Telehealth: Payer: Self-pay | Admitting: *Deleted

## 2020-06-13 ENCOUNTER — Encounter: Payer: Self-pay | Admitting: Family Medicine

## 2020-06-13 DIAGNOSIS — K591 Functional diarrhea: Secondary | ICD-10-CM | POA: Diagnosis not present

## 2020-06-13 DIAGNOSIS — R1011 Right upper quadrant pain: Secondary | ICD-10-CM | POA: Diagnosis not present

## 2020-06-13 DIAGNOSIS — R634 Abnormal weight loss: Secondary | ICD-10-CM | POA: Diagnosis not present

## 2020-06-13 NOTE — Telephone Encounter (Signed)
   [facility] Pre-operative Risk Assessment    Patient Name: Suzanne [name]  DOB: Jun 19, 1950  MRN: [id number]   HEARTCARE STAFF: - Please ensure there is not already an duplicate clearance open for this procedure. - Under Visit Info/Reason for Call, type in Other and utilize the format Clearance MM/DD/YY or Clearance TBD. Do not use dashes or single digits. - If request is for dental extraction, please clarify the # of teeth to be extracted.  Request for surgical clearance:  1. What type of surgery is being performed? COLONOSCOPY   2. When is this surgery scheduled? 08/14/20   3. What type of clearance is required (medical clearance vs. Pharmacy clearance to hold med vs. Both)? BOTH  4. Are there any medications that need to be held prior to surgery and how long? HOLD ELIQUIS THE DAY BEFORE PROCEDURE AND THE MORNING OF PROCEDURE     5. Practice name and name of physician performing surgery? [facility]; DR. Cristina Gong   6. What is the office phone number? [phone]   7.   What is the office fax number? [phone]  8.   Anesthesia type (None, local, MAC, general) ? NOT LISTED   Julaine Hua 06/13/2020, 2:08 PM  _________________________________________________________________   (provider comments below)

## 2020-06-14 ENCOUNTER — Other Ambulatory Visit: Payer: Self-pay | Admitting: Internal Medicine

## 2020-06-14 MED ORDER — ELIQUIS 5 MG PO TABS
ORAL_TABLET | Freq: Two times a day (BID) | ORAL | 1 refills | Status: DC
  Filled 2020-06-29: qty 180, 90d supply, fill #0
  Filled 2020-10-09: qty 180, 90d supply, fill #1

## 2020-06-14 NOTE — Telephone Encounter (Signed)
Patient with diagnosis of atrial fibrillation on Eliquis for anticoagulation.    Procedure: colonoscopy Date of procedure: 08/14/20   CHA2DS2-VASc Score = 2  This indicates a 2.2% annual risk of stroke. The patient's score is based upon: CHF History: No HTN History: No Diabetes History: No Stroke History: No Vascular Disease History: No Age Score: 1 Gender Score: 1     CrCl 72.9 Platelet count 196  Per office protocol, patient can hold Eliquis for 1 day prior to procedure.   Patient will not need bridging with Lovenox (enoxaparin) around procedure.

## 2020-06-14 NOTE — Telephone Encounter (Signed)
   Name: Suzanne [name]  DOB: 05/26/1950  MRN: [id number]   Primary Cardiologist: Thompson Grayer, MD  Chart reviewed as part of pre-operative protocol coverage.   Left VM. Sent MyChart.   Loel Dubonnet, NP 06/14/2020, 3:32 PM

## 2020-06-14 NOTE — Telephone Encounter (Signed)
Prescription refill request for Eliquis received.  Indication: afib  Last office visit: [facility], 10/24/2019 Scr: 0.75, 10/20/2019 Age: 70 yo  Weight: 67.1 kg   Pt is on the correct dose of Eliquis per dosing criteria, prescription refill sent for Eliquis 5mg  BID.

## 2020-06-14 NOTE — Progress Notes (Signed)
Carelink Summary Report / Loop Recorder 

## 2020-06-15 ENCOUNTER — Other Ambulatory Visit (HOSPITAL_COMMUNITY): Payer: Self-pay

## 2020-06-15 NOTE — Telephone Encounter (Signed)
   Name: Suzanne [name]  DOB: [birth year]  MRN: [id number]   Primary Cardiologist: Thompson Grayer, MD  Chart reviewed as part of pre-operative protocol coverage. Patient was contacted 06/15/2020 in reference to pre-operative risk assessment for pending surgery as outlined below.  Suzanne [name] was last seen on 10/24/19 by Dr. Rayann Heman and recommended for 1 year follow up.  Since that day, Suzanne [name] has done well.  Per pharmacy review and office protocol she may hold Eliquis 1 day prior to the planned procedure.   Therefore, based on ACC/AHA guidelines, the patient would be at acceptable risk for the planned procedure without further cardiovascular testing.   The patient was advised that if she develops new symptoms prior to surgery to contact our office to arrange for a follow-up visit, and she verbalized understanding.  I will route this recommendation to the requesting party via Epic fax function and remove from pre-op pool. Please call with questions.  Loel Dubonnet, NP 06/15/2020, 1:09 PM

## 2020-06-15 NOTE — Telephone Encounter (Signed)
    Keysi Oelkers Hildebrant DOB:  [birth year]  MRN:  [id number]   Primary Cardiologist: Thompson Grayer, MD  Chart reviewed as part of pre-operative protocol coverage.  Left VM requesting call back.  Loel Dubonnet, NP 06/15/2020, 11:05 AM

## 2020-06-19 ENCOUNTER — Other Ambulatory Visit (HOSPITAL_COMMUNITY): Payer: Self-pay

## 2020-06-19 ENCOUNTER — Other Ambulatory Visit: Payer: Self-pay

## 2020-06-20 ENCOUNTER — Other Ambulatory Visit (HOSPITAL_COMMUNITY): Payer: Self-pay

## 2020-06-20 NOTE — Telephone Encounter (Signed)
Left detailed mwessage per DPR that loop at [facility] and to call DC . DC # provided.

## 2020-06-21 ENCOUNTER — Other Ambulatory Visit (HOSPITAL_COMMUNITY): Payer: Self-pay

## 2020-06-25 ENCOUNTER — Other Ambulatory Visit (HOSPITAL_COMMUNITY): Payer: Self-pay

## 2020-06-25 ENCOUNTER — Encounter: Payer: Self-pay | Admitting: Neurology

## 2020-06-25 ENCOUNTER — Ambulatory Visit: Payer: Medicare PPO | Admitting: Neurology

## 2020-06-25 VITALS — BP 124/72 | HR 83 | Ht 67.0 in | Wt 145.2 lb

## 2020-06-25 DIAGNOSIS — G43719 Chronic migraine without aura, intractable, without status migrainosus: Secondary | ICD-10-CM

## 2020-06-25 MED ORDER — ONABOTULINUMTOXINA 100 UNITS IJ SOLR: 200.0000 [IU] | Freq: Once | INTRAMUSCULAR | Status: DC

## 2020-06-25 NOTE — Procedures (Signed)
     BOTOX PROCEDURE NOTE FOR MIGRAINE HEADACHE   HISTORY: Suzanne [name] is a 70 year old patient with a history of intractable migraine headache who comes in for Botox injection.  She has gained improvement with the injections in the past.   Description of procedure:  The patient was placed in a sitting position. The standard protocol was used for Botox as follows, with 5 units of Botox injected at each site:   -Procerus muscle, midline injection  -Corrugator muscle, bilateral injection  -Frontalis muscle, bilateral injection, with 2 sites each side, medial injection was performed in the upper one third of the frontalis muscle, in the region vertical from the medial inferior edge of the superior orbital rim. The lateral injection was again in the upper one third of the forehead vertically above the lateral limbus of the cornea, 1.5 cm lateral to the medial injection site.  -Temporalis muscle injection, 4 sites, bilaterally. The first injection was 3 cm above the tragus of the ear, second injection site was 1.5 cm to 3 cm up from the first injection site in line with the tragus of the ear. The third injection site was 1.5-3 cm forward between the first 2 injection sites. The fourth injection site was 1.5 cm posterior to the second injection site.  -Occipitalis muscle injection, 3 sites, bilaterally. The first injection was done one half way between the occipital protuberance and the tip of the mastoid process behind the ear. The second injection site was done lateral and superior to the first, 1 fingerbreadth from the first injection. The third injection site was 1 fingerbreadth superiorly and medially from the first injection site.  -Cervical paraspinal muscle injection, 2 sites, bilateral, the first injection site was 1 cm from the midline of the cervical spine, 3 cm inferior to the lower border of the occipital protuberance. The second injection site was 1.5 cm superiorly and laterally  to the first injection site.  -Trapezius muscle injection was performed at 3 sites, bilaterally. The first injection site was in the upper trapezius muscle halfway between the inflection point of the neck, and the acromion. The second injection site was one half way between the acromion and the first injection site. The third injection was done between the first injection site and the inflection point of the neck.   A 200 unit bottle of Botox was used, 155 units were injected, the rest of the Botox was wasted. The patient tolerated the procedure well, there were no complications of the above procedure.  Botox NDC 5009-3818-29 Lot number [id number] Expiration date January 2025

## 2020-06-25 NOTE — Progress Notes (Signed)
Please refer to Botox procedure note. 

## 2020-06-26 ENCOUNTER — Other Ambulatory Visit (HOSPITAL_COMMUNITY): Payer: Self-pay

## 2020-06-26 DIAGNOSIS — F331 Major depressive disorder, recurrent, moderate: Secondary | ICD-10-CM | POA: Diagnosis not present

## 2020-06-26 DIAGNOSIS — F411 Generalized anxiety disorder: Secondary | ICD-10-CM | POA: Diagnosis not present

## 2020-06-26 MED ORDER — BUPROPION HCL ER (XL) 300 MG PO TB24
ORAL_TABLET | Freq: Every morning | ORAL | 0 refills | Status: DC
  Filled 2020-06-26: qty 90, 90d supply, fill #0

## 2020-06-26 MED ORDER — SERTRALINE HCL 100 MG PO TABS
1.0000 | ORAL_TABLET | Freq: Every day | ORAL | 2 refills | Status: DC
  Filled 2020-09-18: qty 90, 90d supply, fill #0

## 2020-06-26 MED ORDER — ALPRAZOLAM 0.5 MG PO TABS
ORAL_TABLET | ORAL | 2 refills | Status: DC
  Filled 2020-06-26: qty 60, 30d supply, fill #0

## 2020-06-26 MED ORDER — BUPROPION HCL ER (XL) 300 MG PO TB24
1.0000 | ORAL_TABLET | Freq: Every morning | ORAL | 2 refills | Status: DC
  Filled 2020-09-18: qty 90, 90d supply, fill #0

## 2020-06-26 MED ORDER — SERTRALINE HCL 100 MG PO TABS
ORAL_TABLET | Freq: Every day | ORAL | 0 refills | Status: DC
  Filled 2020-06-26: qty 90, 90d supply, fill #0

## 2020-06-27 MED FILL — Alprazolam Tab 0.5 MG: ORAL | 30 days supply | Qty: 60 | Fill #1 | Status: AC

## 2020-06-29 ENCOUNTER — Other Ambulatory Visit (HOSPITAL_COMMUNITY): Payer: Self-pay

## 2020-06-29 NOTE — Telephone Encounter (Signed)
LMOM that loop at [facility] and to contact device clinic if she wants her loop recorder removed. Instructed to unplug monitor and patient remote appointments have been cancelled.

## 2020-07-12 ENCOUNTER — Encounter: Payer: Medicare PPO | Admitting: Family Medicine

## 2020-07-20 ENCOUNTER — Other Ambulatory Visit (HOSPITAL_COMMUNITY): Payer: Self-pay

## 2020-07-20 ENCOUNTER — Telehealth: Payer: Medicare PPO | Admitting: Emergency Medicine

## 2020-07-20 DIAGNOSIS — U071 COVID-19: Secondary | ICD-10-CM | POA: Diagnosis not present

## 2020-07-20 MED ORDER — MOLNUPIRAVIR EUA 200MG CAPSULE
4.0000 | ORAL_CAPSULE | Freq: Two times a day (BID) | ORAL | 0 refills | Status: AC
  Filled 2020-07-20: qty 40, 5d supply, fill #0

## 2020-07-20 MED ORDER — BENZONATATE 100 MG PO CAPS
100.0000 mg | ORAL_CAPSULE | Freq: Two times a day (BID) | ORAL | 0 refills | Status: DC | PRN
  Filled 2020-07-20: qty 20, 10d supply, fill #0

## 2020-07-20 NOTE — Patient Instructions (Signed)
Take medications as prescribed.  Go to ER if O2 sat drops below 90.  Measure while ambulating if possible and at rest.  If symptoms worsen, go to ER.

## 2020-07-20 NOTE — Progress Notes (Signed)
Virtual Visit Consent   Suzanne [name], you are scheduled for a virtual visit with a [facility] provider today.     Just as with appointments in the office, your consent must be obtained to participate.  Your consent will be active for this visit and any virtual visit you may have with one of our providers in the next 365 days.     If you have a MyChart account, a copy of this consent can be sent to you electronically.  All virtual visits are billed to your insurance company just like a traditional visit in the office.    As this is a virtual visit, video technology does not allow for your provider to perform a traditional examination.  This may limit your provider's ability to fully assess your condition.  If your provider identifies any concerns that need to be evaluated in person or the need to arrange testing (such as labs, EKG, etc.), we will make arrangements to do so.     Although advances in technology are sophisticated, we cannot ensure that it will always work on either your end or our end.  If the connection with a video visit is poor, the visit may have to be switched to a telephone visit.  With either a video or telephone visit, we are not always able to ensure that we have a secure connection.     I need to obtain your verbal consent now.   Are you willing to proceed with your visit today?    Suzanne [name] has provided verbal consent on 07/20/2020 for a virtual visit video.   Montine Circle, PA-C   Date: 07/20/2020 12:05 PM   Virtual Visit via Video Note   I, Montine Circle, connected [id number]@ ([id number], 10/17/1950) on 07/20/20 at 12:00 PM EDT by a video-enabled telemedicine application and verified that I am speaking with the correct person using two identifiers.  Location: Patient: Virtual Visit Location Patient: Home Provider: Virtual Visit Location Provider: Home Office   I discussed the limitations of evaluation and management by telemedicine and the  availability of in person appointments. The patient expressed understanding and agreed to proceed.    History of Present Illness: Suzanne [name] is a 70 y.o. who identifies as a female who was assigned female at birth, and is being seen today for COVID 10.  Has tested positive today.  Symptoms started on Wednesday.  Has been gradually worsening.  Is vaccinated against COVID.  Has tried OTC antipyretics.  States O2 sat is 92%, normally 96-98.  Doesn't require oxygen.  HPI: HPI  Problems:  Patient Active Problem List   Diagnosis Date Noted   Anxiety state 12/14/2018   Glaucoma 01/08/2018   Primary localized osteoarthritis of left knee 01/08/2018   Low vitamin D level 01/08/2018   High cholesterol    Paroxysmal atrial fibrillation [facility]) s/p ablation november 2018, 2011 previously 12/16/2016   Nasal injury 10/20/2016   Intractable chronic migraine without aura 05/17/2014   Tobacco abuse 10/03/2013   S/P ablation of atrial fibrillation 05/16/2013   Depression, major, single episode, complete remission ([place]) 02/01/2009   Asthma 02/01/2009   COPD (chronic obstructive pulmonary disease) ([place]) 02/01/2009    Allergies:  Allergies  Allergen Reactions   Dilaudid [Hydromorphone Hcl] Nausea And Vomiting   Fentanyl Nausea And Vomiting   Ciprofloxacin Rash   Erythromycin Rash   Percocet [Oxycodone-Acetaminophen] Nausea And Vomiting   Medications:  Current Outpatient Medications:    acetaminophen (TYLENOL) 325 MG  tablet, Take 325 mg 2 (two) times daily as needed by mouth (pain). , Disp: , Rfl:    albuterol (VENTOLIN HFA) 108 (90 Base) MCG/ACT inhaler, INHALE 2 PUFFS INTO THE LUNGS EVERY 6 HOURS AS NEEDED FOR WHEEZING OR SHORTNESS OF BREATH., Disp: 18 g, Rfl: 2   ALPRAZolam (XANAX) 0.5 MG tablet, TAKE ONE TABLET BY MOUTH TWICE A DAY AS NEEDED FOR ANXIETY, Disp: 60 tablet, Rfl: 2   ALPRAZolam (XANAX) 0.5 MG tablet, TAKE 1 TABLET BY MOUTH TWICE A DAY AS NEEDED FOR ANXIETY, Disp: 60 tablet, Rfl:  2   apixaban (ELIQUIS) 5 MG TABS tablet, TAKE 1 TABLET BY MOUTH TWICE DAILY, Disp: 180 tablet, Rfl: 1   BOTOX 200 units SOLR, INJECT 200 UNITS  INTRAMUSCULARLY EVERY 3  MONTHS (GIVEN AT MD OFFICE, DISCARD UNUSED PORTION  AFTER FIRST USE), Disp: 1 each, Rfl: 3   buPROPion (WELLBUTRIN XL) 300 MG 24 hr tablet, TAKE 1 TABLET BY MOUTH EVERY MORNING, Disp: 90 tablet, Rfl: 2   butalbital-acetaminophen-caffeine (FIORICET) 50-325-40 MG tablet, TAKE 1 TABLET BY MOUTH EVERY 6 HOURS AS NEEDED FOR HEADACHE., Disp: 90 tablet, Rfl: 1   Diclofenac Sodium 1 % CREA, as needed., Disp: , Rfl:    dorzolamide-timolol (COSOPT) 22.3-6.8 MG/ML ophthalmic solution, Place 1 drop 2 (two) times daily into both eyes. , Disp: , Rfl: 3   dorzolamide-timolol (COSOPT) 22.3-6.8 MG/ML ophthalmic solution, Place 1 drop into both eyes 2 times a day, Disp: 20 mL, Rfl: 0   dorzolamide-timolol (COSOPT) 22.3-6.8 MG/ML ophthalmic solution, PLACE 1 DROP IN BOTH EYES TWO TIMES DAILY, Disp: 20 mL, Rfl: 4   Hypromellose (ARTIFICIAL TEARS OP), Place 1 drop into both eyes daily as needed (dry eyes). , Disp: , Rfl:    Probiotic Product (ALIGN PO), Take 4 mg by mouth daily., Disp: , Rfl:    promethazine (PHENERGAN) 25 MG tablet, Take 1 tablet (25 mg total) by mouth every 6 (six) hours as needed for nausea or vomiting., Disp: 20 tablet, Rfl: 1   sertraline (ZOLOFT) 100 MG tablet, TAKE 1 TABLET BY MOUTH ONCE A DAY, Disp: 90 tablet, Rfl: 2   Simethicone (GAS-X PO), Take 2 tablets daily as needed by mouth (gas)., Disp: , Rfl:    sodium chloride (OCEAN) 0.65 % SOLN nasal spray, Place 1 spray as needed into both nostrils for congestion., Disp: , Rfl:    SUMAtriptan (IMITREX) 100 MG tablet, TAKE 1 TABLET BY MOUTH AT THE ONSET OF A HEADACHE DAILY AS NEEDED, Disp: 27 tablet, Rfl: 2  Current Facility-Administered Medications:    botulinum toxin Type A (BOTOX) injection 200 Units, 200 Units, Intramuscular, Once, Kathrynn Ducking,  MD  Observations/Objective: Patient is well-developed, well-nourished in no acute distress.  Resting comfortably at home.  Head is normocephalic, atraumatic.  No labored breathing.  Speech is clear and coherent with logical content.  Patient is alert and oriented at baseline.    Assessment and Plan: 1. COVID-19 -Molnulpiravir -Tessalon -Rest/fluids -Monitor O2, if <90% go to ER  Follow Up Instructions: I discussed the assessment and treatment plan with the patient. The patient was provided an opportunity to ask questions and all were answered. The patient agreed with the plan and demonstrated an understanding of the instructions.  A copy of instructions were sent to the patient via MyChart.  The patient was advised to call back or seek an in-person evaluation if the symptoms worsen or if the condition fails to improve as anticipated.  Time:  I spent 19  minutes with the patient via telehealth technology discussing the above problems/concerns.    Montine Circle, PA-C

## 2020-07-31 ENCOUNTER — Other Ambulatory Visit: Payer: Self-pay

## 2020-07-31 ENCOUNTER — Ambulatory Visit (INDEPENDENT_AMBULATORY_CARE_PROVIDER_SITE_OTHER): Payer: Medicare PPO | Admitting: Family Medicine

## 2020-07-31 ENCOUNTER — Encounter: Payer: Self-pay | Admitting: Family Medicine

## 2020-07-31 VITALS — BP 108/62 | HR 61 | Temp 98.2°F | Ht 67.0 in | Wt 144.4 lb

## 2020-07-31 DIAGNOSIS — E785 Hyperlipidemia, unspecified: Secondary | ICD-10-CM

## 2020-07-31 DIAGNOSIS — F411 Generalized anxiety disorder: Secondary | ICD-10-CM

## 2020-07-31 DIAGNOSIS — F325 Major depressive disorder, single episode, in full remission: Secondary | ICD-10-CM | POA: Diagnosis not present

## 2020-07-31 DIAGNOSIS — J4520 Mild intermittent asthma, uncomplicated: Secondary | ICD-10-CM | POA: Diagnosis not present

## 2020-07-31 DIAGNOSIS — Z0000 Encounter for general adult medical examination without abnormal findings: Secondary | ICD-10-CM | POA: Diagnosis not present

## 2020-07-31 DIAGNOSIS — F17200 Nicotine dependence, unspecified, uncomplicated: Secondary | ICD-10-CM | POA: Diagnosis not present

## 2020-07-31 DIAGNOSIS — R7989 Other specified abnormal findings of blood chemistry: Secondary | ICD-10-CM

## 2020-07-31 DIAGNOSIS — I700 Atherosclerosis of aorta: Secondary | ICD-10-CM

## 2020-07-31 DIAGNOSIS — J449 Chronic obstructive pulmonary disease, unspecified: Secondary | ICD-10-CM | POA: Diagnosis not present

## 2020-07-31 DIAGNOSIS — J45909 Unspecified asthma, uncomplicated: Secondary | ICD-10-CM

## 2020-07-31 DIAGNOSIS — R1011 Right upper quadrant pain: Secondary | ICD-10-CM | POA: Diagnosis not present

## 2020-07-31 LAB — AMYLASE: Amylase: 30 U/L (ref 27–131)

## 2020-07-31 LAB — POC URINALSYSI DIPSTICK (AUTOMATED)
Bilirubin, UA: NEGATIVE
Blood, UA: NEGATIVE
Glucose, UA: NEGATIVE
Ketones, UA: NEGATIVE
Leukocytes, UA: NEGATIVE
Nitrite, UA: NEGATIVE
Protein, UA: NEGATIVE
Spec Grav, UA: 1.015 (ref 1.010–1.025)
Urobilinogen, UA: 0.2 E.U./dL
pH, UA: 6.5 (ref 5.0–8.0)

## 2020-07-31 LAB — COMPREHENSIVE METABOLIC PANEL
ALT: 12 U/L (ref 0–35)
AST: 15 U/L (ref 0–37)
Albumin: 4.5 g/dL (ref 3.5–5.2)
Alkaline Phosphatase: 89 U/L (ref 39–117)
BUN: 17 mg/dL (ref 6–23)
CO2: 29 mEq/L (ref 19–32)
Calcium: 9.8 mg/dL (ref 8.4–10.5)
Chloride: 100 mEq/L (ref 96–112)
Creatinine, Ser: 0.74 mg/dL (ref 0.40–1.20)
GFR: 81.22 mL/min (ref >60.00)
Glucose, Bld: 86 mg/dL (ref 70–99)
Potassium: 4.2 mEq/L (ref 3.5–5.1)
Sodium: 136 mEq/L (ref 135–145)
Total Bilirubin: 0.5 mg/dL (ref 0.2–1.2)
Total Protein: 6.9 g/dL (ref 6.0–8.3)

## 2020-07-31 LAB — VITAMIN D 25 HYDROXY (VIT D DEFICIENCY, FRACTURES): VITD: 25.43 ng/mL — ABNORMAL LOW (ref 30.00–100.00)

## 2020-07-31 LAB — CBC WITH DIFFERENTIAL/PLATELET
Basophils Absolute: 0.1 10*3/uL (ref 0.0–0.1)
Basophils Relative: 0.8 % (ref 0.0–3.0)
Eosinophils Absolute: 0.1 10*3/uL (ref 0.0–0.7)
Eosinophils Relative: 1.7 % (ref 0.0–5.0)
HCT: 39.9 % (ref 36.0–46.0)
Hemoglobin: 13.6 g/dL (ref 12.0–15.0)
Lymphocytes Relative: 29.3 % (ref 12.0–46.0)
Lymphs Abs: 1.9 10*3/uL (ref 0.7–4.0)
MCHC: 34 g/dL (ref 30.0–36.0)
MCV: 95.1 fl (ref 78.0–100.0)
Monocytes Absolute: 0.6 10*3/uL (ref 0.1–1.0)
Monocytes Relative: 9 % (ref 3.0–12.0)
Neutro Abs: 3.8 10*3/uL (ref 1.4–7.7)
Neutrophils Relative %: 59.2 % (ref 43.0–77.0)
Platelets: 225 10*3/uL (ref 150.0–400.0)
RBC: 4.2 Mil/uL (ref 3.87–5.11)
RDW: 12.9 % (ref 11.5–15.5)
WBC: 6.4 10*3/uL (ref 4.0–10.5)

## 2020-07-31 LAB — LIPID PANEL
Cholesterol: 277 mg/dL — ABNORMAL HIGH (ref 0–200)
HDL: 63.5 mg/dL (ref >39.00)
LDL Cholesterol: 184 mg/dL — ABNORMAL HIGH (ref 0–99)
NonHDL: 213.08
Total CHOL/HDL Ratio: 4
Triglycerides: 144 mg/dL (ref 0.0–149.0)
VLDL: 28.8 mg/dL (ref 0.0–40.0)

## 2020-07-31 LAB — LIPASE: Lipase: 23 U/L (ref 11.0–59.0)

## 2020-07-31 NOTE — Addendum Note (Signed)
Addended by: Loura Back on: 07/31/2020 09:26 AM   Modules accepted: Orders

## 2020-07-31 NOTE — Patient Instructions (Addendum)
Please check with your pharmacy to see if they have the shingrix vaccine. If they do- please get this immunization and update [place] by phone call or mychart with dates you receive the vaccine.  Please schedule a visit with [facility] about osteoporosis.  In regards to your hyperlipidemia, please read aout Zetia.  Please stop by lab before you go If you have mychart- we will send your results within 3 business days of [place] receiving them.  If you do not have mychart- we will call you about results within 5 business days of [place] receiving them.  *please also note that you will see labs on mychart as soon as they post. I will later go in and write notes on them- will say "notes from Dr. Yong Channel"

## 2020-07-31 NOTE — Progress Notes (Signed)
Phone [phone]   Subjective:  Patient presents today for their annual physical. Chief complaint-noted.   See problem oriented charting- Review of Systems  Constitutional:  Positive for malaise/fatigue. Negative for chills and fever.  HENT:  Positive for congestion. Negative for hearing loss and tinnitus.   Eyes:  Negative for blurred vision and double vision.  Respiratory:  Positive for cough. Negative for shortness of breath.   Cardiovascular:  Negative for chest pain and palpitations.  Gastrointestinal:  Negative for heartburn, nausea and vomiting.  Genitourinary:  Negative for dysuria, frequency and urgency.  Musculoskeletal:  Negative for back pain, myalgias and neck pain.  Skin:  Negative for itching and rash.  Neurological:  Positive for headaches. Negative for dizziness.  Endo/Heme/Allergies:  Negative for polydipsia. Bruises/bleeds easily.  Psychiatric/Behavioral:  Positive for depression (working with psychiatry still). Negative for suicidal ideas.    The following were reviewed and entered/updated in epic: Past Medical History:  Diagnosis Date   AICD (automatic cardioverter/defibrillator) present    loop recorder   Anxiety    Anxiety state 12/14/2018   Asthma    Atrial fibrillation ([place])    s/p ablations   Atrial flutter ([place])    COPD (chronic obstructive pulmonary disease) ([place])    Degenerative arthritis    "maybe in my left knee" (12/16/2016)   Depression    Dysrhythmia 2010   A-fib   Frozen shoulder    hx bilaterally; "had to go thru PT; no problem w/them since I learned exercises" (12/16/2016)   GERD (gastroesophageal reflux disease)    Glaucoma    H/O: rheumatic fever 1957   High cholesterol    Migraine    "getting botox now; now only 2-3/month" (12/16/2016)   PONV (postoperative nausea and vomiting)    with Fentanyl,  Dilaudid &  Propofol (12/16/2016)   Patient Active Problem List   Diagnosis Date Noted   Paroxysmal atrial fibrillation [facility]) s/p  ablation november 2018, 2011 previously 12/16/2016    Priority: High   Tobacco abuse 10/03/2013    Priority: High   COPD (chronic obstructive pulmonary disease) ([place]) 02/01/2009    Priority: High   Glaucoma 01/08/2018    Priority: Medium   Primary localized osteoarthritis of left knee 01/08/2018    Priority: Medium   High cholesterol     Priority: Medium   Intractable chronic migraine without aura 05/17/2014    Priority: Medium   Depression, major, single episode, complete remission ([place]) 02/01/2009    Priority: Medium   Asthma 02/01/2009    Priority: Medium   Low vitamin D level 01/08/2018    Priority: Low   Nasal injury 10/20/2016    Priority: Low   S/P ablation of atrial fibrillation 05/16/2013    Priority: Low   Anxiety state 12/14/2018   Past Surgical History:  Procedure Laterality Date   ABDOMINAL HYSTERECTOMY  1990   right ovary removed   [place] N/A 12/16/2016   Procedure: ATRIAL FIBRILLATION ABLATION;  Surgeon: Thompson Grayer, MD;  Location: [facility];  Service: Cardiovascular;  Laterality: N/A;   CATARACT EXTRACTION, BILATERAL     CTI ablation  3/11   ESOPHAGOGASTRODUODENOSCOPY (EGD) WITH PROPOFOL N/A 01/06/2013   Procedure: ESOPHAGOGASTRODUODENOSCOPY (EGD) WITH PROPOFOL;  Surgeon: Cleotis Nipper, MD;  Location: [facility] END[facility];  Service: Endoscopy;  Laterality: N/A;   KNEE ARTHROSCOPY Bilateral 1999-2006   KNEE SURGERY Bilateral [id number]   "open; for congenital deformity"   LOOP RECORDER INSERTION N/A 10/02/2016  Procedure: LOOP RECORDER INSERTION;  Surgeon: Thompson Grayer, MD;  Location: [facility];  Service: Cardiovascular;  Laterality: N/A;   PERIPHERAL VASCULAR INTERVENTION  2/11  & 6/11   RHINOPLASTY  1965   TONSILLECTOMY  as child   TOOTH EXTRACTION  2009   2 teeth extractted   TOTAL KNEE ARTHROPLASTY Left 12/27/2018   Procedure: TOTAL KNEE ARTHROPLASTY;  Surgeon: Elsie Saas, MD;  Location: [facility]  [facility];  Service: Orthopedics;  Laterality: Left;    Family History  Problem Relation Age of Onset   Cancer Mother        renal cell   Migraines Mother    Dementia Father    Heart disease Father        mid 58s   Other Brother        died 72- out for run- no autopsy   Hyperlipidemia Brother    Breast cancer Neg Hx     Medications- reviewed and updated Current Outpatient Medications  Medication Sig Dispense Refill   acetaminophen (TYLENOL) 325 MG tablet Take 325 mg 2 (two) times daily as needed by mouth (pain).      albuterol (VENTOLIN HFA) 108 (90 Base) MCG/ACT inhaler INHALE 2 PUFFS INTO THE LUNGS EVERY 6 HOURS AS NEEDED FOR WHEEZING OR SHORTNESS OF BREATH. 18 g 2   ALPRAZolam (XANAX) 0.5 MG tablet TAKE ONE TABLET BY MOUTH TWICE A DAY AS NEEDED FOR ANXIETY 60 tablet 2   apixaban (ELIQUIS) 5 MG TABS tablet TAKE 1 TABLET BY MOUTH TWICE DAILY 180 tablet 1   BOTOX 200 units SOLR INJECT 200 UNITS  INTRAMUSCULARLY EVERY 3  MONTHS (GIVEN AT MD OFFICE, DISCARD UNUSED PORTION  AFTER FIRST USE) 1 each 3   buPROPion (WELLBUTRIN XL) 300 MG 24 hr tablet TAKE 1 TABLET BY MOUTH EVERY MORNING 90 tablet 2   butalbital-acetaminophen-caffeine (FIORICET) 50-325-40 MG tablet TAKE 1 TABLET BY MOUTH EVERY 6 HOURS AS NEEDED FOR HEADACHE. 90 tablet 1   Diclofenac Sodium 1 % CREA as needed.     dorzolamide-timolol (COSOPT) 22.3-6.8 MG/ML ophthalmic solution PLACE 1 DROP IN BOTH EYES TWO TIMES DAILY 20 mL 4   Hypromellose (ARTIFICIAL TEARS OP) Place 1 drop into both eyes daily as needed (dry eyes).      Probiotic Product (ALIGN PO) Take 4 mg by mouth daily.     promethazine (PHENERGAN) 25 MG tablet Take 1 tablet (25 mg total) by mouth every 6 (six) hours as needed for nausea or vomiting. 20 tablet 1   sertraline (ZOLOFT) 100 MG tablet TAKE 1 TABLET BY MOUTH ONCE A DAY 90 tablet 2   Simethicone (GAS-X PO) Take 2 tablets daily as needed by mouth (gas).     sodium chloride (OCEAN) 0.65 % SOLN nasal spray Place 1  spray as needed into both nostrils for congestion.     SUMAtriptan (IMITREX) 100 MG tablet TAKE 1 TABLET BY MOUTH AT THE ONSET OF A HEADACHE DAILY AS NEEDED 27 tablet 2   ALPRAZolam (XANAX) 0.5 MG tablet TAKE 1 TABLET BY MOUTH TWICE A DAY AS NEEDED FOR ANXIETY (Patient not taking: Reported on 07/31/2020) 60 tablet 2   benzonatate (TESSALON) 100 MG capsule Take 1 capsule (100 mg total) by mouth 2 (two) times daily as needed for cough. (Patient not taking: Reported on 07/31/2020) 20 capsule 0   dorzolamide-timolol (COSOPT) 22.3-6.8 MG/ML ophthalmic solution Place 1 drop 2 (two) times daily into both eyes.   3   dorzolamide-timolol (COSOPT) 22.3-6.8 MG/ML ophthalmic  solution Place 1 drop into both eyes 2 times a day 20 mL 0   Current Facility-Administered Medications  Medication Dose Route Frequency Provider Last Rate Last Admin   botulinum toxin Type A (BOTOX) injection 200 Units  200 Units Intramuscular Once Kathrynn Ducking, MD        Allergies-reviewed and updated Allergies  Allergen Reactions   Dilaudid [Hydromorphone Hcl] Nausea And Vomiting   Fentanyl Nausea And Vomiting   Ciprofloxacin Rash   Erythromycin Rash   Percocet [Oxycodone-Acetaminophen] Nausea And Vomiting    Social History   Social History Narrative   Lives at home w/ husband and  dog(s)   Married 1984. No children.    Retired Therapist, sports   Objective  Objective:  BP 108/62   Pulse 61   Temp 98.2 F (36.8 C) (Temporal)   Ht 5\' 7"  (1.702 m)   Wt 144 lb 6.4 oz (65.5 kg)   SpO2 97%   BMI 22.62 kg/m  Gen: NAD, resting comfortably HEENT: Mucous membranes are moist. Oropharynx normal Neck: no thyromegaly CV: RRR no murmurs rubs or gallops Lungs: CTAB no crackles, wheeze, rhonchi Abdomen: soft/nontender/nondistended/active bowel sounds-. No rebound or guarding.  Ext: no edema Skin: warm, dry Neuro: grossly normal, moves all extremities, PERRLA   Assessment and Plan   70 y.o. female presenting for annual physical.   Health Maintenance counseling: 1. Anticipatory guidance: Patient counseled regarding regular dental exams -q6 months, eye exams - 3x a year with glaucoma,  avoiding smoking and second hand smoke-see below , she is already limiting alcohol to 0 beverage per day-other than rare sips - can trigger migraines 2. Risk factor reduction:  Advised patient of need for regular exercise and diet rich and fruits and vegetables to reduce risk of heart attack and stroke. Exercise- daily walking 20 min-hour most days. Diet-healthy diet (salmon/fish veggies, and fruits).  Wt Readings from Last 3 Encounters:  07/31/20 144 lb 6.4 oz (65.5 kg)  06/25/20 145 lb 3.2 oz (65.9 kg)  06/04/20 148 lb (67.1 kg)  3. Immunizations/screenings/ancillary studies- Shingrix recommended at pharmacy, also discussed Prevnar 20- discussed next year  Immunization History  Administered Date(s) Administered   Influenza, High Dose Seasonal PF 11/08/2013, 10/10/2016, 09/24/2018, 10/06/2019   Influenza,inj,quad, With Preservative 11/07/2014   Influenza-Unspecified 10/12/2016, 10/29/2017, 10/06/2019   PFIZER(Purple Top)SARS-COV-2 Vaccination 02/27/2019, 03/21/2019, 11/04/2019, 06/12/2020   Pneumococcal Conjugate-13 03/27/2016   Pneumococcal Polysaccharide-23 12/20/2013   Tdap 02/02/2013   4. Cervical cancer screening- follows with Laurin Coder- bimanual exams continue-past age based screening for Pap smears 5. Breast cancer screening-  breast exam with [place] and mammogram -3d 09/28/2019 up to date  6. Colon cancer screening - 05/23/2013 with 10-year repeat planned previously- has one scheduled July 12th due to some GI issues- no blood. Foods can go right through her- working with Dr. Cristina Gong 7. Skin cancer screening- follows with dermatology yearly . advised regular sunscreen use. Denies worrisome, changing, or new skin lesions.  8. Birth control/STD check- monogamous and postmenopausal 9. Osteoporosis screening at 60- osteoporosis  was noted in the past with T score -3.2 in 09/2018- patient was told to delay treatment  for 8 months after a knee surgery. She was considering seeing someone at [facility] that focuses on osteoporosis- she plans to schedule a visit -current smoker- urinalysis today to evaluate for hematuria.  She was enrolled in lung cancer screening program-last CT 02/14/2020. 1/2 PPD to 15 a day in the past-last year was down  to 5 cigarettes/day--we recommended cessation today- she has been able to stay 5-7 per day.   Status of chronic or acute concerns   # covid 19- currently recovering- still with some sniffles and fatigue  # COPD/asthma/allergies S: Maintenance medications: has tried twice without help.  Currently no medications from pulmonary perspective. 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 In last week only once No oxygen at home  A/P: doing reasonably well-continue to monitor  # Atrial fibrillation-Loop recorder in place- considering replacement with Dr. Rayann Heman  S: Rate controlled with no medication. Has Cardizem on hand in case goes into atrial fibrillation in past- not currently and does not want refill today Anticoagulated with Eliquis 5 mg twice daily Patient is followed by cardiology: Dr. Rayann Heman  A/P: Stable. Continue current medications.    # Depression/anxiety -follows with psychiatric nurse practitioner and after I would not talk to her about everything on her list S: Medication: Sertraline 100 mg, bupropion 300Mg , Xanax 0.5Mg  A/P: reasonably well controlled- continue current meds  #Vitamin D deficiency S: Medication:  none at present -Patient reports aching in her toes on vitamin D in the past - was ok last year A/P: hopefully stable- update vitamin d today.      #hyperlipidemia/aortic atherosclerosis-not accounting for aortic atherosclerosis 10-year ASCVD risk above 15% in 2020 S: Brother with significant myalgias on statins and patient is concerned about trialing-she had migraines on rosuvastatin after a few doses Medication:has not tolerated  Lab Results  Component Value Date   CHOL 252 (H) 07/11/2019   HDL 61.70 07/11/2019   LDLCALC 163 (H) 07/11/2019   TRIG 137.0 07/11/2019   CHOLHDL 4 07/11/2019  A/P: we will update lipid panel today- she wants to focus on healthy eating/regular exercise due to side effects of statin.  - she wants to read about zetia and think this over  # Headaches-follows closely with Dr. Jannifer Franklin. fiorcet or sumatriptan prn. botox injections. Phenergan for nausea portion   #Urgency and diarrhea at times- No blood in stool or melena. Continued issuessince 2014. Is up-to-date on colonoscopy- as above planned update soon  -also RUQ pain last year- requests amylase and lipase- ultrasound was reassuring- update today  Recommended follow up: No follow-ups on file. Future Appointments  Date Time Provider [place]  10/01/2020 12:00 PM Kathrynn Ducking, MD [facility] None  11/05/2020  2:00 PM Thompson Grayer, MD [facility]  05/03/2021  1:00 PM [facility] HEALTH COACH [facility]  06/05/2021 10:45 AM Suzzanne Cloud, NP [facility] None   Lab/Order associations: fasting   ICD-10-CM   1. Preventative health care  Z00.00     2. Low vitamin D level  R79.89     3. Chronic obstructive pulmonary disease, unspecified COPD type ([place])  J44.9     4. Mild intermittent asthma without complication  H60.73     5. Asthma due to seasonal allergies  J45.909     6. Depression, major, single episode, complete remission ([place])  F32.5     7. Anxiety state  F41.1     8. Hyperlipidemia, unspecified hyperlipidemia type  E78.5     9. Aortic atherosclerosis ([facility])  I70.0       No orders of the defined types were placed in  this encounter.  I,Harris  Phan,acting as a scribe for Garret Reddish, MD.,have documented all relevant documentation on the behalf of Garret Reddish, MD,as directed by  Garret Reddish, MD while in the presence of Garret Reddish, MD.  I, Garret Reddish, MD, have reviewed all documentation for this visit. The documentation on 07/31/20 for the exam, diagnosis, procedures, and orders are all accurate and complete.   Return precautions advised.  Garret Reddish, MD

## 2020-08-02 ENCOUNTER — Other Ambulatory Visit (HOSPITAL_COMMUNITY): Payer: Self-pay

## 2020-08-02 ENCOUNTER — Encounter: Payer: Self-pay | Admitting: Neurology

## 2020-08-02 ENCOUNTER — Other Ambulatory Visit: Payer: Self-pay | Admitting: Neurology

## 2020-08-02 MED ORDER — BUTALBITAL-APAP-CAFFEINE 50-325-40 MG PO TABS
ORAL_TABLET | ORAL | 0 refills | Status: DC
  Filled 2020-08-02: qty 90, 22d supply, fill #0

## 2020-08-02 NOTE — Telephone Encounter (Signed)
Not sure what is going on, the prescription for Fioricet was called in by Dr. Leonie Man in January 2022, the controlled substance review shows that the prescription was picked up in April 2022.  The last prescription that we have written through this office was on 05 March 2020.  I will go ahead and refill the prescription.

## 2020-08-02 NOTE — Telephone Encounter (Signed)
Suzanne [name], she got Fioricet 90 tablets on 05/25/20, I think this is too early to refill. Fioricet likely causing rebound headache.

## 2020-08-02 NOTE — Addendum Note (Signed)
Addended by: Kathrynn Ducking on: 08/02/2020 05:23 PM   Modules accepted: Orders

## 2020-08-03 ENCOUNTER — Other Ambulatory Visit (HOSPITAL_COMMUNITY): Payer: Self-pay

## 2020-08-07 ENCOUNTER — Other Ambulatory Visit (HOSPITAL_COMMUNITY): Payer: Self-pay

## 2020-08-08 ENCOUNTER — Other Ambulatory Visit (HOSPITAL_COMMUNITY): Payer: Self-pay

## 2020-08-08 MED ORDER — PEG 3350-KCL-NA BICARB-NACL 420 G PO SOLR
ORAL | 0 refills | Status: DC
  Filled 2020-08-08: qty 4000, 1d supply, fill #0

## 2020-08-14 ENCOUNTER — Encounter: Payer: Self-pay | Admitting: Family Medicine

## 2020-08-14 DIAGNOSIS — K529 Noninfective gastroenteritis and colitis, unspecified: Secondary | ICD-10-CM | POA: Diagnosis not present

## 2020-08-14 DIAGNOSIS — R197 Diarrhea, unspecified: Secondary | ICD-10-CM | POA: Diagnosis not present

## 2020-08-14 DIAGNOSIS — K5730 Diverticulosis of large intestine without perforation or abscess without bleeding: Secondary | ICD-10-CM | POA: Diagnosis not present

## 2020-08-14 DIAGNOSIS — K635 Polyp of colon: Secondary | ICD-10-CM | POA: Diagnosis not present

## 2020-08-14 LAB — HM COLONOSCOPY

## 2020-08-17 DIAGNOSIS — K635 Polyp of colon: Secondary | ICD-10-CM | POA: Diagnosis not present

## 2020-08-28 ENCOUNTER — Other Ambulatory Visit: Payer: Self-pay | Admitting: Obstetrics and Gynecology

## 2020-08-28 DIAGNOSIS — Z1231 Encounter for screening mammogram for malignant neoplasm of breast: Secondary | ICD-10-CM

## 2020-09-01 ENCOUNTER — Encounter (HOSPITAL_BASED_OUTPATIENT_CLINIC_OR_DEPARTMENT_OTHER): Payer: Self-pay | Admitting: Emergency Medicine

## 2020-09-01 ENCOUNTER — Other Ambulatory Visit: Payer: Self-pay

## 2020-09-01 DIAGNOSIS — S61211A Laceration without foreign body of left index finger without damage to nail, initial encounter: Secondary | ICD-10-CM | POA: Diagnosis not present

## 2020-09-01 DIAGNOSIS — J45909 Unspecified asthma, uncomplicated: Secondary | ICD-10-CM | POA: Diagnosis not present

## 2020-09-01 DIAGNOSIS — Z7901 Long term (current) use of anticoagulants: Secondary | ICD-10-CM | POA: Insufficient documentation

## 2020-09-01 DIAGNOSIS — W260XXA Contact with knife, initial encounter: Secondary | ICD-10-CM | POA: Insufficient documentation

## 2020-09-01 DIAGNOSIS — I480 Paroxysmal atrial fibrillation: Secondary | ICD-10-CM | POA: Insufficient documentation

## 2020-09-01 DIAGNOSIS — Z23 Encounter for immunization: Secondary | ICD-10-CM | POA: Diagnosis not present

## 2020-09-01 DIAGNOSIS — F17210 Nicotine dependence, cigarettes, uncomplicated: Secondary | ICD-10-CM | POA: Insufficient documentation

## 2020-09-01 DIAGNOSIS — J449 Chronic obstructive pulmonary disease, unspecified: Secondary | ICD-10-CM | POA: Diagnosis not present

## 2020-09-01 DIAGNOSIS — Y9389 Activity, other specified: Secondary | ICD-10-CM | POA: Insufficient documentation

## 2020-09-01 DIAGNOSIS — S6992XA Unspecified injury of left wrist, hand and finger(s), initial encounter: Secondary | ICD-10-CM | POA: Diagnosis present

## 2020-09-01 DIAGNOSIS — Z96652 Presence of left artificial knee joint: Secondary | ICD-10-CM | POA: Insufficient documentation

## 2020-09-01 NOTE — ED Triage Notes (Addendum)
Pt presents to ED POV. Pt c/o lac to L pointer finger. Pt reports happened with knife wihle making dinner. Bleeding controlled. Taking eliquis. UTD on tetanus.

## 2020-09-02 ENCOUNTER — Emergency Department (HOSPITAL_BASED_OUTPATIENT_CLINIC_OR_DEPARTMENT_OTHER)
Admission: EM | Admit: 2020-09-02 | Discharge: 2020-09-02 | Disposition: A | Discharge status: Home / Self Care | Payer: Medicare PPO | Attending: Emergency Medicine | Admitting: Emergency Medicine

## 2020-09-02 DIAGNOSIS — S61211A Laceration without foreign body of left index finger without damage to nail, initial encounter: Secondary | ICD-10-CM

## 2020-09-02 MED ORDER — TETANUS-DIPHTH-ACELL PERTUSSIS 5-2.5-18.5 LF-MCG/0.5 IM SUSY
0.5000 mL | PREFILLED_SYRINGE | Freq: Once | INTRAMUSCULAR | Status: AC
  Administered 2020-09-02: 0.5 mL via INTRAMUSCULAR
  Filled 2020-09-02: qty 0.5

## 2020-09-02 MED ORDER — CEPHALEXIN 500 MG PO CAPS: 500.0000 mg | ORAL_CAPSULE | Freq: Three times a day (TID) | ORAL | 0 refills | Status: AC

## 2020-09-02 NOTE — ED Provider Notes (Signed)
[facility] [facility] EMERGENCY DEPT Provider Note  CSN: [id number] Arrival date & time: 09/01/20 2109  Chief Complaint(s) Laceration  HPI Suzanne [name] is a 70 y.o. female    Laceration Location:  Finger Finger laceration location:  L index finger Quality: straight   Bleeding: controlled   Laceration mechanism:  Knife Pain details:    Quality:  Aching   Severity:  Mild   Timing:  Constant Relieved by:  Nothing Worsened by:  Pressure Tetanus status: 52yr ago. Associated symptoms: no fever, no redness, no swelling and no streaking    Past Medical History Past Medical History:  Diagnosis Date   AICD (automatic cardioverter/defibrillator) present    loop recorder   Anxiety    Anxiety state 12/14/2018   Asthma    Atrial fibrillation (HCC)    s/p ablations   Atrial flutter (HCC)    COPD (chronic obstructive pulmonary disease) ([facility]    Degenerative arthritis    "maybe in my left knee" (12/16/2016)   Depression    Dysrhythmia 2010   A-fib   Frozen shoulder    hx bilaterally; "had to go thru PT; no problem w/them since I learned exercises" (12/16/2016)   GERD (gastroesophageal reflux disease)    Glaucoma    H/O: rheumatic fever 1957   High cholesterol    Migraine    "getting botox now; now only 2-3/month" (12/16/2016)   PONV (postoperative nausea and vomiting)    with Fentanyl,  Dilaudid &  Propofol (12/16/2016)   Patient Active Problem List   Diagnosis Date Noted   Anxiety state 12/14/2018   Glaucoma 01/08/2018   Primary localized osteoarthritis of left knee 01/08/2018   Low vitamin D level 01/08/2018   High cholesterol    Paroxysmal atrial fibrillation ([facility] s/p ablation november 2018, 2011 previously 12/16/2016   Nasal injury 10/20/2016   Intractable chronic migraine without aura 05/17/2014   Tobacco abuse 10/03/2013   S/P ablation of atrial fibrillation 05/16/2013   Depression, major, single episode, complete remission ([facility] 02/01/2009   Asthma  02/01/2009   COPD (chronic obstructive pulmonary disease) ([facility] 02/01/2009   Home Medication(s) Prior to Admission medications   Medication Sig Start Date End Date Taking? Authorizing Provider  acetaminophen (TYLENOL) 325 MG tablet Take 325 mg 2 (two) times daily as needed by mouth (pain).     [provider]  albuterol (VENTOLIN HFA) 108 (90 Base) MCG/ACT inhaler INHALE 2 PUFFS INTO THE LUNGS EVERY 6 HOURS AS NEEDED FOR WHEEZING OR SHORTNESS OF BREATH. 02/01/20 01/31/21  HMarin Olp MD  apixaban (ELIQUIS) 5 MG TABS tablet TAKE 1 TABLET BY MOUTH TWICE DAILY 06/14/20 06/14/21  Allred, JJeneen Rinks MD  BOTOX 200 units SOLR INJECT 200 UNITS  INTRAMUSCULARLY EVERY 3  MONTHS (GIVEN AT MD OFFICE, DISCARD UNUSED PORTION  AFTER FIRST USE) 09/16/18   WKathrynn Ducking MD  buPROPion (WELLBUTRIN XL) 300 MG 24 hr tablet TAKE 1 TABLET BY MOUTH EVERY MORNING 06/26/20     butalbital-acetaminophen-caffeine (FIORICET) 50-325-40 MG tablet TAKE 1 TABLET BY MOUTH EVERY 6 HOURS AS NEEDED FOR HEADACHE. 08/02/20 08/02/21  WKathrynn Ducking MD  Diclofenac Sodium 1 % CREA as needed. 09/27/19   [provider]  dorzolamide-timolol (COSOPT) 22.3-6.8 MG/ML ophthalmic solution PLACE 1 DROP IN BOTH EYES TWO TIMES DAILY 06/11/20     Hypromellose (ARTIFICIAL TEARS OP) Place 1 drop into both eyes daily as needed (dry eyes).     [provider]  polyethylene glycol-electrolytes (NULYTELY) 420 g solution Use as  directed 06/13/20   Buccini, Herbie [place], MD  Probiotic Product (ALIGN PO) Take 4 mg by mouth daily.    [provider]  promethazine (PHENERGAN) 25 MG tablet Take 1 tablet (25 mg total) by mouth every 6 (six) hours as needed for nausea or vomiting. 06/04/20   Suzzanne Cloud, NP  sertraline (ZOLOFT) 100 MG tablet TAKE 1 TABLET BY MOUTH ONCE A DAY 06/26/20     Simethicone (GAS-X PO) Take 2 tablets daily as needed by mouth (gas).    [provider]  sodium chloride (OCEAN) 0.65 % SOLN nasal spray  Place 1 spray as needed into both nostrils for congestion.    [provider]  SUMAtriptan (IMITREX) 100 MG tablet TAKE 1 TABLET BY MOUTH AT THE ONSET OF A HEADACHE DAILY AS NEEDED 06/04/20   Suzzanne Cloud, NP                                                                                                                                    Past Surgical History Past Surgical History:  Procedure Laterality Date   ABDOMINAL HYSTERECTOMY  1990   right ovary removed   [place] N/A 12/16/2016   Procedure: ATRIAL FIBRILLATION ABLATION;  Surgeon: Thompson Grayer, MD;  Location: [facility];  Service: Cardiovascular;  Laterality: N/A;   CATARACT EXTRACTION, BILATERAL     CTI ablation  3/11   ESOPHAGOGASTRODUODENOSCOPY (EGD) WITH PROPOFOL N/A 01/06/2013   Procedure: ESOPHAGOGASTRODUODENOSCOPY (EGD) WITH PROPOFOL;  Surgeon: Cleotis Nipper, MD;  Location: [facility];  Service: Endoscopy;  Laterality: N/A;   KNEE ARTHROSCOPY Bilateral 1999-2006   KNEE SURGERY Bilateral [id number]   "open; for congenital deformity"   LOOP RECORDER INSERTION N/A 10/02/2016   Procedure: LOOP RECORDER INSERTION;  Surgeon: Thompson Grayer, MD;  Location: [facility];  Service: Cardiovascular;  Laterality: N/A;   PERIPHERAL VASCULAR INTERVENTION  2/11  & 6/11   RHINOPLASTY  1965   TONSILLECTOMY  as child   TOOTH EXTRACTION  2009   2 teeth extractted   TOTAL KNEE ARTHROPLASTY Left 12/27/2018   Procedure: TOTAL KNEE ARTHROPLASTY;  Surgeon: Elsie Saas, MD;  Location: [facility];  Service: Orthopedics;  Laterality: Left;   Family History Family History  Problem Relation Age of Onset   Cancer Mother        renal cell   Migraines Mother    Dementia Father    Heart disease Father        mid 60s   Other Brother        died 64- out for run- no autopsy   Hyperlipidemia Brother    Breast cancer Neg Hx     Social History Social History   Tobacco Use    Smoking status: Every Day    Packs/day: 0.25    Years: 50.00    Pack years: 12.50  Types: Cigarettes   Smokeless tobacco: Never   Tobacco comments:    Discussed reduce to quit, gave card with resources to help in the community  Vaping Use   Vaping Use: Never used  Substance Use Topics   Alcohol use: Yes    Comment: 12/16/2016 "1-2 glasses of wine per year"   Drug use: No   Allergies Dilaudid [hydromorphone hcl], Fentanyl, Ciprofloxacin, Erythromycin, and Percocet [oxycodone-acetaminophen]  Review of Systems Review of Systems  Constitutional:  Negative for fever.  All other systems are reviewed and are negative for acute change except as noted in the HPI  Physical Exam Vital Signs  I have reviewed the triage vital signs BP (!) 159/76   Pulse 73   Temp 98 F (36.7 C) (Oral)   Resp 20   Ht '5\' 7"'$  (1.702 m)   Wt 67.6 kg   SpO2 100%   BMI 23.34 kg/m   Physical Exam Vitals reviewed.  Constitutional:      General: She is not in acute distress.    Appearance: She is well-developed. She is not diaphoretic.  HENT:     Head: Normocephalic and atraumatic.     Right Ear: External ear normal.     Left Ear: External ear normal.     Nose: Nose normal.  Eyes:     General: No scleral icterus.    Conjunctiva/sclera: Conjunctivae normal.  Neck:     Trachea: Phonation normal.  Cardiovascular:     Rate and Rhythm: Normal rate and regular rhythm.  Pulmonary:     Effort: Pulmonary effort is normal. No respiratory distress.     Breath sounds: No stridor.  Abdominal:     General: There is no distension.  Musculoskeletal:        General: Normal range of motion.     Left hand: Laceration (approx 1.5cm. hemostatic.) present.       Hands:     Cervical back: Normal range of motion.  Neurological:     Mental Status: She is alert and oriented to person, place, and time.  Psychiatric:        Behavior: Behavior normal.    ED Results and Treatments Labs (all labs ordered are  listed, but only abnormal results are displayed) Labs Reviewed - No data to display                                                                                                                       EKG  EKG Interpretation  Date/Time:    Ventricular Rate:    PR Interval:    QRS Duration:   QT Interval:    QTC Calculation:   R Axis:     Text Interpretation:         Radiology No results found.  Pertinent labs & imaging results that were available during my care of the patient were reviewed by me and considered in my medical decision making (see chart for details).  Medications Ordered in ED Medications -  No data to display                                                                                                                                  Procedures Procedures  (including critical care time)  Medical Decision Making / ED Course I have reviewed the nursing notes for this encounter and the patient's prior records (if available in EHR or on provided paperwork).   Malani Cresswell Pinon was evaluated in Emergency Department on 09/02/2020 for the symptoms described in the history of present illness. She was evaluated in the context of the global COVID-19 pandemic, which necessitated consideration that the patient might be at risk for infection with the SARS-CoV-2 virus that causes COVID-19. Institutional protocols and algorithms that pertain to the evaluation of patients at risk for COVID-19 are in a state of rapid change based on information released by regulatory bodies including the CDC and federal and state organizations. These policies and algorithms were followed during the patient's care in the ED.  Finger laceration. On AG, but bleeding controlled. Patient already irrigated at home. Wound is well appearing and approximated. No need for primary closure. Tdap booster given. Wound bandaged. Rx for abx provided in case wound becomes infected.      Final Clinical  Impression(s) / ED Diagnoses Final diagnoses:  None   The patient appears reasonably screened and/or stabilized for discharge and I doubt any other medical condition or other [facility] requiring further screening, evaluation, or treatment in the ED at this time prior to discharge. Safe for discharge with strict return precautions.  Disposition: Discharge  Condition: Good  I have discussed the results, Dx and Tx plan with the patient/family who expressed understanding and agree(s) with the plan. Discharge instructions discussed at length. The patient/family was given strict return precautions who verbalized understanding of the instructions. No further questions at time of discharge.    ED Discharge Orders          Ordered    cephALEXin (KEFLEX) 500 MG capsule  3 times daily        09/02/20 0141              Follow Up: Marin Olp, MD [place] [id number] [phone]  Call  as needed      This chart was dictated using voice recognition software.  Despite best efforts to proofread,  errors can occur which can change the documentation meaning.    Fatima Blank, MD 09/02/20 [phone]

## 2020-09-02 NOTE — ED Notes (Signed)
Pt verbalizes understanding of discharge instructions. Opportunity for questioning and answers were provided. Armand removed by staff, pt discharged from ED to home. Educated to pick up Rx.  

## 2020-09-03 ENCOUNTER — Telehealth: Payer: Self-pay | Admitting: *Deleted

## 2020-09-03 NOTE — Telephone Encounter (Signed)
Clinical pharmacist to review Eliquis 

## 2020-09-03 NOTE — Telephone Encounter (Signed)
Patient with diagnosis of atrial fibrillation on Eliquis for anticoagulation.    Procedure: B/L UPPER EYELID BLEPHAROPTOSIS REPAIR WITH LESION EXCISION AND REPAIR LEFT UPPER EYELID Date of procedure: 09/24/20   CHA2DS2-VASc Score = 2  This indicates a 2.2% annual risk of stroke. The patient's score is based upon: CHF History: No HTN History: No Diabetes History: No Stroke History: No Vascular Disease History: No Age Score: 1 Gender Score: 1      CrCl 72 Platelet count 225  Per office protocol, patient can hold Eliquis for 2 days prior to procedure.   Patient will not need bridging with Lovenox (enoxaparin) around procedure.  Patient should restart Eliquis on the evening of procedure or day after, at discretion of procedure MD

## 2020-09-03 NOTE — Telephone Encounter (Signed)
   [facility] Pre-operative Risk Assessment    Patient Name: Suzanne [name]  DOB: 1950/08/04 MRN: [id number]  HEARTCARE STAFF:  - IMPORTANT!!!!!! Under Visit Info/Reason for Call, type in Other and utilize the format Clearance MM/DD/YY or Clearance TBD. Do not use dashes or single digits. - Please review there is not already an duplicate clearance open for this procedure. - If request is for dental extraction, please clarify the # of teeth to be extracted. - If the patient is currently at the dentist's office, call Pre-Op Callback Staff (MA/nurse) to input urgent request.  - If the patient is not currently in the dentist office, please route to the Pre-Op pool.  Request for surgical clearance:  What type of surgery is being performed? B/L UPPER EYELID BLEPHAROPTOSIS REPAIR WITH LESION EXCISION AND REPAIR LEFT UPPER EYELID  When is this surgery scheduled? 09/24/20  What type of clearance is required (medical clearance vs. Pharmacy clearance to hold med vs. Both)? BOTH  Are there any medications that need to be held prior to surgery and how long? Ramona name and name of physician performing surgery? [facility]; DR. Isidoro Donning  What is the office phone number? [phone]   7.   What is the office fax number? [phone]  8.   Anesthesia type (None, local, MAC, general) ? MAC   Julaine Hua 09/03/2020, 1:08 PM  _________________________________________________________________   (provider comments below)

## 2020-09-04 NOTE — Telephone Encounter (Signed)
Left message for the patient to call back and speak to the on-call preop APP of the day 

## 2020-09-10 NOTE — Telephone Encounter (Signed)
Primary Cardiologist:James Allred, MD  Chart reviewed as part of pre-operative protocol coverage. Because of Padma Herkert Pagaduan's past medical history and time since last visit, he/she will require a follow-up visit in order to better assess preoperative cardiovascular risk.  Pre-op covering staff: - Please schedule appointment and call patient to inform them. - Please contact requesting surgeon's office via preferred method (i.e, phone, fax) to inform them of need for appointment prior to surgery.  If applicable, this message will also be routed to pharmacy pool and/or primary cardiologist for input on holding anticoagulant/antiplatelet agent as requested below so that this information is available at time of patient's appointment.   Deberah Pelton, NP  09/10/2020, 1:24 PM

## 2020-09-10 NOTE — Telephone Encounter (Signed)
1st attempt to reach patient to schedule sooner appointment. Pt is already scheduled to see Dr. Rayann Heman on 10/3 but procedure is scheduled for 8/22. Lvm with detailed message.

## 2020-09-11 ENCOUNTER — Other Ambulatory Visit (HOSPITAL_COMMUNITY): Payer: Self-pay

## 2020-09-11 NOTE — Telephone Encounter (Signed)
2nd attempt to reach patient was unsuccessful.

## 2020-09-12 NOTE — Telephone Encounter (Signed)
3rd attempt to reach patient unsuccessful. Left another VM for the patient. Will route to requesting surgeons office via the epic fax function to make them aware.

## 2020-09-14 ENCOUNTER — Other Ambulatory Visit (HOSPITAL_COMMUNITY): Payer: Self-pay

## 2020-09-17 ENCOUNTER — Encounter: Payer: Self-pay | Admitting: Family Medicine

## 2020-09-17 ENCOUNTER — Other Ambulatory Visit (HOSPITAL_COMMUNITY): Payer: Self-pay

## 2020-09-17 DIAGNOSIS — K591 Functional diarrhea: Secondary | ICD-10-CM | POA: Diagnosis not present

## 2020-09-17 DIAGNOSIS — R198 Other specified symptoms and signs involving the digestive system and abdomen: Secondary | ICD-10-CM | POA: Diagnosis not present

## 2020-09-18 ENCOUNTER — Other Ambulatory Visit (HOSPITAL_COMMUNITY): Payer: Self-pay

## 2020-09-18 DIAGNOSIS — F331 Major depressive disorder, recurrent, moderate: Secondary | ICD-10-CM | POA: Diagnosis not present

## 2020-09-18 DIAGNOSIS — F411 Generalized anxiety disorder: Secondary | ICD-10-CM | POA: Diagnosis not present

## 2020-09-18 MED ORDER — ALPRAZOLAM 0.5 MG PO TABS
ORAL_TABLET | ORAL | 2 refills | Status: DC
  Filled 2020-09-18: qty 60, 30d supply, fill #0
  Filled 2020-10-22: qty 60, 30d supply, fill #1

## 2020-10-01 ENCOUNTER — Encounter: Payer: Self-pay | Admitting: Neurology

## 2020-10-01 ENCOUNTER — Ambulatory Visit: Payer: Medicare PPO | Admitting: Neurology

## 2020-10-01 ENCOUNTER — Other Ambulatory Visit: Payer: Self-pay

## 2020-10-01 VITALS — BP 116/67 | HR 65

## 2020-10-01 DIAGNOSIS — G43719 Chronic migraine without aura, intractable, without status migrainosus: Secondary | ICD-10-CM

## 2020-10-01 MED ORDER — ONABOTULINUMTOXINA 100 UNITS IJ SOLR
200.0000 [IU] | Freq: Once | INTRAMUSCULAR | Status: AC
  Administered 2020-10-01: 200 [IU] via INTRAMUSCULAR

## 2020-10-01 NOTE — Procedures (Signed)
     BOTOX PROCEDURE NOTE FOR MIGRAINE HEADACHE   HISTORY: Suzanne [name] is a 70 year old patient with a history of intractable migraine headache.  She has gotten benefit from Botox injections in the past, she recently COVID and had an increase in headache frequency around that time.  She has now recovered from the COVID infection.  She comes in for another Botox treatment.   Description of procedure:  The patient was placed in a sitting position. The standard protocol was used for Botox as follows, with 5 units of Botox injected at each site:   -Procerus muscle, midline injection  -Corrugator muscle, bilateral injection  -Frontalis muscle, bilateral injection, with 2 sites each side, medial injection was performed in the upper one third of the frontalis muscle, in the region vertical from the medial inferior edge of the superior orbital rim. The lateral injection was again in the upper one third of the forehead vertically above the lateral limbus of the cornea, 1.5 cm lateral to the medial injection site.  -Temporalis muscle injection, 4 sites, bilaterally. The first injection was 3 cm above the tragus of the ear, second injection site was 1.5 cm to 3 cm up from the first injection site in line with the tragus of the ear. The third injection site was 1.5-3 cm forward between the first 2 injection sites. The fourth injection site was 1.5 cm posterior to the second injection site.  -Occipitalis muscle injection, 3 sites, bilaterally. The first injection was done one half way between the occipital protuberance and the tip of the mastoid process behind the ear. The second injection site was done lateral and superior to the first, 1 fingerbreadth from the first injection. The third injection site was 1 fingerbreadth superiorly and medially from the first injection site.  -Cervical paraspinal muscle injection, 2 sites, bilateral, the first injection site was 1 cm from the midline of the cervical  spine, 3 cm inferior to the lower border of the occipital protuberance. The second injection site was 1.5 cm superiorly and laterally to the first injection site.  -Trapezius muscle injection was performed at 3 sites, bilaterally. The first injection site was in the upper trapezius muscle halfway between the inflection point of the neck, and the acromion. The second injection site was one half way between the acromion and the first injection site. The third injection was done between the first injection site and the inflection point of the neck.   A 200 unit bottle of Botox was used, 155 units were injected, the rest of the Botox was wasted. The patient tolerated the procedure well, there were no complications of the above procedure.  Botox NDC [id number] Lot number [id number] Expiration date September 2023

## 2020-10-01 NOTE — Progress Notes (Signed)
Please refer to Botox procedure note. 

## 2020-10-02 ENCOUNTER — Other Ambulatory Visit (HOSPITAL_COMMUNITY): Payer: Self-pay

## 2020-10-04 ENCOUNTER — Other Ambulatory Visit (HOSPITAL_COMMUNITY): Payer: Self-pay

## 2020-10-04 ENCOUNTER — Encounter: Payer: Self-pay | Admitting: Family Medicine

## 2020-10-04 ENCOUNTER — Telehealth (INDEPENDENT_AMBULATORY_CARE_PROVIDER_SITE_OTHER): Payer: Medicare PPO | Admitting: Family Medicine

## 2020-10-04 VITALS — BP 116/72 | Wt 142.2 lb

## 2020-10-04 DIAGNOSIS — H5789 Other specified disorders of eye and adnexa: Secondary | ICD-10-CM | POA: Diagnosis not present

## 2020-10-04 MED ORDER — POLYMYXIN B-TRIMETHOPRIM 10000-0.1 UNIT/ML-% OP SOLN
1.0000 [drp] | OPHTHALMIC | 0 refills | Status: DC
  Filled 2020-10-04: qty 10, 16d supply, fill #0

## 2020-10-04 NOTE — Patient Instructions (Signed)
-  I sent the medication(s) we discussed to your pharmacy: Meds ordered this encounter  Medications   trimethoprim-polymyxin b (POLYTRIM) ophthalmic solution    Sig: Place 1 drop into both eyes every 4 (four) hours.    Dispense:  10 mL    Refill:  0     I hope you are feeling better soon!  Seek in person care promptly if your symptoms worsen, new concerns arise or you are not improving with treatment.  It was nice to meet you today. I help [facility] out with telemedicine visits on Tuesdays and Thursdays and am available for visits on those days. If you have any concerns or questions following this visit please schedule a follow up visit with your Primary Care doctor or seek care at a local urgent care clinic to avoid delays in care.

## 2020-10-04 NOTE — Progress Notes (Signed)
Virtual Visit via Video Note  I connected with Suzanne [name]  on 10/04/20 at 12:40 PM EDT by a video enabled telemedicine application and verified that I am speaking with the correct person using two identifiers.  Location patient: home, [place] Location provider:work or home office Persons participating in the virtual visit: patient, provider  I discussed the limitations of evaluation and management by telemedicine and the availability of in person appointments. The patient expressed understanding and agreed to proceed.   HPI:  Acute telemedicine visit for ? conjunctivitis: -Onset:over a week ago -Symptoms include: eyes feel gritty and irritated - itchy, red, crusting around the eyelashes, R is worse than L, some drainage -seeing eye doctor soon -Denies: fevers, malaise, eye pain, change in vision -Pertinent past medical history: see below -Pertinent medication allergies:  Allergies  Allergen Reactions   Dilaudid [Hydromorphone Hcl] Nausea And Vomiting   Fentanyl Nausea And Vomiting   Ciprofloxacin Rash   Erythromycin Rash   Percocet [Oxycodone-Acetaminophen] Nausea And Vomiting   ROS: See pertinent positives and negatives per HPI.  Past Medical History:  Diagnosis Date   AICD (automatic cardioverter/defibrillator) present    loop recorder   Anxiety    Anxiety state 12/14/2018   Asthma    Atrial fibrillation (HCC)    s/p ablations   Atrial flutter (HCC)    COPD (chronic obstructive pulmonary disease) ([place])    Degenerative arthritis    "maybe in my left knee" (12/16/2016)   Depression    Dysrhythmia 2010   A-fib   Frozen shoulder    hx bilaterally; "had to go thru PT; no problem w/them since I learned exercises" (12/16/2016)   GERD (gastroesophageal reflux disease)    Glaucoma    H/O: rheumatic fever 1957   High cholesterol    Migraine    "getting botox now; now only 2-3/month" (12/16/2016)   PONV (postoperative nausea and vomiting)    with Fentanyl,  Dilaudid &  Propofol  (12/16/2016)    Past Surgical History:  Procedure Laterality Date   ABDOMINAL HYSTERECTOMY  1990   right ovary removed   [place] N/A 12/16/2016   Procedure: ATRIAL FIBRILLATION ABLATION;  Surgeon: Thompson Grayer, MD;  Location: [facility];  Service: Cardiovascular;  Laterality: N/A;   CATARACT EXTRACTION, BILATERAL     CTI ablation  3/11   ESOPHAGOGASTRODUODENOSCOPY (EGD) WITH PROPOFOL N/A 01/06/2013   Procedure: ESOPHAGOGASTRODUODENOSCOPY (EGD) WITH PROPOFOL;  Surgeon: Cleotis Nipper, MD;  [facility]: [facility];  Service: Endoscopy;  Laterality: N/A;   KNEE ARTHROSCOPY Bilateral 1999-2006   KNEE SURGERY Bilateral [id number]   "open; for congenital deformity"   LOOP RECORDER INSERTION N/A 10/02/2016   Procedure: LOOP RECORDER INSERTION;  Surgeon: Thompson Grayer, MD;  Location: [facility];  Service: Cardiovascular;  Laterality: N/A;   PERIPHERAL VASCULAR INTERVENTION  2/11  & 6/11   RHINOPLASTY  1965   TONSILLECTOMY  as child   TOOTH EXTRACTION  2009   2 teeth extractted   TOTAL KNEE ARTHROPLASTY Left 12/27/2018   Procedure: TOTAL KNEE ARTHROPLASTY;  Surgeon: Elsie Saas, MD;  Location: [facility];  Service: Orthopedics;  Laterality: Left;     Current Outpatient Medications:    acetaminophen (TYLENOL) 325 MG tablet, Take 325 mg 2 (two) times daily as needed by mouth (pain). , Disp: , Rfl:    albuterol (VENTOLIN HFA) 108 (90 Base) MCG/ACT inhaler, INHALE 2 PUFFS INTO THE LUNGS EVERY 6 HOURS AS NEEDED FOR WHEEZING OR  SHORTNESS OF BREATH., Disp: 18 g, Rfl: 2   ALPRAZolam (XANAX) 0.5 MG tablet, TAKE 1 TABLET BY MOUTH TWICE A DAY AS NEEDED FOR ANXIETY, Disp: 60 tablet, Rfl: 2   apixaban (ELIQUIS) 5 MG TABS tablet, TAKE 1 TABLET BY MOUTH TWICE DAILY, Disp: 180 tablet, Rfl: 1   BOTOX 200 units SOLR, INJECT 200 UNITS  INTRAMUSCULARLY EVERY 3  MONTHS (GIVEN AT MD OFFICE, DISCARD UNUSED PORTION  AFTER FIRST USE), Disp: 1 each, Rfl: 3    buPROPion (WELLBUTRIN XL) 300 MG 24 hr tablet, TAKE 1 TABLET BY MOUTH EVERY MORNING, Disp: 90 tablet, Rfl: 2   butalbital-acetaminophen-caffeine (FIORICET) 50-325-40 MG tablet, TAKE 1 TABLET BY MOUTH EVERY 6 HOURS AS NEEDED FOR HEADACHE., Disp: 90 tablet, Rfl: 0   Diclofenac Sodium 1 % CREA, as needed., Disp: , Rfl:    dorzolamide-timolol (COSOPT) 22.3-6.8 MG/ML ophthalmic solution, PLACE 1 DROP IN BOTH EYES TWO TIMES DAILY, Disp: 20 mL, Rfl: 4   Hypromellose (ARTIFICIAL TEARS OP), Place 1 drop into both eyes daily as needed (dry eyes). , Disp: , Rfl:    Probiotic Product (ALIGN PO), Take 4 mg by mouth daily., Disp: , Rfl:    promethazine (PHENERGAN) 25 MG tablet, Take 1 tablet (25 mg total) by mouth every 6 (six) hours as needed for nausea or vomiting., Disp: 20 tablet, Rfl: 1   sertraline (ZOLOFT) 100 MG tablet, TAKE 1 TABLET BY MOUTH ONCE A DAY, Disp: 90 tablet, Rfl: 2   Simethicone (GAS-X PO), Take 2 tablets daily as needed by mouth (gas)., Disp: , Rfl:    sodium chloride (OCEAN) 0.65 % SOLN nasal spray, Place 1 spray as needed into both nostrils for congestion., Disp: , Rfl:    SUMAtriptan (IMITREX) 100 MG tablet, TAKE 1 TABLET BY MOUTH AT THE ONSET OF A HEADACHE DAILY AS NEEDED, Disp: 27 tablet, Rfl: 2   trimethoprim-polymyxin b (POLYTRIM) ophthalmic solution, Place 1 drop into both eyes every 4 (four) hours., Disp: 10 mL, Rfl: 0  Current Facility-Administered Medications:    botulinum toxin Type A (BOTOX) injection 200 Units, 200 Units, Intramuscular, Once, Willis, Elon Alas, MD  EXAM:  VITALS per patient if applicable:  GENERAL: alert, oriented, appears well and in no acute distress  HEENT: atraumatic, conjunttiva mildly injected, no appreciable drainage or edema at this visit, no obvious abnormalities on inspection of external nose and ears  NECK: normal movements of the head and neck  LUNGS: on inspection no signs of respiratory distress, breathing rate appears normal, no obvious  gross SOB, gasping or wheezing  CV: no obvious cyanosis  MS: moves all visible extremities without noticeable abnormality  PSYCH/NEURO: pleasant and cooperative, no obvious depression or anxiety, speech and thought processing grossly intact  ASSESSMENT AND PLAN:  Discussed the following assessment and plan:  Eye irritation  -we discussed possible serious and likely etiologies, options for evaluation and workup, limitations of telemedicine visit vs in person visit, treatment, treatment risks and precautions. Pt is agreeable to treatment via telemedicine at this moment. She opted to try empiric abx eye drops for possible bacterial infection. Agrees to contact her opthomologist if worsening, new symptoms arise, or if is not improving with treatment. Discussed options for inperson care if PCP office not available. Did let this patient know that I only do telemedicine on Tuesdays and Thursdays for [place]. Advised to schedule follow up visit with PCP or [facility] if any further questions or concerns to avoid delays in care.   I discussed the  assessment and treatment plan with the patient. The patient was provided an opportunity to ask questions and all were answered. The patient agreed with the plan and demonstrated an understanding of the instructions.     Lucretia Kern, DO

## 2020-10-09 ENCOUNTER — Other Ambulatory Visit (HOSPITAL_COMMUNITY): Payer: Self-pay

## 2020-10-15 ENCOUNTER — Other Ambulatory Visit (HOSPITAL_COMMUNITY): Payer: Self-pay

## 2020-10-15 DIAGNOSIS — H01001 Unspecified blepharitis right upper eyelid: Secondary | ICD-10-CM | POA: Diagnosis not present

## 2020-10-15 DIAGNOSIS — H01004 Unspecified blepharitis left upper eyelid: Secondary | ICD-10-CM | POA: Diagnosis not present

## 2020-10-15 DIAGNOSIS — H401231 Low-tension glaucoma, bilateral, mild stage: Secondary | ICD-10-CM | POA: Diagnosis not present

## 2020-10-15 MED ORDER — NEOMYCIN-POLYMYXIN-DEXAMETH 3.5-10000-0.1 OP OINT
TOPICAL_OINTMENT | OPHTHALMIC | 1 refills | Status: DC
  Filled 2020-10-15: qty 3.5, 5d supply, fill #0
  Filled 2020-10-16: qty 3.5, 7d supply, fill #0

## 2020-10-16 ENCOUNTER — Other Ambulatory Visit (HOSPITAL_COMMUNITY): Payer: Self-pay

## 2020-10-18 ENCOUNTER — Other Ambulatory Visit: Payer: Self-pay

## 2020-10-18 ENCOUNTER — Ambulatory Visit
Admission: RE | Admit: 2020-10-18 | Discharge: 2020-10-18 | Disposition: A | Discharge status: Home / Self Care | Payer: Medicare PPO | Source: Ambulatory Visit

## 2020-10-18 DIAGNOSIS — Z1231 Encounter for screening mammogram for malignant neoplasm of breast: Secondary | ICD-10-CM

## 2020-10-21 ENCOUNTER — Encounter: Payer: Self-pay | Admitting: Family Medicine

## 2020-10-22 ENCOUNTER — Other Ambulatory Visit (HOSPITAL_COMMUNITY): Payer: Self-pay

## 2020-10-22 MED FILL — Albuterol Sulfate Inhal Aero 108 MCG/ACT (90MCG Base Equiv): RESPIRATORY_TRACT | 25 days supply | Qty: 18 | Fill #1 | Status: AC

## 2020-11-03 ENCOUNTER — Encounter: Payer: Self-pay | Admitting: Family Medicine

## 2020-11-05 ENCOUNTER — Ambulatory Visit: Payer: Medicare PPO | Admitting: Internal Medicine

## 2020-11-05 ENCOUNTER — Encounter: Payer: Self-pay | Admitting: Internal Medicine

## 2020-11-05 ENCOUNTER — Other Ambulatory Visit: Payer: Self-pay

## 2020-11-05 VITALS — BP 110/70 | HR 68 | Ht 67.0 in | Wt 144.8 lb

## 2020-11-05 DIAGNOSIS — I4892 Unspecified atrial flutter: Secondary | ICD-10-CM

## 2020-11-05 DIAGNOSIS — I480 Paroxysmal atrial fibrillation: Secondary | ICD-10-CM

## 2020-11-05 DIAGNOSIS — G4733 Obstructive sleep apnea (adult) (pediatric): Secondary | ICD-10-CM | POA: Diagnosis not present

## 2020-11-05 DIAGNOSIS — E785 Hyperlipidemia, unspecified: Secondary | ICD-10-CM | POA: Diagnosis not present

## 2020-11-05 HISTORY — PX: OTHER SURGICAL HISTORY: SHX169

## 2020-11-05 NOTE — Progress Notes (Signed)
PCP: Marin Olp, MD Primary Cardiologist: Dr Tamala Julian Primary EP: Dr Rayann Heman  Suzanne [name] is a 70 y.o. female who presents today for routine electrophysiology followup.  Since last being seen in our clinic, the patient reports doing very well.   She has rare SOB episodes which she thinks could be afib. Today, she denies symptoms of palpitations, chest pain,  lower extremity edema, dizziness, presyncope, or syncope.  The patient is otherwise without complaint today.   Past Medical History:  Diagnosis Date   AICD (automatic cardioverter/defibrillator) present    loop recorder   Anxiety    Anxiety state 12/14/2018   Asthma    Atrial fibrillation (HCC)    s/p ablations   Atrial flutter (HCC)    COPD (chronic obstructive pulmonary disease) ([place])    Degenerative arthritis    "maybe in my left knee" (12/16/2016)   Depression    Dysrhythmia 2010   A-fib   Frozen shoulder    hx bilaterally; "had to go thru PT; no problem w/them since I learned exercises" (12/16/2016)   GERD (gastroesophageal reflux disease)    Glaucoma    H/O: rheumatic fever 1957   High cholesterol    Migraine    "getting botox now; now only 2-3/month" (12/16/2016)   PONV (postoperative nausea and vomiting)    with Fentanyl,  Dilaudid &  Propofol (12/16/2016)   Past Surgical History:  Procedure Laterality Date   ABDOMINAL HYSTERECTOMY  1990   right ovary removed   [place] N/A 12/16/2016   Procedure: ATRIAL FIBRILLATION ABLATION;  Surgeon: Thompson Grayer, MD;  Location: [facility];  Service: Cardiovascular;  Laterality: N/A;   CATARACT EXTRACTION, BILATERAL     CTI ablation  3/11   ESOPHAGOGASTRODUODENOSCOPY (EGD) WITH PROPOFOL N/A 01/06/2013   Procedure: ESOPHAGOGASTRODUODENOSCOPY (EGD) WITH PROPOFOL;  Surgeon: Cleotis Nipper, MD;  [facility];  Service: Endoscopy;  Laterality: N/A;   KNEE ARTHROSCOPY Bilateral 1999-2006   KNEE SURGERY  Bilateral [id number]   "open; for congenital deformity"   LOOP RECORDER INSERTION N/A 10/02/2016   Procedure: LOOP RECORDER INSERTION;  Surgeon: Thompson Grayer, MD;  Location: [facility];  Service: Cardiovascular;  Laterality: N/A;   PERIPHERAL VASCULAR INTERVENTION  2/11  & 6/11   RHINOPLASTY  1965   TONSILLECTOMY  as child   TOOTH EXTRACTION  2009   2 teeth extractted   TOTAL KNEE ARTHROPLASTY Left 12/27/2018   Procedure: TOTAL KNEE ARTHROPLASTY;  Surgeon: Elsie Saas, MD;  [facility];  Service: Orthopedics;  Laterality: Left;    ROS- all systems are reviewed and negatives except as per HPI above  Current Outpatient Medications  Medication Sig Dispense Refill   acetaminophen (TYLENOL) 325 MG tablet Take 325 mg 2 (two) times daily as needed by mouth (pain).      albuterol (VENTOLIN HFA) 108 (90 Base) MCG/ACT inhaler INHALE 2 PUFFS INTO THE LUNGS EVERY 6 HOURS AS NEEDED FOR WHEEZING OR SHORTNESS OF BREATH. 18 g 2   ALPRAZolam (XANAX) 0.5 MG tablet TAKE 1 TABLET BY MOUTH TWICE A DAY AS NEEDED FOR ANXIETY 60 tablet 2   apixaban (ELIQUIS) 5 MG TABS tablet TAKE 1 TABLET BY MOUTH TWICE DAILY 180 tablet 1   BOTOX 200 units SOLR INJECT 200 UNITS  INTRAMUSCULARLY EVERY 3  MONTHS (GIVEN AT MD OFFICE, DISCARD UNUSED PORTION  AFTER FIRST USE) 1 each 3   buPROPion (WELLBUTRIN XL) 300 MG 24 hr tablet  TAKE 1 TABLET BY MOUTH EVERY MORNING 90 tablet 2   butalbital-acetaminophen-caffeine (FIORICET) 50-325-40 MG tablet TAKE 1 TABLET BY MOUTH EVERY 6 HOURS AS NEEDED FOR HEADACHE. 90 tablet 0   Diclofenac Sodium 1 % CREA as needed.     dorzolamide-timolol (COSOPT) 22.3-6.8 MG/ML ophthalmic solution PLACE 1 DROP IN BOTH EYES TWO TIMES DAILY 20 mL 4   Hypromellose (ARTIFICIAL TEARS OP) Place 1 drop into both eyes daily as needed (dry eyes).      neomycin-polymyxin b-dexamethasone (MAXITROL) 3.5-10000-0.1 OINT Apply 1/4 inch strip to each eye twice daily 3.5 g 1   Probiotic Product (ALIGN PO) Take  4 mg by mouth daily.     promethazine (PHENERGAN) 25 MG tablet Take 1 tablet (25 mg total) by mouth every 6 (six) hours as needed for nausea or vomiting. 20 tablet 1   sertraline (ZOLOFT) 100 MG tablet TAKE 1 TABLET BY MOUTH ONCE A DAY 90 tablet 2   Simethicone (GAS-X PO) Take 2 tablets daily as needed by mouth (gas).     sodium chloride (OCEAN) 0.65 % SOLN nasal spray Place 1 spray as needed into both nostrils for congestion.     SUMAtriptan (IMITREX) 100 MG tablet TAKE 1 TABLET BY MOUTH AT THE ONSET OF A HEADACHE DAILY AS NEEDED 27 tablet 2   trimethoprim-polymyxin b (POLYTRIM) ophthalmic solution Place 1 drop into both eyes every 4 (four) hours. (Patient not taking: Reported on 11/05/2020) 10 mL 0   Current Facility-Administered Medications  Medication Dose Route Frequency Provider Last Rate Last Admin   botulinum toxin Type A (BOTOX) injection 200 Units  200 Units Intramuscular Once Kathrynn Ducking, MD        Physical Exam: Vitals:   11/05/20 1347  BP: 110/70  Pulse: 68  SpO2: 97%  Weight: 144 lb 12.8 oz (65.7 kg)  Height: 5\' 7"  (1.702 m)    GEN- The patient is well appearing, alert and oriented x 3 today.   Head- normocephalic, atraumatic Eyes-  Sclera clear, conjunctiva pink Ears- hearing intact Oropharynx- clear Lungs- Clear to ausculation bilaterally, normal work of breathing Heart- Regular rate and rhythm, no murmurs, rubs or gallops, PMI not laterally displaced GI- soft, NT, ND, + BS Extremities- no clubbing, cyanosis, or edema  Wt Readings from Last 3 Encounters:  11/05/20 144 lb 12.8 oz (65.7 kg)  10/04/20 142 lb 3.2 oz (64.5 kg)  09/01/20 149 lb (67.6 kg)    EKG tracing ordered today is personally reviewed and shows sinus with PVCs  Assessment and Plan:  Paroxysmal atrial fibrillation/ atrial flutter Doing very well post ablation off AAD therapy Chads2vasc score is 2.  ILR has reached RRT She wishes to have this removed She is on eliquis for stroke  prevention We discussed [employer] and [employer] as means to further evaluate for afib.  She does not wish to have a new ILR placed. She wishes to have her current device removed as it is no longer functioning.  Risks to ILR removal were discussed with the patient who wishes to proceed.  2. OSA Follows with Dr Maxwell Caul    Risks, benefits and potential toxicities for medications prescribed and/or refilled reviewed with patient today.   Thompson Grayer MD, [facility] 11/05/2020 2:15 PM   PROCEDURES:   1. Implantable loop recorder explantation       DESCRIPTION OF PROCEDURE:  Informed written consent was obtained.  The patient required no sedation for the procedure today.   The patients left chest  was therefore prepped and draped in the usual sterile fashion.  The skin overlying the ILR monitor was infiltrated with lidocaine for local analgesia.  A 0.5-cm incision was made over the site.  The previously implanted ILR was exposed and removed using a combination of sharp and blunt dissection.  Steri- Strips and a sterile dressing were then applied. EBL<76ml.  There were no early apparent complications.     CONCLUSIONS:   1. Successful explantation of a [employer] Reveal LINQ implantable loop recorder   2. No early apparent complications.        Thompson Grayer MD, [facility] 11/05/2020 2:25 PM

## 2020-11-05 NOTE — Patient Instructions (Signed)
Medication Instructions:  Your physician recommends that you continue on your current medications as directed. Please refer to the Current Medication list given to you today.  Labwork: None ordered.  Testing/Procedures: None ordered.  Follow-Up:  Your physician wants you to follow-up in: one year with Tommy[employer] Standard APP or Oda Kilts APP.  You will receive a reminder letter in the mail two months in advance. If you don't receive a letter, please call our office to schedule the follow-up appointment.    Implantable Loop Recorder Removal, Care After This sheet gives you information about how to care for yourself after your procedure. Your health care provider may also give you more specific instructions. If you have problems or questions, contact your health care provider. What can I expect after the procedure? After the procedure, it is common to have: Soreness or discomfort near the incision. Some swelling or bruising near the incision.  Follow these instructions at home: Incision care   Leave your outer dressing on for 24 hours.  After 24 hours you can remove your outer dressing and shower. Leave adhesive strips in place. These skin closures may need to stay in place for 1-2 weeks. If adhesive strip edges start to loosen and curl up, you may trim the loose edges.  You may remove the strips if they have not fallen off after 2 weeks. Check your incision area every day for signs of infection. Check for: Redness, swelling, or pain. Fluid or blood. Warmth. Pus or a bad smell. Do not take baths, swim, or use a hot tub until your incision is completely healed. If your wound site starts to bleed apply pressure.      If you have any questions/concerns please call the device clinic at [phone].  Activity  Return to your normal activities.  Contact a health care provider if: You have redness, swelling, or pain around your incision. You have a fever.

## 2020-11-13 ENCOUNTER — Other Ambulatory Visit: Payer: Self-pay | Admitting: Neurology

## 2020-11-13 ENCOUNTER — Other Ambulatory Visit (HOSPITAL_COMMUNITY): Payer: Self-pay

## 2020-11-13 DIAGNOSIS — F411 Generalized anxiety disorder: Secondary | ICD-10-CM | POA: Diagnosis not present

## 2020-11-13 DIAGNOSIS — I4891 Unspecified atrial fibrillation: Secondary | ICD-10-CM | POA: Diagnosis not present

## 2020-11-13 DIAGNOSIS — D6869 Other thrombophilia: Secondary | ICD-10-CM | POA: Diagnosis not present

## 2020-11-13 DIAGNOSIS — F17200 Nicotine dependence, unspecified, uncomplicated: Secondary | ICD-10-CM | POA: Diagnosis not present

## 2020-11-13 DIAGNOSIS — F3341 Major depressive disorder, recurrent, in partial remission: Secondary | ICD-10-CM | POA: Diagnosis not present

## 2020-11-13 DIAGNOSIS — G43909 Migraine, unspecified, not intractable, without status migrainosus: Secondary | ICD-10-CM | POA: Diagnosis not present

## 2020-11-13 DIAGNOSIS — R062 Wheezing: Secondary | ICD-10-CM | POA: Diagnosis not present

## 2020-11-13 DIAGNOSIS — F439 Reaction to severe stress, unspecified: Secondary | ICD-10-CM | POA: Diagnosis not present

## 2020-11-13 DIAGNOSIS — H409 Unspecified glaucoma: Secondary | ICD-10-CM | POA: Diagnosis not present

## 2020-11-13 MED ORDER — BUTALBITAL-APAP-CAFFEINE 50-325-40 MG PO TABS
ORAL_TABLET | ORAL | 0 refills | Status: DC
  Filled 2020-11-13: qty 90, 22d supply, fill #0

## 2020-12-04 ENCOUNTER — Other Ambulatory Visit (HOSPITAL_COMMUNITY): Payer: Self-pay

## 2020-12-12 IMAGING — MG DIGITAL SCREENING BILAT W/ TOMO W/ CAD
8 series · 9 of 24 positions shown · non-contrast
Comparison: Previous exam(s).

CLINICAL DATA: Screening.

EXAM:
DIGITAL SCREENING BILATERAL MAMMOGRAM WITH TOMO AND CAD

[L CC synth-2D]
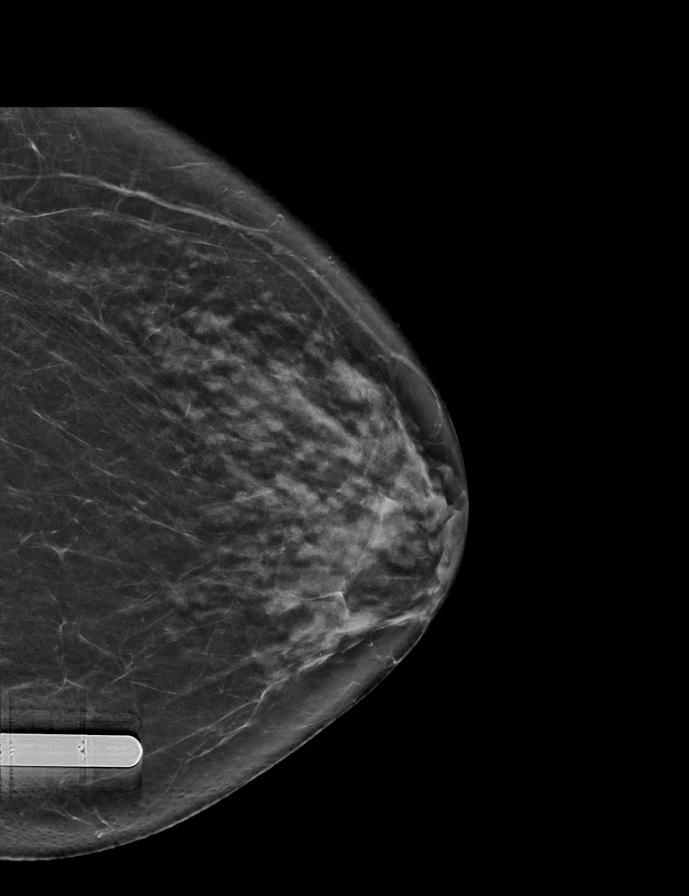

[R CC synth-2D]
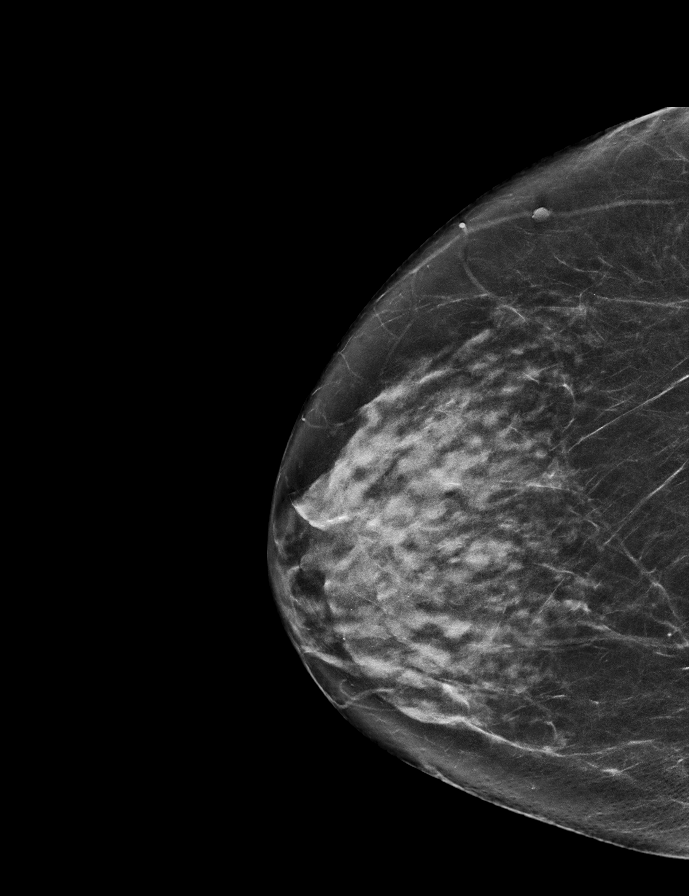

[R MLO synth-2D]
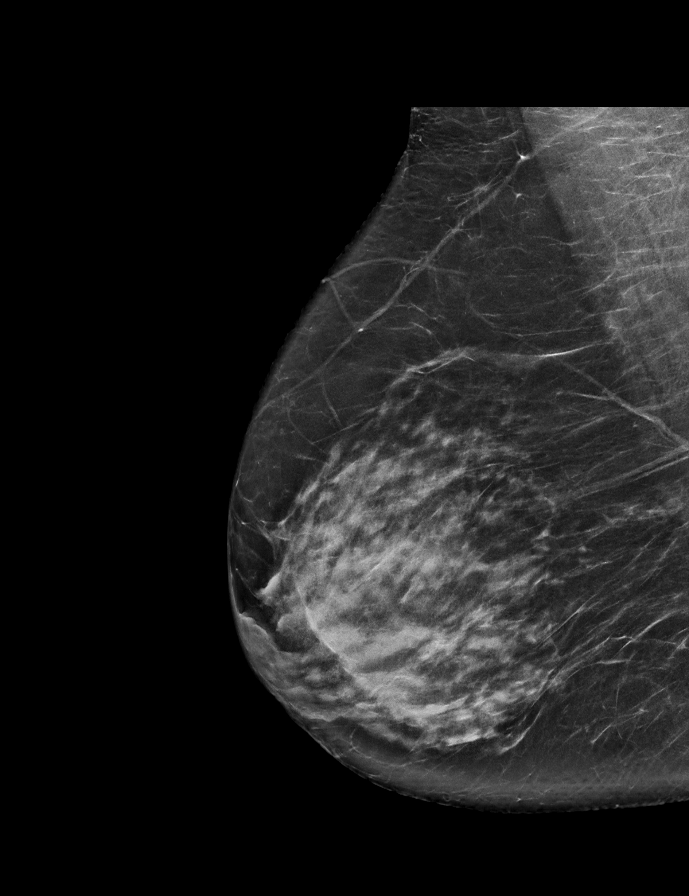

[L MLO synth-2D]
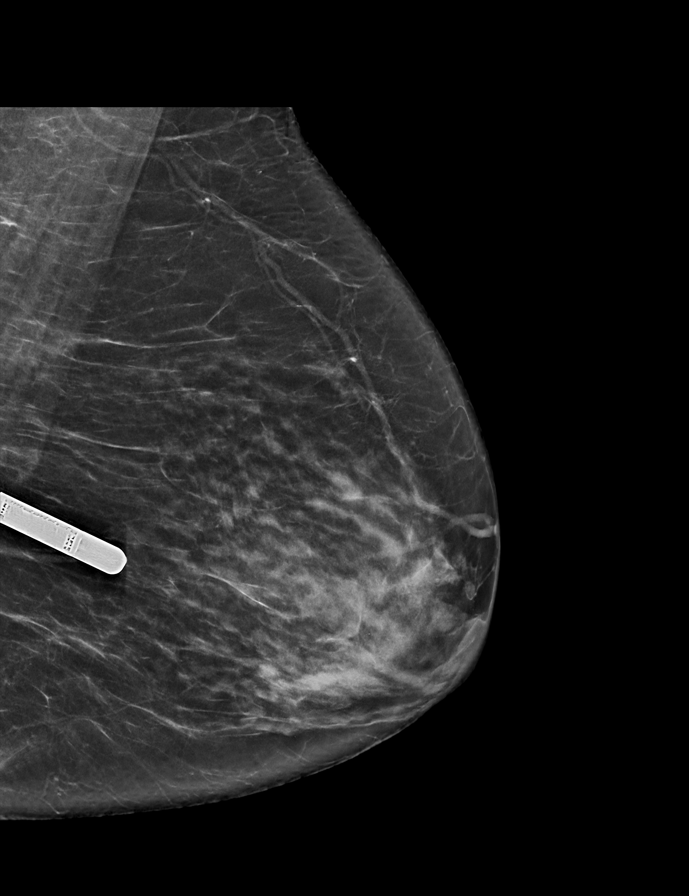

[R CC tomo · 2 of 63 frames shown]
[frame 21/63]
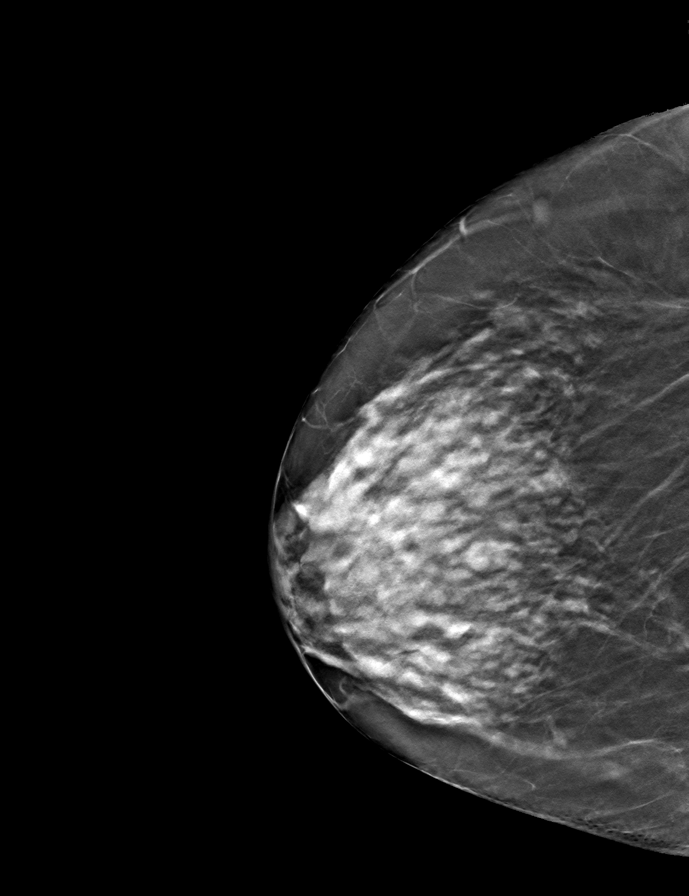
[frame 32/63]
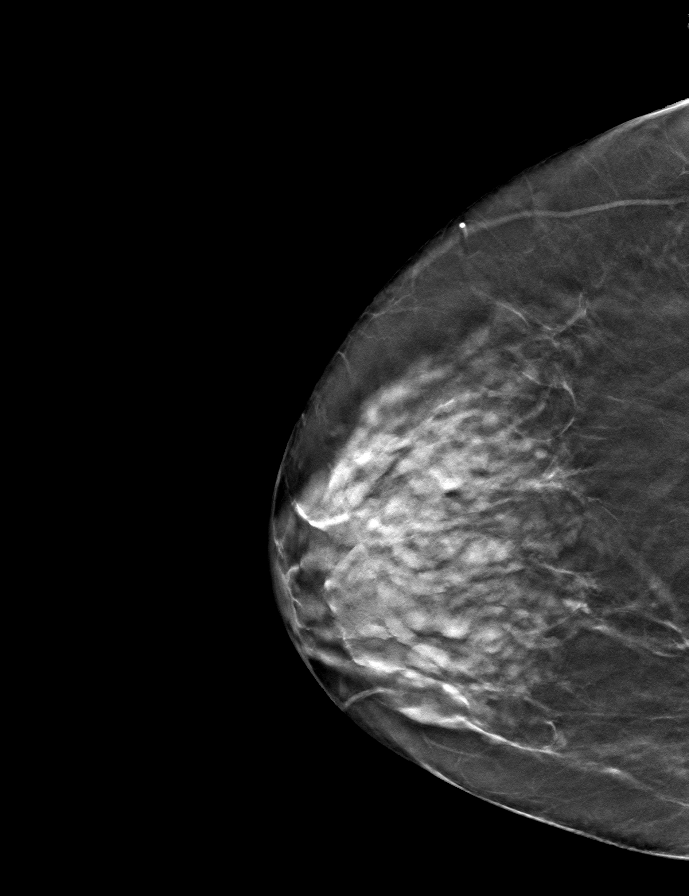

[R MLO tomo · tomo slice 35/70.0]
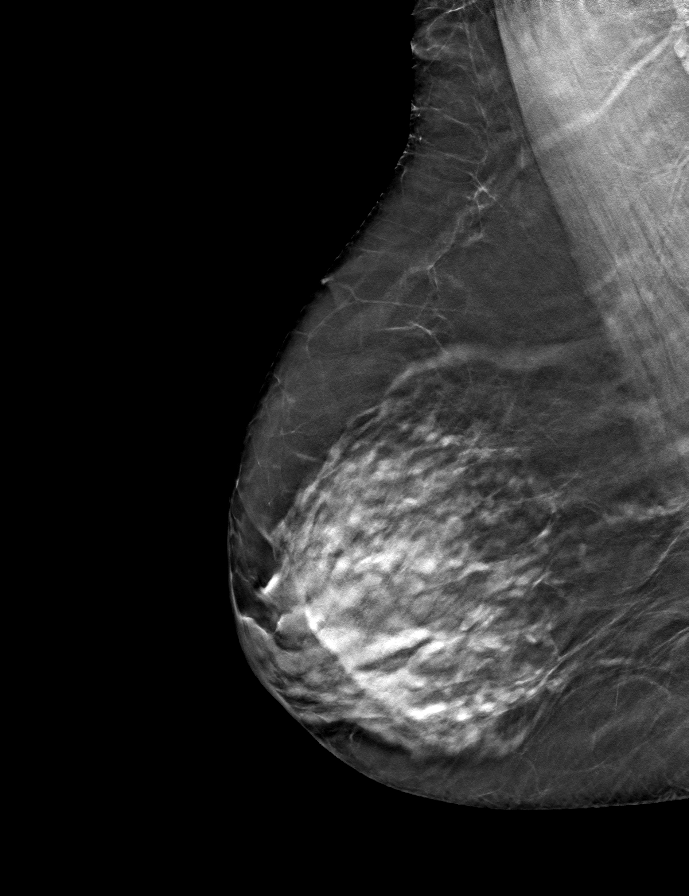

[L CC tomo · tomo slice 31/62.0]
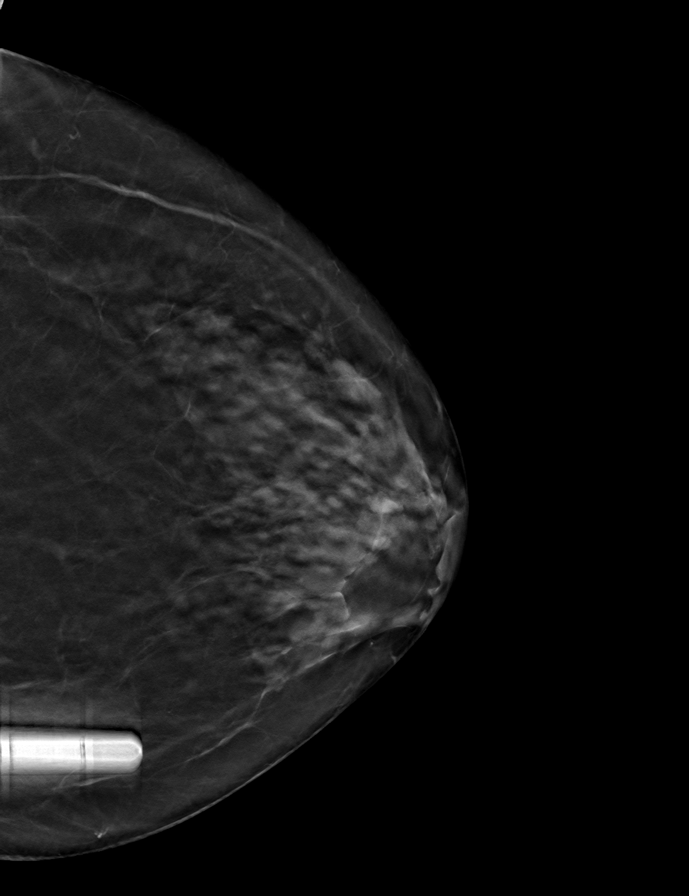

[L MLO tomo · tomo slice 33/65.0]
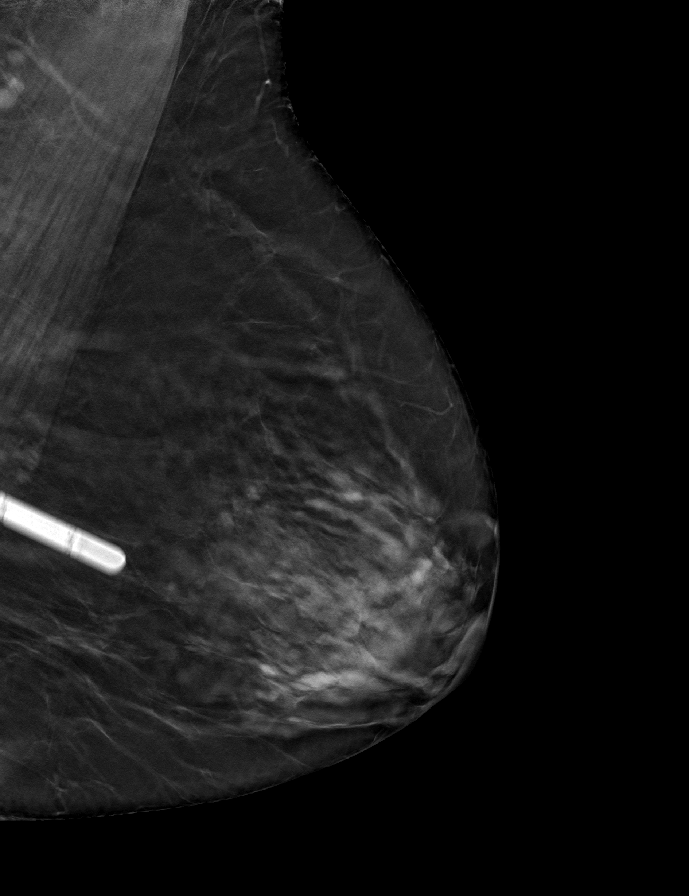

[9 of 24 positions shown; findings below may reference images not displayed]

ACR Breast Density Category c: The breast tissue is heterogeneously
dense, which may obscure small masses.
FINDINGS: There are no findings suspicious for malignancy. Images were
processed with CAD.
IMPRESSION: No mammographic evidence of malignancy. A result letter of this
screening mammogram will be mailed directly to the patient.

RECOMMENDATION:
Screening mammogram in one year. (Code:[id number])

BI-RADS CATEGORY  1: Negative.

## 2020-12-18 ENCOUNTER — Other Ambulatory Visit (HOSPITAL_COMMUNITY): Payer: Self-pay

## 2020-12-18 DIAGNOSIS — F331 Major depressive disorder, recurrent, moderate: Secondary | ICD-10-CM | POA: Diagnosis not present

## 2020-12-18 MED ORDER — BUPROPION HCL ER (XL) 300 MG PO TB24
300.0000 mg | ORAL_TABLET | Freq: Every morning | ORAL | 2 refills | Status: DC
  Filled 2020-12-18: qty 90, 90d supply, fill #0
  Filled 2021-03-23: qty 90, 90d supply, fill #1
  Filled 2021-10-23: qty 60, 60d supply, fill #2

## 2020-12-18 MED ORDER — ALPRAZOLAM 0.5 MG PO TABS
ORAL_TABLET | ORAL | 2 refills | Status: DC
  Filled 2020-12-18: qty 60, 30d supply, fill #0
  Filled 2021-02-06: qty 60, 30d supply, fill #1
  Filled 2021-03-23: qty 60, 30d supply, fill #2

## 2020-12-18 MED ORDER — SERTRALINE HCL 100 MG PO TABS
ORAL_TABLET | ORAL | 2 refills | Status: DC
  Filled 2020-12-18: qty 90, 90d supply, fill #0
  Filled 2021-03-23: qty 90, 90d supply, fill #1

## 2020-12-19 ENCOUNTER — Other Ambulatory Visit (HOSPITAL_COMMUNITY): Payer: Self-pay

## 2021-01-03 ENCOUNTER — Other Ambulatory Visit (HOSPITAL_COMMUNITY): Payer: Self-pay

## 2021-01-07 ENCOUNTER — Other Ambulatory Visit (HOSPITAL_COMMUNITY): Payer: Self-pay

## 2021-01-08 ENCOUNTER — Other Ambulatory Visit (HOSPITAL_COMMUNITY): Payer: Self-pay

## 2021-01-08 ENCOUNTER — Other Ambulatory Visit: Payer: Self-pay | Admitting: Internal Medicine

## 2021-01-08 DIAGNOSIS — I480 Paroxysmal atrial fibrillation: Secondary | ICD-10-CM

## 2021-01-08 MED ORDER — APIXABAN 5 MG PO TABS
5.0000 mg | ORAL_TABLET | Freq: Two times a day (BID) | ORAL | 1 refills | Status: DC
  Filled 2021-01-08: qty 180, 90d supply, fill #0
  Filled 2021-04-09: qty 180, 90d supply, fill #1

## 2021-01-08 NOTE — Telephone Encounter (Signed)
Eliquis 5mg  refill request received. Patient is 70 years old, weight-65.7kg, Crea-0.74 on 07/31/2020, Diagnosis-Afib, and last seen by Dr. Rayann Heman on 11/05/2020. Dose is appropriate based on dosing criteria. Will send in refill to requested pharmacy.

## 2021-01-09 IMAGING — US US ABDOMEN LIMITED
1 series · 14 of 25 positions shown · non-contrast
Comparison: None.

CLINICAL DATA: Right upper quadrant pain

EXAM:
ULTRASOUND ABDOMEN LIMITED RIGHT UPPER QUADRANT

[Series 1: us abdomen limited · 0.22mm/px · 14 of 39 slices shown]
[im 1/39]
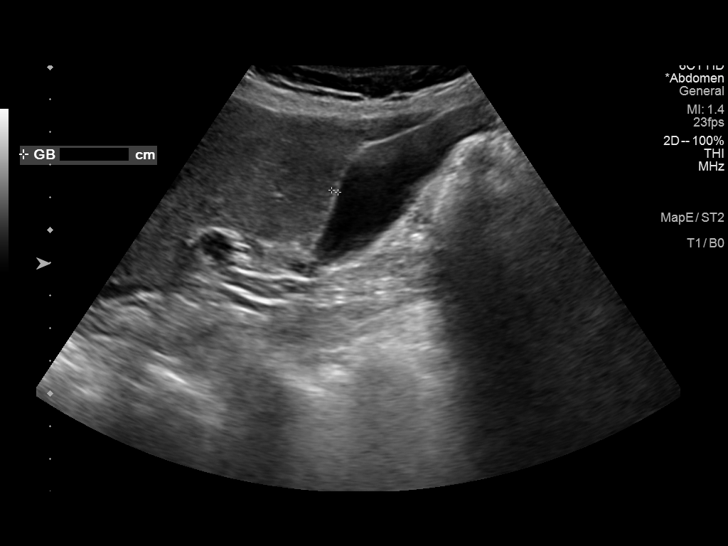
[im 4/39]
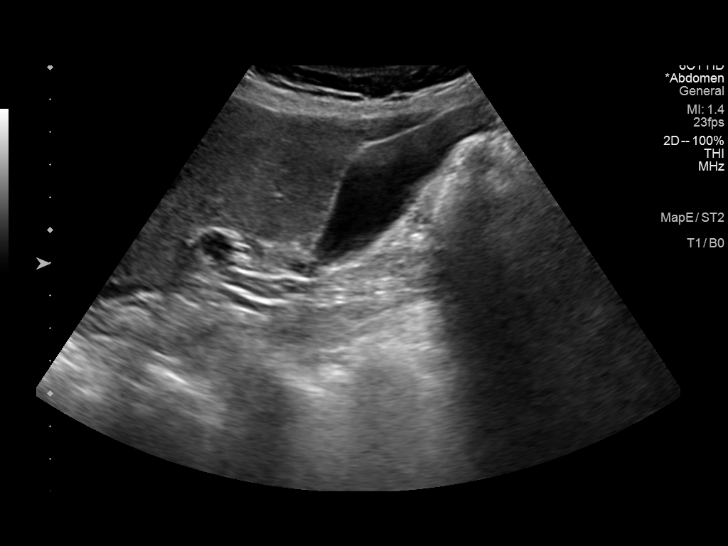
[im 7/39]
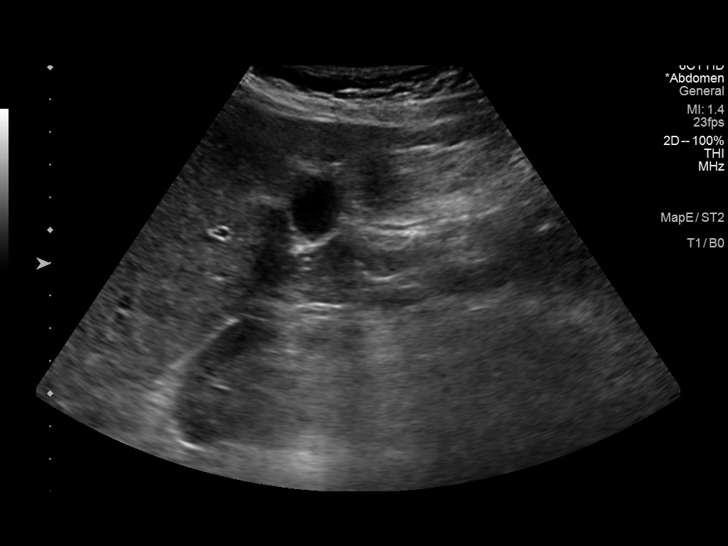
[im 10/39]
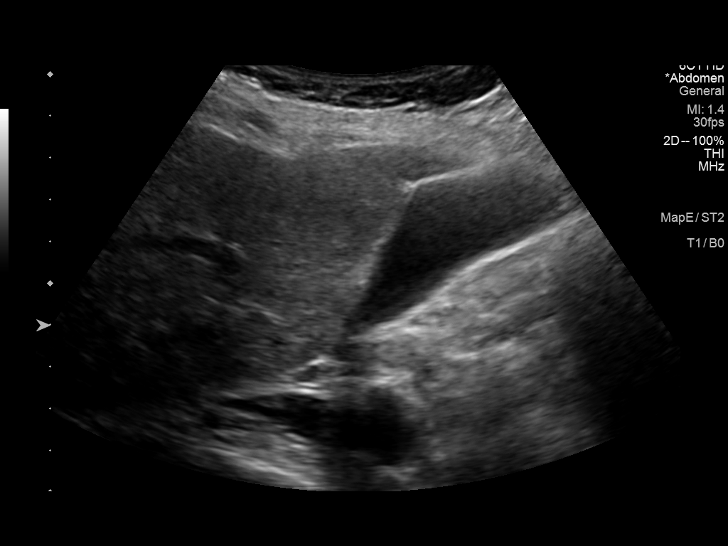
[im 13/39]
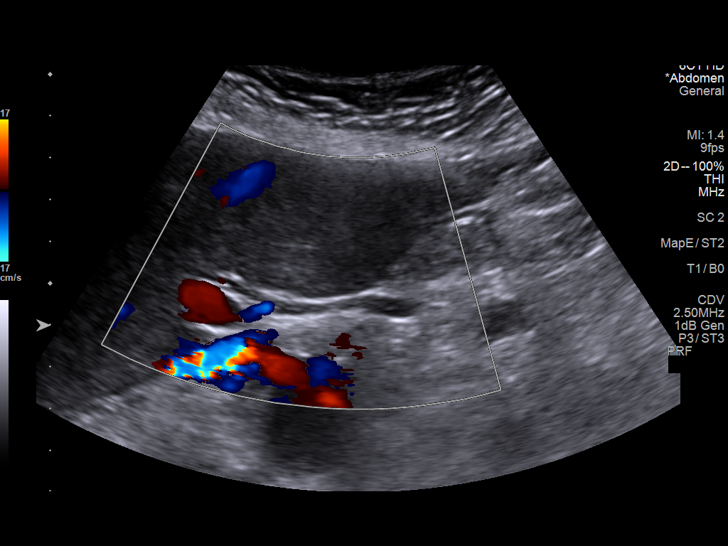
[im 15/39]
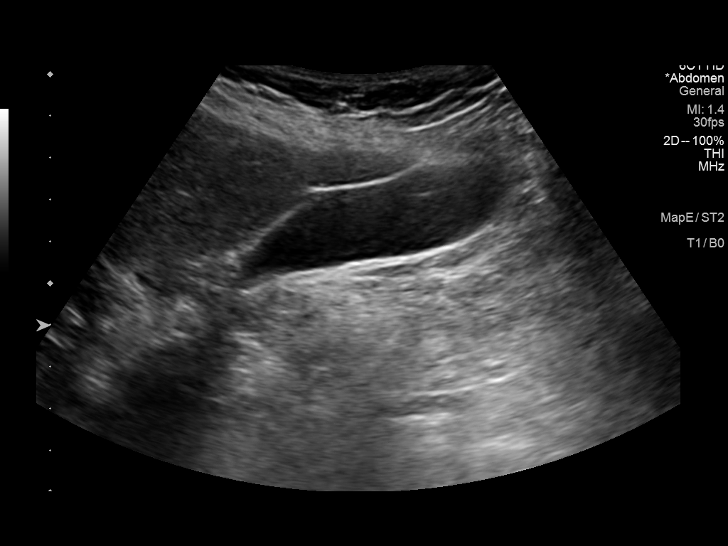
[im 18/39]
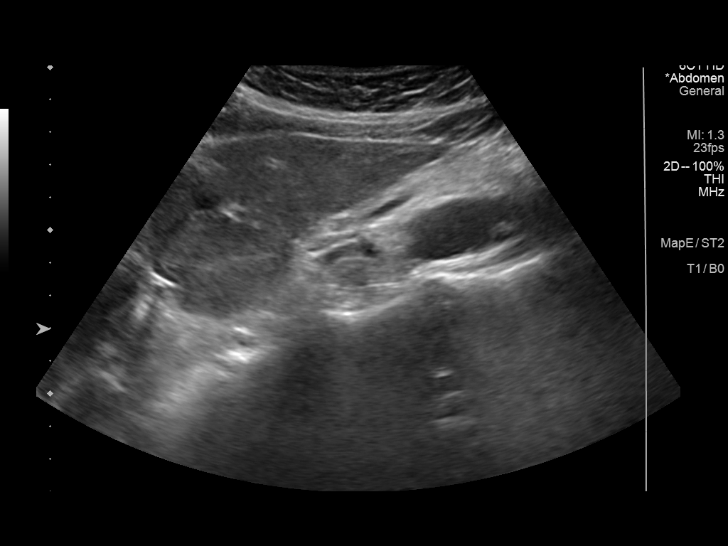
[im 21/39]
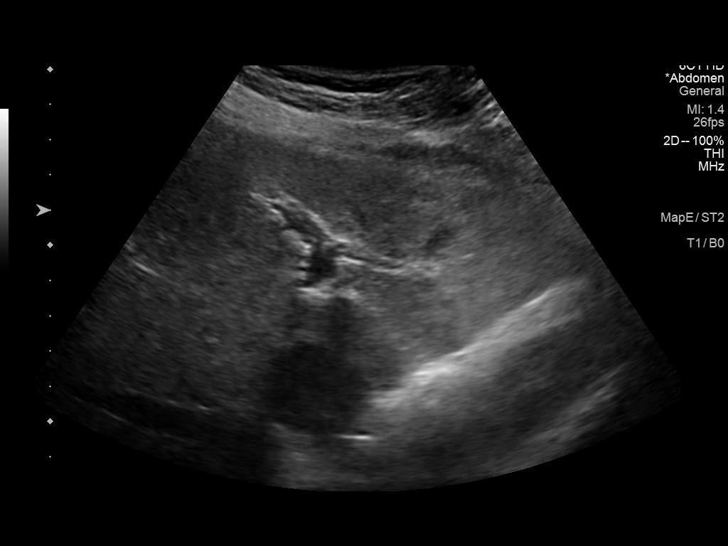
[im 24/39]
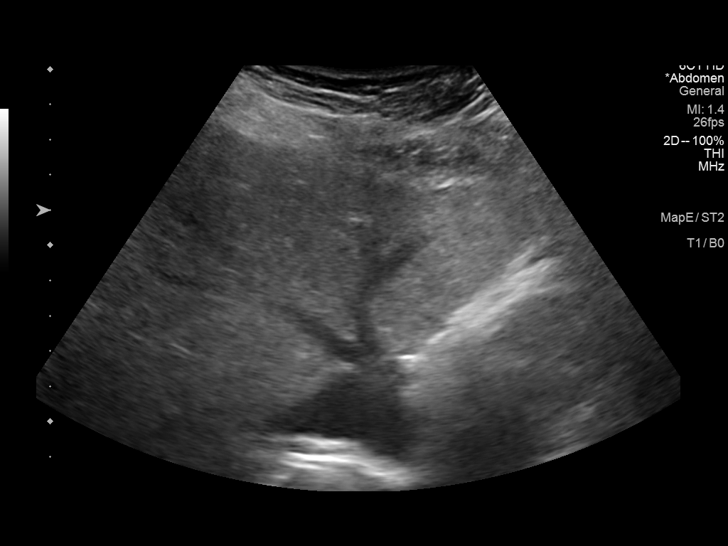
[im 26/39]
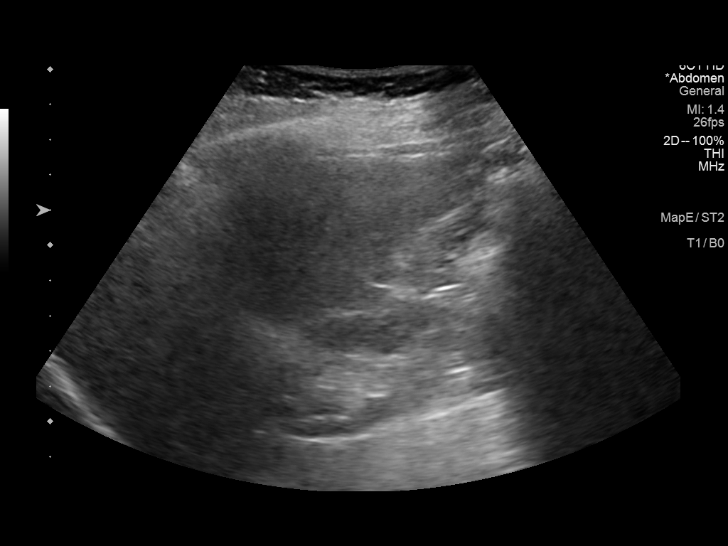
[im 29/39]
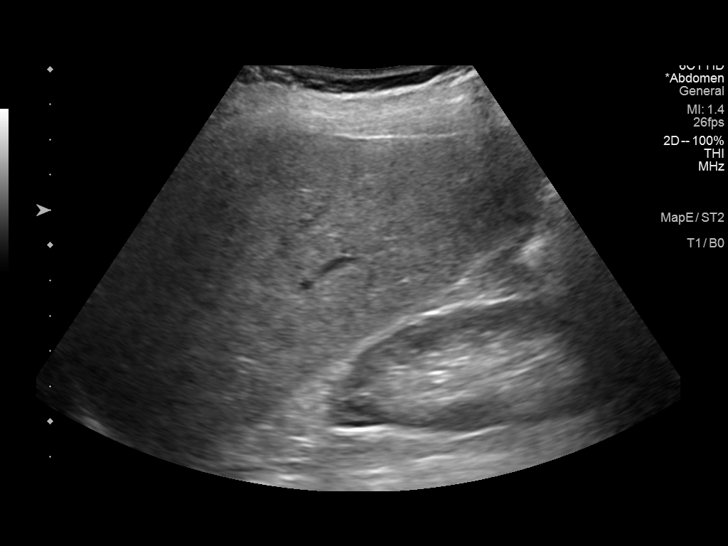
[im 32/39]
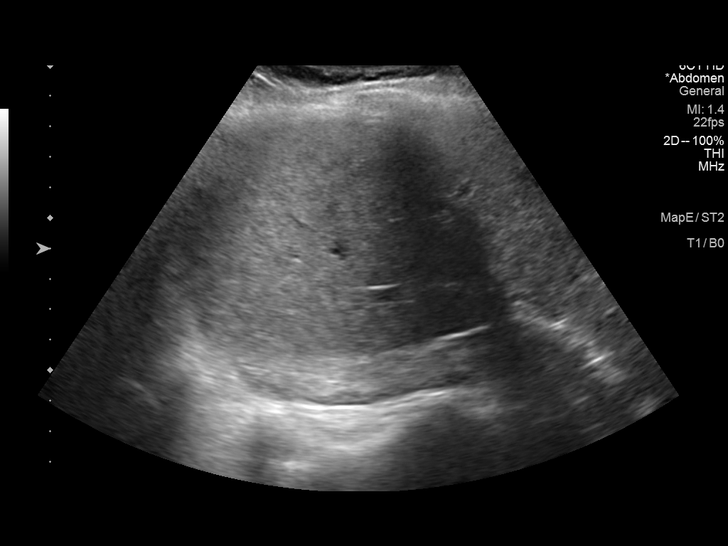
[im 35/39]
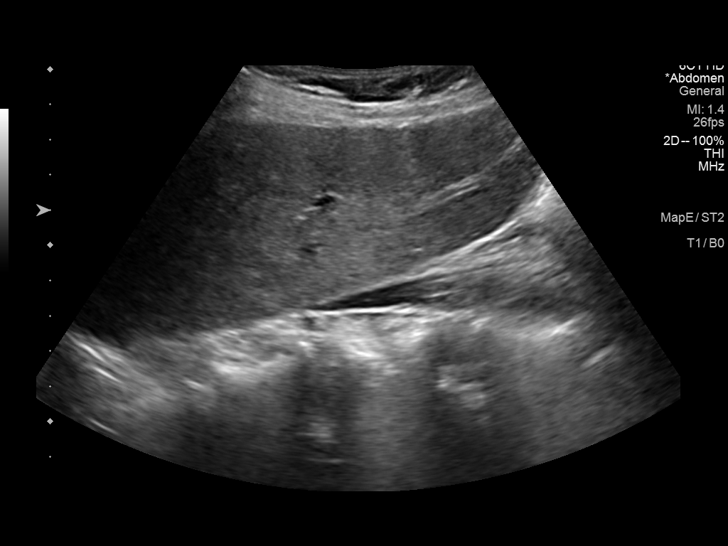
[im 39/39]
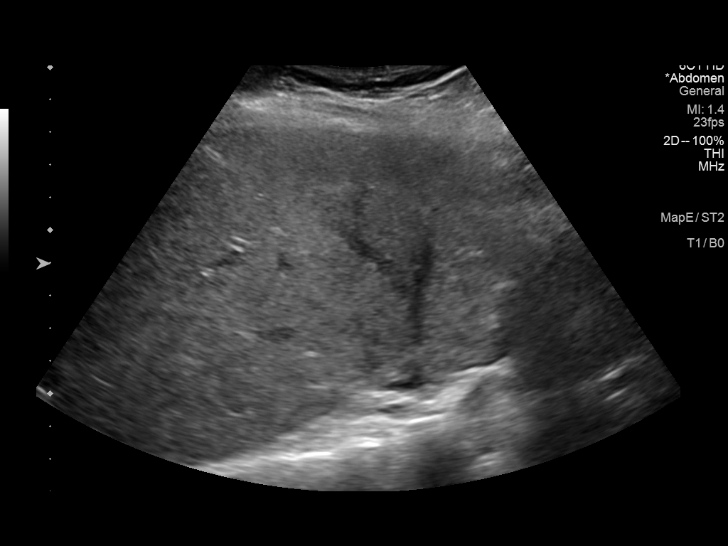

[14 of 25 positions shown; findings below may reference images not displayed]

FINDINGS: Gallbladder:

No gallstones or wall thickening visualized. No sonographic Murphy
sign noted by sonographer. Nondistended.

Common bile duct:

Diameter: 2.7 mm, within normal limits.

Liver:

No focal lesion identified. Within normal limits in parenchymal
echogenicity. Portal vein is patent on color Doppler imaging with
normal direction of blood flow towards the liver.
IMPRESSION: No abnormality to explain the patient's pain.

## 2021-01-10 ENCOUNTER — Encounter: Payer: Self-pay | Admitting: Neurology

## 2021-01-10 ENCOUNTER — Ambulatory Visit: Payer: Medicare PPO | Admitting: Neurology

## 2021-01-10 VITALS — BP 128/60 | HR 68 | Ht 67.0 in | Wt 144.0 lb

## 2021-01-10 DIAGNOSIS — G43719 Chronic migraine without aura, intractable, without status migrainosus: Secondary | ICD-10-CM | POA: Diagnosis not present

## 2021-01-10 NOTE — Procedures (Signed)
     BOTOX PROCEDURE NOTE FOR MIGRAINE HEADACHE   HISTORY: Suzanne [name] presents today for Botox injection for chronic migraine headache.  Her last injection was in August 2022.  For the last 3 weeks she has had daily headache as the Botox wears off.  She has clearly benefited from Botox over time.  She has done well with the procedure.  Description of procedure:  The patient was placed in a sitting position. The standard protocol was used for Botox as follows, with 5 units of Botox injected at each site:   -Procerus muscle, midline injection  -Corrugator muscle, bilateral injection  -Frontalis muscle, bilateral injection, with 2 sites each side, medial injection was performed in the upper one third of the frontalis muscle, in the region vertical from the medial inferior edge of the superior orbital rim. The lateral injection was again in the upper one third of the forehead vertically above the lateral limbus of the cornea, 1.5 cm lateral to the medial injection site.  -Temporalis muscle injection, 4 sites, bilaterally. The first injection was 3 cm above the tragus of the ear, second injection site was 1.5 cm to 3 cm up from the first injection site in line with the tragus of the ear. The third injection site was 1.5-3 cm forward between the first 2 injection sites. The fourth injection site was 1.5 cm posterior to the second injection site.  -Occipitalis muscle injection, 3 sites, bilaterally. The first injection was done one half way between the occipital protuberance and the tip of the mastoid process behind the ear. The second injection site was done lateral and superior to the first, 1 fingerbreadth from the first injection. The third injection site was 1 fingerbreadth superiorly and medially from the first injection site.  -Cervical paraspinal muscle injection, 2 sites, bilateral, the first injection site was 1 cm from the midline of the cervical spine, 3 cm inferior to the lower  border of the occipital protuberance. The second injection site was 1.5 cm superiorly and laterally to the first injection site.  -Trapezius muscle injection was performed at 3 sites, bilaterally. The first injection site was in the upper trapezius muscle halfway between the inflection point of the neck, and the acromion. The second injection site was one half way between the acromion and the first injection site. The third injection was done between the first injection site and the inflection point of the neck.   A 200 unit bottle of Botox was used, 155 units were injected, the rest of the Botox was wasted. The patient tolerated the procedure well, there were no complications of the above procedure.  Botox NDC 2500-3704-88 Lot number [id number] Expiration date 03/2023 B/B

## 2021-01-10 NOTE — Progress Notes (Signed)
Botox- 100 units x 2 vial QJS:[id number] Expiration: 03/2023 NDC: 4171-2787-18     B/B

## 2021-02-06 ENCOUNTER — Other Ambulatory Visit: Payer: Self-pay | Admitting: Family Medicine

## 2021-02-06 ENCOUNTER — Other Ambulatory Visit (HOSPITAL_COMMUNITY): Payer: Self-pay

## 2021-02-06 ENCOUNTER — Other Ambulatory Visit: Payer: Self-pay

## 2021-02-06 MED ORDER — ALBUTEROL SULFATE HFA 108 (90 BASE) MCG/ACT IN AERS
2.0000 | INHALATION_SPRAY | Freq: Four times a day (QID) | RESPIRATORY_TRACT | 2 refills | Status: DC | PRN
  Filled 2021-02-06: qty 18, 25d supply, fill #0
  Filled 2021-05-13: qty 18, 25d supply, fill #1
  Filled 2021-08-20: qty 18, 25d supply, fill #2

## 2021-02-09 ENCOUNTER — Other Ambulatory Visit (HOSPITAL_COMMUNITY): Payer: Self-pay

## 2021-02-13 ENCOUNTER — Encounter: Payer: Self-pay | Admitting: Neurology

## 2021-02-13 ENCOUNTER — Telehealth: Payer: Self-pay | Admitting: Neurology

## 2021-02-13 MED ORDER — BUTALBITAL-APAP-CAFFEINE 50-325-40 MG PO TABS
ORAL_TABLET | ORAL | 0 refills | Status: DC
  Filled 2021-02-13: qty 90, 25d supply, fill #0

## 2021-02-13 NOTE — Telephone Encounter (Signed)
Patient's next Botox appointment is 3/7. Botox PA w/ [employer] expired 02/02/21. Completed new PA form & placed in nurse pod for NP signature. Dx: W31.427.

## 2021-02-13 NOTE — Telephone Encounter (Signed)
Faxed signed PA form to [facility] with OV notes.

## 2021-02-14 ENCOUNTER — Other Ambulatory Visit (HOSPITAL_COMMUNITY): Payer: Self-pay

## 2021-02-14 NOTE — Telephone Encounter (Signed)
Received approval from [facility] [place] #[id number] (02/03/21-02/02/22).

## 2021-02-28 ENCOUNTER — Ambulatory Visit (INDEPENDENT_AMBULATORY_CARE_PROVIDER_SITE_OTHER)
Admission: RE | Admit: 2021-02-28 | Discharge: 2021-02-28 | Disposition: A | Discharge status: Home / Self Care | Payer: Medicare PPO | Source: Ambulatory Visit | Attending: Acute Care | Admitting: Acute Care

## 2021-02-28 ENCOUNTER — Other Ambulatory Visit: Payer: Self-pay

## 2021-02-28 DIAGNOSIS — J439 Emphysema, unspecified: Secondary | ICD-10-CM

## 2021-02-28 DIAGNOSIS — I700 Atherosclerosis of aorta: Secondary | ICD-10-CM | POA: Diagnosis not present

## 2021-02-28 DIAGNOSIS — Z87891 Personal history of nicotine dependence: Secondary | ICD-10-CM | POA: Diagnosis not present

## 2021-02-28 DIAGNOSIS — F17210 Nicotine dependence, cigarettes, uncomplicated: Secondary | ICD-10-CM

## 2021-03-04 ENCOUNTER — Other Ambulatory Visit: Payer: Self-pay | Admitting: Acute Care

## 2021-03-04 DIAGNOSIS — F17210 Nicotine dependence, cigarettes, uncomplicated: Secondary | ICD-10-CM

## 2021-03-04 DIAGNOSIS — Z87891 Personal history of nicotine dependence: Secondary | ICD-10-CM

## 2021-03-15 NOTE — Progress Notes (Signed)
Phone [phone] Virtual visit via Video note   Subjective:  Chief complaint: Chief Complaint  Patient presents with   Follow-up    Pt would like to discuss lung cancer screen results from January.    This visit type was conducted due to national recommendations for restrictions regarding the COVID-19 Pandemic (e.g. social distancing).  This format is felt to be most appropriate for this patient at this time balancing risks to patient and risks to population by having him in for in person visit.  No physical exam was performed (except for noted visual exam or audio findings with Telehealth visits).    Our team/I connected with Suzanne [name] at  4:00 PM EST by a video enabled telemedicine application (doxy.me or caregility through epic) and verified that I am speaking with the correct person using two identifiers.  Location patient: [facility] Location provider: [facility], office Persons participating in the virtual visit:  patient  Our team/I discussed the limitations of evaluation and management by telemedicine and the availability of in person appointments. In light of current covid-19 pandemic, patient also understands that we are trying to protect them by minimizing in office contact if at all possible.  The patient expressed consent for telemedicine visit and agreed to proceed. Patient understands insurance will be billed.   Past Medical History-  Patient Active Problem List   Diagnosis Date Noted   Paroxysmal atrial fibrillation [facility]) s/p ablation november 2018, 2011 previously 12/16/2016    Priority: High   Tobacco abuse 10/03/2013    Priority: High   COPD (chronic obstructive pulmonary disease) ([place]) 02/01/2009    Priority: High   Glaucoma 01/08/2018    Priority: Medium    Primary localized osteoarthritis of left knee 01/08/2018    Priority: Medium    High cholesterol     Priority: Medium    Intractable chronic migraine without aura 05/17/2014    Priority: Medium     Depression, major, single episode, complete remission ([place]) 02/01/2009    Priority: Medium    Asthma 02/01/2009    Priority: Medium    Low vitamin D level 01/08/2018    Priority: Low   Nasal injury 10/20/2016    Priority: Low   S/P ablation of atrial fibrillation 05/16/2013    Priority: Low   Anxiety state 12/14/2018    Medications- reviewed and updated Current Outpatient Medications  Medication Sig Dispense Refill   acetaminophen (TYLENOL) 325 MG tablet Take 325 mg 2 (two) times daily as needed by mouth (pain).      albuterol (VENTOLIN HFA) 108 (90 Base) MCG/ACT inhaler INHALE 2 PUFFS INTO THE LUNGS EVERY 6 HOURS AS NEEDED FOR WHEEZING OR SHORTNESS OF BREATH. 18 g 2   ALPRAZolam (XANAX) 0.5 MG tablet TAKE 1 TABLET BY MOUTH TWICE A DAY AS NEEDED FOR ANXIETY. 60 tablet 2   apixaban (ELIQUIS) 5 MG TABS tablet Take 1 tablet by mouth 2 times daily. 180 tablet 1   BOTOX 200 units SOLR INJECT 200 UNITS  INTRAMUSCULARLY EVERY 3  MONTHS (GIVEN AT MD OFFICE, DISCARD UNUSED PORTION  AFTER FIRST USE) 1 each 3   buPROPion (WELLBUTRIN XL) 300 MG 24 hr tablet TAKE 1 TABLET BY MOUTH EVERY MORNING 90 tablet 2   butalbital-acetaminophen-caffeine (FIORICET) 50-325-40 MG tablet TAKE 1 TABLET BY MOUTH EVERY 6 HOURS AS NEEDED FOR HEADACHE. 90 tablet 0   Diclofenac Sodium 1 % CREA as needed.     dorzolamide-timolol (COSOPT) 22.3-6.8 MG/ML ophthalmic solution PLACE 1 DROP IN  BOTH EYES TWO TIMES DAILY 20 mL 4   Hypromellose (ARTIFICIAL TEARS OP) Place 1 drop into both eyes daily as needed (dry eyes).      neomycin-polymyxin b-dexamethasone (MAXITROL) 3.5-10000-0.1 OINT Apply 1/4 inch strip to each eye twice daily 3.5 g 1   Probiotic Product (ALIGN PO) Take 4 mg by mouth daily.     promethazine (PHENERGAN) 25 MG tablet Take 1 tablet (25 mg total) by mouth every 6 (six) hours as needed for nausea or vomiting. 20 tablet 1   sertraline (ZOLOFT) 100 MG tablet TAKE 1 TABLET BY MOUTH ONCE A DAY. 90 tablet 2    Simethicone (GAS-X PO) Take 2 tablets daily as needed by mouth (gas).     sodium chloride (OCEAN) 0.65 % SOLN nasal spray Place 1 spray as needed into both nostrils for congestion.     SUMAtriptan (IMITREX) 100 MG tablet TAKE 1 TABLET BY MOUTH AT THE ONSET OF A HEADACHE DAILY AS NEEDED 27 tablet 2   Current Facility-Administered Medications  Medication Dose Route Frequency Provider Last Rate Last Admin   botulinum toxin Type A (BOTOX) injection 200 Units  200 Units Intramuscular Once Kathrynn Ducking, MD         Objective:  BP 118/80    Ht 5' 7"  (1.702 m)    Wt 146 lb (66.2 kg)    BMI 22.87 kg/m  self reported vitals Gen: NAD, resting comfortably Lungs: nonlabored, normal respiratory rate  Skin: appears dry, no obvious rash Husband is with her today     Assessment and Plan  #Liver nodularity S: Patient reached out based on findings from CT lung cancer screening "Liver contour shows subtle nodularity today (see image 63/2) raising the question of cirrhosis."  We cross-referenced this with ultrasound of the abdomen right upper quadrant from 10/26/2019-"Within normal limits in parenchymal echogenicity."  Patient does not drink alcohol and really never has and has no significant risk factors for cirrhosis.  She also has normal BMI.  She is essentially asking whether further work-up is needed Hepatic Function Latest Ref Rng & Units 07/31/2020 10/20/2019 07/11/2019  Total Protein 6.0 - 8.3 g/dL 6.9 6.4 6.2  Albumin 3.5 - 5.2 g/dL 4.5 - 4.4  AST 0 - 37 U/L 15 11 14   ALT 0 - 35 U/L 12 10 10   Alk Phosphatase 39 - 117 U/L 89 - 93  Total Bilirubin 0.2 - 1.2 mg/dL 0.5 0.5 0.4  Bilirubin, Direct 0.0 - 0.3 mg/dL - - -   A/P: Patient and I discussed liver nodularity and option of updating ultrasound of the liver-we ultimately decided not to do so at this time-area will be rescanned within a year and we will check LFTs in June-if any concerning changes we can certainly evaluate further-one of her  concerns was possible liver cancer and I told her I do not see a risk for this based on findings    Recommended follow up:  Future Appointments  Date Time Provider [place]  04/09/2021  9:15 AM Suzzanne Cloud, NP [facility] None  05/03/2021  1:00 PM [facility] HEALTH COACH [facility]  06/05/2021 10:45 AM Suzzanne Cloud, NP [facility] None  08/01/2021  8:00 AM Marin Olp, MD [facility]    Lab/Order associations:   ICD-10-CM   1. Chronic obstructive pulmonary disease, unspecified COPD type ([place])  J44.9     2. Paroxysmal atrial fibrillation [facility]) s/p ablation november 2018, 2011 previously  I48.0     3. Depression,  major, single episode, complete remission ([place])  F32.5     4. Hyperlipidemia, unspecified hyperlipidemia type  E78.5     5. Aortic atherosclerosis ([facility])  I70.0      Time Spent: 17 minutes of total time (4:46 PM- 5:03 PM) was spent on the date of the encounter performing the following actions: chart review prior to seeing the patient, obtaining history, performing a medically necessary exam, counseling on the treatment plan, placing orders, and documenting in our EHR.   I,Jada Bradford,acting as a scribe for Garret Reddish, MD.,have documented all relevant documentation on the behalf of Garret Reddish, MD,as directed by  Garret Reddish, MD while in the presence of Garret Reddish, MD.  I, Garret Reddish, MD, have reviewed all documentation for this visit. The documentation on 03/18/21 for the exam, diagnosis, procedures, and orders are all accurate and complete.  Return precautions advised.  Garret Reddish, MD

## 2021-03-18 ENCOUNTER — Other Ambulatory Visit: Payer: Self-pay

## 2021-03-18 ENCOUNTER — Telehealth: Payer: Medicare PPO | Admitting: Family Medicine

## 2021-03-18 ENCOUNTER — Encounter: Payer: Self-pay | Admitting: Family Medicine

## 2021-03-18 VITALS — BP 118/80 | Ht 67.0 in | Wt 146.0 lb

## 2021-03-18 DIAGNOSIS — Z961 Presence of intraocular lens: Secondary | ICD-10-CM | POA: Diagnosis not present

## 2021-03-18 DIAGNOSIS — I480 Paroxysmal atrial fibrillation: Secondary | ICD-10-CM

## 2021-03-18 DIAGNOSIS — H401231 Low-tension glaucoma, bilateral, mild stage: Secondary | ICD-10-CM | POA: Diagnosis not present

## 2021-03-18 DIAGNOSIS — R932 Abnormal findings on diagnostic imaging of liver and biliary tract: Secondary | ICD-10-CM | POA: Diagnosis not present

## 2021-03-18 DIAGNOSIS — H26493 Other secondary cataract, bilateral: Secondary | ICD-10-CM | POA: Diagnosis not present

## 2021-03-18 DIAGNOSIS — E785 Hyperlipidemia, unspecified: Secondary | ICD-10-CM

## 2021-03-18 DIAGNOSIS — F325 Major depressive disorder, single episode, in full remission: Secondary | ICD-10-CM

## 2021-03-18 DIAGNOSIS — J449 Chronic obstructive pulmonary disease, unspecified: Secondary | ICD-10-CM

## 2021-03-18 DIAGNOSIS — H5202 Hypermetropia, left eye: Secondary | ICD-10-CM | POA: Diagnosis not present

## 2021-03-18 DIAGNOSIS — I700 Atherosclerosis of aorta: Secondary | ICD-10-CM

## 2021-03-20 DIAGNOSIS — F411 Generalized anxiety disorder: Secondary | ICD-10-CM | POA: Diagnosis not present

## 2021-03-20 DIAGNOSIS — F331 Major depressive disorder, recurrent, moderate: Secondary | ICD-10-CM | POA: Diagnosis not present

## 2021-03-23 ENCOUNTER — Other Ambulatory Visit (HOSPITAL_COMMUNITY): Payer: Self-pay

## 2021-04-09 ENCOUNTER — Ambulatory Visit: Payer: Medicare PPO | Admitting: Adult Health

## 2021-04-09 ENCOUNTER — Other Ambulatory Visit (HOSPITAL_COMMUNITY): Payer: Self-pay

## 2021-04-09 ENCOUNTER — Ambulatory Visit: Payer: Medicare PPO | Admitting: Neurology

## 2021-04-09 VITALS — BP 134/74 | HR 70

## 2021-04-09 DIAGNOSIS — G43719 Chronic migraine without aura, intractable, without status migrainosus: Secondary | ICD-10-CM | POA: Diagnosis not present

## 2021-04-09 NOTE — Progress Notes (Signed)
? ? ?BOTOX PROCEDURE NOTE FOR MIGRAINE HEADACHES ? ? ?Update 04/09/2021 JM: 71 year old female who presents today for Botox injection for chronic migraine headaches.  She was last injected 01/10/2021 by Butler Denmark, NP for which she tolerated procedure well. Does report botox wearing off approx 3 weeks prior to next injection but otherwise botox has been working well for her.  ? ? ? ? ? ?Consent Form ?Botulism Toxin Injection For Chronic Migraine ? ? ? ?Reviewed orally with patient, additionally signature is on file: ? ?Botulism toxin has been approved by the Federal drug administration for treatment of chronic migraine. Botulism toxin does not cure chronic migraine and it may not be effective in some patients. ? ?The administration of botulism toxin is accomplished by injecting a small amount of toxin into the muscles of the neck and head. Dosage must be titrated for each individual. Any benefits resulting from botulism toxin tend to wear off after 3 months with a repeat injection required if benefit is to be maintained. Injections are usually done every 3-4 months with maximum effect peak achieved by about 2 or 3 weeks. Botulism toxin is expensive and you should be sure of what costs you will incur resulting from the injection. ? ?The side effects of botulism toxin use for chronic migraine may include: ? ? -Transient, and usually mild, facial weakness with facial injections ? -Transient, and usually mild, head or neck weakness with head/neck injections ? -Reduction or loss of forehead facial animation due to forehead muscle weakness ? -Eyelid drooping ? -Dry eye ? -Pain at the site of injection or bruising at the site of injection ? -Double vision ? -Potential unknown long term risks ? ? ?Contraindications: You should not have Botox if you are pregnant, nursing, allergic to albumin, have an infection, skin condition, or muscle weakness at the site of the injection, or have myasthenia gravis, Lambert-Eaton syndrome,  or ALS. ? ?It is also possible that as with any injection, there may be an allergic reaction or no effect from the medication. Reduced effectiveness after repeated injections is sometimes seen and rarely infection at the injection site may occur. All care will be taken to prevent these side effects. If therapy is given over a long time, atrophy and wasting in the muscle injected may occur. Occasionally the patient's become refractory to treatment because they develop antibodies to the toxin. In this event, therapy needs to be modified. ? ?I have read the above information and consent to the administration of botulism toxin. ? ? ? ?BOTOX PROCEDURE NOTE FOR MIGRAINE HEADACHE ? ?Contraindications and precautions discussed with patient(above). Aseptic procedure was observed and patient tolerated procedure. Procedure performed by Debbora Presto, FNP-C.  ? ?The condition has existed for more than 6 months, and pt does not have a diagnosis of ALS, Myasthenia Gravis or Lambert-Eaton Syndrome.  Risks and benefits of injections discussed and pt agrees to proceed with the procedure.  Written consent obtained ? ?These injections are medically necessary. Pt  receives good benefits from these injections. These injections do not cause sedations or hallucinations which the oral therapies may cause. ? ? ?Description of procedure: ? ?The patient was placed in a sitting position. The standard protocol was used for Botox as follows, with 5 units of Botox injected at each site: ? ?-Procerus muscle, midline injection ? ?-Corrugator muscle, bilateral injection ? ?-Frontalis muscle, bilateral injection, with 2 sites each side, medial injection was performed in the upper one third of the frontalis muscle, in the  region vertical from the medial inferior edge of the superior orbital rim. The lateral injection was again in the upper one third of the forehead vertically above the lateral limbus of the cornea, 1.5 cm lateral to the medial injection  site. ? ?-Temporalis muscle injection, 4 sites, bilaterally. The first injection was 3 cm above the tragus of the ear, second injection site was 1.5 cm to 3 cm up from the first injection site in line with the tragus of the ear. The third injection site was 1.5-3 cm forward between the first 2 injection sites. The fourth injection site was 1.5 cm posterior to the second injection site. 5th site laterally in the temporalis  muscleat the level of the outer canthus. ? ?-Occipitalis muscle injection, 3 sites, bilaterally. The first injection was done one half way between the occipital protuberance and the tip of the mastoid process behind the ear. The second injection site was done lateral and superior to the first, 1 fingerbreadth from the first injection. The third injection site was 1 fingerbreadth superiorly and medially from the first injection site. ? ?-Cervical paraspinal muscle injection, 2 sites, bilaterally. The first injection site was 1 cm from the midline of the cervical spine, 3 cm inferior to the lower border of the occipital protuberance. The second injection site was 1.5 cm superiorly and laterally to the first injection site. ? ?-Trapezius muscle injection was performed at 3 sites, bilaterally. The first injection site was in the upper trapezius muscle halfway between the inflection point of the neck, and the acromion. The second injection site was one half way between the acromion and the first injection site. The third injection was done between the first injection site and the inflection point of the neck. ? ? ?Will return for repeat injection in 3 months. ? ? ?A total of 200 units of Botox was prepared, 155 units of Botox was injected as documented above, any Botox not injected was wasted. The patient tolerated the procedure well, there were no complications of the above procedure. ? ? ?

## 2021-04-09 NOTE — Progress Notes (Signed)
Botox- 100 units x 2 vial ?Lot: [id number] ?Expiration: 04/2023 ?[place]: [phone] ?  ?Bacteriostatic 0.9% Sodium Chloride- 28m total ?LFSE:[id number]?Ex[place]: 09/04/22 ?[place] [address]?  ?Dx: GU83.729?BB ?

## 2021-05-03 ENCOUNTER — Ambulatory Visit (INDEPENDENT_AMBULATORY_CARE_PROVIDER_SITE_OTHER): Payer: Medicare PPO

## 2021-05-03 VITALS — BP 120/70 | HR 59 | Wt 143.8 lb

## 2021-05-03 DIAGNOSIS — Z0000 Encounter for general adult medical examination without abnormal findings: Secondary | ICD-10-CM

## 2021-05-03 NOTE — Patient Instructions (Signed)
Ms. Kerman , ?Thank you for taking time to come for your Medicare Wellness Visit. I appreciate your ongoing commitment to your health goals. Please review the following plan we discussed and let me know if I can assist you in the future.  ? ?Screening recommendations/referrals: ?Colonoscopy: Done 08/14/20 repeat every 10 years  ?Mammogram: Done 10/18/20 repeat every year  ?Bone Density: Done 09/28/18 repeat every 2 years  ?Recommended yearly ophthalmology/optometry visit for glaucoma screening and checkup ?Recommended yearly dental visit for hygiene and checkup ? ?Vaccinations: ?Influenza vaccine: Done 10/19/20 repeat every year  ?Pneumococcal vaccine: Up to date ?Tdap vaccine: Done 09/02/20 repeat every 10 years  ?Shingles vaccine: Shingrix discussed. Please contact your pharmacy for coverage information.    ?Covid-19:Completed 1/24, 2/15, 11/04/19 & 5/10, 10/30/20 ? ?Advanced directives: Please bring a copy of your health care power of attorney and living will to the office at your convenience. ? ?Conditions/risks identified: Stay alive and active  ? ?Next appointment: Follow up in one year for your annual wellness visit  ? ? ?Preventive Care 73 Years and Older, Female ?Preventive care refers to lifestyle choices and visits with your health care provider that can promote health and wellness. ?What does preventive care include? ?A yearly physical exam. This is also called an annual well check. ?Dental exams once or twice a year. ?Routine eye exams. Ask your health care provider how often you should have your eyes checked. ?Personal lifestyle choices, including: ?Daily care of your teeth and gums. ?Regular physical activity. ?Eating a healthy diet. ?Avoiding tobacco and drug use. ?Limiting alcohol use. ?Practicing safe sex. ?Taking low-dose aspirin every day. ?Taking vitamin and mineral supplements as recommended by your health care provider. ?What happens during an annual well check? ?The services and screenings done  by your health care provider during your annual well check will depend on your age, overall health, lifestyle risk factors, and family history of disease. ?Counseling  ?Your health care provider may ask you questions about your: ?Alcohol use. ?Tobacco use. ?Drug use. ?Emotional well-being. ?Home and relationship well-being. ?Sexual activity. ?Eating habits. ?History of falls. ?Memory and ability to understand (cognition). ?Work and work Statistician. ?Reproductive health. ?Screening  ?You may have the following tests or measurements: ?Height, weight, and BMI. ?Blood pressure. ?Lipid and cholesterol levels. These may be checked every 5 years, or more frequently if you are over 73 years old. ?Skin check. ?Lung cancer screening. You may have this screening every year starting at age 73 if you have a 30-pack-year history of smoking and currently smoke or have quit within the past 15 years. ?Fecal occult blood test (FOBT) of the stool. You may have this test every year starting at age 73. ?Flexible sigmoidoscopy or colonoscopy. You may have a sigmoidoscopy every 5 years or a colonoscopy every 10 years starting at age 73. ?Hepatitis C blood test. ?Hepatitis B blood test. ?Sexually transmitted disease (STD) testing. ?Diabetes screening. This is done by checking your blood sugar (glucose) after you have not eaten for a while (fasting). You may have this done every 1-3 years. ?Bone density scan. This is done to screen for osteoporosis. You may have this done starting at age 73. ?Mammogram. This may be done every 1-2 years. Talk to your health care provider about how often you should have regular mammograms. ?Talk with your health care provider about your test results, treatment options, and if necessary, the need for more tests. ?Vaccines  ?Your health care provider may recommend certain vaccines, such as: ?  Influenza vaccine. This is recommended every year. ?Tetanus, diphtheria, and acellular pertussis (Tdap, Td) vaccine. You  may need a Td booster every 10 years. ?Zoster vaccine. You may need this after age 73. ?Pneumococcal 13-valent conjugate (PCV13) vaccine. One dose is recommended after age 58. ?Pneumococcal polysaccharide (PPSV23) vaccine. One dose is recommended after age 14. ?Talk to your health care provider about which screenings and vaccines you need and how often you need them. ?This information is not intended to replace advice given to you by your health care provider. Make sure you discuss any questions you have with your health care provider. ?Document Released: 02/16/2015 Document Revised: 10/10/2015 Document Reviewed: 11/21/2014 ?Elsevier Interactive Patient Education ? 2017 [place]. ? ?Fall Prevention in the Home ?Falls can cause injuries. They can happen to people of all ages. There are many things you can do to make your home safe and to help prevent falls. ?What can I do on the outside of my home? ?Regularly fix the edges of walkways and driveways and fix any cracks. ?Remove anything that might make you trip as you walk through a door, such as a raised step or threshold. ?Trim any bushes or trees on the path to your home. ?Use bright outdoor lighting. ?Clear any walking paths of anything that might make someone trip, such as rocks or tools. ?Regularly check to see if handrails are loose or broken. Make sure that both sides of any steps have handrails. ?Any raised decks and porches should have guardrails on the edges. ?Have any leaves, snow, or ice cleared regularly. ?Use sand or salt on walking paths during winter. ?Clean up any spills in your garage right away. This includes oil or grease spills. ?What can I do in the bathroom? ?Use night lights. ?Install grab bars by the toilet and in the tub and shower. Do not use towel bars as grab bars. ?Use non-skid mats or decals in the tub or shower. ?If you need to sit down in the shower, use a plastic, non-slip stool. ?Keep the floor dry. Clean up any water that spills  on the floor as soon as it happens. ?Remove soap buildup in the tub or shower regularly. ?Attach bath mats securely with double-sided non-slip rug tape. ?Do not have throw rugs and other things on the floor that can make you trip. ?What can I do in the bedroom? ?Use night lights. ?Make sure that you have a light by your bed that is easy to reach. ?Do not use any sheets or blankets that are too big for your bed. They should not hang down onto the floor. ?Have a firm chair that has side arms. You can use this for support while you get dressed. ?Do not have throw rugs and other things on the floor that can make you trip. ?What can I do in the kitchen? ?Clean up any spills right away. ?Avoid walking on wet floors. ?Keep items that you use a lot in easy-to-reach places. ?If you need to reach something above you, use a strong step stool that has a grab bar. ?Keep electrical cords out of the way. ?Do not use floor polish or wax that makes floors slippery. If you must use wax, use non-skid floor wax. ?Do not have throw rugs and other things on the floor that can make you trip. ?What can I do with my stairs? ?Do not leave any items on the stairs. ?Make sure that there are handrails on both sides of the stairs and use them.  Fix handrails that are broken or loose. Make sure that handrails are as long as the stairways. ?Check any carpeting to make sure that it is firmly attached to the stairs. Fix any carpet that is loose or worn. ?Avoid having throw rugs at the top or bottom of the stairs. If you do have throw rugs, attach them to the floor with carpet tape. ?Make sure that you have a light switch at the top of the stairs and the bottom of the stairs. If you do not have them, ask someone to add them for you. ?What else can I do to help prevent falls? ?Wear shoes that: ?Do not have high heels. ?Have rubber bottoms. ?Are comfortable and fit you well. ?Are closed at the toe. Do not wear sandals. ?If you use a stepladder: ?Make  sure that it is fully opened. Do not climb a closed stepladder. ?Make sure that both sides of the stepladder are locked into place. ?Ask someone to hold it for you, if possible. ?Clearly mark and make

## 2021-05-03 NOTE — Progress Notes (Signed)
? ?Subjective:  ? Suzanne [name] is a 71 y.o. female who presents for an Initial Medicare Annual Wellness Visit. ? ?Review of Systems    ? ?Cardiac Risk Factors include: advanced age (>62mn, >>65women);smoking/ tobacco exposure ? ?   ?Objective:  ?  ?Today's Vitals  ? 05/03/21 1256  ?BP: 120/70  ?Pulse: (!) 59  ?SpO2: 97%  ?Weight: 143 lb 12.8 oz (65.2 kg)  ? ?Body mass index is 22.52 kg/m?. ? ? ?  05/03/2021  ?  1:07 PM 09/01/2020  ?  9:21 PM 04/27/2020  ?  1:16 PM 03/25/2019  ? 11:49 AM 12/27/2018  ?  6:02 AM 12/17/2018  ? 11:31 AM 12/16/2016  ?  4:00 PM  ?Advanced Directives  ?Does Patient Have a Medical Advance Directive? Yes No Yes Yes Yes Yes Yes  ?Type of Advance Directive Healthcare Power of [place] will [place] will [place] will  ?Does patient want to make changes to medical advance directive?    No - Patient declined No - Patient declined  No - Patient declined  ?Copy of [place] Chart? No - copy requested  No - copy requested No - copy requested No - copy requested  No - copy requested  ?Would patient like information on creating a medical advance directive?  No - Patient declined       ? ? ?Current Medications (verified) ?Outpatient Encounter Medications as of 05/03/2021  ?Medication Sig  ? acetaminophen (TYLENOL) 325 MG tablet Take 325 mg 2 (two) times daily as needed by mouth (pain).   ? albuterol (VENTOLIN HFA) 108 (90 Base) MCG/ACT inhaler INHALE 2 PUFFS INTO THE LUNGS EVERY 6 HOURS AS NEEDED FOR WHEEZING OR SHORTNESS OF BREATH.  ? ALPRAZolam (XANAX) 0.5 MG tablet TAKE 1 TABLET BY MOUTH TWICE A DAY AS NEEDED FOR ANXIETY.  ? apixaban (ELIQUIS) 5 MG TABS tablet Take 1 tablet by mouth 2 times daily.  ? BOTOX 200 units SOLR INJECT 200 UNITS  INTRAMUSCULARLY EVERY 3  MONTHS (GIVEN AT MD OFFICE, DISCARD UNUSED PORTION   AFTER FIRST USE)  ? buPROPion (WELLBUTRIN XL) 300 MG 24 hr tablet TAKE 1 TABLET BY MOUTH EVERY MORNING  ? butalbital-acetaminophen-caffeine (FIORICET) 50-325-40 MG tablet TAKE 1 TABLET BY MOUTH EVERY 6 HOURS AS NEEDED FOR HEADACHE.  ? dorzolamide-timolol (COSOPT) 22.3-6.8 MG/ML ophthalmic solution PLACE 1 DROP IN BOTH EYES TWO TIMES DAILY  ? Hypromellose (ARTIFICIAL TEARS OP) Place 1 drop into both eyes daily as needed (dry eyes).   ? Probiotic Product (ALIGN PO) Take 4 mg by mouth daily.  ? promethazine (PHENERGAN) 25 MG tablet Take 1 tablet (25 mg total) by mouth every 6 (six) hours as needed for nausea or vomiting.  ? sertraline (ZOLOFT) 100 MG tablet TAKE 1 TABLET BY MOUTH ONCE A DAY.  ? Simethicone (GAS-X PO) Take 2 tablets daily as needed by mouth (gas).  ? SUMAtriptan (IMITREX) 100 MG tablet TAKE 1 TABLET BY MOUTH AT THE ONSET OF A HEADACHE DAILY AS NEEDED  ? sodium chloride (OCEAN) 0.65 % SOLN nasal spray Place 1 spray as needed into both nostrils for congestion.  ? ?Facility-Administered Encounter Medications as of 05/03/2021  ?Medication  ? botulinum toxin Type A (BOTOX) injection 200 Units  ? ? ?Allergies (verified) ?Dilaudid [hydromorphone hcl], Fentanyl, Ciprofloxacin, Erythromycin, and Percocet [oxycodone-acetaminophen]  ? ?History: ?Past Medical History:  ?Diagnosis Date  ?  AICD (automatic cardioverter/defibrillator) present   ? loop recorder  ? Anxiety   ? Anxiety state 12/14/2018  ? Asthma   ? Atrial fibrillation [facility])   ? s/p ablations  ? Atrial flutter ([place])   ? COPD (chronic obstructive pulmonary disease) ([place])   ? Degenerative arthritis   ? "maybe in my left knee" (12/16/2016)  ? Depression   ? Dysrhythmia 2010  ? A-fib  ? Frozen shoulder   ? hx bilaterally; "had to go thru PT; no problem w/them since I learned exercises" (12/16/2016)  ? GERD (gastroesophageal reflux disease)   ? Glaucoma   ? H/O: rheumatic fever 1957  ? High cholesterol   ? Migraine   ? "getting botox now; now only 2-3/month"  (12/16/2016)  ? PONV (postoperative nausea and vomiting)   ? with Fentanyl,  Dilaudid &  Propofol (12/16/2016)  ? ?Past Surgical History:  ?Procedure Laterality Date  ? ABDOMINAL HYSTERECTOMY  1990  ? right ovary removed  ? APPENDECTOMY  1990  ? ATRIAL FIBRILLATION ABLATION N/A 12/16/2016  ? Procedure: ATRIAL FIBRILLATION ABLATION;  Surgeon: Thompson Grayer, MD;  Location: [place] [facility];  Service: Cardiovascular;  Laterality: N/A;  ? CATARACT EXTRACTION, BILATERAL    ? CTI ablation  04/2009  ? ESOPHAGOGASTRODUODENOSCOPY (EGD) WITH PROPOFOL N/A 01/06/2013  ? Procedure: ESOPHAGOGASTRODUODENOSCOPY (EGD) WITH PROPOFOL;  Surgeon: Cleotis Nipper, MD;  Location[facility];  Service: Endoscopy;  Laterality: N/A;  ? implantable loop recorder removal  11/05/2020  ? MDT LINQ removed by Dr Rayann Heman  ? KNEE ARTHROSCOPY Bilateral 1999-2006  ? KNEE SURGERY Bilateral [id number]  ? "open; for congenital deformity"  ? LOOP RECORDER INSERTION N/A 10/02/2016  ? Procedure: LOOP RECORDER INSERTION;  Surgeon: Thompson Grayer, MD;  Location: [place] [facility];  Service: Cardiovascular;  Laterality: N/A;  ? PERIPHERAL VASCULAR INTERVENTION  2/11  & 6/11  ? RHINOPLASTY  1965  ? TONSILLECTOMY  as child  ? TOOTH EXTRACTION  2009  ? 2 teeth extractted  ? TOTAL KNEE ARTHROPLASTY Left 12/27/2018  ? Procedure: TOTAL KNEE ARTHROPLASTY;  Surgeon: Elsie Saas, MD;  Location: [facility];  Service: Orthopedics;  Laterality: Left;  ? ?Family History  ?Problem Relation Age of Onset  ? Cancer Mother   ?     renal cell  ? Migraines Mother   ? Dementia Father   ? Heart disease Father   ?     mid 88s  ? Other Brother   ?     died 79- out for run- no autopsy  ? Hyperlipidemia Brother   ? Breast cancer Neg Hx   ? ?Social History  ? ?Socioeconomic History  ? Marital status: Married  ?  Spouse name: Not on file  ? Number of children: 0  ? Years of education: 39  ? Highest education level: Not on file  ?Occupational History  ? 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  Employer: [employer]  ? Occupation: CLINICAL NURSE CARE  ?  Employer: [employer]  ?Tobacco Use  ? Smoking status: Every Day  ?  Packs/day: 0.25  ?  Years: 50.00  ?  Pack years: 12.50  ?  Types: Cigarettes  ? Smokeless tobacco: Never  ? Tobacco comments:  ?  Discussed reduce to quit, gave card with resources to help in the community  ?Vaping Use  ? Vaping Use: Never used  ?Substance and Sexual Activity  ? Alcohol use: Yes  ?  Comment: 12/16/2016 "  1-2 glasses of wine per year"  ? Drug use: No  ? Sexual activity: Not Currently  ?Other Topics Concern  ? Not on file  ?Social History Narrative  ? Lives at home w/ husband and  dog(s)  ? Married 1984. No children.   ? Retired Therapist, sports  ?   ? Enjoys reading- history, politics, classics, etc  ? ?Social Determinants of Health  ? ?Financial Resource Strain: Not on file  ?Food Insecurity: Not on file  ?Transportation Needs: Not on file  ?Physical Activity: Insufficiently Active  ? Days of Exercise per Week: 4 days  ? Minutes of Exercise per Session: 30 min  ?Stress: No Stress Concern Present  ? Feeling of Stress : Not at all  ?Social Connections: Socially Integrated  ? Frequency of Communication with Friends and Family: More than three times a week  ? Frequency of Social Gatherings with Friends and Family: More than three times a week  ? Attends Religious Services: 1 to 4 times per year  ? Active Member of Clubs or Organizations: Yes  ? Attends Archivist Meetings: 1 to 4 times per year  ? Marital Status: Married  ? ? ?Tobacco Counseling ?Ready to quit: Not Answered ?Counseling given: Not Answered ?Tobacco comments: Discussed reduce to quit, gave card with resources to help in the community ? ? ?Clinical Intake: ? ?Pre-visit preparation completed: Yes ? ?Pain : No/denies pain ? ?  ? ?BMI - recorded: 22.52 ?Nutritional Status: BMI of 19-24  Normal ?Nutritional Risks: None ?Diabetes: No ? ?How often do you need to have someone help you when you read  instructions, pamphlets, or other written materials from your doctor or pharmacy?: 1 - Never ? ?Diabetic?no ? ?Interpreter Needed?: No ? ?Information entered by :: Charlott Rakes, LPN ? ? ?Activities of Daily L

## 2021-05-13 ENCOUNTER — Other Ambulatory Visit (HOSPITAL_COMMUNITY): Payer: Self-pay

## 2021-05-13 ENCOUNTER — Other Ambulatory Visit: Payer: Self-pay | Admitting: Neurology

## 2021-05-13 MED ORDER — SUMATRIPTAN SUCCINATE 100 MG PO TABS
ORAL_TABLET | ORAL | 0 refills | Status: DC
  Filled 2021-05-13: qty 27, 90d supply, fill #0

## 2021-05-13 NOTE — Telephone Encounter (Signed)
Rx refilled.

## 2021-05-14 ENCOUNTER — Other Ambulatory Visit (HOSPITAL_COMMUNITY): Payer: Self-pay

## 2021-05-16 ENCOUNTER — Other Ambulatory Visit (HOSPITAL_COMMUNITY): Payer: Self-pay

## 2021-05-16 ENCOUNTER — Other Ambulatory Visit: Payer: Self-pay | Admitting: Neurology

## 2021-05-16 ENCOUNTER — Encounter: Payer: Self-pay | Admitting: Neurology

## 2021-05-16 MED ORDER — BUTALBITAL-APAP-CAFFEINE 50-325-40 MG PO TABS
ORAL_TABLET | ORAL | 0 refills | Status: DC
  Filled 2021-05-16: qty 30, 8d supply, fill #0

## 2021-05-20 ENCOUNTER — Ambulatory Visit: Payer: Medicare PPO | Admitting: Family

## 2021-05-20 ENCOUNTER — Encounter: Payer: Self-pay | Admitting: Family

## 2021-05-20 ENCOUNTER — Other Ambulatory Visit (HOSPITAL_COMMUNITY): Payer: Self-pay

## 2021-05-20 VITALS — BP 124/78 | HR 89 | Temp 98.4°F | Ht 67.0 in | Wt 142.0 lb

## 2021-05-20 DIAGNOSIS — J441 Chronic obstructive pulmonary disease with (acute) exacerbation: Secondary | ICD-10-CM | POA: Diagnosis not present

## 2021-05-20 MED ORDER — PREDNISONE 20 MG PO TABS
ORAL_TABLET | ORAL | 0 refills | Status: DC
  Filled 2021-05-20: qty 9, 7d supply, fill #0

## 2021-05-20 NOTE — Progress Notes (Signed)
? ?Subjective:  ? ? ? Patient ID: Suzanne [name], female    DOB: 10-30-1950, 71 y.o.   MRN: [id number] ? ?Chief Complaint  ?Patient presents with  ? Cough  ?  Pt c/o coughing and wheezing since 4/13. Pushed fluids, albuterol inhaler which does help.   ? Shortness of Breath  ? ?HPI: ?COPD, Follow up ?She reports good compliance with treatment. ?She is not having side effects.  ?she uses rescue inhaler 3 per day, recently due to exacerbation.She IS experiencing dyspnea, fatigue, pleuritic chest pain, wheezing, cough, and increased sputum starting a week ago. ?She is NOT experiencing weight loss, chills, or fever. ?she reports breathing is Worse currently due to recent virus and allergies. ? ? ?Health Maintenance Due  ?Topic Date Due  ? COVID-19 Vaccine (6 - Booster for [place] series) 12/25/2020  ? ? ?Past Medical History:  ?Diagnosis Date  ? AICD (automatic cardioverter/defibrillator) present   ? loop recorder  ? Anxiety   ? Anxiety state 12/14/2018  ? Asthma   ? Atrial fibrillation [facility])   ? s/p ablations  ? Atrial flutter ([place])   ? COPD (chronic obstructive pulmonary disease) ([place])   ? Degenerative arthritis   ? "maybe in my left knee" (12/16/2016)  ? Depression   ? Dysrhythmia 2010  ? A-fib  ? Frozen shoulder   ? hx bilaterally; "had to go thru PT; no problem w/them since I learned exercises" (12/16/2016)  ? GERD (gastroesophageal reflux disease)   ? Glaucoma   ? H/O: rheumatic fever 1957  ? High cholesterol   ? Migraine   ? "getting botox now; now only 2-3/month" (12/16/2016)  ? PONV (postoperative nausea and vomiting)   ? with Fentanyl,  Dilaudid &  Propofol (12/16/2016)  ? ? ?Past Surgical History:  ?Procedure Laterality Date  ? ABDOMINAL HYSTERECTOMY  1990  ? right ovary removed  ? APPENDECTOMY  1990  ? ATRIAL FIBRILLATION ABLATION N/A 12/16/2016  ? Procedure: ATRIAL FIBRILLATION ABLATION;  Surgeon: Thompson Grayer, MD;  Location: [place] [facility];  Service: Cardiovascular;  Laterality: N/A;  ? CATARACT  EXTRACTION, BILATERAL    ? CTI ablation  04/2009  ? ESOPHAGOGASTRODUODENOSCOPY (EGD) WITH PROPOFOL N/A 01/06/2013  ? Procedure: ESOPHAGOGASTRODUODENOSCOPY (EGD) WITH PROPOFOL;  Surgeon: Cleotis Nipper, MD;  [facility];  Service: Endoscopy;  Laterality: N/A;  ? implantable loop recorder removal  11/05/2020  ? MDT LINQ removed by Dr Rayann Heman  ? KNEE ARTHROSCOPY Bilateral 1999-2006  ? KNEE SURGERY Bilateral [id number]  ? "open; for congenital deformity"  ? LOOP RECORDER INSERTION N/A 10/02/2016  ? Procedure: LOOP RECORDER INSERTION;  Surgeon: Thompson Grayer, MD;  Location: [place] [facility];  Service: Cardiovascular;  Laterality: N/A;  ? PERIPHERAL VASCULAR INTERVENTION  2/11  & 6/11  ? RHINOPLASTY  1965  ? TONSILLECTOMY  as child  ? TOOTH EXTRACTION  2009  ? 2 teeth extractted  ? TOTAL KNEE ARTHROPLASTY Left 12/27/2018  ? Procedure: TOTAL KNEE ARTHROPLASTY;  Surgeon: Elsie Saas, MD;  [facility];  Service: Orthopedics;  Laterality: Left;  ? ? ?Outpatient Medications Prior to Visit  ?Medication Sig Dispense Refill  ? acetaminophen (TYLENOL) 325 MG tablet Take 325 mg 2 (two) times daily as needed by mouth (pain).     ? albuterol (VENTOLIN HFA) 108 (90 Base) MCG/ACT inhaler INHALE 2 PUFFS INTO THE LUNGS EVERY 6 HOURS AS NEEDED FOR WHEEZING OR SHORTNESS OF BREATH. 18 g 2  ? ALPRAZolam (XANAX) 0.5 MG tablet TAKE 1 TABLET  BY MOUTH TWICE A DAY AS NEEDED FOR ANXIETY. 60 tablet 2  ? apixaban (ELIQUIS) 5 MG TABS tablet Take 1 tablet by mouth 2 times daily. 180 tablet 1  ? BOTOX 200 units SOLR INJECT 200 UNITS  INTRAMUSCULARLY EVERY 3  MONTHS (GIVEN AT MD OFFICE, DISCARD UNUSED PORTION  AFTER FIRST USE) 1 each 3  ? buPROPion (WELLBUTRIN XL) 300 MG 24 hr tablet TAKE 1 TABLET BY MOUTH EVERY MORNING 90 tablet 2  ? butalbital-acetaminophen-caffeine (FIORICET) 50-325-40 MG tablet TAKE 1 TABLET BY MOUTH EVERY 6 HOURS AS NEEDED FOR HEADACHE. 30 tablet 0  ? dorzolamide-timolol (COSOPT) 22.3-6.8 MG/ML ophthalmic  solution PLACE 1 DROP IN BOTH EYES TWO TIMES DAILY 20 mL 4  ? Hypromellose (ARTIFICIAL TEARS OP) Place 1 drop into both eyes daily as needed (dry eyes).     ? Probiotic Product (ALIGN PO) Take 4 mg by mouth daily.    ? promethazine (PHENERGAN) 25 MG tablet Take 1 tablet (25 mg total) by mouth every 6 (six) hours as needed for nausea or vomiting. 20 tablet 1  ? sertraline (ZOLOFT) 100 MG tablet TAKE 1 TABLET BY MOUTH ONCE A DAY. 90 tablet 2  ? Simethicone (GAS-X PO) Take 2 tablets daily as needed by mouth (gas).    ? sodium chloride (OCEAN) 0.65 % SOLN nasal spray Place 1 spray as needed into both nostrils for congestion.    ? SUMAtriptan (IMITREX) 100 MG tablet TAKE 1 TABLET BY MOUTH AT THE ONSET OF A HEADACHE DAILY AS NEEDED 27 tablet 0  ? ?Facility-Administered Medications Prior to Visit  ?Medication Dose Route Frequency Provider Last Rate Last Admin  ? botulinum toxin Type A (BOTOX) injection 200 Units  200 Units Intramuscular Once Kathrynn Ducking, MD      ? ? ?Allergies  ?Allergen Reactions  ? Dilaudid [Hydromorphone Hcl] Nausea And Vomiting  ? Fentanyl Nausea And Vomiting  ? Ciprofloxacin Rash  ? Erythromycin Rash  ? Percocet [Oxycodone-Acetaminophen] Nausea And Vomiting  ? ? ? ?   ?Objective:  ?  ?Physical Exam ?Vitals and nursing note reviewed.  ?Constitutional:   ?   Appearance: Normal appearance.  ?Cardiovascular:  ?   Rate and Rhythm: Normal rate and regular rhythm.  ?Pulmonary:  ?   Effort: Pulmonary effort is normal.  ?   Breath sounds: Normal breath sounds.  ?Musculoskeletal:     ?   General: Normal range of motion.  ?Skin: ?   General: Skin is warm and dry.  ?Neurological:  ?   Mental Status: She is alert.  ?Psychiatric:     ?   Mood and Affect: Mood normal.     ?   Behavior: Behavior normal.  ? ? ?BP 124/78 (BP Location: Left Arm, Patient Position: Sitting, Cuff Size: Large)   Pulse 89   Temp 98.4 ?F (36.9 ?C) (Temporal)   Ht '5\' 7"'$  (1.702 m)   Wt 142 lb (64.4 kg)   SpO2 94%   BMI 22.24 kg/m?   ?Wt Readings from Last 3 Encounters:  ?05/20/21 142 lb (64.4 kg)  ?05/03/21 143 lb 12.8 oz (65.2 kg)  ?03/18/21 146 lb (66.2 kg)  ? ? ?   ?Assessment & Plan:  ? ?Problem List Items Addressed This Visit   ?None ?Visit Diagnoses   ? ? COPD with acute exacerbation ([place])    -  Primary  ? only uses rescue inhaler and has not had any problems for a long time. Possibly allergy season is worsening symptoms,  pt still smokes a few cigs daily. Sending prednisone, advised on use & SE, advised she may want to discuss a steroid inhaler to use during allergy seasons to help prevent these episodes. pt drinking plenty of water. ? ?Relevant Medications  ? predniSONE (DELTASONE) 20 MG tablet  ? ?  ? ?Meds ordered this encounter  ?Medications  ? predniSONE (DELTASONE) 20 MG tablet  ?  Sig: Take 2 pills in the morning with breakfast for 3 days, then 1 pill for 2 days. then 1/2 pill for 2 days  ?  Dispense:  9 tablet  ?  Refill:  0  ?  Order Specific Question:   Supervising Provider  ?  Answer:   ANDY, CAMILLE L [[id number]]  ? ? ?Jeanie Sewer, NP ? ?

## 2021-05-20 NOTE — Patient Instructions (Signed)
It was very nice to see you today! ? ?I have sent prednisone to your pharmacy. If you want to start this today, take 1 pill, and then start the 2 pills in the morning for 3 days, then 1 pill x2d, and 1/2 pill for a day. ?Continue to drink at least 2Liters of water daily. ?Continue to use the albuterol inhaler 3-4 times per day for next few days, then back off.  ?If your symptoms are not improving, discuss starting a steroid inhaler with Dr. Yong Channel. ? ? ? ? ? ?PLEASE NOTE: ? ?If you had any lab tests please let us know if you have not heard back within a few days. You may see your results on MyChart before we have a chance to review them but we will give you a call once they are reviewed by [place]. If we ordered any referrals today, please let us know if you have not heard from their office within the next week.  ? ? ?

## 2021-05-30 ENCOUNTER — Other Ambulatory Visit (HOSPITAL_COMMUNITY): Payer: Self-pay

## 2021-06-04 ENCOUNTER — Other Ambulatory Visit (HOSPITAL_COMMUNITY): Payer: Self-pay

## 2021-06-04 MED ORDER — ALPRAZOLAM 0.5 MG PO TABS
ORAL_TABLET | ORAL | 2 refills | Status: DC
  Filled 2021-06-04: qty 60, 30d supply, fill #0
  Filled 2021-07-16: qty 60, 30d supply, fill #1
  Filled 2021-08-20: qty 60, 30d supply, fill #2
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
## 2021-06-05 ENCOUNTER — Encounter: Payer: Self-pay | Admitting: Neurology

## 2021-06-05 ENCOUNTER — Other Ambulatory Visit (HOSPITAL_COMMUNITY): Payer: Self-pay

## 2021-06-05 ENCOUNTER — Ambulatory Visit: Payer: Medicare PPO | Admitting: Neurology

## 2021-06-05 VITALS — BP 119/72 | HR 82 | Ht 67.0 in | Wt 141.0 lb

## 2021-06-05 DIAGNOSIS — G43719 Chronic migraine without aura, intractable, without status migrainosus: Secondary | ICD-10-CM

## 2021-06-05 MED ORDER — AJOVY 225 MG/1.5ML ~~LOC~~ SOAJ
225.0000 mg | SUBCUTANEOUS | 11 refills | Status: DC
  Filled 2021-06-05: qty 1.5, 30d supply, fill #0

## 2021-06-05 NOTE — Patient Instructions (Signed)
I will check on coverage of adding CGRP for migraine prevention, I will let you know ?For now, lets keep things as is, come back in 6 months with Dr. Jaynee Eagles  ?

## 2021-06-06 ENCOUNTER — Telehealth: Payer: Self-pay | Admitting: *Deleted

## 2021-06-06 ENCOUNTER — Other Ambulatory Visit (HOSPITAL_COMMUNITY): Payer: Self-pay

## 2021-06-06 MED ORDER — EMGALITY 120 MG/ML ~~LOC~~ SOAJ
SUBCUTANEOUS | 0 refills | Status: DC
  Filled 2021-06-06: qty 2, 30d supply, fill #0

## 2021-06-06 MED ORDER — EMGALITY 120 MG/ML ~~LOC~~ SOAJ
120.0000 mg | SUBCUTANEOUS | 3 refills | Status: DC
  Filled 2021-06-06: qty 3, fill #0
  Filled 2021-07-04: qty 3, 90d supply, fill #0
  Filled 2021-09-19: qty 3, 90d supply, fill #1

## 2021-06-06 NOTE — Telephone Encounter (Signed)
Ajovy is non-preferred on her [facility] plan. Per vo by Judson Roch, send in new rx for [facility] '120mg'$ , injection 240 first month for loading dose then '120mg'$  per month thereafter. ? ?The patient is aware of this medication change.  ? ?PA for Emgality started on covermymeds (key: [id number]). Pharmacy coverage through [place] [phone]). PA Case: [id number] approved through 09/04/2021.  ?

## 2021-06-19 ENCOUNTER — Other Ambulatory Visit (HOSPITAL_COMMUNITY): Payer: Self-pay

## 2021-06-19 DIAGNOSIS — F331 Major depressive disorder, recurrent, moderate: Secondary | ICD-10-CM | POA: Diagnosis not present

## 2021-06-19 DIAGNOSIS — F411 Generalized anxiety disorder: Secondary | ICD-10-CM | POA: Diagnosis not present

## 2021-06-19 MED ORDER — SERTRALINE HCL 100 MG PO TABS
ORAL_TABLET | ORAL | 1 refills | Status: DC
  Filled 2021-06-25: qty 90, 90d supply, fill #0

## 2021-06-19 MED ORDER — BUPROPION HCL ER (XL) 300 MG PO TB24
300.0000 mg | ORAL_TABLET | Freq: Every morning | ORAL | 1 refills | Status: DC
  Filled 2021-06-25: qty 90, 90d supply, fill #0

## 2021-06-19 MED ORDER — ALPRAZOLAM 0.5 MG PO TABS: ORAL_TABLET | ORAL | 2 refills | Status: DC

## 2021-06-25 ENCOUNTER — Other Ambulatory Visit (HOSPITAL_COMMUNITY): Payer: Self-pay

## 2021-06-27 ENCOUNTER — Other Ambulatory Visit (HOSPITAL_COMMUNITY): Payer: Self-pay

## 2021-06-27 MED ORDER — DORZOLAMIDE HCL-TIMOLOL MAL 2-0.5 % OP SOLN
OPHTHALMIC | 4 refills | Status: DC
  Filled 2021-06-27: qty 20, 80d supply, fill #0

## 2021-07-03 ENCOUNTER — Other Ambulatory Visit (HOSPITAL_COMMUNITY): Payer: Self-pay

## 2021-07-03 ENCOUNTER — Other Ambulatory Visit: Payer: Self-pay | Admitting: Internal Medicine

## 2021-07-03 DIAGNOSIS — I480 Paroxysmal atrial fibrillation: Secondary | ICD-10-CM

## 2021-07-03 MED ORDER — APIXABAN 5 MG PO TABS
5.0000 mg | ORAL_TABLET | Freq: Two times a day (BID) | ORAL | 0 refills | Status: DC
Start: 2021-07-03 — End: 2021-08-27
  Filled 2021-07-03: qty 180, 90d supply, fill #0

## 2021-07-03 NOTE — Telephone Encounter (Signed)
Prescription refill request for Eliquis received. Indication: PAF Last office visit: 11/05/20  Lenna Sciara Allred MD Scr: 0.74 on 07/11/20 Age: 71 Weight: 65.7kg  Based on above findings Eliquis '5mg'$  twice daily is the appropriate dose.  Refill approved.

## 2021-07-04 ENCOUNTER — Other Ambulatory Visit (HOSPITAL_COMMUNITY): Payer: Self-pay

## 2021-07-05 ENCOUNTER — Other Ambulatory Visit (HOSPITAL_COMMUNITY): Payer: Self-pay

## 2021-07-10 ENCOUNTER — Ambulatory Visit: Payer: Medicare PPO | Admitting: Neurology

## 2021-07-10 ENCOUNTER — Encounter: Payer: Self-pay | Admitting: Neurology

## 2021-07-10 VITALS — BP 140/67 | HR 66 | Ht 67.0 in | Wt 141.0 lb

## 2021-07-10 DIAGNOSIS — G43719 Chronic migraine without aura, intractable, without status migrainosus: Secondary | ICD-10-CM

## 2021-07-10 NOTE — Progress Notes (Signed)
Botox- 100 units x 2 vials Lot: [id number] Expiration: 01/2024 NDC: 1173-5670-14   B/B

## 2021-07-10 NOTE — Procedures (Signed)
    BOTOX PROCEDURE NOTE FOR MIGRAINE HEADACHE   HISTORY: Suzanne [name] is here today for Botox injection for chronic migraine headaches.  Botox continues to be quite beneficial.  Her migraines are essentially resolved.  She continues to have frequent tension type headaches.  She started Emgality injection in May.  She has done well to reduce her dose of Fioricet, only took 5 in May. Had 3 migraines in May, took Imitrex. She is planning to go to [place] in August, she may skip her next Botox in August and stay on Emgality alone, isn't sure she wants to stay both Emgality and Botox.   Description of procedure:  The patient was placed in a sitting position. The standard protocol was used for Botox as follows, with 5 units of Botox injected at each site:   -Procerus muscle, midline injection  -Corrugator muscle, bilateral injection  -Frontalis muscle, bilateral injection, with 2 sites each side, medial injection was performed in the upper one third of the frontalis muscle, in the region vertical from the medial inferior edge of the superior orbital rim. The lateral injection was again in the upper one third of the forehead vertically above the lateral limbus of the cornea, 1.5 cm lateral to the medial injection site.  -Temporalis muscle injection, 4 sites, bilaterally. The first injection was 3 cm above the tragus of the ear, second injection site was 1.5 cm to 3 cm up from the first injection site in line with the tragus of the ear. The third injection site was 1.5-3 cm forward between the first 2 injection sites. The fourth injection site was 1.5 cm posterior to the second injection site.  -Occipitalis muscle injection, 3 sites, bilaterally. The first injection was done one half way between the occipital protuberance and the tip of the mastoid process behind the ear. The second injection site was done lateral and superior to the first, 1 fingerbreadth from the first injection. The third  injection site was 1 fingerbreadth superiorly and medially from the first injection site.  -Cervical paraspinal muscle injection, 2 sites, bilateral, the first injection site was 1 cm from the midline of the cervical spine, 3 cm inferior to the lower border of the occipital protuberance. The second injection site was 1.5 cm superiorly and laterally to the first injection site.  -Trapezius muscle injection was performed at 3 sites, bilaterally. The first injection site was in the upper trapezius muscle halfway between the inflection point of the neck, and the acromion. The second injection site was one half way between the acromion and the first injection site. The third injection was done between the first injection site and the inflection point of the neck.   A 200 unit bottle of Botox was used, 155 units were injected, the rest of the Botox was wasted. The patient tolerated the procedure well, there were no complications of the above procedure.  Botox NDC 1694-5038-88 Lot number [id number] Expiration date 01/2024 B/B

## 2021-07-16 ENCOUNTER — Other Ambulatory Visit (HOSPITAL_COMMUNITY): Payer: Self-pay

## 2021-07-16 ENCOUNTER — Other Ambulatory Visit: Payer: Self-pay

## 2021-07-16 ENCOUNTER — Other Ambulatory Visit: Payer: Self-pay | Admitting: Family Medicine

## 2021-07-16 MED ORDER — DORZOLAMIDE HCL-TIMOLOL MAL 2-0.5 % OP SOLN
OPHTHALMIC | 4 refills | Status: DC
  Filled 2021-07-16: qty 20, fill #0
  Filled 2021-09-18: qty 20, 90d supply, fill #0
  Filled 2021-12-13: qty 20, 90d supply, fill #1
  Filled 2022-03-13: qty 20, 90d supply, fill #2
  Filled 2022-06-27: qty 20, 90d supply, fill #3

## 2021-08-01 ENCOUNTER — Ambulatory Visit (INDEPENDENT_AMBULATORY_CARE_PROVIDER_SITE_OTHER): Payer: Medicare PPO | Admitting: Family Medicine

## 2021-08-01 ENCOUNTER — Encounter: Payer: Self-pay | Admitting: Family Medicine

## 2021-08-01 ENCOUNTER — Other Ambulatory Visit (HOSPITAL_COMMUNITY): Payer: Self-pay

## 2021-08-01 VITALS — BP 138/80 | HR 65 | Temp 97.9°F | Ht 67.0 in | Wt 140.8 lb

## 2021-08-01 DIAGNOSIS — R7989 Other specified abnormal findings of blood chemistry: Secondary | ICD-10-CM | POA: Diagnosis not present

## 2021-08-01 DIAGNOSIS — F325 Major depressive disorder, single episode, in full remission: Secondary | ICD-10-CM

## 2021-08-01 DIAGNOSIS — R0789 Other chest pain: Secondary | ICD-10-CM | POA: Diagnosis not present

## 2021-08-01 DIAGNOSIS — Z720 Tobacco use: Secondary | ICD-10-CM

## 2021-08-01 DIAGNOSIS — E7800 Pure hypercholesterolemia, unspecified: Secondary | ICD-10-CM

## 2021-08-01 DIAGNOSIS — I480 Paroxysmal atrial fibrillation: Secondary | ICD-10-CM

## 2021-08-01 DIAGNOSIS — E559 Vitamin D deficiency, unspecified: Secondary | ICD-10-CM | POA: Diagnosis not present

## 2021-08-01 DIAGNOSIS — J449 Chronic obstructive pulmonary disease, unspecified: Secondary | ICD-10-CM

## 2021-08-01 DIAGNOSIS — Z0000 Encounter for general adult medical examination without abnormal findings: Secondary | ICD-10-CM | POA: Diagnosis not present

## 2021-08-01 LAB — CBC WITH DIFFERENTIAL/PLATELET
Basophils Absolute: 0 10*3/uL (ref 0.0–0.1)
Basophils Relative: 0.7 % (ref 0.0–3.0)
Eosinophils Absolute: 0.1 10*3/uL (ref 0.0–0.7)
Eosinophils Relative: 1.2 % (ref 0.0–5.0)
HCT: 40.6 % (ref 36.0–46.0)
Hemoglobin: 13.5 g/dL (ref 12.0–15.0)
Lymphocytes Relative: 32.6 % (ref 12.0–46.0)
Lymphs Abs: 2.1 10*3/uL (ref 0.7–4.0)
MCHC: 33.3 g/dL (ref 30.0–36.0)
MCV: 96.2 fl (ref 78.0–100.0)
Monocytes Absolute: 0.6 10*3/uL (ref 0.1–1.0)
Monocytes Relative: 9.3 % (ref 3.0–12.0)
Neutro Abs: 3.6 10*3/uL (ref 1.4–7.7)
Neutrophils Relative %: 56.2 % (ref 43.0–77.0)
Platelets: 165 10*3/uL (ref 150.0–400.0)
RBC: 4.22 Mil/uL (ref 3.87–5.11)
RDW: 12.9 % (ref 11.5–15.5)
WBC: 6.5 10*3/uL (ref 4.0–10.5)

## 2021-08-01 LAB — POC URINALSYSI DIPSTICK (AUTOMATED)
Bilirubin, UA: NEGATIVE
Blood, UA: NEGATIVE
Glucose, UA: NEGATIVE
Ketones, UA: NEGATIVE
Nitrite, UA: NEGATIVE
Protein, UA: NEGATIVE
Spec Grav, UA: 1.015 (ref 1.010–1.025)
Urobilinogen, UA: 0.2 E.U./dL
pH, UA: 6.5 (ref 5.0–8.0)

## 2021-08-01 LAB — COMPREHENSIVE METABOLIC PANEL
ALT: 9 U/L (ref 0–35)
AST: 12 U/L (ref 0–37)
Albumin: 4.5 g/dL (ref 3.5–5.2)
Alkaline Phosphatase: 76 U/L (ref 39–117)
BUN: 13 mg/dL (ref 6–23)
CO2: 29 mEq/L (ref 19–32)
Calcium: 9.6 mg/dL (ref 8.4–10.5)
Chloride: 102 mEq/L (ref 96–112)
Creatinine, Ser: 0.78 mg/dL (ref 0.40–1.20)
GFR: 75.71 mL/min (ref >60.00)
Glucose, Bld: 91 mg/dL (ref 70–99)
Potassium: 4.2 mEq/L (ref 3.5–5.1)
Sodium: 137 mEq/L (ref 135–145)
Total Bilirubin: 0.6 mg/dL (ref 0.2–1.2)
Total Protein: 6.6 g/dL (ref 6.0–8.3)

## 2021-08-01 LAB — LIPID PANEL
Cholesterol: 264 mg/dL — ABNORMAL HIGH (ref 0–200)
HDL: 71.9 mg/dL (ref >39.00)
LDL Cholesterol: 171 mg/dL — ABNORMAL HIGH (ref 0–99)
NonHDL: 192.56
Total CHOL/HDL Ratio: 4
Triglycerides: 107 mg/dL (ref 0.0–149.0)
VLDL: 21.4 mg/dL (ref 0.0–40.0)

## 2021-08-01 LAB — VITAMIN D 25 HYDROXY (VIT D DEFICIENCY, FRACTURES): VITD: 28.63 ng/mL — ABNORMAL LOW (ref 30.00–100.00)

## 2021-08-01 NOTE — Progress Notes (Signed)
Phone [phone]   Subjective:  Patient presents today for their annual physical. Chief complaint-noted.   See problem oriented charting- ROS- full  review of systems was completed and negative except for: palpitations, wheezing ocasional, headaches, constipation or diarrhea intermittent- gets some pain but decreased over time- has seen with Dr. Cristina Gong- feels like diet related  The following were reviewed and entered/updated in epic: Past Medical History:  Diagnosis Date   AICD (automatic cardioverter/defibrillator) present    loop recorder   Anxiety    Anxiety state 12/14/2018   Asthma    Atrial fibrillation ([place])    s/p ablations   Atrial flutter ([place])    COPD (chronic obstructive pulmonary disease) ([place])    Degenerative arthritis    "maybe in my left knee" (12/16/2016)   Depression    Dysrhythmia 2010   A-fib   Frozen shoulder    hx bilaterally; "had to go thru PT; no problem w/them since I learned exercises" (12/16/2016)   GERD (gastroesophageal reflux disease)    Glaucoma    H/O: rheumatic fever 1957   High cholesterol    Migraine    "getting botox now; now only 2-3/month" (12/16/2016)   PONV (postoperative nausea and vomiting)    with Fentanyl,  Dilaudid &  Propofol (12/16/2016)   Patient Active Problem List   Diagnosis Date Noted   Paroxysmal atrial fibrillation [facility]) s/p ablation november 2018, 2011 previously 12/16/2016    Priority: High   Tobacco abuse 10/03/2013    Priority: High   COPD (chronic obstructive pulmonary disease) ([place]) 02/01/2009    Priority: High   Glaucoma 01/08/2018    Priority: Medium    Primary localized osteoarthritis of left knee 01/08/2018    Priority: Medium    High cholesterol     Priority: Medium    Intractable chronic migraine without aura 05/17/2014    Priority: Medium    Depression, major, single episode, complete remission ([place]) 02/01/2009    Priority: Medium    Asthma 02/01/2009    Priority: Medium    Low vitamin D  level 01/08/2018    Priority: Low   Nasal injury 10/20/2016    Priority: Low   S/P ablation of atrial fibrillation 05/16/2013    Priority: Low   Anxiety state 12/14/2018   Past Surgical History:  Procedure Laterality Date   ABDOMINAL HYSTERECTOMY  1990   right ovary removed   No Name N/A 12/16/2016   Procedure: ATRIAL FIBRILLATION ABLATION;  Surgeon: Thompson Grayer, MD;  Location: [place] [facility];  Service: Cardiovascular;  Laterality: N/A;   CATARACT EXTRACTION, BILATERAL     CTI ablation  04/2009   ESOPHAGOGASTRODUODENOSCOPY (EGD) WITH PROPOFOL N/A 01/06/2013   Procedure: ESOPHAGOGASTRODUODENOSCOPY (EGD) WITH PROPOFOL;  Surgeon: Cleotis Nipper, MD;  Location: [facility];  Service: Endoscopy;  Laterality: N/A;   implantable loop recorder removal  11/05/2020   MDT LINQ removed by Dr Rayann Heman   KNEE ARTHROSCOPY Bilateral 1999-2006   KNEE SURGERY Bilateral [id number]   "open; for congenital deformity"   LOOP RECORDER INSERTION N/A 10/02/2016   Procedure: LOOP RECORDER INSERTION;  Surgeon: Thompson Grayer, MD;  Location: [place] [facility];  Service: Cardiovascular;  Laterality: N/A;   PERIPHERAL VASCULAR INTERVENTION  2/11  & 6/11   RHINOPLASTY  1965   TONSILLECTOMY  as child   TOOTH EXTRACTION  2009   2 teeth extractted   TOTAL KNEE ARTHROPLASTY Left 12/27/2018   Procedure: TOTAL KNEE ARTHROPLASTY;  Surgeon:  Elsie Saas, MD;  Location: [facility];  Service: Orthopedics;  Laterality: Left;    Family History  Problem Relation Age of Onset   Cancer Mother        renal cell   Migraines Mother    Dementia Father    Heart disease Father        mid 51s cabg   Other Brother        died 44- out for run- no autopsy   Hyperlipidemia Brother        doesnt tolerate statin- now 56   Breast cancer Neg Hx     Medications- reviewed and updated Current Outpatient Medications  Medication Sig Dispense Refill   acetaminophen (TYLENOL) 325 MG  tablet Take 325 mg 2 (two) times daily as needed by mouth (pain).      albuterol (VENTOLIN HFA) 108 (90 Base) MCG/ACT inhaler INHALE 2 PUFFS INTO THE LUNGS EVERY 6 HOURS AS NEEDED FOR WHEEZING OR SHORTNESS OF BREATH. 18 g 2   ALPRAZolam (XANAX) 0.5 MG tablet TAKE 1 TABLET BY MOUTH TWICE A DAY AS NEEDED FOR ANXIETY 60 tablet 2   ALPRAZolam (XANAX) 0.5 MG tablet TAKE 1 TABLET BY MOUTH TWICE A DAY AS NEEDED FOR ANXIETY 60 tablet 2   apixaban (ELIQUIS) 5 MG TABS tablet Take 1 tablet by mouth 2 times daily. 180 tablet 0   BOTOX 200 units SOLR INJECT 200 UNITS  INTRAMUSCULARLY EVERY 3  MONTHS (GIVEN AT MD OFFICE, DISCARD UNUSED PORTION  AFTER FIRST USE) 1 each 3   buPROPion (WELLBUTRIN XL) 300 MG 24 hr tablet TAKE 1 TABLET BY MOUTH EVERY MORNING 90 tablet 2   buPROPion (WELLBUTRIN XL) 300 MG 24 hr tablet TAKE 1 TABLET BY MOUTH EVERY MORNING 90 tablet 1   butalbital-acetaminophen-caffeine (FIORICET) 50-325-40 MG tablet TAKE 1 TABLET BY MOUTH EVERY 6 HOURS AS NEEDED FOR HEADACHE. 30 tablet 0   dorzolamide-timolol (COSOPT) 22.3-6.8 MG/ML ophthalmic solution PLACE 1 DROP IN BOTH EYES TWO TIMES DAILY 20 mL 4   Galcanezumab-gnlm (EMGALITY) 120 MG/ML SOAJ Inject '240mg'$  first month for loading dose then '120mg'$  each month thereafter for maintenance dose. 2 mL 0   Galcanezumab-gnlm (EMGALITY) 120 MG/ML SOAJ Inject 120 mg as directed every 30 days. 3 mL 3   Hypromellose (ARTIFICIAL TEARS OP) Place 1 drop into both eyes daily as needed (dry eyes).      Probiotic Product (ALIGN PO) Take 4 mg by mouth daily.     promethazine (PHENERGAN) 25 MG tablet Take 1 tablet (25 mg total) by mouth every 6 (six) hours as needed for nausea or vomiting. 20 tablet 1   sertraline (ZOLOFT) 100 MG tablet TAKE 1 TABLET BY MOUTH ONCE A DAY. 90 tablet 2   sertraline (ZOLOFT) 100 MG tablet Take 1 tablet by mouth once a day 90 tablet 1   Simethicone (GAS-X PO) Take 2 tablets daily as needed by mouth (gas).     sodium chloride (OCEAN) 0.65 %  SOLN nasal spray Place 1 spray as needed into both nostrils for congestion.     SUMAtriptan (IMITREX) 100 MG tablet TAKE 1 TABLET BY MOUTH AT THE ONSET OF A HEADACHE DAILY AS NEEDED 27 tablet 0   Current Facility-Administered Medications  Medication Dose Route Frequency Provider Last Rate Last Admin   botulinum toxin Type A (BOTOX) injection 200 Units  200 Units Intramuscular Once Kathrynn Ducking, MD        Allergies-reviewed and updated Allergies  Allergen Reactions   Dilaudid [  Hydromorphone Hcl] Nausea And Vomiting   Fentanyl Nausea And Vomiting   Ciprofloxacin Rash   Erythromycin Rash   Percocet [Oxycodone-Acetaminophen] Nausea And Vomiting    Social History   Social History Narrative   Lives at home w/ husband and  dog(s)   Married 1984. No children.    Retired Therapist, sports      Enjoys reading- history, Office manager, classics, etc   Objective  Objective:  BP 138/80   Pulse 65   Temp 97.9 F (36.6 C)   Ht '5\' 7"'$  (1.702 m)   Wt 140 lb 12.8 oz (63.9 kg)   SpO2 95%   BMI 22.05 kg/m  Gen: NAD, resting comfortably HEENT: Mucous membranes are moist. Oropharynx normal Neck: no thyromegaly CV: RRR no murmurs rubs or gallops Lungs: CTAB no crackles, wheeze, rhonchi Abdomen: soft/nontender/nondistended/normal bowel sounds. No rebound or guarding.  Ext: no edema Skin: warm, dry Neuro: grossly normal, moves all extremities, PERRLA   Assessment and Plan   71 y.o. female presenting for annual physical.  Health Maintenance counseling: 1. Anticipatory guidance: Patient counseled regarding regular dental exams -q6 months, eye exams - more than yearly,  avoiding smoking and second hand smoke, limiting alcohol to 1 beverage per day- rare sips she states (can trigger migraines) , no illicit drugs .   2. Risk factor reduction:  Advised patient of need for regular exercise and diet rich and fruits and vegetables to reduce risk of heart attack and stroke.  Exercise- walking daily 15-20 mins.   Diet/weight management-down 4 lbs from last year- she has some variability but eats very healthy - very minimal meat intake.  Wt Readings from Last 3 Encounters:  08/01/21 140 lb 12.8 oz (63.9 kg)  07/10/21 141 lb (64 kg)  06/05/21 141 lb (64 kg)  3. Immunizations/screenings/ancillary studies- shingles recommended at pharmacy, planning on doing this before foreign travel- covid shot Immunization History  Administered Date(s) Administered   Fluad Quad(high Dose 65+) 10/19/2020   Influenza, High Dose Seasonal PF 11/08/2013, 10/10/2016, 09/24/2018, 10/06/2019   Influenza,inj,quad, With Preservative 11/07/2014   Influenza-Unspecified 10/12/2016, 10/29/2017, 10/06/2019   PFIZER(Purple Top)SARS-COV-2 Vaccination 02/27/2019, 03/21/2019, 11/04/2019, 06/12/2020, 10/30/2020   Pneumococcal Conjugate-13 03/27/2016   Pneumococcal Polysaccharide-23 12/20/2013   Tdap 02/02/2013, 09/02/2020  4. Cervical cancer screening- Follows with Laurin Coder intermittently for GYN exams- past formal age based recommendations 5. Breast cancer screening-  breast exam with GYN and mammogram 10/18/20 with 3d 6. Colon cancer screening - 08/14/2020 due to GI issues and good for 10 years or even being released. Dr. Cristina Gong- now retired 76. Skin cancer screening- follows with dermatology as needed. advised regular sunscreen use. Denies worrisome, changing, or new skin lesions.  8. Birth control/STD check- only active with husband and postmenopausal 9. Osteoporosis screening at 61- osteoporosis noted- prior therapy was delayed with knee issues. She states at this point would not take medication even if worsened  - primary goal will be fall prevention, keeping vitamin D adequate, weight bearing exercise- walking regularly 10. Smoking associated screening - current smoker- down to cigarettes a day. Does not want to quit- enjoys smoking. Still in ct lugn cancer screening program and I will check UA. Encouraged cessation- she declines.    Status of chronic or acute concerns   # Chest pain S:in the past has had some central chest pain now having intermittent left lateral chest wall pain that is sharp- lasts about 4 minutes. Not associated with movement mor exercise. Usually actually happens while seated. No increased  cough with this . Some increased SOB with COPD. No edema.  -January chest CT does show aortic atherosclerosis but no coronary calcifications were noted.  - Dr. Rayann Heman no longer at [facility] for regular visits -brother died at this age so anxiety provoking for her A/P: nonexertional, not central chest pain, sharp, not relieved by rest- atypical chest pain but with family history and aortic atherosclerosis she would like to chat with cardiology- referral was placed. Declined EKG today   # COPD/asthma/allergies S: Maintenance medications: has tried twice without help.None from pulmonary perspective.  For allergies-no rx regularly- as needed claritin   Patient has had to use albuterol 2 x per week.  No oxygen at home  A/P: copd/asthma/allergies overall stable. Prn claritin and albuterol- reasonable use.   # Atrial fibrillation S: Rate controlled with no medication.  Has Cardizem on hand in case goes into atrial fibrillation Anticoagulated with Eliquis 5 mg twice daily Patient is  followed by cardiology: Dr. Rayann Heman who has now transitioned out of clinical practice so getting reeastablished  A/P: appropriately rate controlled without meds (and has seemed to remain out of a fib) and anticoagulated. Does need to get plugged back in with new cardiologist- will refer today for chest pain as well as ongoing a fib treatment    % Depression/anxiety -follows with psychiatric nurse practitioner Pauline Good S: Medication: Sertraline 100 mg, bupropion '300Mg'$ , Xanax 0.'5Mg'$     08/01/2021    7:54 AM 05/03/2021    1:06 PM 03/18/2021    1:51 PM  Depression screen PHQ 2/9  Decreased Interest 0 0 0  Down, Depressed, Hopeless 0 0 0  PHQ  - 2 Score 0 0 0  Altered sleeping 0  0  Tired, decreased energy 0  0  Change in appetite 0  0  Feeling bad or failure about yourself  0  0  Trouble concentrating 0  0  Moving slowly or fidgety/restless 0  0  Suicidal thoughts 0  0  PHQ-9 Score 0  0  Difficult doing work/chores Not difficult at all    A/P: doing well- full remission- continue current meds and psych follow up  #Vitamin D deficiency S: Medication:  she is currently on 1000 - was on 2000 after low last year for a while- tolerating ok at this poitn -Patient reports aching in her toes on vitamin D in the past Last vitamin D Lab Results  Component Value Date   VD25OH 25.43 (L) 07/31/2020  A/P: update vitamin D with labs- for now continue current meds   #hyperlipidemia/aortic atherosclerosis S: Brother with significant myalgias on statins and patient is concerned about trialing-she is more interested in fish oil or Vascepa Medication:she is starting to be slightly more open to considering statin  -has had headaches in past but was not on emgality then Lab Results  Component Value Date   CHOL 277 (H) 07/31/2020   HDL 63.50 07/31/2020   LDLCALC 184 (H) 07/31/2020   TRIG 144.0 07/31/2020   CHOLHDL 4 07/31/2020   A/P: poor control- update lipids- could consider as weve discussed even once a week statin to ease in if she is agreeable after #s come back. Has improved diet  - crestor 5 mg once a week potentially  % Headaches-follows closely with Dr. Jannifer Franklin. on emgality.  fiorcet or sumatriptan prn. botox injections. Phenergan for nausea portion   #Urgency and diarrhea or constipation at times.  No blood in stool or melena.  Continued issues since 2014.  Is  up-to-date on colonoscopy in 2022  #liver nodularity noted on ct lung cancer screening- prior US 10/26/19 normal- plan to reevaluate with yearly scan and may do repet [place] in future - consider elasto graphy if persistent   Recommended follow up: Return in about 1 year  (around 08/02/2022) for physical or sooner if needed.Schedule b4 you leave. Future Appointments  Date Time Provider [place]  10/02/2021 11:15 AM Suzzanne Cloud, NP [facility] None  12/12/2021  1:00 PM Melvenia Beam, MD [facility] None  05/16/2022  1:00 PM [facility] HEALTH COACH [facility]   Lab/Order associations: fasting   ICD-10-CM   1. Preventative health care  Z00.00     2. Paroxysmal atrial fibrillation [facility]) s/p ablation november 2018, 2011 previously  I48.0     3. Chronic obstructive pulmonary disease, unspecified COPD type ([place])  J44.9     4. Tobacco abuse  Z72.0     5. Depression, major, single episode, complete remission ([place])  F32.5     6. High cholesterol  E78.00     7. Low vitamin D level  R79.89     8. Atypical chest pain  R07.89       No orders of the defined types were placed in this encounter.   Return precautions advised.  Garret Reddish, MD

## 2021-08-01 NOTE — Patient Instructions (Addendum)
Health Maintenance Due  Topic Date Due   COVID-19 Vaccine (6 - Booster for [employer] series) -consider before travel 12/25/2020   We will call you within two weeks about your referral to cardiology. If you do not hear within 2 weeks, give [place] a call.    Please stop by lab before you go If you have mychart- we will send your results within 3 business days of [place] receiving them.  If you do not have mychart- we will call you about results within 5 business days of [place] receiving them.  *please also note that you will see labs on mychart as soon as they post. I will later go in and write notes on them- will say "notes from Dr. Yong Channel"   Recommended follow up: Return in about 1 year (around 08/02/2022) for physical or sooner if needed.Schedule b4 you leave.

## 2021-08-02 ENCOUNTER — Other Ambulatory Visit (HOSPITAL_COMMUNITY): Payer: Self-pay

## 2021-08-02 ENCOUNTER — Other Ambulatory Visit: Payer: Self-pay

## 2021-08-02 ENCOUNTER — Encounter: Payer: Self-pay | Admitting: Family Medicine

## 2021-08-02 MED ORDER — ROSUVASTATIN CALCIUM 5 MG PO TABS
5.0000 mg | ORAL_TABLET | Freq: Every day | ORAL | 3 refills | Status: DC
  Filled 2021-08-02: qty 13, 13d supply, fill #0

## 2021-08-02 MED ORDER — ROSUVASTATIN CALCIUM 5 MG PO TABS
5.0000 mg | ORAL_TABLET | ORAL | 3 refills | Status: DC
  Filled 2021-08-02: qty 12, 84d supply, fill #0
  Filled 2021-10-31: qty 12, 84d supply, fill #1
  Filled 2022-02-04: qty 12, 84d supply, fill #2
  Filled 2022-05-19: qty 12, 84d supply, fill #3

## 2021-08-20 ENCOUNTER — Other Ambulatory Visit (HOSPITAL_COMMUNITY): Payer: Self-pay

## 2021-08-20 ENCOUNTER — Other Ambulatory Visit: Payer: Self-pay | Admitting: Neurology

## 2021-08-20 ENCOUNTER — Encounter: Payer: Self-pay | Admitting: Neurology

## 2021-08-20 ENCOUNTER — Other Ambulatory Visit: Payer: Self-pay | Admitting: *Deleted

## 2021-08-20 MED ORDER — BUTALBITAL-APAP-CAFFEINE 50-325-40 MG PO TABS
ORAL_TABLET | ORAL | 0 refills | Status: DC
  Filled 2021-08-20: qty 30, 30d supply, fill #0

## 2021-08-22 ENCOUNTER — Other Ambulatory Visit (HOSPITAL_COMMUNITY): Payer: Self-pay

## 2021-08-22 MED ORDER — SUMATRIPTAN SUCCINATE 100 MG PO TABS
ORAL_TABLET | ORAL | 0 refills | Status: DC
  Filled 2021-08-22: qty 27, 90d supply, fill #0

## 2021-08-25 NOTE — Progress Notes (Unsigned)
Cardiology Office Note Date:  08/27/2021  Patient ID:  Suzanne, [name] December 30, 1950, MRN [id number] PCP:  Marin Olp, MD  Electrophysiologist: Dr. Rayann Heman    Chief Complaint:  CP, SOB  History of Present Illness: Suzanne [name] is a 71 y.o. female with history of COPD, HLD, GERD, AFib/Flutter, migraine HAs, OSA  She comes in today to be seen for dr. Rayann Heman, last seen by him Oct 2022, she was doing well, rare episodes of SOB that she thinks attributed to AFib.  Loop was RRT and removed, pt did not want a new loop placed, advised wearable cardiac tech if able.  TODAY She reports some symptoms tdaay that she would like to review and perhaps w/u She is a retired Health visitor, reports that her brother collapsed/died at 68 after getting home from his usual jog. Her father had CAD, died in his 60's She reports perhaps a month or 2 having a random, fairly infrequent CP, located to left far lateral chest towards the lateral side/area of her breast and another same type of pain to just medial of her L breast.  These do not happen together or radiate.  They last 4-5 min, like a "spasm". No associated symptoms No trigger, no changed by position or exertional, not triggered by exertion.  She does have an unassociated SOB that she is unsuer if it is AFib or COPD.  She suspects perhaps AFib. This happens a few times a week, not markedly, buut noticeable, again, not particularly exertional.  No linked to her CP.  This is longer more chronic symptom.  No bleeding, signs of bleeding  No near syncope or syncope.  AFib/flutter Hx: Diagnosed 2010 Flecainide/amiodarone failed to maintain seems used very remotely PVI ablation 2011 PVI/CTI ablation 12/16/2016  Past Medical History:  Diagnosis Date   AICD (automatic cardioverter/defibrillator) present    loop recorder   Anxiety    Anxiety state 12/14/2018   Asthma    Atrial fibrillation (HCC)    s/p ablations   Atrial flutter (HCC)     COPD (chronic obstructive pulmonary disease) ([place])    Degenerative arthritis    "maybe in my left knee" (12/16/2016)   Depression    Dysrhythmia 2010   A-fib   Frozen shoulder    hx bilaterally; "had to go thru PT; no problem w/them since I learned exercises" (12/16/2016)   GERD (gastroesophageal reflux disease)    Glaucoma    H/O: rheumatic fever 1957   High cholesterol    Migraine    "getting botox now; now only 2-3/month" (12/16/2016)   PONV (postoperative nausea and vomiting)    with Fentanyl,  Dilaudid &  Propofol (12/16/2016)    Past Surgical History:  Procedure Laterality Date   ABDOMINAL HYSTERECTOMY  1990   right ovary removed   [place] N/A 12/16/2016   Procedure: ATRIAL FIBRILLATION ABLATION;  Surgeon: Thompson Grayer, MD;  Location: [facility];  Service: Cardiovascular;  Laterality: N/A;   CATARACT EXTRACTION, BILATERAL     CTI ablation  04/2009   ESOPHAGOGASTRODUODENOSCOPY (EGD) WITH PROPOFOL N/A 01/06/2013   Procedure: ESOPHAGOGASTRODUODENOSCOPY (EGD) WITH PROPOFOL;  Surgeon: Cleotis Nipper, MD;  Location: [facility];  Service: Endoscopy;  Laterality: N/A;   implantable loop recorder removal  11/05/2020   MDT LINQ removed by Dr Rayann Heman   KNEE ARTHROSCOPY Bilateral 1999-2006   KNEE SURGERY Bilateral 2595,6387   "open; for congenital deformity"   LOOP RECORDER INSERTION  N/A 10/02/2016   Procedure: LOOP RECORDER INSERTION;  Surgeon: Thompson Grayer, MD;  Location: [facility];  Service: Cardiovascular;  Laterality: N/A;   PERIPHERAL VASCULAR INTERVENTION  2/11  & 6/11   RHINOPLASTY  1965   TONSILLECTOMY  as child   TOOTH EXTRACTION  2009   2 teeth extractted   TOTAL KNEE ARTHROPLASTY Left 12/27/2018   Procedure: TOTAL KNEE ARTHROPLASTY;  Surgeon: Elsie Saas, MD;  Location: [facility];  Service: Orthopedics;  Laterality: Left;    Current Outpatient Medications  Medication Sig Dispense Refill    acetaminophen (TYLENOL) 325 MG tablet Take 325 mg 2 (two) times daily as needed by mouth (pain).      albuterol (VENTOLIN HFA) 108 (90 Base) MCG/ACT inhaler INHALE 2 PUFFS INTO THE LUNGS EVERY 6 HOURS AS NEEDED FOR WHEEZING OR SHORTNESS OF BREATH. 18 g 2   ALPRAZolam (XANAX) 0.5 MG tablet TAKE 1 TABLET BY MOUTH TWICE A DAY AS NEEDED FOR ANXIETY 60 tablet 2   BOTOX 200 units SOLR INJECT 200 UNITS  INTRAMUSCULARLY EVERY 3  MONTHS (GIVEN AT MD OFFICE, DISCARD UNUSED PORTION  AFTER FIRST USE) 1 each 3   buPROPion (WELLBUTRIN XL) 300 MG 24 hr tablet TAKE 1 TABLET BY MOUTH EVERY MORNING 90 tablet 2   butalbital-acetaminophen-caffeine (FIORICET) 50-325-40 MG tablet TAKE 1 TABLET BY MOUTH EVERY 6 HOURS AS NEEDED FOR HEADACHE. 30 tablet 0   dorzolamide-timolol (COSOPT) 22.3-6.8 MG/ML ophthalmic solution PLACE 1 DROP IN BOTH EYES TWO TIMES DAILY 20 mL 4   Galcanezumab-gnlm (EMGALITY) 120 MG/ML SOAJ Inject 120 mg as directed every 30 days. 3 mL 3   Hypromellose (ARTIFICIAL TEARS OP) Place 1 drop into both eyes daily as needed (dry eyes).      Probiotic Product (ALIGN PO) Take 4 mg by mouth daily.     promethazine (PHENERGAN) 25 MG tablet Take 1 tablet (25 mg total) by mouth every 6 (six) hours as needed for nausea or vomiting. 20 tablet 1   rosuvastatin (CRESTOR) 5 MG tablet Take 1 tablet (5 mg total) by mouth once a week. 13 tablet 3   sertraline (ZOLOFT) 100 MG tablet TAKE 1 TABLET BY MOUTH ONCE A DAY. 90 tablet 2   Simethicone (GAS-X PO) Take 2 tablets daily as needed by mouth (gas).     sodium chloride (OCEAN) 0.65 % SOLN nasal spray Place 1 spray as needed into both nostrils for congestion.     SUMAtriptan (IMITREX) 100 MG tablet TAKE 1 TABLET BY MOUTH AT THE ONSET OF A HEADACHE DAILY AS NEEDED 27 tablet 0   apixaban (ELIQUIS) 5 MG TABS tablet Take 1 tablet by mouth 2 times daily. 180 tablet 0   Current Facility-Administered Medications  Medication Dose Route Frequency Provider Last Rate Last Admin    botulinum toxin Type A (BOTOX) injection 200 Units  200 Units Intramuscular Once Kathrynn Ducking, MD        Allergies:   Dilaudid [hydromorphone hcl], Fentanyl, Ciprofloxacin, Erythromycin, and Percocet [oxycodone-acetaminophen]   Social History:  The patient  reports that she has been smoking cigarettes. She has a 12.50 pack-year smoking history. She has never used smokeless tobacco. She reports current alcohol use. She reports that she does not use drugs.   Family History:  The patient's family history includes Cancer in her mother; Dementia in her father; Heart disease in her father; Hyperlipidemia in her brother; Migraines in her mother; Other in her brother.  ROS:  Please see the history of  present illness.    All other systems are reviewed and otherwise negative.   PHYSICAL EXAM:  VS:  BP 130/80   Pulse 60   Ht '5\' 7"'$  (1.702 m)   Wt 139 lb 9.6 oz (63.3 kg)   SpO2 95%   BMI 21.86 kg/m  BMI: Body mass index is 21.86 kg/m. Well nourished, well developed, in no acute distress HEENT: normocephalic, atraumatic Neck: no JVD, carotid bruits or masses Cardiac:  RRR; no significant murmurs, no rubs, or gallops Lungs:  CTA b/l, no wheezing, rhonchi or rales Abd: soft, nontender MS: no deformity or atrophy Ext: no edema Skin: warm and dry, no rash Neuro:  No gross deficits appreciated Psych: euthymic mood, full affect   EKG:  Done today and reviewed by myself shows  SR 60bpm, unchanged from prior, some baseline artofact   12/16/2016: EPS/Ablation CONCLUSIONS: 1. Sinus rhythm upon presentation.   2. Intracardiac echo reveals a moderately enlared sized left atrium with four separate pulmonary veins without evidence of pulmonary vein stenosis. 3. Return of electrical activity within the left superior pulmonary vein.  The left inferior, right superior, and right inferior pulmonary veins were quiescent from a prior ablation procedure 4. As the prior ablation was an antral ablation  procedure, I elected to perform WACA around all four pulmonary veins today.  Entrance and exit block was confirmed in all 4 PVs with adenosine and isuprel administered    5. Additional Cavo-tricuspid isthmus ablation was performed with complete bidirectional isthmus block achieved.  6. No inducible arrhythmias following ablation both on and off of Isuprel 7. No early apparent complications.   12/05/2013: TTE Study Conclusions  - Left ventricle: The cavity size was normal. Wall thickness was    normal. Systolic function was normal. The estimated ejection    fraction was in the range of 55% to 60%. Wall motion was normal;    there were no regional wall motion abnormalities. Doppler    parameters are consistent with abnormal left ventricular    relaxation (grade 1 diastolic dysfunction).     Recent Labs: 08/01/2021: ALT 9; BUN 13; Creatinine, Ser 0.78; Hemoglobin 13.5; Platelets 165.0; Potassium 4.2; Sodium 137  08/01/2021: Cholesterol 264; HDL 71.90; LDL Cholesterol 171; Total CHOL/HDL Ratio 4; Triglycerides 107.0; VLDL 21.4   CrCl cannot be calculated (Patient's most recent lab result is older than the maximum 21 days allowed.).   Wt Readings from Last 3 Encounters:  08/27/21 139 lb 9.6 oz (63.3 kg)  08/01/21 140 lb 12.8 oz (63.9 kg)  07/10/21 141 lb (64 kg)     Other studies reviewed: Additional studies/records reviewed today include: summarized above  ASSESSMENT AND PLAN:  Paroxysmal AFib CHA2DS3Vasc is 2 (age, gender) maintained on Eliquis, appropriately dosed ? Unclear burden as discussed Would like to settle if her occasional SOB is AFib or not Plan for a 1 week monitor  2. CP Largely atypical sounding + family hx Will get stress Myoview and an echo   Disposition: F/u with her in a few months, plan to transition to Dr. Curt Bears.  Current medicines are reviewed at length with the patient today.  The patient did not have any concerns regarding  medicines.  Venetia Night, PA-C 08/27/2021 12:38 PM     [address] [place] [address] [phone] (office)  [phone] (fax)

## 2021-08-27 ENCOUNTER — Encounter: Payer: Self-pay | Admitting: Physician Assistant

## 2021-08-27 ENCOUNTER — Ambulatory Visit: Payer: Medicare PPO | Admitting: Physician Assistant

## 2021-08-27 ENCOUNTER — Other Ambulatory Visit (HOSPITAL_COMMUNITY): Payer: Self-pay

## 2021-08-27 ENCOUNTER — Ambulatory Visit (INDEPENDENT_AMBULATORY_CARE_PROVIDER_SITE_OTHER): Payer: Medicare PPO

## 2021-08-27 VITALS — BP 130/80 | HR 60 | Ht 67.0 in | Wt 139.6 lb

## 2021-08-27 DIAGNOSIS — I480 Paroxysmal atrial fibrillation: Secondary | ICD-10-CM

## 2021-08-27 DIAGNOSIS — I4892 Unspecified atrial flutter: Secondary | ICD-10-CM | POA: Insufficient documentation

## 2021-08-27 DIAGNOSIS — R079 Chest pain, unspecified: Secondary | ICD-10-CM

## 2021-08-27 MED ORDER — APIXABAN 5 MG PO TABS
5.0000 mg | ORAL_TABLET | Freq: Two times a day (BID) | ORAL | 0 refills | Status: DC
  Filled 2021-08-27 – 2021-09-18 (×2): qty 180, 90d supply, fill #0

## 2021-08-27 NOTE — Progress Notes (Unsigned)
Enrolled patient for a 7 day Zio XT monitor to be mailed to patients home  Dr Camnitz to read 

## 2021-08-27 NOTE — Patient Instructions (Addendum)
Medication Instructions:  Your physician recommends that you continue on your current medications as directed. Please refer to the Current Medication list given to you today.  Your Eliquis prescription has been refilled and sent to your pharmacy.  Labwork: None ordered.  Testing/Procedures:   Schedule an Echocardiogam   2.   Please schedule Myocardial Perfusion Imaging / Lexi Scan  3.  Home Monitor XT ordered for at home study.   Follow-Up:  Your physician wants you to follow-up in: 3 MONTHS with Suzanne Standard, PA-C  2.   SCHEDULE NEW PATIENT follow up appointment with Dr. Elliot Cousin in 6 MONTHS     Any Other Special Instructions Will Be Listed Below (If Applicable).  If you need a refill on your cardiac medications before your next appointment, please call your pharmacy.   Important Information About Sugar      ZIO XT- Long Term Monitor Instructions  Your physician has requested you wear a ZIO patch monitor for 14 days.  This is a single patch monitor. [employer] supplies one patch monitor per enrollment. Additional stickers are not available. Please do not apply patch if you will be having a Nuclear Stress Test,  Echocardiogram, Cardiac CT, MRI, or Chest Xray during the period you would be wearing the  monitor. The patch cannot be worn during these tests. You cannot remove and re-apply the  ZIO XT patch monitor.  Your ZIO patch monitor will be mailed 3 day USPS to your address on file. It may take 3-5 days  to receive your monitor after you have been enrolled.  Once you have received your monitor, please review the enclosed instructions. Your monitor  has already been registered assigning a specific monitor serial # to you.  Billing and Patient Assistance Program Information  We have supplied [employer] with any of your insurance information on file for billing purposes. [employer] offers a sliding scale Patient Assistance Program for patients that do not have  insurance,  or whose insurance does not completely cover the cost of the ZIO monitor.  You must apply for the Patient Assistance Program to qualify for this discounted rate.  To apply, please call [employer] at [phone], select option 4, select option 2, ask to apply for  Patient Assistance Program. Theodore Demark will ask your household income, and how many people  are in your household. They will quote your out-of-pocket cost based on that information.  [employer] will also be able to set up a 79-month interest-free payment plan if needed.  Applying the monitor   Shave hair from upper left chest.  Hold abrader disc by orange tab. Rub abrader in 40 strokes over the upper left chest as  indicated in your monitor instructions.  Clean area with 4 enclosed alcohol pads. Let dry.  Apply patch as indicated in monitor instructions. Patch will be placed under collarbone on left  side of chest with arrow pointing upward.  Rub patch adhesive wings for 2 minutes. Remove white label marked "1". Remove the white  label marked "2". Rub patch adhesive wings for 2 additional minutes.  While looking in a mirror, press and release button in center of patch. A small green light will  flash 3-4 times. This will be your only indicator that the monitor has been turned on.  Do not shower for the first 24 hours. You may shower after the first 24 hours.  Press the button if you feel a symptom. You will hear a small click. Record Date, Time and  Symptom in the Patient Logbook.  When you are ready to remove the patch, follow instructions on the last 2 pages of Patient  Logbook. Stick patch monitor onto the last page of Patient Logbook.  Place Patient Logbook in the blue and white box. Use locking tab on box and tape box closed  securely. The blue and white box has prepaid postage on it. Please place it in the mailbox as  soon as possible. Your physician should have your test results approximately 7 days after the  monitor has been  mailed back to [facility]  Call [place] at [phone] if you have questions regarding  your ZIO XT patch monitor. Call them immediately if you see an orange light blinking on your  monitor.  If your monitor falls off in less than 4 days, contact our Monitor department at [phone].  If your monitor becomes loose or falls off after 4 days call [employer] at [phone] for  suggestions on securing your monitor

## 2021-08-28 ENCOUNTER — Encounter: Payer: Self-pay | Admitting: Family Medicine

## 2021-08-30 DIAGNOSIS — R079 Chest pain, unspecified: Secondary | ICD-10-CM

## 2021-08-30 DIAGNOSIS — I480 Paroxysmal atrial fibrillation: Secondary | ICD-10-CM | POA: Diagnosis not present

## 2021-08-30 DIAGNOSIS — I4892 Unspecified atrial flutter: Secondary | ICD-10-CM

## 2021-09-06 ENCOUNTER — Other Ambulatory Visit: Payer: Self-pay | Admitting: Physician Assistant

## 2021-09-06 ENCOUNTER — Telehealth (HOSPITAL_COMMUNITY): Payer: Self-pay | Admitting: Radiology

## 2021-09-06 DIAGNOSIS — R079 Chest pain, unspecified: Secondary | ICD-10-CM

## 2021-09-06 NOTE — Telephone Encounter (Signed)
Left message on voicemail per DPR in refer///ence to [place] appointment scheduled :on  8/10 at 7:15 with detailed instructions given per Myocardial Perfusion Study Information Sheet for the test. LM to arrive 15 minutes early, and that it is imperative to arrive on time for appointment to keep from having the test rescheduled. If you need to cancel or reschedule your appointment, please call the office within 24 hours of your appointment. Failure to do so may result in a cancellation of your appointment, and a $50 no show fee. Phone number given for call back for any questions.

## 2021-09-09 ENCOUNTER — Other Ambulatory Visit: Payer: Self-pay | Admitting: Obstetrics and Gynecology

## 2021-09-09 DIAGNOSIS — Z1231 Encounter for screening mammogram for malignant neoplasm of breast: Secondary | ICD-10-CM

## 2021-09-11 DIAGNOSIS — Z961 Presence of intraocular lens: Secondary | ICD-10-CM | POA: Diagnosis not present

## 2021-09-11 DIAGNOSIS — H26493 Other secondary cataract, bilateral: Secondary | ICD-10-CM | POA: Diagnosis not present

## 2021-09-11 DIAGNOSIS — H401231 Low-tension glaucoma, bilateral, mild stage: Secondary | ICD-10-CM | POA: Diagnosis not present

## 2021-09-12 ENCOUNTER — Ambulatory Visit (HOSPITAL_COMMUNITY): Payer: Medicare PPO | Attending: Internal Medicine

## 2021-09-12 ENCOUNTER — Ambulatory Visit (HOSPITAL_BASED_OUTPATIENT_CLINIC_OR_DEPARTMENT_OTHER): Payer: Medicare PPO

## 2021-09-12 DIAGNOSIS — I480 Paroxysmal atrial fibrillation: Secondary | ICD-10-CM | POA: Insufficient documentation

## 2021-09-12 DIAGNOSIS — R079 Chest pain, unspecified: Secondary | ICD-10-CM

## 2021-09-12 DIAGNOSIS — I4892 Unspecified atrial flutter: Secondary | ICD-10-CM | POA: Diagnosis not present

## 2021-09-12 LAB — MYOCARDIAL PERFUSION IMAGING
LV dias vol: 70 mL (ref 46–106)
LV sys vol: 26 mL
Nuc Stress EF: 63 %
Peak HR: 82 {beats}/min
Rest HR: 61 {beats}/min
Rest Nuclear Isotope Dose: 9.7 mCi
SDS: 0
SRS: 0
SSS: 0
Stress Nuclear Isotope Dose: 32.5 mCi
TID: 0.93

## 2021-09-12 LAB — ECHOCARDIOGRAM COMPLETE
Area-P 1/2: 2.28 cm2
Height: 67 in
S' Lateral: 3.2 cm
Weight: 2224 oz

## 2021-09-12 MED ORDER — REGADENOSON 0.4 MG/5ML IV SOLN
0.4000 mg | Freq: Once | INTRAVENOUS | Status: AC
  Administered 2021-09-12: 0.4 mg via INTRAVENOUS

## 2021-09-12 MED ORDER — TECHNETIUM TC 99M TETROFOSMIN IV KIT
9.7000 | PACK | Freq: Once | INTRAVENOUS | Status: AC | PRN
  Administered 2021-09-12: 9.7 via INTRAVENOUS

## 2021-09-12 MED ORDER — TECHNETIUM TC 99M TETROFOSMIN IV KIT
32.5000 | PACK | Freq: Once | INTRAVENOUS | Status: AC | PRN
  Administered 2021-09-12: 32.5 via INTRAVENOUS

## 2021-09-17 DIAGNOSIS — F411 Generalized anxiety disorder: Secondary | ICD-10-CM | POA: Diagnosis not present

## 2021-09-17 DIAGNOSIS — F331 Major depressive disorder, recurrent, moderate: Secondary | ICD-10-CM | POA: Diagnosis not present

## 2021-09-18 ENCOUNTER — Other Ambulatory Visit (HOSPITAL_COMMUNITY): Payer: Self-pay

## 2021-09-18 MED ORDER — ALPRAZOLAM 0.5 MG PO TABS
ORAL_TABLET | ORAL | 2 refills | Status: DC
Start: 2021-09-18 — End: 2022-06-23
  Filled 2021-09-18: qty 60, 30d supply, fill #0
  Filled 2021-12-13: qty 60, 30d supply, fill #1

## 2021-09-18 MED ORDER — SERTRALINE HCL 100 MG PO TABS
ORAL_TABLET | Freq: Every day | ORAL | 1 refills | Status: DC
  Filled 2021-09-18: qty 90, 90d supply, fill #0
  Filled 2021-12-13: qty 90, 90d supply, fill #1

## 2021-09-18 MED ORDER — BUPROPION HCL ER (XL) 300 MG PO TB24
ORAL_TABLET | Freq: Every day | ORAL | 0 refills | Status: DC
  Filled 2021-09-18: qty 30, 30d supply, fill #0

## 2021-09-19 ENCOUNTER — Other Ambulatory Visit (HOSPITAL_COMMUNITY): Payer: Self-pay

## 2021-09-20 ENCOUNTER — Other Ambulatory Visit (HOSPITAL_COMMUNITY): Payer: Self-pay

## 2021-09-23 ENCOUNTER — Other Ambulatory Visit (HOSPITAL_COMMUNITY): Payer: Self-pay

## 2021-10-02 ENCOUNTER — Ambulatory Visit: Payer: Medicare PPO | Admitting: Neurology

## 2021-10-02 VITALS — BP 147/69 | HR 68 | Ht 67.0 in | Wt 141.0 lb

## 2021-10-02 DIAGNOSIS — G43719 Chronic migraine without aura, intractable, without status migrainosus: Secondary | ICD-10-CM

## 2021-10-02 MED ORDER — ONABOTULINUMTOXINA 200 UNITS IJ SOLR
200.0000 [IU] | Freq: Once | INTRAMUSCULAR | Status: AC
  Administered 2021-10-02: 155 [IU] via INTRAMUSCULAR

## 2021-10-02 NOTE — Progress Notes (Signed)
     BOTOX PROCEDURE NOTE FOR MIGRAINE HEADACHE   HISTORY: Suzanne [name] comes in for Botox injection.  She has done excellent with Botox in combination with Emgality.  She has had much less headache, are less intense.  At least 50% improvement.  I refilled Fioricet 08/20/21 # 30 tablets, reports has 25 tablets left.  She was taking 30 tablets at month.  She is leaving today and for the [place].   Description of procedure:  The patient was placed in a sitting position. The standard protocol was used for Botox as follows, with 5 units of Botox injected at each site:   -Procerus muscle, midline injection  -Corrugator muscle, bilateral injection  -Frontalis muscle, bilateral injection, with 2 sites each side, medial injection was performed in the upper one third of the frontalis muscle, in the region vertical from the medial inferior edge of the superior orbital rim. The lateral injection was again in the upper one third of the forehead vertically above the lateral limbus of the cornea, 1.5 cm lateral to the medial injection site.  -Temporalis muscle injection, 4 sites, bilaterally. The first injection was 3 cm above the tragus of the ear, second injection site was 1.5 cm to 3 cm up from the first injection site in line with the tragus of the ear. The third injection site was 1.5-3 cm forward between the first 2 injection sites. The fourth injection site was 1.5 cm posterior to the second injection site.  -Occipitalis muscle injection, 3 sites, bilaterally. The first injection was done one half way between the occipital protuberance and the tip of the mastoid process behind the ear. The second injection site was done lateral and superior to the first, 1 fingerbreadth from the first injection. The third injection site was 1 fingerbreadth superiorly and medially from the first injection site.  -Cervical paraspinal muscle injection, 2 sites, bilateral, the first injection site was 1 cm from the midline of the  cervical spine, 3 cm inferior to the lower border of the occipital protuberance. The second injection site was 1.5 cm superiorly and laterally to the first injection site.  -Trapezius muscle injection was performed at 3 sites, bilaterally. The first injection site was in the upper trapezius muscle halfway between the inflection point of the neck, and the acromion. The second injection site was one half way between the acromion and the first injection site. The third injection was done between the first injection site and the inflection point of the neck.   A 200 unit bottle of Botox was used, 155 units were injected, the rest of the Botox was wasted. The patient tolerated the procedure well, there were no complications of the above procedure.  [phone] [id number] Exp-03/2024 B/B

## 2021-10-23 ENCOUNTER — Other Ambulatory Visit (HOSPITAL_COMMUNITY): Payer: Self-pay

## 2021-10-24 ENCOUNTER — Ambulatory Visit
Admission: RE | Admit: 2021-10-24 | Discharge: 2021-10-24 | Disposition: A | Discharge status: Home / Self Care | Payer: Medicare PPO | Source: Ambulatory Visit | Attending: Obstetrics and Gynecology | Admitting: Obstetrics and Gynecology

## 2021-10-24 DIAGNOSIS — Z1231 Encounter for screening mammogram for malignant neoplasm of breast: Secondary | ICD-10-CM

## 2021-10-28 ENCOUNTER — Encounter: Payer: Self-pay | Admitting: *Deleted

## 2021-10-31 ENCOUNTER — Other Ambulatory Visit (HOSPITAL_COMMUNITY): Payer: Self-pay

## 2021-10-31 ENCOUNTER — Encounter: Payer: Self-pay | Admitting: Family Medicine

## 2021-11-05 ENCOUNTER — Other Ambulatory Visit (HOSPITAL_COMMUNITY): Payer: Self-pay

## 2021-11-05 DIAGNOSIS — N898 Other specified noninflammatory disorders of vagina: Secondary | ICD-10-CM | POA: Diagnosis not present

## 2021-11-05 DIAGNOSIS — B3731 Acute candidiasis of vulva and vagina: Secondary | ICD-10-CM | POA: Diagnosis not present

## 2021-11-05 MED ORDER — NYSTATIN-TRIAMCINOLONE 100000-0.1 UNIT/GM-% EX OINT
TOPICAL_OINTMENT | CUTANEOUS | 0 refills | Status: DC
  Filled 2021-11-05: qty 30, 10d supply, fill #0

## 2021-11-05 MED ORDER — FLUCONAZOLE 150 MG PO TABS
ORAL_TABLET | ORAL | 0 refills | Status: DC
  Filled 2021-11-05: qty 2, 3d supply, fill #0

## 2021-11-06 ENCOUNTER — Other Ambulatory Visit (HOSPITAL_COMMUNITY): Payer: Self-pay

## 2021-11-06 MED ORDER — FLUCONAZOLE 150 MG PO TABS: ORAL_TABLET | ORAL | 0 refills | Status: DC

## 2021-11-07 ENCOUNTER — Other Ambulatory Visit (HOSPITAL_COMMUNITY): Payer: Self-pay

## 2021-11-08 ENCOUNTER — Encounter: Payer: Self-pay | Admitting: Family Medicine

## 2021-12-01 NOTE — Progress Notes (Unsigned)
Cardiology Office Note Date:  12/01/2021  Patient ID:  Suzanne [name], Suzanne [name] Apr 19, 1950, MRN [id number] PCP:  Marin Olp, MD  Electrophysiologist: Dr. Rayann Heman    Chief Complaint:  planned f/u  History of Present Illness: Suzanne [name] is a 71 y.o. female with history of COPD, HLD, GERD, AFib/Flutter, migraine HAs, OSA  She comes in today to be seen for dr. Rayann Heman, last seen by him Oct 2022, she was doing well, rare episodes of SOB that she thinks attributed to AFib.  Loop was RRT and removed, pt did not want a new loop placed, advised wearable cardiac tech if able.  I saw her 08/27/21 She reports some symptoms tdaay that she would like to review and perhaps w/u She is a retired Health visitor, reports that her brother collapsed/died at 68 after getting home from his usual jog. Her father had CAD, died in his 2's She reports perhaps a month or 2 having a random, fairly infrequent CP, located to left far lateral chest towards the lateral side/area of her breast and another same type of pain to just medial of her L breast.  These do not happen together or radiate.  They last 4-5 min, like a "spasm". No associated symptoms No trigger, no changed by position or exertional, not triggered by exertion.  She does have an unassociated SOB that she is unsuer if it is AFib or COPD.  She suspects perhaps AFib. This happens a few times a week, not markedly, but noticeable, again, not particularly exertional.  No linked to her CP.  This is longer more chronic symptom.  No bleeding, signs of bleeding No near syncope or syncope.  Planned for an echo/stress test and monitor, this to see if her SOB is associated with AF/rhythm./rate or not. Planned to transition to Dr. Curt Bears  Echo looked good, did have grade I DD. Stress was normal, low risk, she did develop SOB,nausea during test, with normal BP and HR response. Monitor was good as well, no Afib, no logged symptom episodes   TODAY Since  her last visit she traveled to [place], days typically involved walking an average of 27mles a day, she felt great until she got COVID, had a headache/fatigue with it, but did well with it, She will get a random sharp pain to the left posterior neck/occipital area, feels better with massage, is brief. She has an infrequent unrelated far left lateral chest pain, also sharp, random, not positional or exertional, no associated symptoms, also brief. No SOB No DOE No dizzy spells, near syncope or syncope. No bleeding   AFib/flutter Hx: Diagnosed 2010 Flecainide/amiodarone failed to maintain sinus, seems used very remotely PVI ablation 2011 PVI/CTI ablation 12/16/2016  Past Medical History:  Diagnosis Date   AICD (automatic cardioverter/defibrillator) present    loop recorder   Anxiety    Anxiety state 12/14/2018   Asthma    Atrial fibrillation (HCC)    s/p ablations   Atrial flutter (HCC)    COPD (chronic obstructive pulmonary disease) ([facility]    Degenerative arthritis    "maybe in my left knee" (12/16/2016)   Depression    Dysrhythmia 2010   A-fib   Frozen shoulder    hx bilaterally; "had to go thru PT; no problem w/them since I learned exercises" (12/16/2016)   GERD (gastroesophageal reflux disease)    Glaucoma    H/O: rheumatic fever 1957   High cholesterol    Migraine    "getting botox now; now only  2-3/month" (12/16/2016)   PONV (postoperative nausea and vomiting)    with Fentanyl,  Dilaudid &  Propofol (12/16/2016)    Past Surgical History:  Procedure Laterality Date   ABDOMINAL HYSTERECTOMY  1990   right ovary removed   [place] N/A 12/16/2016   Procedure: ATRIAL FIBRILLATION ABLATION;  Surgeon: Thompson Grayer, MD;  Location: [facility];  Service: Cardiovascular;  Laterality: N/A;   CATARACT EXTRACTION, BILATERAL     CTI ablation  04/2009   ESOPHAGOGASTRODUODENOSCOPY (EGD) WITH PROPOFOL N/A 01/06/2013   Procedure:  ESOPHAGOGASTRODUODENOSCOPY (EGD) WITH PROPOFOL;  Surgeon: Cleotis Nipper, MD;  Location: [facility];  Service: Endoscopy;  Laterality: N/A;   implantable loop recorder removal  11/05/2020   MDT LINQ removed by Dr Rayann Heman   KNEE ARTHROSCOPY Bilateral 1999-2006   KNEE SURGERY Bilateral 9833,8250   "open; for congenital deformity"   LOOP RECORDER INSERTION N/A 10/02/2016   Procedure: LOOP RECORDER INSERTION;  Surgeon: Thompson Grayer, MD;  Location: [facility];  Service: Cardiovascular;  Laterality: N/A;   PERIPHERAL VASCULAR INTERVENTION  2/11  & 6/11   RHINOPLASTY  1965   TONSILLECTOMY  as child   TOOTH EXTRACTION  2009   2 teeth extractted   TOTAL KNEE ARTHROPLASTY Left 12/27/2018   Procedure: TOTAL KNEE ARTHROPLASTY;  Surgeon: Elsie Saas, MD;  Location: [facility];  Service: Orthopedics;  Laterality: Left;    Current Outpatient Medications  Medication Sig Dispense Refill   acetaminophen (TYLENOL) 325 MG tablet Take 325 mg 2 (two) times daily as needed by mouth (pain).      albuterol (VENTOLIN HFA) 108 (90 Base) MCG/ACT inhaler INHALE 2 PUFFS INTO THE LUNGS EVERY 6 HOURS AS NEEDED FOR WHEEZING OR SHORTNESS OF BREATH. 18 g 2   ALPRAZolam (XANAX) 0.5 MG tablet Take 1 tablet by mouth twice daily as needed for anxiety 60 tablet 2   apixaban (ELIQUIS) 5 MG TABS tablet Take 1 tablet by mouth 2 times daily. 180 tablet 0   BOTOX 200 units SOLR INJECT 200 UNITS  INTRAMUSCULARLY EVERY 3  MONTHS (GIVEN AT MD OFFICE, DISCARD UNUSED PORTION  AFTER FIRST USE) 1 each 3   buPROPion (WELLBUTRIN XL) 300 MG 24 hr tablet TAKE 1 TABLET BY MOUTH EVERY MORNING 90 tablet 2   buPROPion (WELLBUTRIN XL) 300 MG 24 hr tablet Take 1 tablet by mouth daily 30 tablet 0   butalbital-acetaminophen-caffeine (FIORICET) 50-325-40 MG tablet TAKE 1 TABLET BY MOUTH EVERY 6 HOURS AS NEEDED FOR HEADACHE. 30 tablet 0   dorzolamide-timolol (COSOPT) 22.3-6.8 MG/ML ophthalmic solution PLACE 1 DROP IN BOTH EYES TWO TIMES DAILY  20 mL 4   fluconazole (DIFLUCAN) 150 MG tablet Take 1 tablet by mouth on days 1, 4, and 7 as needed 3 tablet 0   Galcanezumab-gnlm (EMGALITY) 120 MG/ML SOAJ Inject 120 mg as directed every 30 days. 3 mL 3   Hypromellose (ARTIFICIAL TEARS OP) Place 1 drop into both eyes daily as needed (dry eyes).      nystatin-triamcinolone ointment (MYCOLOG) Apply a small amount to the affected area topically two times daily for 10 days as needed 30 g 0   Probiotic Product (ALIGN PO) Take 4 mg by mouth daily.     promethazine (PHENERGAN) 25 MG tablet Take 1 tablet (25 mg total) by mouth every 6 (six) hours as needed for nausea or vomiting. 20 tablet 1   rosuvastatin (CRESTOR) 5 MG tablet Take 1 tablet (5 mg total)  by mouth once a week. 13 tablet 3   sertraline (ZOLOFT) 100 MG tablet TAKE 1 TABLET BY MOUTH ONCE A DAY. 90 tablet 2   sertraline (ZOLOFT) 100 MG tablet Take 1 tablet by mouth daily 90 tablet 1   Simethicone (GAS-X PO) Take 2 tablets daily as needed by mouth (gas).     sodium chloride (OCEAN) 0.65 % SOLN nasal spray Place 1 spray as needed into both nostrils for congestion.     SUMAtriptan (IMITREX) 100 MG tablet TAKE 1 TABLET BY MOUTH AT THE ONSET OF A HEADACHE DAILY AS NEEDED 27 tablet 0   Current Facility-Administered Medications  Medication Dose Route Frequency Provider Last Rate Last Admin   botulinum toxin Type A (BOTOX) injection 200 Units  200 Units Intramuscular Once Kathrynn Ducking, MD        Allergies:   Dilaudid [hydromorphone hcl], Fentanyl, Ciprofloxacin, Erythromycin, and Percocet [oxycodone-acetaminophen]   Social History:  The patient  reports that she has been smoking cigarettes. She has a 12.50 pack-year smoking history. She has never used smokeless tobacco. She reports current alcohol use. She reports that she does not use drugs.   Family History:  The patient's family history includes Cancer in her mother; Dementia in her father; Heart disease in her father; Hyperlipidemia in  her brother; Migraines in her mother; Other in her brother.  ROS:  Please see the history of present illness.    All other systems are reviewed and otherwise negative.   PHYSICAL EXAM:  VS:  There were no vitals taken for this visit. BMI: There is no height or weight on file to calculate BMI. Well nourished, well developed, in no acute distress HEENT: normocephalic, atraumatic Neck: no JVD, carotid bruits or masses Cardiac:  RRR; no significant murmurs, no rubs, or gallops Lungs:  CTA b/l, no wheezing, rhonchi or rales Abd: soft, nontender MS: no deformity or atrophy Ext: no edema Skin: warm and dry, no rash Neuro:  No gross deficits appreciated Psych: euthymic mood, full affect   EKG:  not done today  Aug 2023: monitor Predominant rhythm was sinus rhythm Less than 1% ventricular and supraventricular ectopy No atrial fibrillation No triggered episodes  09/12/21: stress myoview The patient reported dyspnea and nausea during the stress test. Normal blood pressure and normal heart rate response noted during stress.   Baseline EKG demonstrates NSR with Nonspecific ST abnormality   Slight accentuation of baseline of nonspecific ST abnormality The ECG was not diagnostic due to pharmacologic protocol.   LV perfusion is normal. There is no evidence of ischemia. There is no evidence of infarction.   Left ventricular function is normal. Nuclear stress EF: 63 %. The left ventricular ejection fraction is normal (55-65%). End diastolic cavity size is normal. End systolic cavity size is normal.   Prior study not available for comparison.   The study is normal. The study is low risk.  09/12/21: TTE  1. Left ventricular ejection fraction, by estimation, is 55 to 60%. The  left ventricle has normal function. The left ventricle has no regional  wall motion abnormalities. Left ventricular diastolic parameters are  consistent with Grade I diastolic  dysfunction (impaired relaxation). The average  left ventricular global  longitudinal strain is -24.2 %. The global longitudinal strain is normal.   2. Right ventricular systolic function is normal. The right ventricular  size is normal. There is normal pulmonary artery systolic pressure. The  estimated right ventricular systolic pressure is 03.4 mmHg.   3.  The mitral valve is abnormal. Trivial mitral valve regurgitation.   4. The aortic valve is tricuspid. Aortic valve regurgitation is not  visualized.   5. The inferior vena cava is normal in size with greater than 50%  respiratory variability, suggesting right atrial pressure of 3 mmHg.   Comparison(s): Prior images unable to be directly viewed, comparison made  by report only. No significant change from prior study. 12/05/2013: LVEF  55-60%, grade 1 DD.   12/16/2016: EPS/Ablation CONCLUSIONS: 1. Sinus rhythm upon presentation.   2. Intracardiac echo reveals a moderately enlared sized left atrium with four separate pulmonary veins without evidence of pulmonary vein stenosis. 3. Return of electrical activity within the left superior pulmonary vein.  The left inferior, right superior, and right inferior pulmonary veins were quiescent from a prior ablation procedure 4. As the prior ablation was an antral ablation procedure, I elected to perform WACA around all four pulmonary veins today.  Entrance and exit block was confirmed in all 4 PVs with adenosine and isuprel administered    5. Additional Cavo-tricuspid isthmus ablation was performed with complete bidirectional isthmus block achieved.  6. No inducible arrhythmias following ablation both on and off of Isuprel 7. No early apparent complications.   12/05/2013: TTE Study Conclusions  - Left ventricle: The cavity size was normal. Wall thickness was    normal. Systolic function was normal. The estimated ejection    fraction was in the range of 55% to 60%. Wall motion was normal;    there were no regional wall motion abnormalities.  Doppler    parameters are consistent with abnormal left ventricular    relaxation (grade 1 diastolic dysfunction).     Recent Labs: 08/01/2021: ALT 9; BUN 13; Creatinine, Ser 0.78; Hemoglobin 13.5; Platelets 165.0; Potassium 4.2; Sodium 137  08/01/2021: Cholesterol 264; HDL 71.90; LDL Cholesterol 171; Total CHOL/HDL Ratio 4; Triglycerides 107.0; VLDL 21.4   CrCl cannot be calculated (Patient's most recent lab result is older than the maximum 21 days allowed.).   Wt Readings from Last 3 Encounters:  10/02/21 141 lb (64 kg)  09/12/21 139 lb (63 kg)  08/27/21 139 lb 9.6 oz (63.3 kg)     Other studies reviewed: Additional studies/records reviewed today include: summarized above  ASSESSMENT AND PLAN:  Paroxysmal AFib CHA2DS3Vasc is 2 (age, gender) maintained on Eliquis, appropriately dosed Minimal burden if any  2. CP/SOB Do not appear to have a cardiac etiology Atypical Stress/echo looked good  Plan to have her see Dr. Curt Bears next   Disposition: back in 88mo sooner if needed   Current medicines are reviewed at length with the patient today.  The patient did not have any concerns regarding medicines.  SVenetia Night PA-C 12/01/2021 9:30 AM     [place]co[place]runsonSMoyie [place] [phone](office)  [phone](fax)

## 2021-12-02 ENCOUNTER — Ambulatory Visit: Payer: Medicare PPO | Attending: Physician Assistant | Admitting: Physician Assistant

## 2021-12-02 ENCOUNTER — Encounter: Payer: Self-pay | Admitting: Physician Assistant

## 2021-12-02 VITALS — BP 130/80 | HR 63 | Ht 67.0 in | Wt 140.8 lb

## 2021-12-02 DIAGNOSIS — R0789 Other chest pain: Secondary | ICD-10-CM | POA: Diagnosis not present

## 2021-12-02 DIAGNOSIS — I480 Paroxysmal atrial fibrillation: Secondary | ICD-10-CM | POA: Diagnosis not present

## 2021-12-02 NOTE — Patient Instructions (Signed)
Medication Instructions:   Your physician recommends that you continue on your current medications as directed. Please refer to the Current Medication list given to you today.  *If you need a refill on your cardiac medications before your next appointment, please call your pharmacy*   Lab Work: [place]   If you have labs (blood work) drawn today and your tests are completely normal, you will receive your results only by: [place] (if you have MyChart) OR A paper copy in the mail If you have any lab test that is abnormal or we need to change your treatment, we will call you to review the results.   Testing/Procedures: NONE ORDERED  TODAY    Follow-Up: At [facility], you and your health needs are our priority.  As part of our continuing mission to provide you with exceptional heart care, we have created designated Provider Care Teams.  These Care Teams include your primary Cardiologist (physician) and Advanced Practice Providers (APPs -  Physician Assistants and Nurse Practitioners) who all work together to provide you with the care you need, when you need it.  We recommend signing up for the patient portal called "MyChart".  Sign up information is provided on this After Visit Summary.  MyChart is used to connect with patients for Virtual Visits (Telemedicine).  Patients are able to view lab/test results, encounter notes, upcoming appointments, etc.  Non-urgent messages can be sent to your provider as well.   To learn more about what you can do with MyChart, go to [email or web address].    Your next appointment:   6 month(s)  The format for your next appointment:   In Person  Provider:   Allegra Lai, MD    Other Instructions   Important Information About Sugar

## 2021-12-12 ENCOUNTER — Ambulatory Visit: Payer: Medicare PPO | Admitting: Neurology

## 2021-12-12 ENCOUNTER — Encounter: Payer: Self-pay | Admitting: Neurology

## 2021-12-12 ENCOUNTER — Telehealth: Payer: Self-pay | Admitting: *Deleted

## 2021-12-12 ENCOUNTER — Other Ambulatory Visit (HOSPITAL_COMMUNITY): Payer: Self-pay

## 2021-12-12 VITALS — BP 117/63 | HR 64 | Ht 67.0 in | Wt 141.0 lb

## 2021-12-12 DIAGNOSIS — G43719 Chronic migraine without aura, intractable, without status migrainosus: Secondary | ICD-10-CM

## 2021-12-12 DIAGNOSIS — J449 Chronic obstructive pulmonary disease, unspecified: Secondary | ICD-10-CM | POA: Diagnosis not present

## 2021-12-12 DIAGNOSIS — G4734 Idiopathic sleep related nonobstructive alveolar hypoventilation: Secondary | ICD-10-CM

## 2021-12-12 DIAGNOSIS — G43009 Migraine without aura, not intractable, without status migrainosus: Secondary | ICD-10-CM | POA: Diagnosis not present

## 2021-12-12 DIAGNOSIS — R519 Headache, unspecified: Secondary | ICD-10-CM

## 2021-12-12 MED ORDER — UBRELVY 100 MG PO TABS: 100.0000 mg | ORAL_TABLET | ORAL | 0 refills | Status: DC | PRN

## 2021-12-12 MED ORDER — NURTEC 75 MG PO TBDP: 75.0000 mg | ORAL_TABLET | Freq: Every day | ORAL | 0 refills | Status: DC | PRN

## 2021-12-12 MED ORDER — EMGALITY 120 MG/ML ~~LOC~~ SOAJ
120.0000 mg | SUBCUTANEOUS | 4 refills | Status: DC
  Filled 2021-12-12 – 2022-05-19 (×2): qty 3, 90d supply, fill #0

## 2021-12-12 NOTE — Telephone Encounter (Signed)
-----   Message from Melvenia Beam, MD sent at 12/12/2021  1:48 PM EST ----- Can you please send her an overnight pulseox for nocturnal headaches (she does not use o2 overnight)

## 2021-12-12 NOTE — Telephone Encounter (Signed)
Order signed by Dr Jaynee Eagles then faxed to [facility]. Received a receipt of confirmation.

## 2021-12-12 NOTE — Patient Instructions (Addendum)
- send her an overnight pulseox for nocturnal headaches  - Ubrelvy: Please take one tablet at the onset of your headache. If it does not improve the symptoms please take one additional tablet.  - Nurtec: every other evening - stop Botox and we can restart if needed  Occipital neuralgia left side: Sounds like occipital neuralgia on the left:  Physical therapy - [facility], include manual therapy, stretching, heating, dry needling, strength exercises or any modality per evaluation.Occipital neuralgia  MRI of the cervical spine - consider MRI of the cervical spine to evaluate for surgical or interventional options (ESI) .or medial branch blocks for neck pain and occipital neuralgia in the future as needed or occipital trigger point injections Heat, lidoderm patches, posture, voltaren gel     Rimegepant Disintegrating Tablets What is this medication? RIMEGEPANT (ri ME je pant) prevents and treats migraines. It works by blocking a substance in the body that causes migraines. This medicine may be used for other purposes; ask your health care provider or pharmacist if you have questions. COMMON BRAND NAME(S): NURTEC ODT What should I tell my care team before I take this medication? They need to know if you have any of these conditions: Kidney disease Liver disease An unusual or allergic reaction to rimegepant, other medications, foods, dyes, or preservatives Pregnant or trying to get pregnant Breast-feeding How should I use this medication? Take this medication by mouth. Take it as directed on the prescription label. Leave the tablet in the sealed pack until you are ready to take it. With dry hands, open the pack and gently remove the tablet. If the tablet breaks or crumbles, throw it away. Use a new tablet. Place the tablet in the mouth and allow it to dissolve. Then, swallow it. Do not cut, crush, or chew this medication. You do not need water to take this medication. Talk to your care team about  the use of this medication in children. Special care may be needed. Overdosage: If you think you have taken too much of this medicine contact a poison control center or emergency room at once. NOTE: This medicine is only for you. Do not share this medicine with others. What if I miss a dose? This does not apply. This medication is not for regular use. What may interact with this medication? Certain medications for fungal infections, such as fluconazole, itraconazole Rifampin This list may not describe all possible interactions. Give your health care provider a list of all the medicines, herbs, non-prescription drugs, or dietary supplements you use. Also tell them if you smoke, drink alcohol, or use illegal drugs. Some items may interact with your medicine. What should I watch for while using this medication? Visit your care team for regular checks on your progress. Tell your care team if your symptoms do not start to get better or if they get worse. What side effects may I notice from receiving this medication? Side effects that you should report to your care team as soon as possible: Allergic reactions--skin rash, itching, hives, swelling of the face, lips, tongue, or throat Side effects that usually do not require medical attention (report to your care team if they continue or are bothersome): Nausea Stomach pain This list may not describe all possible side effects. Call your doctor for medical advice about side effects. You may report side effects to FDA at 1-800-FDA-1088. Where should I keep my medication? Keep out of the reach of children and pets. Store at room temperature between 20 and  25 degrees C (68 and 77 degrees F). Get rid of any unused medication after the expiration date. To get rid of medications that are no longer needed or have expired: Take the medication to a medication take-back program. Check with your pharmacy or law enforcement to find a location. If you cannot return  the medication, check the label or package insert to see if the medication should be thrown out in the garbage or flushed down the toilet. If you are not sure, ask your care team. If it is safe to put it in the trash, take the medication out of the container. Mix the medication with cat litter, dirt, coffee grounds, or other unwanted substance. Seal the mixture in a bag or container. Put it in the trash. NOTE: This sheet is a summary. It may not cover all possible information. If you have questions about this medicine, talk to your doctor, pharmacist, or health care provider.  2023 Elsevier/Gold Standard (2021-02-07 00:00:00) Ubrogepant Tablets What is this medication? UBROGEPANT (ue BROE je pant) treats migraines. It works by blocking a substance in the body that causes migraines. It is not used to prevent migraines. This medicine may be used for other purposes; ask your health care provider or pharmacist if you have questions. COMMON BRAND NAME(S): Roselyn Meier What should I tell my care team before I take this medication? They need to know if you have any of these conditions: Kidney disease Liver disease An unusual or allergic reaction to ubrogepant, other medications, foods, dyes, or preservatives Pregnant or trying to get pregnant Breast-feeding How should I use this medication? Take this medication by mouth with a glass of water. Take it as directed on the prescription label. You can take it with or without food. If it upsets your stomach, take it with food. Keep taking it unless your care team tells you to stop. Talk to your care team about the use of this medication in children. Special care may be needed. Overdosage: If you think you have taken too much of this medicine contact a poison control center or emergency room at once. NOTE: This medicine is only for you. Do not share this medicine with others. What if I miss a dose? This does not apply. This medication is not for regular  use. What may interact with this medication? Do not take this medication with any of the following: Adagrasib Ceritinib Certain antibiotics, such as chloramphenicol, clarithromycin, telithromycin Certain antivirals for HIV, such as atazanavir, cobicistat, darunavir, delavirdine, fosamprenavir, indinavir, ritonavir Certain medications for fungal infections, such as itraconazole, ketoconazole, posaconazole, voriconazole Conivaptan Grapefruit Idelalisib Mifepristone Nefazodone Ribociclib This medication may also interact with the following: Carvedilol Certain medications for seizures, such as phenobarbital, phenytoin Ciprofloxacin Cyclosporine Eltrombopag Fluconazole Fluvoxamine Quinidine Rifampin St. John's wort Verapamil This list may not describe all possible interactions. Give your health care provider a list of all the medicines, herbs, non-prescription drugs, or dietary supplements you use. Also tell them if you smoke, drink alcohol, or use illegal drugs. Some items may interact with your medicine. What should I watch for while using this medication? Visit your care team for regular checks on your progress. Tell your care team if your symptoms do not start to get better or if they get worse. Your mouth may get dry. Chewing sugarless gum or sucking hard candy and drinking plenty of water may help. Contact your care team if the problem does not go away or is severe. What side effects may I notice from receiving  this medication? Side effects that you should report to your care team as soon as possible: Allergic reactions--skin rash, itching, hives, swelling of the face, lips, tongue, or throat Side effects that usually do not require medical attention (report to your care team if they continue or are bothersome): Drowsiness Dry mouth Fatigue Nausea This list may not describe all possible side effects. Call your doctor for medical advice about side effects. You may report side  effects to FDA at 1-800-FDA-1088. Where should I keep my medication? Keep out of the reach of children and pets. Store between 15 and 30 degrees C (59 and 86 degrees F). Get rid of any unused medication after the expiration date. To get rid of medications that are no longer needed or have expired: Take the medication to a medication take-back program. Check with your pharmacy or law enforcement to find a location. If you cannot return the medication, check the label or package insert to see if the medication should be thrown out in the garbage or flushed down the toilet. If you are not sure, ask your care team. If it is safe to put it in the trash, pour the medication out of the container. Mix the medication with cat litter, dirt, coffee grounds, or other unwanted substance. Seal the mixture in a bag or container. Put it in the trash. NOTE: This sheet is a summary. It may not cover all possible information. If you have questions about this medicine, talk to your doctor, pharmacist, or health care provider.  2023 Elsevier/Gold Standard (2021-03-13 00:00:00)
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
## 2021-12-13 ENCOUNTER — Encounter: Payer: Self-pay | Admitting: Neurology

## 2021-12-13 ENCOUNTER — Other Ambulatory Visit (HOSPITAL_COMMUNITY): Payer: Self-pay

## 2021-12-13 DIAGNOSIS — G43009 Migraine without aura, not intractable, without status migrainosus: Secondary | ICD-10-CM | POA: Insufficient documentation

## 2021-12-14 ENCOUNTER — Other Ambulatory Visit (HOSPITAL_COMMUNITY): Payer: Self-pay

## 2021-12-16 ENCOUNTER — Other Ambulatory Visit (HOSPITAL_COMMUNITY): Payer: Self-pay

## 2021-12-23 DIAGNOSIS — G4730 Sleep apnea, unspecified: Secondary | ICD-10-CM | POA: Diagnosis not present

## 2021-12-23 DIAGNOSIS — R0683 Snoring: Secondary | ICD-10-CM | POA: Diagnosis not present

## 2021-12-24 ENCOUNTER — Other Ambulatory Visit (HOSPITAL_COMMUNITY): Payer: Self-pay

## 2021-12-24 DIAGNOSIS — F331 Major depressive disorder, recurrent, moderate: Secondary | ICD-10-CM | POA: Diagnosis not present

## 2021-12-24 DIAGNOSIS — F411 Generalized anxiety disorder: Secondary | ICD-10-CM | POA: Diagnosis not present

## 2021-12-24 MED ORDER — ALPRAZOLAM 0.5 MG PO TABS
0.5000 mg | ORAL_TABLET | Freq: Two times a day (BID) | ORAL | 2 refills | Status: DC | PRN
  Filled 2021-12-24 – 2022-02-04 (×2): qty 60, 30d supply, fill #0
  Filled 2022-03-13: qty 60, 30d supply, fill #1

## 2021-12-24 MED ORDER — BUPROPION HCL ER (XL) 300 MG PO TB24
300.0000 mg | ORAL_TABLET | Freq: Every day | ORAL | 1 refills | Status: DC
  Filled 2021-12-24: qty 90, 90d supply, fill #0
  Filled 2022-03-13: qty 90, 90d supply, fill #1

## 2021-12-24 MED ORDER — SERTRALINE HCL 100 MG PO TABS
100.0000 mg | ORAL_TABLET | Freq: Every day | ORAL | 1 refills | Status: DC
  Filled 2021-12-24 – 2022-03-13 (×2): qty 90, 90d supply, fill #0
  Filled 2022-06-05: qty 90, 90d supply, fill #1

## 2021-12-25 ENCOUNTER — Other Ambulatory Visit (HOSPITAL_COMMUNITY): Payer: Self-pay

## 2021-12-29 ENCOUNTER — Other Ambulatory Visit: Payer: Self-pay | Admitting: Physician Assistant

## 2021-12-29 DIAGNOSIS — I480 Paroxysmal atrial fibrillation: Secondary | ICD-10-CM

## 2021-12-30 ENCOUNTER — Other Ambulatory Visit (HOSPITAL_COMMUNITY): Payer: Self-pay

## 2021-12-30 MED ORDER — APIXABAN 5 MG PO TABS
5.0000 mg | ORAL_TABLET | Freq: Two times a day (BID) | ORAL | 1 refills | Status: DC
  Filled 2021-12-30: qty 180, 90d supply, fill #0
  Filled 2022-03-13 – 2022-04-05 (×2): qty 180, 90d supply, fill #1

## 2021-12-30 NOTE — Telephone Encounter (Signed)
Prescription refill request for Eliquis received. Indication:afib Last office visit:10/23 Scr:0.7 Age: 71 Weight:64  kg  Prescription refilled

## 2021-12-31 ENCOUNTER — Other Ambulatory Visit (HOSPITAL_COMMUNITY): Payer: Self-pay

## 2022-01-01 ENCOUNTER — Ambulatory Visit: Payer: Medicare PPO | Admitting: Neurology

## 2022-01-07 ENCOUNTER — Other Ambulatory Visit: Payer: Self-pay | Admitting: Neurology

## 2022-01-12 MED ORDER — BUTALBITAL-APAP-CAFFEINE 50-325-40 MG PO TABS
ORAL_TABLET | ORAL | 0 refills | Status: DC
  Filled 2022-01-12: qty 30, 8d supply, fill #0

## 2022-01-13 ENCOUNTER — Other Ambulatory Visit (HOSPITAL_COMMUNITY): Payer: Self-pay

## 2022-01-14 ENCOUNTER — Telehealth: Payer: Self-pay | Admitting: Neurology

## 2022-01-14 NOTE — Telephone Encounter (Signed)
Patient's overnight oxygenation and pulse look good she stayed above 92% O2 most of the night which was great, her pulse stayed around 60 between 55 and 65 which is fine please let her know thanks

## 2022-02-04 ENCOUNTER — Other Ambulatory Visit: Payer: Self-pay

## 2022-02-04 ENCOUNTER — Other Ambulatory Visit (HOSPITAL_COMMUNITY): Payer: Self-pay

## 2022-02-04 ENCOUNTER — Other Ambulatory Visit: Payer: Self-pay | Admitting: Family Medicine

## 2022-02-04 MED ORDER — ALBUTEROL SULFATE HFA 108 (90 BASE) MCG/ACT IN AERS
2.0000 | INHALATION_SPRAY | Freq: Four times a day (QID) | RESPIRATORY_TRACT | 2 refills | Status: DC | PRN
  Filled 2022-02-04: qty 6.7, 25d supply, fill #0
  Filled 2022-05-19: qty 6.7, 25d supply, fill #1
  Filled 2022-10-23: qty 6.7, 25d supply, fill #2

## 2022-02-28 ENCOUNTER — Ambulatory Visit
Admission: RE | Admit: 2022-02-28 | Discharge: 2022-02-28 | Disposition: A | Discharge status: Home / Self Care | Payer: Medicare PPO | Source: Ambulatory Visit | Attending: Family Medicine | Admitting: Family Medicine

## 2022-02-28 DIAGNOSIS — F17210 Nicotine dependence, cigarettes, uncomplicated: Secondary | ICD-10-CM

## 2022-02-28 DIAGNOSIS — Z87891 Personal history of nicotine dependence: Secondary | ICD-10-CM

## 2022-03-04 ENCOUNTER — Other Ambulatory Visit: Payer: Self-pay

## 2022-03-04 DIAGNOSIS — Z87891 Personal history of nicotine dependence: Secondary | ICD-10-CM

## 2022-03-04 DIAGNOSIS — F17210 Nicotine dependence, cigarettes, uncomplicated: Secondary | ICD-10-CM

## 2022-03-13 ENCOUNTER — Other Ambulatory Visit: Payer: Self-pay

## 2022-03-13 ENCOUNTER — Other Ambulatory Visit (HOSPITAL_COMMUNITY): Payer: Self-pay

## 2022-03-20 DIAGNOSIS — Z961 Presence of intraocular lens: Secondary | ICD-10-CM | POA: Diagnosis not present

## 2022-03-20 DIAGNOSIS — H26493 Other secondary cataract, bilateral: Secondary | ICD-10-CM | POA: Diagnosis not present

## 2022-03-20 DIAGNOSIS — H401232 Low-tension glaucoma, bilateral, moderate stage: Secondary | ICD-10-CM | POA: Diagnosis not present

## 2022-03-25 ENCOUNTER — Other Ambulatory Visit (HOSPITAL_COMMUNITY): Payer: Self-pay

## 2022-03-25 DIAGNOSIS — F331 Major depressive disorder, recurrent, moderate: Secondary | ICD-10-CM | POA: Diagnosis not present

## 2022-03-25 DIAGNOSIS — F411 Generalized anxiety disorder: Secondary | ICD-10-CM | POA: Diagnosis not present

## 2022-03-25 MED ORDER — ALPRAZOLAM 0.5 MG PO TABS
0.5000 mg | ORAL_TABLET | Freq: Two times a day (BID) | ORAL | 2 refills | Status: DC | PRN
  Filled 2022-05-19: qty 60, 30d supply, fill #0
  Filled 2022-08-06: qty 60, 30d supply, fill #1

## 2022-03-25 MED ORDER — SERTRALINE HCL 100 MG PO TABS
100.0000 mg | ORAL_TABLET | Freq: Every day | ORAL | 1 refills | Status: DC
  Filled 2022-09-15: qty 90, 90d supply, fill #0
  Filled 2022-12-11: qty 90, 90d supply, fill #1

## 2022-03-25 MED ORDER — BUPROPION HCL ER (XL) 300 MG PO TB24
300.0000 mg | ORAL_TABLET | Freq: Every day | ORAL | 1 refills | Status: DC
  Filled 2022-03-25 – 2022-06-05 (×2): qty 90, 90d supply, fill #0
  Filled 2022-09-15: qty 90, 90d supply, fill #1

## 2022-04-06 ENCOUNTER — Other Ambulatory Visit (HOSPITAL_COMMUNITY): Payer: Self-pay

## 2022-04-07 ENCOUNTER — Other Ambulatory Visit: Payer: Self-pay

## 2022-04-24 DIAGNOSIS — H26493 Other secondary cataract, bilateral: Secondary | ICD-10-CM | POA: Diagnosis not present

## 2022-05-05 ENCOUNTER — Ambulatory Visit (INDEPENDENT_AMBULATORY_CARE_PROVIDER_SITE_OTHER): Payer: Medicare PPO

## 2022-05-05 VITALS — Wt 141.0 lb

## 2022-05-05 DIAGNOSIS — Z0000 Encounter for general adult medical examination without abnormal findings: Secondary | ICD-10-CM | POA: Diagnosis not present

## 2022-05-05 NOTE — Progress Notes (Signed)
I connected with  Suzanne [name] on 05/05/22 by a audio enabled telemedicine application and verified that I am speaking with the correct person using two identifiers.  Patient Location: Home  Provider Location: Office/Clinic  I discussed the limitations of evaluation and management by telemedicine. The patient expressed understanding and agreed to proceed.      Patient Medicare AWV questionnaire was completed by the patient on 05/01/22; I have confirmed that all information answered by patient is correct and no changes since this date.      Subjective:   Suzanne [name] is a 72 y.o. female who presents for Medicare Annual (Subsequent) preventive examination.  Review of Systems     Cardiac Risk Factors include: advanced age (>48men, >57 women);smoking/ tobacco exposure     Objective:    Today's Vitals   05/05/22 1109  Weight: 141 lb (64 kg)   Body mass index is 22.08 kg/m.     05/05/2022   11:13 AM 05/03/2021    1:07 PM 09/01/2020    9:21 PM 04/27/2020    1:16 PM 03/25/2019   11:49 AM 12/27/2018    6:02 AM 12/17/2018   11:31 AM  Advanced Directives  Does Patient Have a Medical Advance Directive? Yes Yes No Yes Yes Yes Yes  Type of Paramedic of [place];Living will Healthcare Power of [place];Living will [place];Living will  Does patient want to make changes to medical advance directive?     No - Patient declined No - Patient declined   Copy of [place] in Chart? No - copy requested No - copy requested  No - copy requested No - copy requested No - copy requested   Would patient like information on creating a medical advance directive?   No - Patient declined        Current Medications (verified) Outpatient Encounter Medications as of 05/05/2022  Medication Sig   acetaminophen (TYLENOL) 325 MG tablet Take  325 mg 2 (two) times daily as needed by mouth (pain).    albuterol (VENTOLIN HFA) 108 (90 Base) MCG/ACT inhaler INHALE 2 PUFFS INTO THE LUNGS EVERY 6 HOURS AS NEEDED FOR WHEEZING OR SHORTNESS OF BREATH.   ALPRAZolam (XANAX) 0.5 MG tablet Take 1 tablet by mouth twice daily as needed for anxiety (Patient taking differently: as needed.)   apixaban (ELIQUIS) 5 MG TABS tablet Take 1 tablet (5 mg total) by mouth 2 (two) times daily.   BOTOX 200 units SOLR INJECT 200 UNITS  INTRAMUSCULARLY EVERY 3  MONTHS (GIVEN AT MD OFFICE, DISCARD UNUSED PORTION  AFTER FIRST USE)   buPROPion (WELLBUTRIN XL) 300 MG 24 hr tablet Take 1 tablet by mouth daily   butalbital-acetaminophen-caffeine (FIORICET) 50-325-40 MG tablet TAKE 1 TABLET BY MOUTH EVERY 6 HOURS AS NEEDED FOR HEADACHE.   dorzolamide-timolol (COSOPT) 2-0.5 % ophthalmic solution PLACE 1 DROP IN BOTH EYES TWO TIMES DAILY   Galcanezumab-gnlm (EMGALITY) 120 MG/ML SOAJ Inject 120 mg as directed every 30 days.   Hypromellose (ARTIFICIAL TEARS OP) Place 1 drop into both eyes daily as needed (dry eyes).    Probiotic Product (ALIGN PO) Take 4 mg by mouth as needed.   promethazine (PHENERGAN) 25 MG tablet Take 1 tablet (25 mg total) by mouth every 6 (six) hours as needed for nausea or vomiting.   Rimegepant Sulfate (NURTEC) 75 MG TBDP Take 75 mg by mouth daily as  needed. For migraines. Take as close to onset of migraine as possible. One daily maximum.   rosuvastatin (CRESTOR) 5 MG tablet Take 1 tablet (5 mg total) by mouth once a week.   sertraline (ZOLOFT) 100 MG tablet Take 1 tablet (100 mg total) by mouth daily.   Simethicone (GAS-X PO) Take 2 tablets daily as needed by mouth (gas).   sodium chloride (OCEAN) 0.65 % SOLN nasal spray Place 1 spray as needed into both nostrils for congestion.   SUMAtriptan (IMITREX) 100 MG tablet TAKE 1 TABLET BY MOUTH AT THE ONSET OF A HEADACHE DAILY AS NEEDED   Ubrogepant (UBRELVY) 100 MG TABS Take 100 mg by mouth every 2 (two)  hours as needed. Maximum 200mg  a day.   ALPRAZolam (XANAX) 0.5 MG tablet Take 1 tablet (0.5 mg total) by mouth 2 (two) times daily as needed for anxiety.   ALPRAZolam (XANAX) 0.5 MG tablet Take 1 tablet (0.5 mg total) by mouth 2 (two) times daily as needed for anxiety.   sertraline (ZOLOFT) 100 MG tablet Take 1 tablet (100 mg total) by mouth daily.   Facility-Administered Encounter Medications as of 05/05/2022  Medication   botulinum toxin Type A (BOTOX) injection 200 Units    Allergies (verified) Dilaudid [hydromorphone hcl], Fentanyl, Ciprofloxacin, Erythromycin, and Percocet [oxycodone-acetaminophen]   History: Past Medical History:  Diagnosis Date   AICD (automatic cardioverter/defibrillator) present    loop recorder   Anxiety    Anxiety state 12/14/2018   Asthma    Atrial fibrillation    s/p ablations   Atrial flutter    COPD (chronic obstructive pulmonary disease)    COVID    2022 and 2023   Degenerative arthritis    "maybe in my left knee" (12/16/2016)   Depression    Dysrhythmia 2010   A-fib   Frozen shoulder    hx bilaterally; "had to go thru PT; no problem w/them since I learned exercises" (12/16/2016)   GERD (gastroesophageal reflux disease)    Glaucoma    H/O: rheumatic fever 1957   High cholesterol    Migraine    "getting botox now; now only 2-3/month" (12/16/2016)   PONV (postoperative nausea and vomiting)    with Fentanyl,  Dilaudid &  Propofol (12/16/2016)   Past Surgical History:  Procedure Laterality Date   ABDOMINAL HYSTERECTOMY  1990   right ovary removed   [place] N/A 12/16/2016   Procedure: ATRIAL FIBRILLATION ABLATION;  Surgeon: Thompson Grayer, MD;  Location: [facility];  Service: Cardiovascular;  Laterality: N/A;   CATARACT EXTRACTION, BILATERAL     CTI ablation  04/2009   ESOPHAGOGASTRODUODENOSCOPY (EGD) WITH PROPOFOL N/A 01/06/2013   Procedure: ESOPHAGOGASTRODUODENOSCOPY (EGD) WITH PROPOFOL;   Surgeon: Cleotis Nipper, MD;  Location: [facility];  Service: Endoscopy;  Laterality: N/A;   implantable loop recorder removal  11/05/2020   MDT LINQ removed by Dr Rayann Heman   KNEE ARTHROSCOPY Bilateral 1999-2006   KNEE SURGERY Bilateral [id number]   "open; for congenital deformity"   LOOP RECORDER INSERTION N/A 10/02/2016   Procedure: LOOP RECORDER INSERTION;  Surgeon: Thompson Grayer, MD;  Location: [facility];  Service: Cardiovascular;  Laterality: N/A;   PERIPHERAL VASCULAR INTERVENTION  2/11  & 6/11   RHINOPLASTY  1965   TONSILLECTOMY  as child   TOOTH EXTRACTION  2009   2 teeth extractted   TOTAL KNEE ARTHROPLASTY Left 12/27/2018   Procedure: TOTAL KNEE ARTHROPLASTY;  Surgeon: Elsie Saas, MD;  Location: [facility];  Service: Orthopedics;  Laterality: Left;   Family History  Problem Relation Age of Onset   Cancer Mother        renal cell   Migraines Mother    Dementia Father    Heart disease Father        mid 30s cabg   Other Brother        died 5- out for run- no autopsy   Hyperlipidemia Brother        doesnt tolerate statin- now 100   Breast cancer Neg Hx    Social History   Socioeconomic History   Marital status: Married    Spouse name: Not on file   Number of children: 0   Years of education: 16   Highest education level: Not on file  Occupational History   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    Employer: [employer]   Occupation: CLINICAL NURSE CARE    Employer: [employer]  Tobacco Use   Smoking status: Every Day    Packs/day: 0.25    Years: 50.00    Additional pack years: 0.00    Total pack years: 12.50    Types: Cigarettes   Smokeless tobacco: Never   Tobacco comments:    Discussed reduce to quit, gave card with resources to help in the community  Vaping Use   Vaping Use: Never used  Substance and Sexual Activity   Alcohol use: Not Currently    Comment: 12/16/2016 "1-2 glasses of wine per year"   Drug use: No   Sexual  activity: Not Currently  Other Topics Concern   Not on file  Social History Narrative   Lives at home w/ husband and  dog(s)   Married 1984. No children.    Retired Therapist, sports      Enjoys reading- history, politics, classics, etc   Caffeine: 1 cup/day   Social Determinants of Radio broadcast assistant Strain: Low Risk  (05/01/2022)   Overall Financial Resource Strain (CARDIA)    Difficulty of Paying Living Expenses: Not hard at all  Food Insecurity: No Food Insecurity (05/01/2022)   Hunger Vital Sign    Worried About Running Out of Food in the Last Year: Never true    [place] in the Last Year: Never true  Transportation Needs: No Transportation Needs (05/01/2022)   PRAPARE - Hydrologist (Medical): No    Lack of Transportation (Non-Medical): No  Physical Activity: Patient Declined (05/01/2022)   Exercise Vital Sign    Days of Exercise per Week: Patient declined    Minutes of Exercise per Session: Patient declined  Stress: No Stress Concern Present (05/01/2022)   [employer]    Feeling of Stress : Only a little  Social Connections: Moderately Integrated (05/01/2022)   Social Connection and Isolation Panel [NHANES]    Frequency of Communication with Friends and Family: Three times a week    Frequency of Social Gatherings with Friends and Family: Twice a week    Attends Religious Services: 1 to 4 times per year    Active Member of [employer] or Organizations: No    Attends Music therapist: Never    Marital Status: Married    Tobacco Counseling Ready to quit: Not Answered Counseling given: Not Answered Tobacco comments: Discussed reduce to quit, gave card with resources to help in the community   Clinical Intake:  Pre-visit preparation completed: Yes  Pain : No/denies pain     BMI - recorded: 22.08 Nutritional Status: BMI of 19-24  Normal Nutritional Risks:  None Diabetes: No  How often do you need to have someone help you when you read instructions, pamphlets, or other written materials from your doctor or pharmacy?: 1 - Never  Diabetic?no  Interpreter Needed?: No  Information entered by :: Charlott Rakes, LPN   Activities of Daily Living    05/01/2022   10:26 AM  In your present state of health, do you have any difficulty performing the following activities:  Hearing? 0  Vision? 0  Difficulty concentrating or making decisions? 0  Walking or climbing stairs? 0  Dressing or bathing? 0  Doing errands, shopping? 0  Preparing Food and eating ? N  Using the Toilet? N  In the past six months, have you accidently leaked urine? N  Do you have problems with loss of bowel control? N  Managing your Medications? N  Managing your Finances? N  Housekeeping or managing your Housekeeping? N    Patient Care Team: Marin Olp, MD as PCP - General (Family Medicine) Thompson Grayer, MD (Inactive) as PCP - Cardiology (Cardiology) Kathrynn Ducking, MD (Inactive) as Consulting Physician (Neurology) Marius Ditch, MD (Inactive) as Consulting Physician (Sleep Medicine) Katy Apo, MD as Consulting Physician (Ophthalmology) Harriett Sine, MD as Consulting Physician (Dermatology)  Indicate any recent Medical Services you may have received from other than [facility] providers in the past year (date may be approximate).     Assessment:   This is a routine wellness examination for [place].  Hearing/Vision screen Hearing Screening - Comments:: Pt denies any hearing issues  Vision Screening - Comments:: Pt follows up with Dr Phillip Heal lyles for annual eye exams   Dietary issues and exercise activities discussed: Current Exercise Habits: The patient does not participate in regular exercise at present   Goals Addressed             This Visit's Progress    Patient Stated       Walking more        Depression Screen    05/05/2022    11:12 AM 08/01/2021    7:54 AM 05/03/2021    1:06 PM 03/18/2021    1:51 PM 07/31/2020    8:31 AM 04/27/2020    1:15 PM 07/11/2019   11:03 AM  PHQ 2/9 Scores  PHQ - 2 Score 0 0 0 0 2 1 0  PHQ- 9 Score  0  0 5  3    Fall Risk    05/01/2022   10:26 AM 05/03/2021    1:08 PM 07/31/2020    8:19 AM 07/31/2020    8:18 AM 04/27/2020    1:17 PM  Fall Risk   Falls in the past year? 0 0 0 0 0  Number falls in past yr: 0 0   0  Injury with Fall? 0 0   0  Risk for fall due to : Impaired vision   No Fall Risks Impaired vision;Impaired mobility  Follow up Falls prevention discussed Falls prevention discussed   Falls prevention discussed    FALL RISK PREVENTION PERTAINING TO THE HOME:  Any stairs in or around the home? Yes  If so, are there any without handrails? No  Home free of loose throw rugs in walkways, pet beds, electrical cords, etc? Yes  Adequate lighting in your home to reduce risk of falls? Yes  ASSISTIVE DEVICES UTILIZED TO PREVENT FALLS:  Life alert? No  Use of a cane, walker or w/c? No  Grab bars in the bathroom? Yes  Shower chair or bench in shower? No  Elevated toilet seat or a handicapped toilet? No   TIMED UP AND GO:  Was the test performed? No .   Cognitive Function:        05/05/2022   11:15 AM 05/03/2021    1:10 PM 04/27/2020    1:19 PM 03/25/2019   11:51 AM  6CIT Screen  What Year? 0 points 0 points 0 points 0 points  What month? 0 points 0 points 0 points 0 points  What time? 0 points 0 points  0 points  Count back from 20 0 points 0 points 0 points 0 points  Months in reverse 0 points 0 points 0 points 0 points  Repeat phrase 0 points 0 points 0 points 0 points  Total Score 0 points 0 points  0 points    Immunizations Immunization History  Administered Date(s) Administered   Fluad Quad(high Dose 65+) 10/19/2020   Influenza, High Dose Seasonal PF 11/08/2013, 10/10/2016, 09/24/2018, 10/06/2019, 11/07/2021   Influenza,inj,quad, With Preservative 11/07/2014    Influenza-Unspecified 10/12/2016, 10/29/2017, 10/06/2019, 10/19/2020   PFIZER(Purple Top)SARS-COV-2 Vaccination 02/27/2019, 03/21/2019, 11/04/2019, 06/12/2020, 10/30/2020   Pneumococcal Conjugate-13 03/27/2016   Pneumococcal Polysaccharide-23 12/20/2013   Respiratory Syncytial Virus Vaccine,Recomb Aduvanted(Arexvy) 11/07/2021   Tdap 02/02/2013, 09/02/2020    TDAP status: Up to date  Flu Vaccine status: Up to date  Pneumococcal vaccine status: Up to date  Covid-19 vaccine status: Completed vaccines  Qualifies for Shingles Vaccine? Yes   Zostavax completed No   Shingrix Completed?: No.    Education has been provided regarding the importance of this vaccine. Patient has been advised to call insurance company to determine out of pocket expense if they have not yet received this vaccine. Advised may also receive vaccine at local pharmacy or Health Dept. Verbalized acceptance and understanding.  Screening Tests Health Maintenance  Topic Date Due   Zoster Vaccines- Shingrix (1 of 2) Never done   COVID-19 Vaccine (6 - 2023-24 season) 10/04/2021   INFLUENZA VACCINE  09/04/2022   Medicare Annual Wellness (AWV)  05/05/2023   MAMMOGRAM  10/25/2023   COLONOSCOPY (Pts 45-71yrs Insurance coverage will need to be confirmed)  08/15/2030   DTaP/Tdap/Td (3 - Td or Tdap) 09/03/2030   Pneumonia Vaccine 10+ Years old  Completed   DEXA SCAN  Completed   Hepatitis C Screening  Completed   HPV VACCINES  Aged Out    Health Maintenance  Health Maintenance Due  Topic Date Due   Zoster Vaccines- Shingrix (1 of 2) Never done   COVID-19 Vaccine (6 - 2023-24 season) 10/04/2021    Colorectal cancer screening: Type of screening: Colonoscopy. Completed 08/14/20. Repeat every 10 years  Mammogram status: Completed 10/24/21. Repeat every year  Bone Density status: Completed 09/28/18. Results reflect: Bone density results: OSTEOPENIA. Repeat every 2 years.   Additional Screening:  Hepatitis C Screening:   Completed 07/06/18  Vision Screening: Recommended annual ophthalmology exams for early detection of glaucoma and other disorders of the eye. Is the patient up to date with their annual eye exam?  Yes  Who is the provider or what is the name of the office in which the patient attends annual eye exams? Dr Prudencio Burly  If pt is not established with a provider, would they like to be referred to a provider to establish care? No .  Dental Screening: Recommended annual dental exams for proper oral hygiene  Community Resource Referral / Chronic Care Management: CRR required this visit?  No   CCM required this visit?  No      Plan:     I have personally reviewed and noted the following in the patient's chart:   Medical and social history Use of alcohol, tobacco or illicit drugs  Current medications and supplements including opioid prescriptions. Patient is currently taking opioid prescriptions. Information provided to patient regarding non-opioid alternatives. Patient advised to discuss non-opioid treatment plan with their provider. Functional ability and status Nutritional status Physical activity Advanced directives List of other physicians Hospitalizations, surgeries, and ER visits in previous 12 months Vitals Screenings to include cognitive, depression, and falls Referrals and appointments  In addition, I have reviewed and discussed with patient certain preventive protocols, quality metrics, and best practice recommendations. A written personalized care plan for preventive services as well as general preventive health recommendations were provided to patient.     Willette Brace, LPN   [id number]   Nurse Notes: none

## 2022-05-05 NOTE — Patient Instructions (Signed)
Suzanne [name] , Thank you for taking time to come for your Medicare Wellness Visit. I appreciate your ongoing commitment to your health goals. Please review the following plan we discussed and let me know if I can assist you in the future.   These are the goals we discussed:  Goals      Patient Stated     Stay mobile and active      Patient Stated     Stay alive and keep moving      Patient Stated     Walking more         This is a list of the screening recommended for you and due dates:  Health Maintenance  Topic Date Due   Zoster (Shingles) Vaccine (1 of 2) Never done   COVID-19 Vaccine (6 - 2023-24 season) 10/04/2021   Flu Shot  09/04/2022   Medicare Annual Wellness Visit  05/05/2023   Mammogram  10/25/2023   Colon Cancer Screening  08/15/2030   DTaP/Tdap/Td vaccine (3 - Td or Tdap) 09/03/2030   Pneumonia Vaccine  Completed   DEXA scan (bone density measurement)  Completed   Hepatitis C Screening: USPSTF Recommendation to screen - Ages 56-79 yo.  Completed   HPV Vaccine  Aged Out    Advanced directives: pt stated copies in chart   Conditions/risks identified: walking more   Next appointment: Follow up in one year for your annual wellness visit    Preventive Care 65 Years and Older, Female Preventive care refers to lifestyle choices and visits with your health care provider that can promote health and wellness. What does preventive care include? A yearly physical exam. This is also called an annual well check. Dental exams once or twice a year. Routine eye exams. Ask your health care provider how often you should have your eyes checked. Personal lifestyle choices, including: Daily care of your teeth and gums. Regular physical activity. Eating a healthy diet. Avoiding tobacco and drug use. Limiting alcohol use. Practicing safe sex. Taking low-dose aspirin every day. Taking vitamin and mineral supplements as recommended by your health care provider. What  happens during an annual well check? The services and screenings done by your health care provider during your annual well check will depend on your age, overall health, lifestyle risk factors, and family history of disease. Counseling  Your health care provider may ask you questions about your: Alcohol use. Tobacco use. Drug use. Emotional well-being. Home and relationship well-being. Sexual activity. Eating habits. History of falls. Memory and ability to understand (cognition). Work and work Statistician. Reproductive health. Screening  You may have the following tests or measurements: Height, weight, and BMI. Blood pressure. Lipid and cholesterol levels. These may be checked every 5 years, or more frequently if you are over 40 years old. Skin check. Lung cancer screening. You may have this screening every year starting at age 5 if you have a 30-pack-year history of smoking and currently smoke or have quit within the past 15 years. Fecal occult blood test (FOBT) of the stool. You may have this test every year starting at age 34. Flexible sigmoidoscopy or colonoscopy. You may have a sigmoidoscopy every 5 years or a colonoscopy every 10 years starting at age 55. Hepatitis C blood test. Hepatitis B blood test. Sexually transmitted disease (STD) testing. Diabetes screening. This is done by checking your blood sugar (glucose) after you have not eaten for a while (fasting). You may have this done every 1-3 years. Bone density scan.  This is done to screen for osteoporosis. You may have this done starting at age 29. Mammogram. This may be done every 1-2 years. Talk to your health care provider about how often you should have regular mammograms. Talk with your health care provider about your test results, treatment options, and if necessary, the need for more tests. Vaccines  Your health care provider may recommend certain vaccines, such as: Influenza vaccine. This is recommended every  year. Tetanus, diphtheria, and acellular pertussis (Tdap, Td) vaccine. You may need a Td booster every 10 years. Zoster vaccine. You may need this after age 35. Pneumococcal 13-valent conjugate (PCV13) vaccine. One dose is recommended after age 23. Pneumococcal polysaccharide (PPSV23) vaccine. One dose is recommended after age 34. Talk to your health care provider about which screenings and vaccines you need and how often you need them. This information is not intended to replace advice given to you by your health care provider. Make sure you discuss any questions you have with your health care provider. Document Released: 02/16/2015 Document Revised: 10/10/2015 Document Reviewed: 11/21/2014 Elsevier Interactive Patient Education  2017 [place] Prevention in the Home Falls can cause injuries. They can happen to people of all ages. There are many things you can do to make your home safe and to help prevent falls. What can I do on the outside of my home? Regularly fix the edges of walkways and driveways and fix any cracks. Remove anything that might make you trip as you walk through a door, such as a raised step or threshold. Trim any bushes or trees on the path to your home. Use bright outdoor lighting. Clear any walking paths of anything that might make someone trip, such as rocks or tools. Regularly check to see if handrails are loose or broken. Make sure that both sides of any steps have handrails. Any raised decks and porches should have guardrails on the edges. Have any leaves, snow, or ice cleared regularly. Use sand or salt on walking paths during winter. Clean up any spills in your garage right away. This includes oil or grease spills. What can I do in the bathroom? Use night lights. Install grab bars by the toilet and in the tub and shower. Do not use towel bars as grab bars. Use non-skid mats or decals in the tub or shower. If you need to sit down in the shower, use a  plastic, non-slip stool. Keep the floor dry. Clean up any water that spills on the floor as soon as it happens. Remove soap buildup in the tub or shower regularly. Attach bath mats securely with double-sided non-slip rug tape. Do not have throw rugs and other things on the floor that can make you trip. What can I do in the bedroom? Use night lights. Make sure that you have a light by your bed that is easy to reach. Do not use any sheets or blankets that are too big for your bed. They should not hang down onto the floor. Have a firm chair that has side arms. You can use this for support while you get dressed. Do not have throw rugs and other things on the floor that can make you trip. What can I do in the kitchen? Clean up any spills right away. Avoid walking on wet floors. Keep items that you use a lot in easy-to-reach places. If you need to reach something above you, use a strong step stool that has a grab bar. Keep electrical cords  out of the way. Do not use floor polish or wax that makes floors slippery. If you must use wax, use non-skid floor wax. Do not have throw rugs and other things on the floor that can make you trip. What can I do with my stairs? Do not leave any items on the stairs. Make sure that there are handrails on both sides of the stairs and use them. Fix handrails that are broken or loose. Make sure that handrails are as long as the stairways. Check any carpeting to make sure that it is firmly attached to the stairs. Fix any carpet that is loose or worn. Avoid having throw rugs at the top or bottom of the stairs. If you do have throw rugs, attach them to the floor with carpet tape. Make sure that you have a light switch at the top of the stairs and the bottom of the stairs. If you do not have them, ask someone to add them for you. What else can I do to help prevent falls? Wear shoes that: Do not have high heels. Have rubber bottoms. Are comfortable and fit you  well. Are closed at the toe. Do not wear sandals. If you use a stepladder: Make sure that it is fully opened. Do not climb a closed stepladder. Make sure that both sides of the stepladder are locked into place. Ask someone to hold it for you, if possible. Clearly mark and make sure that you can see: Any grab bars or handrails. First and last steps. Where the edge of each step is. Use tools that help you move around (mobility aids) if they are needed. These include: Canes. Walkers. Scooters. Crutches. Turn on the lights when you go into a dark area. Replace any light bulbs as soon as they burn out. Set up your furniture so you have a clear path. Avoid moving your furniture around. If any of your floors are uneven, fix them. If there are any pets around you, be aware of where they are. Review your medicines with your doctor. Some medicines can make you feel dizzy. This can increase your chance of falling. Ask your doctor what other things that you can do to help prevent falls. This information is not intended to replace advice given to you by your health care provider. Make sure you discuss any questions you have with your health care provider. Document Released: 11/16/2008 Document Revised: 06/28/2015 Document Reviewed: 02/24/2014 Elsevier Interactive Patient Education  2017 [employer].

## 2022-05-19 ENCOUNTER — Other Ambulatory Visit: Payer: Self-pay | Admitting: Neurology

## 2022-05-19 ENCOUNTER — Encounter: Payer: Self-pay | Admitting: Neurology

## 2022-05-19 ENCOUNTER — Other Ambulatory Visit (HOSPITAL_COMMUNITY): Payer: Self-pay

## 2022-05-19 MED ORDER — PROMETHAZINE HCL 25 MG PO TABS
25.0000 mg | ORAL_TABLET | Freq: Four times a day (QID) | ORAL | 1 refills | Status: AC | PRN
  Filled 2022-05-19: qty 20, 5d supply, fill #0

## 2022-05-19 MED ORDER — SUMATRIPTAN SUCCINATE 100 MG PO TABS
ORAL_TABLET | ORAL | 0 refills | Status: DC
  Filled 2022-05-19: qty 27, 90d supply, fill #0

## 2022-05-19 MED ORDER — BUTALBITAL-APAP-CAFFEINE 50-325-40 MG PO TABS
1.0000 | ORAL_TABLET | Freq: Four times a day (QID) | ORAL | 0 refills | Status: DC | PRN
  Filled 2022-05-19: qty 30, 8d supply, fill #0

## 2022-05-19 NOTE — Telephone Encounter (Signed)
Requested Prescriptions   Pending Prescriptions Disp Refills   promethazine (PHENERGAN) 25 MG tablet 20 tablet 1    Sig: Take 1 tablet (25 mg total) by mouth every 6 (six) hours as needed for nausea or vomiting.   SUMAtriptan (IMITREX) 100 MG tablet 27 tablet 0    Sig: TAKE 1 TABLET BY MOUTH AT THE ONSET OF A HEADACHE DAILY AS NEEDED   butalbital-acetaminophen-caffeine (FIORICET) 50-325-40 MG tablet 30 tablet 0    Sig: TAKE 1 TABLET BY MOUTH EVERY 6 HOURS AS NEEDED FOR HEADACHE.   Last seen by ahern on 12/12/21, upcoming visit slack scheduled in may. Routing to provider to refill. I didn't know how to refill the two and leave the controlled for provider so as a precaution will route them all together Dispenses   Dispensed Days Supply Quantity Provider Pharmacy  butalbital-acetaminophen-caffeine [facility]) 50-325-40 MG tablet 01/13/2022 8 30 tablet Glean Salvo, NP [place] [facility]...  butalbital-acetaminophen-caffeine (FIORICET) 50-325-40 MG tablet 08/21/2021 30 30 tablet Glean Salvo, NP [place] [facility].Marland KitchenMarland Kitchen

## 2022-05-20 ENCOUNTER — Other Ambulatory Visit (HOSPITAL_COMMUNITY): Payer: Self-pay

## 2022-05-20 ENCOUNTER — Other Ambulatory Visit: Payer: Self-pay

## 2022-06-05 ENCOUNTER — Other Ambulatory Visit (HOSPITAL_COMMUNITY): Payer: Self-pay

## 2022-06-23 ENCOUNTER — Inpatient Hospital Stay (HOSPITAL_COMMUNITY)
Admission: EM | Admit: 2022-06-23 | Discharge: 2022-06-26 | DRG: 194 | Disposition: A | Discharge status: Home / Self Care | Payer: Medicare PPO | Attending: Internal Medicine | Admitting: Internal Medicine

## 2022-06-23 ENCOUNTER — Encounter (HOSPITAL_COMMUNITY): Payer: Self-pay

## 2022-06-23 ENCOUNTER — Emergency Department (HOSPITAL_COMMUNITY): Payer: Medicare PPO

## 2022-06-23 ENCOUNTER — Ambulatory Visit: Payer: Medicare PPO | Admitting: Physician Assistant

## 2022-06-23 ENCOUNTER — Other Ambulatory Visit: Payer: Self-pay

## 2022-06-23 ENCOUNTER — Encounter: Payer: Self-pay | Admitting: Physician Assistant

## 2022-06-23 VITALS — BP 130/80 | HR 89 | Temp 97.8°F | Ht 67.0 in | Wt 142.0 lb

## 2022-06-23 DIAGNOSIS — Z881 Allergy status to other antibiotic agents status: Secondary | ICD-10-CM

## 2022-06-23 DIAGNOSIS — Z96652 Presence of left artificial knee joint: Secondary | ICD-10-CM | POA: Diagnosis present

## 2022-06-23 DIAGNOSIS — R509 Fever, unspecified: Secondary | ICD-10-CM

## 2022-06-23 DIAGNOSIS — E7800 Pure hypercholesterolemia, unspecified: Secondary | ICD-10-CM | POA: Diagnosis present

## 2022-06-23 DIAGNOSIS — Z720 Tobacco use: Secondary | ICD-10-CM | POA: Diagnosis present

## 2022-06-23 DIAGNOSIS — Z90721 Acquired absence of ovaries, unilateral: Secondary | ICD-10-CM | POA: Diagnosis not present

## 2022-06-23 DIAGNOSIS — Z79899 Other long term (current) drug therapy: Secondary | ICD-10-CM

## 2022-06-23 DIAGNOSIS — J449 Chronic obstructive pulmonary disease, unspecified: Secondary | ICD-10-CM | POA: Diagnosis present

## 2022-06-23 DIAGNOSIS — E871 Hypo-osmolality and hyponatremia: Secondary | ICD-10-CM | POA: Diagnosis present

## 2022-06-23 DIAGNOSIS — J9601 Acute respiratory failure with hypoxia: Secondary | ICD-10-CM | POA: Diagnosis not present

## 2022-06-23 DIAGNOSIS — J189 Pneumonia, unspecified organism: Secondary | ICD-10-CM | POA: Diagnosis not present

## 2022-06-23 DIAGNOSIS — G43719 Chronic migraine without aura, intractable, without status migrainosus: Secondary | ICD-10-CM | POA: Diagnosis present

## 2022-06-23 DIAGNOSIS — F39 Unspecified mood [affective] disorder: Secondary | ICD-10-CM | POA: Diagnosis present

## 2022-06-23 DIAGNOSIS — Z1152 Encounter for screening for COVID-19: Secondary | ICD-10-CM | POA: Diagnosis not present

## 2022-06-23 DIAGNOSIS — J441 Chronic obstructive pulmonary disease with (acute) exacerbation: Secondary | ICD-10-CM | POA: Diagnosis not present

## 2022-06-23 DIAGNOSIS — Z90710 Acquired absence of both cervix and uterus: Secondary | ICD-10-CM | POA: Diagnosis not present

## 2022-06-23 DIAGNOSIS — F331 Major depressive disorder, recurrent, moderate: Secondary | ICD-10-CM | POA: Diagnosis not present

## 2022-06-23 DIAGNOSIS — K219 Gastro-esophageal reflux disease without esophagitis: Secondary | ICD-10-CM | POA: Diagnosis present

## 2022-06-23 DIAGNOSIS — Z7901 Long term (current) use of anticoagulants: Secondary | ICD-10-CM | POA: Diagnosis not present

## 2022-06-23 DIAGNOSIS — F411 Generalized anxiety disorder: Secondary | ICD-10-CM | POA: Diagnosis present

## 2022-06-23 DIAGNOSIS — Z9889 Other specified postprocedural states: Secondary | ICD-10-CM

## 2022-06-23 DIAGNOSIS — Z885 Allergy status to narcotic agent status: Secondary | ICD-10-CM | POA: Diagnosis not present

## 2022-06-23 DIAGNOSIS — Z8249 Family history of ischemic heart disease and other diseases of the circulatory system: Secondary | ICD-10-CM

## 2022-06-23 DIAGNOSIS — R0602 Shortness of breath: Secondary | ICD-10-CM | POA: Diagnosis not present

## 2022-06-23 DIAGNOSIS — Z95810 Presence of automatic (implantable) cardiac defibrillator: Secondary | ICD-10-CM

## 2022-06-23 DIAGNOSIS — F17210 Nicotine dependence, cigarettes, uncomplicated: Secondary | ICD-10-CM | POA: Diagnosis present

## 2022-06-23 DIAGNOSIS — E876 Hypokalemia: Secondary | ICD-10-CM | POA: Diagnosis present

## 2022-06-23 DIAGNOSIS — I480 Paroxysmal atrial fibrillation: Secondary | ICD-10-CM | POA: Diagnosis not present

## 2022-06-23 DIAGNOSIS — R0902 Hypoxemia: Secondary | ICD-10-CM | POA: Diagnosis present

## 2022-06-23 DIAGNOSIS — R5381 Other malaise: Secondary | ICD-10-CM

## 2022-06-23 DIAGNOSIS — R059 Cough, unspecified: Secondary | ICD-10-CM | POA: Diagnosis not present

## 2022-06-23 DIAGNOSIS — Z8616 Personal history of COVID-19: Secondary | ICD-10-CM

## 2022-06-23 DIAGNOSIS — R5383 Other fatigue: Secondary | ICD-10-CM

## 2022-06-23 DIAGNOSIS — Z8679 Personal history of other diseases of the circulatory system: Secondary | ICD-10-CM

## 2022-06-23 DIAGNOSIS — J440 Chronic obstructive pulmonary disease with acute lower respiratory infection: Secondary | ICD-10-CM | POA: Diagnosis present

## 2022-06-23 LAB — CBC WITH DIFFERENTIAL/PLATELET
Abs Immature Granulocytes: 0.17 10*3/uL — ABNORMAL HIGH (ref 0.00–0.07)
Basophils Absolute: 0 10*3/uL (ref 0.0–0.1)
Basophils Relative: 0 %
Eosinophils Absolute: 0.1 10*3/uL (ref 0.0–0.5)
Eosinophils Relative: 0 %
HCT: 35.1 % — ABNORMAL LOW (ref 36.0–46.0)
Hemoglobin: 11.5 g/dL — ABNORMAL LOW (ref 12.0–15.0)
Immature Granulocytes: 1 %
Lymphocytes Relative: 8 %
Lymphs Abs: 1.3 10*3/uL (ref 0.7–4.0)
MCH: 30.3 pg (ref 26.0–34.0)
MCHC: 32.8 g/dL (ref 30.0–36.0)
MCV: 92.4 fL (ref 80.0–100.0)
Monocytes Absolute: 1.5 10*3/uL — ABNORMAL HIGH (ref 0.1–1.0)
Monocytes Relative: 10 %
Neutro Abs: 12.2 10*3/uL — ABNORMAL HIGH (ref 1.7–7.7)
Neutrophils Relative %: 81 %
Platelets: 335 10*3/uL (ref 150–400)
RBC: 3.8 MIL/uL — ABNORMAL LOW (ref 3.87–5.11)
RDW: 12.5 % (ref 11.5–15.5)
WBC: 15.2 10*3/uL — ABNORMAL HIGH (ref 4.0–10.5)
nRBC: 0 % (ref 0.0–0.2)

## 2022-06-23 LAB — BASIC METABOLIC PANEL
Anion gap: 11 (ref 5–15)
BUN: 8 mg/dL (ref 8–23)
CO2: 26 mmol/L (ref 22–32)
Calcium: 8.5 mg/dL — ABNORMAL LOW (ref 8.9–10.3)
Chloride: 93 mmol/L — ABNORMAL LOW (ref 98–111)
Creatinine, Ser: 0.65 mg/dL (ref 0.44–1.00)
GFR, Estimated: >60 mL/min (ref >60)
Glucose, Bld: 109 mg/dL — ABNORMAL HIGH (ref 70–99)
Potassium: 3.2 mmol/L — ABNORMAL LOW (ref 3.5–5.1)
Sodium: 130 mmol/L — ABNORMAL LOW (ref 135–145)

## 2022-06-23 LAB — EXPECTORATED SPUTUM ASSESSMENT W GRAM STAIN, RFLX TO RESP C

## 2022-06-23 LAB — HEPATIC FUNCTION PANEL
ALT: 16 U/L (ref 0–44)
AST: 18 U/L (ref 15–41)
Albumin: 3.1 g/dL — ABNORMAL LOW (ref 3.5–5.0)
Alkaline Phosphatase: 75 U/L (ref 38–126)
Bilirubin, Direct: 0.1 mg/dL (ref 0.0–0.2)
Indirect Bilirubin: 0.4 mg/dL (ref 0.3–0.9)
Total Bilirubin: 0.5 mg/dL (ref 0.3–1.2)
Total Protein: 6.8 g/dL (ref 6.5–8.1)

## 2022-06-23 LAB — RESP PANEL BY RT-PCR (RSV, FLU A&B, COVID)  RVPGX2
Influenza A by PCR: NEGATIVE
Influenza B by PCR: NEGATIVE
Resp Syncytial Virus by PCR: NEGATIVE
SARS Coronavirus 2 by RT PCR: NEGATIVE

## 2022-06-23 LAB — TROPONIN I (HIGH SENSITIVITY)
Troponin I (High Sensitivity): 13 ng/L (ref <18)
Troponin I (High Sensitivity): 11 ng/L (ref <18)

## 2022-06-23 LAB — POC INFLUENZA A&B (BINAX/QUICKVUE)
Influenza A, POC: NEGATIVE
Influenza B, POC: NEGATIVE

## 2022-06-23 MED ORDER — METHYLPREDNISOLONE SODIUM SUCC 40 MG IJ SOLR
40.0000 mg | Freq: Two times a day (BID) | INTRAMUSCULAR | Status: DC
  Administered 2022-06-23 – 2022-06-26 (×6): 40 mg via INTRAVENOUS
  Filled 2022-06-23 (×6): qty 1

## 2022-06-23 MED ORDER — DOXYCYCLINE HYCLATE 100 MG PO TABS
100.0000 mg | ORAL_TABLET | Freq: Two times a day (BID) | ORAL | Status: DC
  Administered 2022-06-23 – 2022-06-26 (×6): 100 mg via ORAL
  Filled 2022-06-23 (×6): qty 1

## 2022-06-23 MED ORDER — ALPRAZOLAM 0.5 MG PO TABS
0.5000 mg | ORAL_TABLET | Freq: Two times a day (BID) | ORAL | Status: DC | PRN
  Administered 2022-06-24 – 2022-06-25 (×3): 0.5 mg via ORAL
  Filled 2022-06-23 (×4): qty 1

## 2022-06-23 MED ORDER — SERTRALINE HCL 50 MG PO TABS
100.0000 mg | ORAL_TABLET | Freq: Every day | ORAL | Status: DC
  Administered 2022-06-24 – 2022-06-26 (×3): 100 mg via ORAL
  Filled 2022-06-23 (×3): qty 2

## 2022-06-23 MED ORDER — ALBUTEROL SULFATE (2.5 MG/3ML) 0.083% IN NEBU
2.5000 mg | INHALATION_SOLUTION | Freq: Four times a day (QID) | RESPIRATORY_TRACT | Status: DC
  Administered 2022-06-23: 2.5 mg via RESPIRATORY_TRACT
  Filled 2022-06-23: qty 3

## 2022-06-23 MED ORDER — ALBUTEROL SULFATE (2.5 MG/3ML) 0.083% IN NEBU: 2.5000 mg | INHALATION_SOLUTION | RESPIRATORY_TRACT | Status: DC | PRN

## 2022-06-23 MED ORDER — SUMATRIPTAN SUCCINATE 50 MG PO TABS: 100.0000 mg | ORAL_TABLET | ORAL | Status: DC | PRN

## 2022-06-23 MED ORDER — ROSUVASTATIN CALCIUM 5 MG PO TABS
5.0000 mg | ORAL_TABLET | ORAL | Status: DC
  Administered 2022-06-23: 5 mg via ORAL
  Filled 2022-06-23: qty 1

## 2022-06-23 MED ORDER — ALBUTEROL SULFATE (2.5 MG/3ML) 0.083% IN NEBU
2.5000 mg | INHALATION_SOLUTION | Freq: Once | RESPIRATORY_TRACT | Status: AC
  Administered 2022-06-23: 2.5 mg via RESPIRATORY_TRACT
  Filled 2022-06-23: qty 3

## 2022-06-23 MED ORDER — POLYETHYLENE GLYCOL 3350 17 G PO PACK: 17.0000 g | PACK | Freq: Every day | ORAL | Status: DC | PRN

## 2022-06-23 MED ORDER — METHYLPREDNISOLONE SODIUM SUCC 125 MG IJ SOLR
125.0000 mg | Freq: Once | INTRAMUSCULAR | Status: AC
  Administered 2022-06-23: 125 mg via INTRAVENOUS
  Filled 2022-06-23: qty 2

## 2022-06-23 MED ORDER — DIPHENHYDRAMINE HCL 50 MG/ML IJ SOLN: 12.5000 mg | Freq: Three times a day (TID) | INTRAMUSCULAR | Status: DC | PRN

## 2022-06-23 MED ORDER — POTASSIUM CHLORIDE CRYS ER 20 MEQ PO TBCR
40.0000 meq | EXTENDED_RELEASE_TABLET | Freq: Once | ORAL | Status: AC
  Administered 2022-06-23: 40 meq via ORAL
  Filled 2022-06-23: qty 2

## 2022-06-23 MED ORDER — SODIUM CHLORIDE 0.9 % IV SOLN
2.0000 g | INTRAVENOUS | Status: DC
  Administered 2022-06-24 – 2022-06-25 (×2): 2 g via INTRAVENOUS
  Filled 2022-06-23 (×2): qty 20

## 2022-06-23 MED ORDER — IPRATROPIUM-ALBUTEROL 0.5-2.5 (3) MG/3ML IN SOLN
3.0000 mL | Freq: Once | RESPIRATORY_TRACT | Status: AC
  Administered 2022-06-23: 3 mL via RESPIRATORY_TRACT
  Filled 2022-06-23: qty 3

## 2022-06-23 MED ORDER — APIXABAN 5 MG PO TABS
5.0000 mg | ORAL_TABLET | Freq: Two times a day (BID) | ORAL | Status: DC
  Administered 2022-06-23 – 2022-06-26 (×6): 5 mg via ORAL
  Filled 2022-06-23 (×6): qty 1

## 2022-06-23 MED ORDER — IPRATROPIUM BROMIDE 0.02 % IN SOLN
0.5000 mg | Freq: Four times a day (QID) | RESPIRATORY_TRACT | Status: DC
  Administered 2022-06-23: 0.5 mg via RESPIRATORY_TRACT
  Filled 2022-06-23: qty 2.5

## 2022-06-23 MED ORDER — ACETAMINOPHEN 325 MG PO TABS
650.0000 mg | ORAL_TABLET | Freq: Four times a day (QID) | ORAL | Status: DC | PRN
  Administered 2022-06-24 – 2022-06-25 (×2): 650 mg via ORAL
  Filled 2022-06-23 (×2): qty 2

## 2022-06-23 MED ORDER — ONDANSETRON HCL 4 MG PO TABS: 4.0000 mg | ORAL_TABLET | Freq: Four times a day (QID) | ORAL | Status: DC | PRN

## 2022-06-23 MED ORDER — SODIUM CHLORIDE 0.9 % IV SOLN: 12.5000 mg | Freq: Three times a day (TID) | INTRAVENOUS | Status: DC | PRN

## 2022-06-23 MED ORDER — BUPROPION HCL ER (XL) 150 MG PO TB24
300.0000 mg | ORAL_TABLET | Freq: Every day | ORAL | Status: DC
  Administered 2022-06-24 – 2022-06-26 (×3): 300 mg via ORAL
  Filled 2022-06-23 (×3): qty 2

## 2022-06-23 MED ORDER — GUAIFENESIN ER 600 MG PO TB12
600.0000 mg | ORAL_TABLET | Freq: Two times a day (BID) | ORAL | Status: DC
  Administered 2022-06-23 – 2022-06-24 (×2): 600 mg via ORAL
  Filled 2022-06-23 (×2): qty 1

## 2022-06-23 MED ORDER — SIMETHICONE 80 MG PO CHEW: 80.0000 mg | CHEWABLE_TABLET | Freq: Four times a day (QID) | ORAL | Status: DC | PRN

## 2022-06-23 MED ORDER — ONDANSETRON HCL 4 MG/2ML IJ SOLN: 4.0000 mg | Freq: Four times a day (QID) | INTRAMUSCULAR | Status: DC | PRN

## 2022-06-23 MED ORDER — SODIUM CHLORIDE 0.9 % IV SOLN
2.0000 g | Freq: Once | INTRAVENOUS | Status: AC
  Administered 2022-06-23: 2 g via INTRAVENOUS
  Filled 2022-06-23: qty 20

## 2022-06-23 MED ORDER — SODIUM CHLORIDE 0.9 % IV SOLN
500.0000 mg | Freq: Once | INTRAVENOUS | Status: DC
  Administered 2022-06-23: 500 mg via INTRAVENOUS
  Filled 2022-06-23: qty 5

## 2022-06-23 MED ORDER — MAGNESIUM SULFATE 2 GM/50ML IV SOLN
2.0000 g | Freq: Once | INTRAVENOUS | Status: AC
  Administered 2022-06-23: 2 g via INTRAVENOUS
  Filled 2022-06-23: qty 50

## 2022-06-23 MED ORDER — ACETAMINOPHEN 650 MG RE SUPP: 650.0000 mg | Freq: Four times a day (QID) | RECTAL | Status: DC | PRN

## 2022-06-23 MED ORDER — IPRATROPIUM-ALBUTEROL 0.5-2.5 (3) MG/3ML IN SOLN
3.0000 mL | Freq: Four times a day (QID) | RESPIRATORY_TRACT | Status: DC
  Administered 2022-06-24: 3 mL via RESPIRATORY_TRACT
  Filled 2022-06-23: qty 3

## 2022-06-23 MED ORDER — SODIUM CHLORIDE 0.9 % IV BOLUS
500.0000 mL | Freq: Once | INTRAVENOUS | Status: AC
  Administered 2022-06-23: 500 mL via INTRAVENOUS

## 2022-06-23 NOTE — H&P (Addendum)
History and Physical    Patient: Suzanne [name] BJY:[id number] DOB: 1950/06/29 DOA: 06/23/2022 DOS: the patient was seen and examined on 06/23/2022 PCP: Shelva Majestic, MD  Patient coming from: Home  Chief Complaint:  Chief Complaint  Patient presents with   Weakness   Cough   HPI: Suzanne [name] is a 72 y.o. female with medical history significant of PAF on Eliquis, COPD, tobacco use, anxiety disorder who presented to the ED with the above noted complaints.  Per patient-her symptoms started on May 10-when she was on her way to [facility] she started having a cough-before going to [place] checked a home COVID test-and it was negative.  While in [place] became productive-she started having intermittent fever, nausea and vomiting.  She was however able to continue on her trip, she returned back yesterday evening, and continued to have the symptoms-as result she presented to the ED where she was found to have possible RLL PNA with COPD exacerbation.  She reportedly had pulse ox in the mid to high 80s on room air when she first presented.  The hospitalist service was asked to admit this patient for further evaluation and treatment.    Per patient-cough is productive-greenish/yellow-colored.  No hemoptysis. She does have some mild exertional dyspnea-she apparently was wheezing when she first came in to the hospital-she was given Rocephin/Zithromax/Solu-Medrol and is already feeling better.  + Intermittent fever + Almost daily migraine headaches-responsive to Imitrex + No chest pain + Some shortness of breath as outlined above No chest pain No abdominal pain No hematuria/dysuria   Review of Systems: As mentioned in the history of present illness. All other systems reviewed and are negative. Past Medical History:  Diagnosis Date   AICD (automatic cardioverter/defibrillator) present    loop recorder   Anxiety    Anxiety state 12/14/2018   Asthma    Atrial  fibrillation (HCC)    s/p ablations   Atrial flutter (HCC)    COPD (chronic obstructive pulmonary disease) (HCC)    COVID    2022 and 2023   Degenerative arthritis    "maybe in my left knee" (12/16/2016)   Depression    Dysrhythmia 2010   A-fib   Frozen shoulder    hx bilaterally; "had to go thru PT; no problem w/them since I learned exercises" (12/16/2016)   GERD (gastroesophageal reflux disease)    Glaucoma    H/O: rheumatic fever 1957   High cholesterol    Migraine    "getting botox now; now only 2-3/month" (12/16/2016)   PONV (postoperative nausea and vomiting)    with Fentanyl,  Dilaudid &  Propofol (12/16/2016)   Past Surgical History:  Procedure Laterality Date   ABDOMINAL HYSTERECTOMY  1990   right ovary removed   APPENDECTOMY  1990   ATRIAL FIBRILLATION ABLATION N/A 12/16/2016   Procedure: ATRIAL FIBRILLATION ABLATION;  Surgeon: Hillis Range, MD;  Location: [facility];  Service: Cardiovascular;  Laterality: N/A;   CATARACT EXTRACTION, BILATERAL     CTI ablation  04/2009   ESOPHAGOGASTRODUODENOSCOPY (EGD) WITH PROPOFOL N/A 01/06/2013   Procedure: ESOPHAGOGASTRODUODENOSCOPY (EGD) WITH PROPOFOL;  Surgeon: Florencia Reasons, MD;  Location: [facility];  Service: Endoscopy;  Laterality: N/A;   implantable loop recorder removal  11/05/2020   MDT LINQ removed by Dr Johney Frame   KNEE ARTHROSCOPY Bilateral 1999-2006   KNEE SURGERY Bilateral [id number]   "open; for congenital deformity"   LOOP RECORDER INSERTION N/A 10/02/2016   Procedure: LOOP RECORDER INSERTION;  Surgeon:  Hillis Range, MD;  Location: [facility];  Service: Cardiovascular;  Laterality: N/A;   PERIPHERAL VASCULAR INTERVENTION  2/11  & 6/11   RHINOPLASTY  1965   TONSILLECTOMY  as child   TOOTH EXTRACTION  2009   2 teeth extractted   TOTAL KNEE ARTHROPLASTY Left 12/27/2018   Procedure: TOTAL KNEE ARTHROPLASTY;  Surgeon: Salvatore Marvel, MD;  Location: [facility];  Service: Orthopedics;  Laterality:  Left;   Social History:  reports that she has been smoking cigarettes. She has a 12.50 pack-year smoking history. She has never used smokeless tobacco. She reports that she does not currently use alcohol. She reports that she does not use drugs.  Allergies  Allergen Reactions   Dilaudid [Hydromorphone Hcl] Nausea And Vomiting   Fentanyl Nausea And Vomiting, Other (See Comments) and Nausea Only   Ciprofloxacin Rash   Erythromycin Rash   Percocet [Oxycodone-Acetaminophen] Nausea And Vomiting    Family History  Problem Relation Age of Onset   Cancer Mother        renal cell   Migraines Mother    Dementia Father    Heart disease Father        mid 52s cabg   Other Brother        died 57- out for run- no autopsy   Hyperlipidemia Brother        doesnt tolerate statin- now 32   Breast cancer Neg Hx     Prior to Admission medications   Medication Sig Start Date End Date Taking? Authorizing Provider  acetaminophen (TYLENOL) 325 MG tablet Take 325 mg 2 (two) times daily as needed by mouth (pain).     [provider]  albuterol (VENTOLIN HFA) 108 (90 Base) MCG/ACT inhaler INHALE 2 PUFFS INTO THE LUNGS EVERY 6 HOURS AS NEEDED FOR WHEEZING OR SHORTNESS OF BREATH. 02/04/22 02/04/23  Shelva Majestic, MD  ALPRAZolam Prudy Feeler) 0.5 MG tablet Take 1 tablet (0.5 mg total) by mouth 2 (two) times daily as needed for anxiety. 03/25/22     apixaban (ELIQUIS) 5 MG TABS tablet Take 1 tablet (5 mg total) by mouth 2 (two) times daily. 12/30/21 12/30/22  Sheilah Pigeon, PA-C  buPROPion (WELLBUTRIN XL) 300 MG 24 hr tablet Take 1 tablet by mouth daily 03/25/22     butalbital-acetaminophen-caffeine (FIORICET) 50-325-40 MG tablet Take 1 tablet by mouth every 6 (six) hours as needed. For headache 05/19/22 05/19/23  Glean Salvo, NP  dorzolamide-timolol (COSOPT) 2-0.5 % ophthalmic solution PLACE 1 DROP IN BOTH EYES TWO TIMES DAILY 07/16/21   Shelva Majestic, MD  Galcanezumab-gnlm [facility]) 120 MG/ML SOAJ  Inject 120 mg as directed every 30 days. 12/12/21   Anson Fret, MD  Hypromellose (ARTIFICIAL TEARS OP) Place 1 drop into both eyes daily as needed (dry eyes).     [provider]  Probiotic Product (ALIGN PO) Take 4 mg by mouth as needed.    [provider]  promethazine (PHENERGAN) 25 MG tablet Take 1 tablet (25 mg total) by mouth every 6 (six) hours as needed for nausea or vomiting. 05/19/22   Glean Salvo, NP  Rimegepant Sulfate (NURTEC) 75 MG TBDP Take 75 mg by mouth daily as needed. For migraines. Take as close to onset of migraine as possible. One daily maximum. Patient not taking: Reported on 06/23/2022 12/12/21   Anson Fret, MD  rosuvastatin (CRESTOR) 5 MG tablet Take 1 tablet (5 mg total) by mouth once a week. 08/02/21  Shelva Majestic, MD  sertraline (ZOLOFT) 100 MG tablet Take 1 tablet (100 mg total) by mouth daily. 03/25/22     Simethicone (GAS-X PO) Take 2 tablets daily as needed by mouth (gas).    [provider]  sodium chloride (OCEAN) 0.65 % SOLN nasal spray Place 1 spray as needed into both nostrils for congestion.    [provider]  SUMAtriptan (IMITREX) 100 MG tablet TAKE 1 TABLET BY MOUTH AT THE ONSET OF A HEADACHE DAILY AS NEEDED 05/19/22   Glean Salvo, NP  timolol (TIMOPTIC) 0.5 % ophthalmic solution Place 1 drop into both eyes 2 (two) times daily. 10/20/16   [provider]  Ubrogepant (UBRELVY) 100 MG TABS Take 100 mg by mouth every 2 (two) hours as needed. Maximum 200mg  a day. 12/12/21   Anson Fret, MD    Physical Exam: Vitals:   06/23/22 1830 06/23/22 1900 06/23/22 1915 06/23/22 1929  BP: 139/70 129/79 127/82   Pulse: 70 78 77   Resp: 18 19 15    Temp:      TempSrc:      SpO2: 97% 94% 93% 94%   Gen Exam:Alert awake-not in any distress HEENT:atraumatic, normocephalic Chest: B/L clear to auscultation anteriorly CVS:S1S2 regular Abdomen:soft non tender, non distended Extremities:no edema Neurology:  Non focal Skin: no rash  Data Reviewed:    Latest Ref Rng & Units 06/23/2022    5:13 PM 08/01/2021    8:38 AM 07/31/2020    9:05 AM  CBC  WBC 4.0 - 10.5 K/uL 15.2  6.5  6.4   Hemoglobin 12.0 - 15.0 g/dL 13.0  86.5  78.4   Hematocrit 36.0 - 46.0 % 35.1  40.6  39.9   Platelets 150 - 400 K/uL 335  165.0  225.0         Latest Ref Rng & Units 06/23/2022    5:13 PM 08/01/2021    8:38 AM 07/31/2020    9:05 AM  BMP  Glucose 70 - 99 mg/dL 696  91  86   BUN 8 - 23 mg/dL 8  13  17    Creatinine 0.44 - 1.00 mg/dL 2.95  2.84  1.32   Sodium 135 - 145 mmol/L 130  137  136   Potassium 3.5 - 5.1 mmol/L 3.2  4.2  4.2   Chloride 98 - 111 mmol/L 93  102  100   CO2 22 - 32 mmol/L 26  29  29    Calcium 8.9 - 10.3 mg/dL 8.5  9.6  9.8      Assessment and Plan: Acute hypoxic respiratory failure-likely due to combination of COPD exacerbation/RLL PNA Ongoing symptoms for at least 10 days-symptoms started before her trip to [place] Presumed atypical pneumonitis-will continue Rocephin/Zithromax-and continue steroids/bronchodilators for COPD exacerbation Sputum cultures will be obtained Respiratory virus panel will be obtained Hopefully she will respond to the above-noted measures-if not-she will need CT chest to delineate her lung parenchyma further.  It is unlikely that she has PE at this point-as her symptoms started before she left for [place]-and she has been compliant with Eliquis.  PAF Sinus rhythm Telemetry monitoring Continue Eliquis  GERD Crestor  Migraine headaches Imitrex as needed Phenergan/Benadryl for severe intractable headaches not relieved by Imitrex Patient is also on maintenance Emgality as an outpatient. Mood disorder Stable-continue as needed Xanax along with Wellbutrin/Zoloft  Tobacco abuse Smokes approximately 5 cigarettes daily-however she has not had a cigarette in the past 10 days because she has been acutely  ill. Counseling prior to discharge.   Advance Care Planning:    Code Status: Full Code   Consults: None  Family Communication: Spouse at bedside  Severity of Illness: The appropriate patient status for this patient is INPATIENT. Inpatient status is judged to be reasonable and necessary in order to provide the required intensity of service to ensure the patient's safety. The patient's presenting symptoms, physical exam findings, and initial radiographic and laboratory data in the context of their chronic comorbidities is felt to place them at high risk for further clinical deterioration. Furthermore, it is not anticipated that the patient will be medically stable for discharge from the hospital within 2 midnights of admission.   * I certify that at the point of admission it is my clinical judgment that the patient will require inpatient hospital care spanning beyond 2 midnights from the point of admission due to high intensity of service, high risk for further deterioration and high frequency of surveillance required.*  Author: Jeoffrey Massed, MD 06/23/2022 8:08 PM  For on call review [email or web address].

## 2022-06-23 NOTE — ED Notes (Signed)
  Patient ambulated off O2 to bathroom with standby assist.  Patient came back to room with labored breathing and was 87-88% on RA.  Patient placed back on 2L O2 and SPO2 back up to 95%.  Dr Estell Harpin notified.

## 2022-06-23 NOTE — Patient Instructions (Signed)
Vitals:   06/23/22 1418  BP: 130/80  Pulse: 89  Temp: 97.8 F (36.6 C)  SpO2: 90%

## 2022-06-23 NOTE — ED Notes (Signed)
Pt ambulatory to the restroom.  

## 2022-06-23 NOTE — ED Triage Notes (Signed)
Cough, weakness, fatigue for 10 days. Pt states cough is productive and is dark green mucus. PCP sent pt to rule out pneumonia. Pt 88% on RA at home and went up to 90% on arrival. Pt reports fevers at home that have been controlled with tylenol.

## 2022-06-23 NOTE — Progress Notes (Signed)
Suzanne [name] is a 72 y.o. female here for a new problem.  History of Present Illness:   Chief Complaint  Patient presents with   Cough    Pt c/o cough, nausea, vomiting, diarrhea and fatigue, headache started on May 10th did not see anyone in [place]. Pt was out of the country returned last evening. She has done 2 COVID test both Neg. Fever off and on, fever of 101 this morning. Coughing and expectorating yellow sputum.    HPI  Cough: She complains of intermittent productive cough, nausea, vomiting, diarrhea, fatigue, and headache starting 5/10.  She reports her symptoms began while traveling to [place].  She took 2 Covid tests before going to the airport, both were negative.  While on the plane she reports coughing, chills, ears popping, migraine, and nausea began. When she got to the hotel she rested a whole afternoon and temporarily felt better on Saturday 5/11. By Sunday 5/12, her symptoms worsened and reports having sinus and chest congestion.  She has been feeling dehydrated, despite her drinking plenty of water.  She reports an intermittent fever and a reading this morning of 101. She also reports an oxygen saturation of 89, blood pressure 137/77 and HR of 90 bpm this morning.  She states her usual o2 sat is around 97%. Has hx of COPD and A-fib, and uses Albuterol prn She has been having difficulty sleeping and frequently wakes up at night.  She returned from her trip yesterday evening.   She is currently in a wheelchair and states that if she takes a few steps that she gets very SHORTNESS OF BREATH.   Past Medical History:  Diagnosis Date   AICD (automatic cardioverter/defibrillator) present    loop recorder   Anxiety    Anxiety state 12/14/2018   Asthma    Atrial fibrillation (HCC)    s/p ablations   Atrial flutter (HCC)    COPD (chronic obstructive pulmonary disease) (HCC)    COVID    2022 and 2023   Degenerative arthritis    "maybe in my left knee"  (12/16/2016)   Depression    Dysrhythmia 2010   A-fib   Frozen shoulder    hx bilaterally; "had to go thru PT; no problem w/them since I learned exercises" (12/16/2016)   GERD (gastroesophageal reflux disease)    Glaucoma    H/O: rheumatic fever 1957   High cholesterol    Migraine    "getting botox now; now only 2-3/month" (12/16/2016)   PONV (postoperative nausea and vomiting)    with Fentanyl,  Dilaudid &  Propofol (12/16/2016)     Social History   Tobacco Use   Smoking status: Every Day    Packs/day: 0.25    Years: 50.00    Additional pack years: 0.00    Total pack years: 12.50    Types: Cigarettes   Smokeless tobacco: Never   Tobacco comments:    Discussed reduce to quit, gave card with resources to help in the community  Vaping Use   Vaping Use: Never used  Substance Use Topics   Alcohol use: Not Currently    Comment: 12/16/2016 "1-2 glasses of wine per year"   Drug use: No    Past Surgical History:  Procedure Laterality Date   ABDOMINAL HYSTERECTOMY  1990   right ovary removed   APPENDECTOMY  1990   ATRIAL FIBRILLATION ABLATION N/A 12/16/2016   Procedure: ATRIAL FIBRILLATION ABLATION;  Surgeon: Hillis Range, MD;  Location: [facility];  Service: Cardiovascular;  Laterality: N/A;   CATARACT EXTRACTION, BILATERAL     CTI ablation  04/2009   ESOPHAGOGASTRODUODENOSCOPY (EGD) WITH PROPOFOL N/A 01/06/2013   Procedure: ESOPHAGOGASTRODUODENOSCOPY (EGD) WITH PROPOFOL;  Surgeon: Florencia Reasons, MD;  Location: [facility];  Service: Endoscopy;  Laterality: N/A;   implantable loop recorder removal  11/05/2020   MDT LINQ removed by Dr Johney Frame   KNEE ARTHROSCOPY Bilateral 1999-2006   KNEE SURGERY Bilateral 4540,9811   "open; for congenital deformity"   LOOP RECORDER INSERTION N/A 10/02/2016   Procedure: LOOP RECORDER INSERTION;  Surgeon: Hillis Range, MD;  Location: [facility];  Service: Cardiovascular;  Laterality: N/A;   PERIPHERAL VASCULAR  INTERVENTION  2/11  & 6/11   RHINOPLASTY  1965   TONSILLECTOMY  as child   TOOTH EXTRACTION  2009   2 teeth extractted   TOTAL KNEE ARTHROPLASTY Left 12/27/2018   Procedure: TOTAL KNEE ARTHROPLASTY;  Surgeon: Salvatore Marvel, MD;  Location: [facility];  Service: Orthopedics;  Laterality: Left;    Family History  Problem Relation Age of Onset   Cancer Mother        renal cell   Migraines Mother    Dementia Father    Heart disease Father        mid 19s cabg   Other Brother        died 40- out for run- no autopsy   Hyperlipidemia Brother        doesnt tolerate statin- now 44   Breast cancer Neg Hx     Allergies  Allergen Reactions   Dilaudid [Hydromorphone Hcl] Nausea And Vomiting   Fentanyl Nausea And Vomiting, Other (See Comments) and Nausea Only   Ciprofloxacin Rash   Erythromycin Rash   Percocet [Oxycodone-Acetaminophen] Nausea And Vomiting    Current Medications:   Current Outpatient Medications:    acetaminophen (TYLENOL) 325 MG tablet, Take 325 mg 2 (two) times daily as needed by mouth (pain). , Disp: , Rfl:    albuterol (VENTOLIN HFA) 108 (90 Base) MCG/ACT inhaler, INHALE 2 PUFFS INTO THE LUNGS EVERY 6 HOURS AS NEEDED FOR WHEEZING OR SHORTNESS OF BREATH., Disp: 6.7 g, Rfl: 2   ALPRAZolam (XANAX) 0.5 MG tablet, Take 1 tablet (0.5 mg total) by mouth 2 (two) times daily as needed for anxiety., Disp: 60 tablet, Rfl: 2   apixaban (ELIQUIS) 5 MG TABS tablet, Take 1 tablet (5 mg total) by mouth 2 (two) times daily., Disp: 180 tablet, Rfl: 1   buPROPion (WELLBUTRIN XL) 300 MG 24 hr tablet, Take 1 tablet by mouth daily, Disp: 90 tablet, Rfl: 1   butalbital-acetaminophen-caffeine (FIORICET) 50-325-40 MG tablet, Take 1 tablet by mouth every 6 (six) hours as needed. For headache, Disp: 30 tablet, Rfl: 0   dorzolamide-timolol (COSOPT) 2-0.5 % ophthalmic solution, PLACE 1 DROP IN BOTH EYES TWO TIMES DAILY, Disp: 20 mL, Rfl: 4   Galcanezumab-gnlm (EMGALITY) 120 MG/ML SOAJ, Inject 120 mg  as directed every 30 days., Disp: 3 mL, Rfl: 4   Hypromellose (ARTIFICIAL TEARS OP), Place 1 drop into both eyes daily as needed (dry eyes). , Disp: , Rfl:    Probiotic Product (ALIGN PO), Take 4 mg by mouth as needed., Disp: , Rfl:    promethazine (PHENERGAN) 25 MG tablet, Take 1 tablet (25 mg total) by mouth every 6 (six) hours as needed for nausea or vomiting., Disp: 20 tablet, Rfl: 1   rosuvastatin (CRESTOR) 5 MG tablet, Take 1 tablet (5 mg total) by mouth once  a week., Disp: 13 tablet, Rfl: 3   sertraline (ZOLOFT) 100 MG tablet, Take 1 tablet (100 mg total) by mouth daily., Disp: 90 tablet, Rfl: 1   Simethicone (GAS-X PO), Take 2 tablets daily as needed by mouth (gas)., Disp: , Rfl:    sodium chloride (OCEAN) 0.65 % SOLN nasal spray, Place 1 spray as needed into both nostrils for congestion., Disp: , Rfl:    SUMAtriptan (IMITREX) 100 MG tablet, TAKE 1 TABLET BY MOUTH AT THE ONSET OF A HEADACHE DAILY AS NEEDED, Disp: 27 tablet, Rfl: 0   timolol (TIMOPTIC) 0.5 % ophthalmic solution, Place 1 drop into both eyes 2 (two) times daily., Disp: , Rfl:    Ubrogepant (UBRELVY) 100 MG TABS, Take 100 mg by mouth every 2 (two) hours as needed. Maximum 200mg  a day., Disp: 4 tablet, Rfl: 0   Rimegepant Sulfate (NURTEC) 75 MG TBDP, Take 75 mg by mouth daily as needed. For migraines. Take as close to onset of migraine as possible. One daily maximum. (Patient not taking: Reported on 06/23/2022), Disp: 10 tablet, Rfl: 0  Current Facility-Administered Medications:    botulinum toxin Type A (BOTOX) injection 200 Units, 200 Units, Intramuscular, Once, York Spaniel, MD   Review of Systems:   Review of Systems  Constitutional:  Positive for chills, fever and malaise/fatigue.  HENT:  Positive for congestion.   Respiratory:  Positive for cough.   Gastrointestinal:  Positive for diarrhea, nausea and vomiting.  Neurological:  Positive for headaches.    Vitals:   Vitals:   06/23/22 1418  BP: 130/80  Pulse:  89  Temp: 97.8 F (36.6 C)  TempSrc: Temporal  SpO2: 90%  Weight: 142 lb (64.4 kg)  Height: 5\' 7"  (1.702 m)     Body mass index is 22.24 kg/m.  Physical Exam:   Physical Exam Vitals and nursing note reviewed.  Constitutional:      General: She is not in acute distress.    Appearance: She is well-developed. She is ill-appearing. She is not toxic-appearing.  HENT:     Head: Normocephalic and atraumatic.     Right Ear: Tympanic membrane, ear canal and external ear normal. Tympanic membrane is not erythematous, retracted or bulging.     Left Ear: Tympanic membrane, ear canal and external ear normal. Tympanic membrane is not erythematous, retracted or bulging.     Nose: Nose normal.     Right Sinus: No maxillary sinus tenderness or frontal sinus tenderness.     Left Sinus: No maxillary sinus tenderness or frontal sinus tenderness.     Mouth/Throat:     Pharynx: Uvula midline. No posterior oropharyngeal erythema.  Eyes:     General: Lids are normal.     Conjunctiva/sclera: Conjunctivae normal.  Neck:     Trachea: Trachea normal.  Cardiovascular:     Rate and Rhythm: Normal rate and regular rhythm.     Heart sounds: Normal heart sounds, S1 normal and S2 normal.  Pulmonary:     Effort: Pulmonary effort is normal.     Breath sounds: Decreased air movement present. Examination of the right-upper field reveals wheezing. Examination of the right-middle field reveals wheezing. Examination of the right-lower field reveals wheezing. Wheezing present. No decreased breath sounds, rhonchi or rales.  Lymphadenopathy:     Cervical: No cervical adenopathy.  Skin:    General: Skin is warm and dry.  Neurological:     Mental Status: She is alert.  Psychiatric:  Speech: Speech normal.        Behavior: Behavior normal. Behavior is cooperative.     Assessment and Plan:   Fever, unspecified fever cause; Malaise and fatigue Patient appears quite ill Concern for PNEUMONIA, COPD  exacerbation and early sepsis Appears significantly dehydrated as well Recommend ER evaluation for stat work-up Patient and husband are agreeable to plan   I,Rachel Rivera,acting as a scribe for [employer], PA.,have documented all relevant documentation on the behalf of Jarold Motto, PA,as directed by  Jarold Motto, PA while in the presence of Jarold Motto, [place].  I, Jarold Motto, [place], have reviewed all documentation for this visit. The documentation on 06/23/22 for the exam, diagnosis, procedures, and orders are all accurate and complete.   Jarold Motto, PA-C

## 2022-06-23 NOTE — ED Provider Triage Note (Signed)
Emergency Medicine Provider Triage Evaluation Note  Suzanne [name] , a 72 y.o. female  was evaluated in triage.  Pt complains of shortness of breath and cough.  Patient states that she had flulike symptoms for traveling to [place] last week in which she began having worsening symptoms including productive cough with diarrhea.  Patient states she had decreased appetite but denies any fever.  Patient that she does have a history of COPD.  Review of Systems  Positive: See HPI Negative: HPI  Physical Exam  BP (!) 126/59 (BP Location: Left Arm)   Pulse 93   Temp 99 F (37.2 C) (Oral)   Resp 16   SpO2 (!) 88%  Gen:   Awake, no distress   Resp:  Normal effort  MSK:   Moves extremities without difficulty  Other:  Lungs clear  Medical Decision Making  Medically screening exam initiated at 3:58 PM.  Appropriate orders placed.  Suzanne [name] was informed that the remainder of the evaluation will be completed by another provider, this initial triage assessment does not replace that evaluation, and the importance of remaining in the ED until their evaluation is complete.  Workup started, stable this time   [name], [place] 06/23/22 1601

## 2022-06-23 NOTE — ED Provider Notes (Signed)
[facility] Provider Note   CSN: [id number] Arrival date & time: 06/23/22  1522     History {Add pertinent medical, surgical, social history, OB history to HPI:1} Chief Complaint  Patient presents with   Weakness   Cough    Suzanne [name] is a 72 y.o. female.  Patient has a history of COPD and atrial fibs.  She is complaining of cough and sputum production.  Along with shortness of breath   Weakness Associated symptoms: cough   Cough      Home Medications Prior to Admission medications   Medication Sig Start Date End Date Taking? Authorizing Provider  acetaminophen (TYLENOL) 325 MG tablet Take 325 mg 2 (two) times daily as needed by mouth (pain).     [provider]  albuterol (VENTOLIN HFA) 108 (90 Base) MCG/ACT inhaler INHALE 2 PUFFS INTO THE LUNGS EVERY 6 HOURS AS NEEDED FOR WHEEZING OR SHORTNESS OF BREATH. 02/04/22 02/04/23  Shelva Majestic, MD  ALPRAZolam Prudy Feeler) 0.5 MG tablet Take 1 tablet (0.5 mg total) by mouth 2 (two) times daily as needed for anxiety. 03/25/22     apixaban (ELIQUIS) 5 MG TABS tablet Take 1 tablet (5 mg total) by mouth 2 (two) times daily. 12/30/21 12/30/22  Sheilah Pigeon, PA-C  buPROPion (WELLBUTRIN XL) 300 MG 24 hr tablet Take 1 tablet by mouth daily 03/25/22     butalbital-acetaminophen-caffeine (FIORICET) 50-325-40 MG tablet Take 1 tablet by mouth every 6 (six) hours as needed. For headache 05/19/22 05/19/23  Glean Salvo, NP  dorzolamide-timolol (COSOPT) 2-0.5 % ophthalmic solution PLACE 1 DROP IN BOTH EYES TWO TIMES DAILY 07/16/21   Shelva Majestic, MD  Galcanezumab-gnlm [facility]) 120 MG/ML SOAJ Inject 120 mg as directed every 30 days. 12/12/21   Anson Fret, MD  Hypromellose (ARTIFICIAL TEARS OP) Place 1 drop into both eyes daily as needed (dry eyes).     [provider]  Probiotic Product (ALIGN PO) Take 4 mg by mouth as needed.    [provider]  promethazine  (PHENERGAN) 25 MG tablet Take 1 tablet (25 mg total) by mouth every 6 (six) hours as needed for nausea or vomiting. 05/19/22   Glean Salvo, NP  Rimegepant Sulfate (NURTEC) 75 MG TBDP Take 75 mg by mouth daily as needed. For migraines. Take as close to onset of migraine as possible. One daily maximum. Patient not taking: Reported on 06/23/2022 12/12/21   Anson Fret, MD  rosuvastatin (CRESTOR) 5 MG tablet Take 1 tablet (5 mg total) by mouth once a week. 08/02/21   Shelva Majestic, MD  sertraline (ZOLOFT) 100 MG tablet Take 1 tablet (100 mg total) by mouth daily. 03/25/22     Simethicone (GAS-X PO) Take 2 tablets daily as needed by mouth (gas).    [provider]  sodium chloride (OCEAN) 0.65 % SOLN nasal spray Place 1 spray as needed into both nostrils for congestion.    [provider]  SUMAtriptan (IMITREX) 100 MG tablet TAKE 1 TABLET BY MOUTH AT THE ONSET OF A HEADACHE DAILY AS NEEDED 05/19/22   Glean Salvo, NP  timolol (TIMOPTIC) 0.5 % ophthalmic solution Place 1 drop into both eyes 2 (two) times daily. 10/20/16   [provider]  Ubrogepant (UBRELVY) 100 MG TABS Take 100 mg by mouth every 2 (two) hours as needed. Maximum 200mg  a day. 12/12/21   Anson Fret, MD      Allergies  Dilaudid [hydromorphone hcl], Fentanyl, Ciprofloxacin, Erythromycin, and Percocet [oxycodone-acetaminophen]    Review of Systems   Review of Systems  Respiratory:  Positive for cough.   Neurological:  Positive for weakness.    Physical Exam Updated Vital Signs BP 127/82   Pulse 77   Temp 98.6 F (37 C) (Oral)   Resp 15   SpO2 94%  Physical Exam  ED Results / Procedures / Treatments   Labs (all labs ordered are listed, but only abnormal results are displayed) Labs Reviewed  BASIC METABOLIC PANEL - Abnormal; Notable for the following components:      Result Value   Sodium 130 (*)    Potassium 3.2 (*)    Chloride 93 (*)    Glucose, Bld 109 (*)    Calcium 8.5 (*)     All other components within normal limits  CBC WITH DIFFERENTIAL/PLATELET - Abnormal; Notable for the following components:   WBC 15.2 (*)    RBC 3.80 (*)    Hemoglobin 11.5 (*)    HCT 35.1 (*)    Neutro Abs 12.2 (*)    Monocytes Absolute 1.5 (*)    Abs Immature Granulocytes 0.17 (*)    All other components within normal limits  HEPATIC FUNCTION PANEL - Abnormal; Notable for the following components:   Albumin 3.1 (*)    All other components within normal limits  RESP PANEL BY RT-PCR (RSV, FLU A&B, COVID)  RVPGX2  TROPONIN I (HIGH SENSITIVITY)  TROPONIN I (HIGH SENSITIVITY)    EKG None  Radiology DG Chest 2 View  Result Date: 06/23/2022 CLINICAL DATA:  Shortness of breath and productive cough. EXAM: CHEST - 2 VIEW COMPARISON:  Chest CT February 28, 2022 FINDINGS: Tortuosity and calcific atherosclerotic disease of the aorta. Cardiomediastinal silhouette is normal. Mediastinal contours appear intact. Nodularity versus airspace consolidation in the right lung base, better seen on the lateral view. Osseous structures are without acute abnormality. Soft tissues are grossly normal. IMPRESSION: Nodularity versus airspace consolidation in the right lung base. Close follow-up is recommended to assure resolution. Electronically Signed   By: Ted Mcalpine M.D.   On: 06/23/2022 17:01    Procedures Procedures  {Document cardiac monitor, telemetry assessment procedure when appropriate:1}  Medications Ordered in ED Medications  cefTRIAXone (ROCEPHIN) 2 g in sodium chloride 0.9 % 100 mL IVPB (2 g Intravenous New Bag/Given 06/23/22 1928)  azithromycin (ZITHROMAX) 500 mg in sodium chloride 0.9 % 250 mL IVPB (has no administration in time range)  methylPREDNISolone sodium succinate (SOLU-MEDROL) 125 mg/2 mL injection 125 mg (125 mg Intravenous Given 06/23/22 1717)  magnesium sulfate IVPB 2 g 50 mL (0 g Intravenous Stopped 06/23/22 1926)  ipratropium-albuterol (DUONEB) 0.5-2.5 (3) MG/3ML  nebulizer solution 3 mL (3 mLs Nebulization Given 06/23/22 1723)  albuterol (PROVENTIL) (2.5 MG/3ML) 0.083% nebulizer solution 2.5 mg (2.5 mg Nebulization Given 06/23/22 1723)  sodium chloride 0.9 % bolus 500 mL (0 mLs Intravenous Stopped 06/23/22 1927)    ED Course/ Medical Decision Making/ A&P   {   Click here for ABCD2, HEART and other calculatorsREFRESH Note before signing :1}                          Medical Decision Making Amount and/or Complexity of Data Reviewed Labs: ordered.  Risk Prescription drug management. Decision regarding hospitalization.   Patient with hypoxia and pneumonia.  She will be admitted to medicine  {Document critical care time when appropriate:1} {Document review of labs  and clinical decision tools ie heart score, Chads2Vasc2 etc:1}  {Document your independent review of radiology images, and any outside records:1} {Document your discussion with family members, caretakers, and with consultants:1} {Document social determinants of health affecting pt's care:1} {Document your decision making why or why not admission, treatments were needed:1} Final Clinical Impression(s) / ED Diagnoses Final diagnoses:  Community acquired pneumonia, unspecified laterality    Rx / DC Orders ED Discharge Orders     None

## 2022-06-23 NOTE — ED Triage Notes (Signed)
Exertional dyspnea. 

## 2022-06-24 ENCOUNTER — Other Ambulatory Visit (HOSPITAL_COMMUNITY): Payer: Self-pay

## 2022-06-24 DIAGNOSIS — J189 Pneumonia, unspecified organism: Secondary | ICD-10-CM | POA: Diagnosis not present

## 2022-06-24 DIAGNOSIS — F331 Major depressive disorder, recurrent, moderate: Secondary | ICD-10-CM | POA: Diagnosis not present

## 2022-06-24 DIAGNOSIS — F411 Generalized anxiety disorder: Secondary | ICD-10-CM | POA: Diagnosis not present

## 2022-06-24 LAB — COMPREHENSIVE METABOLIC PANEL
ALT: 16 U/L (ref 0–44)
AST: 14 U/L — ABNORMAL LOW (ref 15–41)
Albumin: 2.7 g/dL — ABNORMAL LOW (ref 3.5–5.0)
Alkaline Phosphatase: 69 U/L (ref 38–126)
Anion gap: 10 (ref 5–15)
BUN: 7 mg/dL — ABNORMAL LOW (ref 8–23)
CO2: 27 mmol/L (ref 22–32)
Calcium: 8.1 mg/dL — ABNORMAL LOW (ref 8.9–10.3)
Chloride: 96 mmol/L — ABNORMAL LOW (ref 98–111)
Creatinine, Ser: 0.5 mg/dL (ref 0.44–1.00)
GFR, Estimated: >60 mL/min (ref >60)
Glucose, Bld: 148 mg/dL — ABNORMAL HIGH (ref 70–99)
Potassium: 3.6 mmol/L (ref 3.5–5.1)
Sodium: 133 mmol/L — ABNORMAL LOW (ref 135–145)
Total Bilirubin: 0.4 mg/dL (ref 0.3–1.2)
Total Protein: 6.1 g/dL — ABNORMAL LOW (ref 6.5–8.1)

## 2022-06-24 LAB — CULTURE, RESPIRATORY W GRAM STAIN

## 2022-06-24 LAB — CBC
HCT: 33.1 % — ABNORMAL LOW (ref 36.0–46.0)
Hemoglobin: 10.9 g/dL — ABNORMAL LOW (ref 12.0–15.0)
MCH: 31.1 pg (ref 26.0–34.0)
MCHC: 32.9 g/dL (ref 30.0–36.0)
MCV: 94.6 fL (ref 80.0–100.0)
Platelets: 317 10*3/uL (ref 150–400)
RBC: 3.5 MIL/uL — ABNORMAL LOW (ref 3.87–5.11)
RDW: 12.6 % (ref 11.5–15.5)
WBC: 13.9 10*3/uL — ABNORMAL HIGH (ref 4.0–10.5)
nRBC: 0 % (ref 0.0–0.2)

## 2022-06-24 MED ORDER — BUPROPION HCL ER (XL) 300 MG PO TB24
300.0000 mg | ORAL_TABLET | Freq: Every day | ORAL | 1 refills | Status: DC
  Filled 2022-06-24: qty 90, 90d supply, fill #0

## 2022-06-24 MED ORDER — ORAL CARE MOUTH RINSE: 15.0000 mL | OROMUCOSAL | Status: DC | PRN

## 2022-06-24 MED ORDER — GUAIFENESIN ER 600 MG PO TB12
600.0000 mg | ORAL_TABLET | Freq: Once | ORAL | Status: AC
  Administered 2022-06-24: 600 mg via ORAL

## 2022-06-24 MED ORDER — SERTRALINE HCL 100 MG PO TABS: 100.0000 mg | ORAL_TABLET | Freq: Every day | ORAL | 1 refills | Status: DC

## 2022-06-24 MED ORDER — ALPRAZOLAM 0.5 MG PO TABS
0.5000 mg | ORAL_TABLET | Freq: Every day | ORAL | 2 refills | Status: DC | PRN
  Filled 2022-06-24: qty 60, 30d supply, fill #0

## 2022-06-24 MED ORDER — GUAIFENESIN ER 600 MG PO TB12
1200.0000 mg | ORAL_TABLET | Freq: Two times a day (BID) | ORAL | Status: DC
  Administered 2022-06-24 – 2022-06-26 (×4): 1200 mg via ORAL
  Filled 2022-06-24 (×4): qty 2

## 2022-06-24 MED ORDER — IPRATROPIUM-ALBUTEROL 0.5-2.5 (3) MG/3ML IN SOLN
3.0000 mL | Freq: Three times a day (TID) | RESPIRATORY_TRACT | Status: DC
  Administered 2022-06-24 – 2022-06-26 (×6): 3 mL via RESPIRATORY_TRACT
  Filled 2022-06-24 (×5): qty 3

## 2022-06-24 NOTE — Progress Notes (Signed)
  Transition of Care [facility]) Screening Note   Patient Details  Name: Suzanne [name] Date of Birth: 02/19/1950   Transition of Care [facility]) CM/SW Contact:    Lanier Clam, RN Phone Number: 06/24/2022, 1:58 PM    Transition of Care Department [facility]) has reviewed patient and no TOC needs have been identified at this time. We will continue to monitor patient advancement through interdisciplinary progression rounds. If new patient transition needs arise, please place a TOC consult.

## 2022-06-24 NOTE — Progress Notes (Signed)
PROGRESS NOTE    Suzanne [name]  NWG:[id number] DOB: [birth year] DOA: 06/23/2022 PCP: Shelva Majestic, MD   Brief Narrative: 72 year old with past medical history significant for PAF on Eliquis, COPD, tobacco use, anxiety disorder presents to the ED complaining of weakness and cough that is started on May 10 when she was on her way to [place].  She had a COVID test done at home prior to her travel and it was negative.  She continued to feel poorly during her trip continue to have cough and worsening shortness of breath.  She returned back the evening prior to admission, presented to the ED for further evaluation.  She was found to have right lower lobe pneumonia and COPD exacerbation.  She was admitted for further care. She was found to be hypoxic on initial presentation to the ED with oxygen saturation 80% on room air.   Assessment & Plan:   Principal Problem:   PNA (pneumonia) Active Problems:   COPD (chronic obstructive pulmonary disease) (HCC)   Tobacco abuse   Intractable chronic migraine without aura   Anxiety state  1-Acute hypoxic respiratory failure in the setting of acute COPD exacerbation and right lower lobe pneumonia: -Patient presents with fever, persistent cough, chest x-ray finding consistent for pneumonia. -Influenza A and B and RSV negative.  COVID PCR negative. -White blood cell has decreased from 15---13, she has remained afebrile today. -Sputum culture growing gram-negative rods, she has responded to IV ceftriaxone with white blood cell trending down and no fevers today.  -Continue with IV ceftriaxone and doxycycline -Continue with the scheduled DuoNebs and IV Solu-Medrol -Flutter Valve.   2-PAF: -Currently sinus rhythm.  -Last cardioversion 2016 -Continue with Eliquis  3-Hyperlipidemia; continue with Crestor.   Migraines headache: Continue with Imitrex as needed. Phenergan/Benadryl for severe intractable headaches not relieved by Imitrex Patient is  also on maintenance Emgality as an outpatient. continue as needed Xanax along with Wellbutrin/Zoloft   Tobacco abuse: Counseling provided Hyponatremia; encourage oral intake.   Estimated body mass index is 22.24 kg/m as calculated from the following:   Height as of this encounter: 5\' 7"  (1.702 m).   Weight as of this encounter: 64.4 kg.   DVT prophylaxis: Eliquis Code Status: Full code Family Communication: family at bedside.  Disposition Plan:  Status is: Inpatient Remains inpatient appropriate because: management of PNA    Consultants:  None  Procedures:  none  Antimicrobials:    Subjective: She is feeling better than yesterday., breathing better report cough, thick Phlegm.   Objective: Vitals:   06/24/22 0124 06/24/22 0548 06/24/22 0643 06/24/22 0741  BP: 123/69 128/77    Pulse: 74 74    Resp: 20 20    Temp: 98.1 F (36.7 C) 98.4 F (36.9 C)    TempSrc: Oral     SpO2: 97% 96%  95%  Weight:   64.4 kg   Height:   5\' 7"  (1.702 m)     Intake/Output Summary (Last 24 hours) at 06/24/2022 1034 Last data filed at 06/24/2022 0900 Gross per 24 hour  Intake 460 ml  Output 900 ml  Net -440 ml   Filed Weights   06/24/22 0643  Weight: 64.4 kg    Examination:  General exam: Appears calm and comfortable  Respiratory system: Clear to auscultation. Respiratory effort normal. Cardiovascular system: S1 & S2 heard, RRR. Gastrointestinal system: Abdomen is nondistended, soft and nontender. No organomegaly or masses felt. Normal bowel sounds heard. Central nervous system: Alert and oriented. Trace  edema LE Extremities: Symmetric 5 x 5 power.    Data Reviewed: I have personally reviewed following labs and imaging studies  CBC: Recent Labs  Lab 06/23/22 1713 06/24/22 0513  WBC 15.2* 13.9*  NEUTROABS 12.2*  --   HGB 11.5* 10.9*  HCT 35.1* 33.1*  MCV 92.4 94.6  PLT 335 317   Basic Metabolic Panel: Recent Labs  Lab 06/23/22 1713 06/24/22 0513  NA 130*  133*  K 3.2* 3.6  CL 93* 96*  CO2 26 27  GLUCOSE 109* 148*  BUN 8 7*  CREATININE 0.65 0.50  CALCIUM 8.5* 8.1*   GFR: Estimated Creatinine Clearance: 60.9 mL/min (by C-G formula based on SCr of 0.5 mg/dL). Liver Function Tests: Recent Labs  Lab 06/23/22 1713 06/24/22 0513  AST 18 14*  ALT 16 16  ALKPHOS 75 69  BILITOT 0.5 0.4  PROT 6.8 6.1*  ALBUMIN 3.1* 2.7*   No results for input(s): "LIPASE", "AMYLASE" in the last 168 hours. No results for input(s): "AMMONIA" in the last 168 hours. Coagulation Profile: No results for input(s): "INR", "PROTIME" in the last 168 hours. Cardiac Enzymes: No results for input(s): "CKTOTAL", "CKMB", "CKMBINDEX", "TROPONINI" in the last 168 hours. BNP (last 3 results) No results for input(s): "PROBNP" in the last 8760 hours. HbA1C: No results for input(s): "HGBA1C" in the last 72 hours. CBG: No results for input(s): "GLUCAP" in the last 168 hours. Lipid Profile: No results for input(s): "CHOL", "HDL", "LDLCALC", "TRIG", "CHOLHDL", "LDLDIRECT" in the last 72 hours. Thyroid Function Tests: No results for input(s): "TSH", "T4TOTAL", "FREET4", "T3FREE", "THYROIDAB" in the last 72 hours. Anemia Panel: No results for input(s): "VITAMINB12", "FOLATE", "FERRITIN", "TIBC", "IRON", "RETICCTPCT" in the last 72 hours. Sepsis Labs: No results for input(s): "PROCALCITON", "LATICACIDVEN" in the last 168 hours.  Recent Results (from the past 240 hour(s))  Resp panel by RT-PCR (RSV, Flu A&B, Covid) Anterior Nasal Swab     Status: None   Collection Time: 06/23/22  5:13 PM   Specimen: Anterior Nasal Swab  Result Value Ref Range Status   SARS Coronavirus 2 by RT PCR NEGATIVE NEGATIVE Final    Comment: (NOTE) SARS-CoV-2 target nucleic acids are NOT DETECTED.  The SARS-CoV-2 RNA is generally detectable in upper respiratory specimens during the acute phase of infection. The lowest concentration of SARS-CoV-2 viral copies this assay can detect is 138  copies/mL. A negative result does not preclude SARS-Cov-2 infection and should not be used as the sole basis for treatment or other patient management decisions. A negative result may occur with  improper specimen collection/handling, submission of specimen other than nasopharyngeal swab, presence of viral mutation(s) within the areas targeted by this assay, and inadequate number of viral copies(<138 copies/mL). A negative result must be combined with clinical observations, patient history, and epidemiological information. The expected result is Negative.  Fact Sheet for Patients:  BloggerCourse.com  Fact Sheet for Healthcare Providers:  SeriousBroker.it  This test is no t yet approved or cleared by the [place] FDA and  has been authorized for detection and/or diagnosis of SARS-CoV-2 by FDA under an Emergency Use Authorization (EUA). This EUA will remain  in effect (meaning this test can be used) for the duration of the COVID-19 declaration under Section 564(b)(1) of the Act, 21 U.S.C.section 360bbb-3(b)(1), unless the authorization is terminated  or revoked sooner.       Influenza A by PCR NEGATIVE NEGATIVE Final   Influenza B by PCR NEGATIVE NEGATIVE Final    Comment: (NOTE)  The Xpert Xpress SARS-CoV-2/FLU/RSV plus assay is intended as an aid in the diagnosis of influenza from Nasopharyngeal swab specimens and should not be used as a sole basis for treatment. Nasal washings and aspirates are unacceptable for Xpert Xpress SARS-CoV-2/FLU/RSV testing.  Fact Sheet for Patients: BloggerCourse.com  Fact Sheet for Healthcare Providers: SeriousBroker.it  This test is not yet approved or cleared by the [place] FDA and has been authorized for detection and/or diagnosis of SARS-CoV-2 by FDA under an Emergency Use Authorization (EUA). This EUA will remain in effect (meaning  this test can be used) for the duration of the COVID-19 declaration under Section 564(b)(1) of the Act, 21 U.S.C. section 360bbb-3(b)(1), unless the authorization is terminated or revoked.     Resp Syncytial Virus by PCR NEGATIVE NEGATIVE Final    Comment: (NOTE) Fact Sheet for Patients: BloggerCourse.com  Fact Sheet for Healthcare Providers: SeriousBroker.it  This test is not yet approved or cleared by the [place] FDA and has been authorized for detection and/or diagnosis of SARS-CoV-2 by FDA under an Emergency Use Authorization (EUA). This EUA will remain in effect (meaning this test can be used) for the duration of the COVID-19 declaration under Section 564(b)(1) of the Act, 21 U.S.C. section 360bbb-3(b)(1), unless the authorization is terminated or revoked.  Performed at [facility], [address], [place], [place] [address]   Expectorated Sputum Assessment w Gram Stain, Rflx to Resp Cult     Status: None   Collection Time: 06/23/22  9:36 PM   Specimen: Expectorated Sputum  Result Value Ref Range Status   Specimen Description EXPECTORATED SPUTUM  Final   Special Requests NONE  Final   Sputum evaluation   Final    THIS SPECIMEN IS ACCEPTABLE FOR SPUTUM CULTURE Performed at [facility], [address] [place], [place] [address]    Report Status 06/23/2022 FINAL  Final  Culture, Respiratory w Gram Stain     Status: None (Preliminary result)   Collection Time: 06/23/22  9:36 PM  Result Value Ref Range Status   Specimen Description   Final    EXPECTORATED SPUTUM Performed at [facility], [address], [place], [place] [address]    Special Requests   Final    NONE Reflexed from [id number] Performed at [facility], [address] [place], [place] [address]    Gram Stain   Final    FEW WBC PRESENT,BOTH PMN AND MONONUCLEAR FEW GRAM NEGATIVE  RODS Performed at [facility], [address] [place], [place] [address]    Culture PENDING  Incomplete   Report Status PENDING  Incomplete         Radiology Studies: DG Chest 2 View  Result Date: 06/23/2022 CLINICAL DATA:  Shortness of breath and productive cough. EXAM: CHEST - 2 VIEW COMPARISON:  Chest CT February 28, 2022 FINDINGS: Tortuosity and calcific atherosclerotic disease of the aorta. Cardiomediastinal silhouette is normal. Mediastinal contours appear intact. Nodularity versus airspace consolidation in the right lung base, better seen on the lateral view. Osseous structures are without acute abnormality. Soft tissues are grossly normal. IMPRESSION: Nodularity versus airspace consolidation in the right lung base. Close follow-up is recommended to assure resolution. Electronically Signed   By: Ted Mcalpine M.D.   On: 06/23/2022 17:01        Scheduled Meds:  apixaban  5 mg Oral BID   buPROPion  300 mg Oral Daily   doxycycline  100 mg Oral  Q12H   guaiFENesin  600 mg Oral BID   ipratropium-albuterol  3 mL Nebulization TID   methylPREDNISolone (SOLU-MEDROL) injection  40 mg Intravenous Q12H   rosuvastatin  5 mg Oral Weekly   sertraline  100 mg Oral Daily   Continuous Infusions:  cefTRIAXone (ROCEPHIN)  IV     promethazine (PHENERGAN) injection (IM or IVPB)       LOS: 1 day    Time spent: 35 minutes    Alpa Salvo A Ladell Lea, MD [facility]   If 7PM-7AM, please contact night-coverage [email or web address]  06/24/2022, 10:34 AM

## 2022-06-25 ENCOUNTER — Encounter: Payer: Self-pay | Admitting: Physician Assistant

## 2022-06-25 ENCOUNTER — Other Ambulatory Visit (HOSPITAL_COMMUNITY): Payer: Self-pay

## 2022-06-25 DIAGNOSIS — J189 Pneumonia, unspecified organism: Secondary | ICD-10-CM

## 2022-06-25 DIAGNOSIS — I480 Paroxysmal atrial fibrillation: Secondary | ICD-10-CM

## 2022-06-25 DIAGNOSIS — J9601 Acute respiratory failure with hypoxia: Secondary | ICD-10-CM | POA: Diagnosis not present

## 2022-06-25 LAB — RESPIRATORY PANEL BY PCR

## 2022-06-25 LAB — BASIC METABOLIC PANEL
Anion gap: 4 — ABNORMAL LOW (ref 5–15)
BUN: 13 mg/dL (ref 8–23)
CO2: 27 mmol/L (ref 22–32)
Calcium: 8.4 mg/dL — ABNORMAL LOW (ref 8.9–10.3)
Chloride: 101 mmol/L (ref 98–111)
Creatinine, Ser: 0.65 mg/dL (ref 0.44–1.00)
GFR, Estimated: >60 mL/min (ref >60)
Glucose, Bld: 147 mg/dL — ABNORMAL HIGH (ref 70–99)
Potassium: 4.3 mmol/L (ref 3.5–5.1)
Sodium: 132 mmol/L — ABNORMAL LOW (ref 135–145)

## 2022-06-25 LAB — CBC
HCT: 33.7 % — ABNORMAL LOW (ref 36.0–46.0)
Hemoglobin: 10.7 g/dL — ABNORMAL LOW (ref 12.0–15.0)
MCH: 30.4 pg (ref 26.0–34.0)
MCHC: 31.8 g/dL (ref 30.0–36.0)
MCV: 95.7 fL (ref 80.0–100.0)
Platelets: 341 10*3/uL (ref 150–400)
RBC: 3.52 MIL/uL — ABNORMAL LOW (ref 3.87–5.11)
RDW: 12.8 % (ref 11.5–15.5)
WBC: 17.4 10*3/uL — ABNORMAL HIGH (ref 4.0–10.5)
nRBC: 0 % (ref 0.0–0.2)

## 2022-06-25 LAB — CULTURE, RESPIRATORY W GRAM STAIN

## 2022-06-25 MED ORDER — HYDROCOD POLI-CHLORPHE POLI ER 10-8 MG/5ML PO SUER
5.0000 mL | Freq: Two times a day (BID) | ORAL | Status: DC | PRN
  Administered 2022-06-25 – 2022-06-26 (×2): 5 mL via ORAL
  Filled 2022-06-25 (×2): qty 5

## 2022-06-25 MED ORDER — DEXTROMETHORPHAN POLISTIREX ER 30 MG/5ML PO SUER
30.0000 mg | Freq: Two times a day (BID) | ORAL | Status: DC | PRN
  Filled 2022-06-25: qty 5

## 2022-06-25 MED ORDER — APIXABAN 5 MG PO TABS
5.0000 mg | ORAL_TABLET | Freq: Two times a day (BID) | ORAL | 1 refills | Status: DC
Start: 2022-06-25 — End: 2022-11-27
  Filled 2022-06-25: qty 180, 90d supply, fill #0
  Filled 2022-09-30: qty 180, 90d supply, fill #1

## 2022-06-25 NOTE — Telephone Encounter (Signed)
Pt last saw Francis Dowse, [place] on 12/02/21, last labs 06/25/22 Creat 0.65, age 72, weight 64.4kg, based on specified criteria pt is on appropriate dosage of Eliquis 5mg  BID for afib.  Will refill rx.

## 2022-06-25 NOTE — Hospital Course (Signed)
Suzanne [name] is a 72 yo female with PMH PAF on Eliquis, COPD, tobacco use, anxiety disorder who presented with weakness and a persistent cough.  She had symptoms developing prior to a trip to [place] and upon returning continued to have persistent symptoms.  She therefore presented to the ER for further evaluation. CXR was notable for right lower lobe airspace consolidation concerning for pneumonia.  Sputum culture was obtained on admission.  She was started on antibiotics. Flu, COVID, RSV were negative.  RVP panel also performed and negative.  She was noted to be hypoxic in the ER with SpO2 in the 80s and was started on oxygen.  This was able to be slowly weaned off.

## 2022-06-25 NOTE — Progress Notes (Signed)
Progress Note    Suzanne [name]   ZOX:[id number]  DOB: Jun 06, 1950  DOA: 06/23/2022     2 PCP: Shelva Majestic, MD  Initial CC: cough, weakness   Hospital Course: Suzanne [name] is a 72 yo female with PMH PAF on Eliquis, COPD, tobacco use, anxiety disorder who presented with weakness and a persistent cough.  She had symptoms developing prior to a trip to [place] and upon returning continued to have persistent symptoms.  She therefore presented to the ER for further evaluation. CXR was notable for right lower lobe airspace consolidation concerning for pneumonia.  Sputum culture was obtained on admission.  She was started on antibiotics. Flu, COVID, RSV were negative.  RVP panel also performed and negative.  She was noted to be hypoxic in the ER with SpO2 in the 80s and was started on oxygen.  This was able to be slowly weaned off.  Interval History:  No events overnight.  Husband present bedside this morning.  She was able to wean off of oxygen and ambulated without desaturation.  Assessment and Plan:  CAP Acute hypoxic respiratory failure - negative flu, covid, RSV, and RVP panel - sputum growing GNR at this time - continue rocephin and doxy; may need to complete empiric course regardless - given symptoms prior to [place] trip and weaning off O2 this morning can hold off on CTA chest but low threshold given underwhelming CXR findings for PNA - continue ambulation; on RA as of 5/22 in the morning  - continue solu-medrol and complete course with prednisone at discharge    PAF -Currently sinus rhythm.  -Last cardioversion 2016 -Continue with Eliquis   Hyperlipidemia; continue with Crestor   Migraines headache: Continue with Imitrex as needed. Phenergan/Benadryl for severe intractable headaches not relieved by Imitrex Patient is also on maintenance Emgality as an outpatient. continue as needed Xanax along with Wellbutrin/Zoloft    Tobacco abuse: Counseling  provided Hyponatremia encourage oral intake.    Old records reviewed in assessment of this patient  Antimicrobials: Azithromycin 06/23/2022 x 1 Doxycycline 06/23/2022 >> current Rocephin 06/23/2022 >> current  DVT prophylaxis:   apixaban (ELIQUIS) tablet 5 mg   Code Status:   Code Status: Full Code  Mobility Assessment (last 72 hours)     Mobility Assessment     Row Name 06/25/22 8119 06/24/22 2018 06/24/22 0900 06/23/22 2300     Does patient have an order for bedrest or is patient medically unstable No - Continue assessment No - Continue assessment No - Continue assessment No - Continue assessment    What is the highest level of mobility based on the progressive mobility assessment? Level 5 (Walks with assist in room/hall) - Balance while stepping forward/back and can walk in room with assist - Complete Level 5 (Walks with assist in room/hall) - Balance while stepping forward/back and can walk in room with assist - Complete Level 5 (Walks with assist in room/hall) - Balance while stepping forward/back and can walk in room with assist - Complete Level 5 (Walks with assist in room/hall) - Balance while stepping forward/back and can walk in room with assist - Complete             Barriers to discharge: none Disposition Plan:  Home Status is: Inpt  Objective: Blood pressure 128/72, pulse 61, temperature 98.2 F (36.8 C), temperature source Oral, resp. rate 16, height 5\' 7"  (1.702 m), weight 64.4 kg, SpO2 97 %.  Examination:  Physical Exam Constitutional:  Appearance: Normal appearance.  HENT:     Head: Normocephalic and atraumatic.     Mouth/Throat:     Mouth: Mucous membranes are moist.  Eyes:     Extraocular Movements: Extraocular movements intact.  Cardiovascular:     Rate and Rhythm: Normal rate.  Pulmonary:     Effort: Pulmonary effort is normal.     Breath sounds: Examination of the right-lower field reveals rhonchi. Rhonchi (mild) present. No wheezing.   Abdominal:     General: Bowel sounds are normal. There is no distension.     Palpations: Abdomen is soft.     Tenderness: There is no abdominal tenderness.  Musculoskeletal:        General: No swelling. Normal range of motion.     Cervical back: Normal range of motion and neck supple.  Skin:    General: Skin is warm and dry.  Neurological:     General: No focal deficit present.     Mental Status: She is alert.  Psychiatric:        Mood and Affect: Mood normal.        Behavior: Behavior normal.      Consultants:    Procedures:    Data Reviewed: Results for orders placed or performed during the hospital encounter of 06/23/22 (from the past 24 hour(s))  CBC     Status: Abnormal   Collection Time: 06/25/22  5:25 AM  Result Value Ref Range   WBC 17.4 (H) 4.0 - 10.5 K/uL   RBC 3.52 (L) 3.87 - 5.11 MIL/uL   Hemoglobin 10.7 (L) 12.0 - 15.0 g/dL   HCT 91.4 (L) 78.2 - 95.6 %   MCV 95.7 80.0 - 100.0 fL   MCH 30.4 26.0 - 34.0 pg   MCHC 31.8 30.0 - 36.0 g/dL   RDW 21.3 08.6 - 57.8 %   Platelets 341 150 - 400 K/uL   nRBC 0.0 0.0 - 0.2 %  Basic metabolic panel     Status: Abnormal   Collection Time: 06/25/22  5:25 AM  Result Value Ref Range   Sodium 132 (L) 135 - 145 mmol/L   Potassium 4.3 3.5 - 5.1 mmol/L   Chloride 101 98 - 111 mmol/L   CO2 27 22 - 32 mmol/L   Glucose, Bld 147 (H) 70 - 99 mg/dL   BUN 13 8 - 23 mg/dL   Creatinine, Ser 4.69 0.44 - 1.00 mg/dL   Calcium 8.4 (L) 8.9 - 10.3 mg/dL   GFR, Estimated >62 >95 mL/min   Anion gap 4 (L) 5 - 15    I have reviewed pertinent nursing notes, vitals, labs, and images as necessary. I have ordered labwork to follow up on as indicated.  I have reviewed the last notes from staff over past 24 hours. I have discussed patient's care plan and test results with nursing staff, CM/SW, and other staff as appropriate.  Time spent: Greater than 50% of the 55 minute visit was spent in counseling/coordination of care for the patient as  laid out in the A&P.   LOS: 2 days   Lewie Chamber, MD [facility] 06/25/2022, 4:59 PM

## 2022-06-26 ENCOUNTER — Other Ambulatory Visit (HOSPITAL_COMMUNITY): Payer: Self-pay

## 2022-06-26 DIAGNOSIS — J9601 Acute respiratory failure with hypoxia: Secondary | ICD-10-CM | POA: Diagnosis not present

## 2022-06-26 DIAGNOSIS — J189 Pneumonia, unspecified organism: Secondary | ICD-10-CM | POA: Diagnosis not present

## 2022-06-26 LAB — CBC WITH DIFFERENTIAL/PLATELET
Abs Immature Granulocytes: 0.24 10*3/uL — ABNORMAL HIGH (ref 0.00–0.07)
Basophils Absolute: 0 10*3/uL (ref 0.0–0.1)
Basophils Relative: 0 %
Eosinophils Absolute: 0 10*3/uL (ref 0.0–0.5)
Eosinophils Relative: 0 %
HCT: 34.7 % — ABNORMAL LOW (ref 36.0–46.0)
Hemoglobin: 10.9 g/dL — ABNORMAL LOW (ref 12.0–15.0)
Immature Granulocytes: 2 %
Lymphocytes Relative: 10 %
Lymphs Abs: 1.6 10*3/uL (ref 0.7–4.0)
MCH: 30.3 pg (ref 26.0–34.0)
MCHC: 31.4 g/dL (ref 30.0–36.0)
MCV: 96.4 fL (ref 80.0–100.0)
Monocytes Absolute: 0.6 10*3/uL (ref 0.1–1.0)
Monocytes Relative: 4 %
Neutro Abs: 13.5 10*3/uL — ABNORMAL HIGH (ref 1.7–7.7)
Neutrophils Relative %: 84 %
Platelets: 371 10*3/uL (ref 150–400)
RBC: 3.6 MIL/uL — ABNORMAL LOW (ref 3.87–5.11)
RDW: 12.9 % (ref 11.5–15.5)
WBC: 15.9 10*3/uL — ABNORMAL HIGH (ref 4.0–10.5)
nRBC: 0 % (ref 0.0–0.2)

## 2022-06-26 LAB — BASIC METABOLIC PANEL
Anion gap: 8 (ref 5–15)
BUN: 20 mg/dL (ref 8–23)
CO2: 26 mmol/L (ref 22–32)
Calcium: 8.6 mg/dL — ABNORMAL LOW (ref 8.9–10.3)
Chloride: 99 mmol/L (ref 98–111)
Creatinine, Ser: 0.62 mg/dL (ref 0.44–1.00)
GFR, Estimated: >60 mL/min (ref >60)
Glucose, Bld: 120 mg/dL — ABNORMAL HIGH (ref 70–99)
Potassium: 4.2 mmol/L (ref 3.5–5.1)
Sodium: 133 mmol/L — ABNORMAL LOW (ref 135–145)

## 2022-06-26 LAB — CULTURE, RESPIRATORY W GRAM STAIN

## 2022-06-26 LAB — MAGNESIUM: Magnesium: 2.3 mg/dL (ref 1.7–2.4)

## 2022-06-26 MED ORDER — AMOXICILLIN 875 MG PO TABS
875.0000 mg | ORAL_TABLET | Freq: Two times a day (BID) | ORAL | 0 refills | Status: AC
  Filled 2022-06-26: qty 8, 4d supply, fill #0

## 2022-06-26 MED ORDER — PREDNISONE 20 MG PO TABS
40.0000 mg | ORAL_TABLET | Freq: Every day | ORAL | 0 refills | Status: AC
  Filled 2022-06-26: qty 8, 4d supply, fill #0

## 2022-06-26 MED ORDER — DOXYCYCLINE HYCLATE 100 MG PO CAPS
100.0000 mg | ORAL_CAPSULE | Freq: Two times a day (BID) | ORAL | 0 refills | Status: AC
  Filled 2022-06-26: qty 8, 4d supply, fill #0

## 2022-06-26 MED ORDER — HYDROCOD POLI-CHLORPHE POLI ER 10-8 MG/5ML PO SUER
5.0000 mL | Freq: Two times a day (BID) | ORAL | 0 refills | Status: DC | PRN
  Filled 2022-06-26: qty 50, 5d supply, fill #0
  Filled 2022-06-26: qty 20, 2d supply, fill #0

## 2022-06-26 NOTE — Discharge Summary (Signed)
Physician Discharge Summary   Suzanne [name] ZOX:[id number] DOB: Dec 08, 1950 DOA: 06/23/2022  PCP: Shelva Majestic, MD  Admit date: 06/23/2022 Discharge date: 06/26/2022   Admitted From: Home Disposition:  Home Discharging physician: Lewie Chamber, MD Barriers to discharge: none  Recommendations at discharge: Continue chronic medical management Encourage smoking cessation   Discharge Condition: stable CODE STATUS: Full Diet recommendation:  Diet Orders (From admission, onward)     Start     Ordered   06/26/22 0000  Diet - low sodium heart healthy        06/26/22 1209   06/23/22 2006  Diet Heart Room service appropriate? Yes; Fluid consistency: Thin  Diet effective now       Question Answer Comment  Room service appropriate? Yes   Fluid consistency: Thin      06/23/22 2007            Hospital Course: [name] is a 72 yo female with PMH PAF on Eliquis, COPD, tobacco use, anxiety disorder who presented with weakness and a persistent cough.  She had symptoms developing prior to a trip to [place] and upon returning continued to have persistent symptoms.  She therefore presented to the ER for further evaluation. CXR was notable for right lower lobe airspace consolidation concerning for pneumonia.  Sputum culture was obtained on admission.  She was started on antibiotics. Flu, COVID, RSV were negative.  RVP panel also performed and negative.  She was noted to be hypoxic in the ER with SpO2 in the 80s and was started on oxygen.  This was able to be slowly weaned off.  She ambulated with no desaturation as well to warrant home oxygen. She was continued on antibiotics and steroids to complete empiric course at discharge.  Assessment and Plan:  CAP Acute hypoxic respiratory failure - resolved  - negative flu, covid, RSV, and RVP panel - s/p rocephin and doxy inpatient; continued on amoxicillin and doxycycline at discharge to complete course -Prednisone also continued  to complete short course at discharge -Patient ambulated on room air with no significant desaturation to warrant needing home oxygen   PAF -remained NSR -Last cardioversion 2016 -Continue with Eliquis   Hyperlipidemia; continue with Crestor   Migraines headache: Continue with Imitrex as needed. Phenergan/Benadryl for severe intractable headaches not relieved by Imitrex Patient is also on maintenance Emgality as an outpatient. continue as needed Xanax along with Wellbutrin/Zoloft    Tobacco abuse: Counseling provided Hyponatremia encourage oral intake.    The patient's chronic medical conditions were treated accordingly per the patient's home medication regimen except as noted.  On day of discharge, patient was felt deemed stable for discharge. Patient/family member advised to call PCP or come back to ER if needed.   Principal Diagnosis: CAP (community acquired pneumonia)  Discharge Diagnoses: Active Hospital Problems   Diagnosis Date Noted   CAP (community acquired pneumonia) 06/23/2022    Priority: 1.   COPD (chronic obstructive pulmonary disease) (HCC) 02/01/2009    Priority: 3.   Anxiety state 12/14/2018   Intractable chronic migraine without aura 05/17/2014   Tobacco abuse 10/03/2013   S/P ablation of atrial fibrillation 05/16/2013    Resolved Hospital Problems   Diagnosis Date Noted Date Resolved   Acute respiratory failure with hypoxia [facility]) 06/25/2022 06/25/2022    Priority: 2.     Discharge Instructions     Diet - low sodium heart healthy   Complete by: As directed    Increase activity slowly   Complete  by: As directed       Allergies as of 06/26/2022       Reactions   Dilaudid [hydromorphone Hcl] Nausea And Vomiting   Fentanyl Nausea And Vomiting, Other (See Comments), Nausea Only   Ciprofloxacin Rash   Erythromycin Rash   Percocet [oxycodone-acetaminophen] Nausea And Vomiting        Medication List     STOP taking these medications    Nurtec  75 MG Tbdp Generic drug: Rimegepant Sulfate       TAKE these medications    acetaminophen 325 MG tablet Commonly known as: TYLENOL Take 325 mg 2 (two) times daily as needed by mouth (pain).   albuterol 108 (90 Base) MCG/ACT inhaler Commonly known as: VENTOLIN HFA INHALE 2 PUFFS INTO THE LUNGS EVERY 6 HOURS AS NEEDED FOR WHEEZING OR SHORTNESS OF BREATH.   Align 4 MG Caps Take 4 mg by mouth See admin instructions. Take 4 mg by mouth once a day when taking antibiotics   ALPRAZolam 0.5 MG tablet Commonly known as: XANAX Take 1 tablet (0.5 mg total) by mouth 2 (two) times daily as needed for anxiety. What changed: Another medication with the same name was added. Make sure you understand how and when to take each.   ALPRAZolam 0.5 MG tablet Commonly known as: XANAX Take 1 tablet (0.5 mg) by mouth twice daily as needed. What changed: You were already taking a medication with the same name, and this prescription was added. Make sure you understand how and when to take each.   amoxicillin 875 MG tablet Commonly known as: AMOXIL Take 1 tablet (875 mg total) by mouth 2 (two) times daily for 4 days.   buPROPion 300 MG 24 hr tablet Commonly known as: WELLBUTRIN XL Take 1 tablet by mouth daily What changed: when to take this   butalbital-acetaminophen-caffeine 50-325-40 MG tablet Commonly known as: FIORICET Take 1 tablet by mouth every 6 (six) hours as needed. For headache What changed: reasons to take this   chlorpheniramine-HYDROcodone 10-8 MG/5ML Commonly known as: TUSSIONEX Take 5 mLs by mouth every 12 (twelve) hours as needed for cough.   dorzolamide-timolol 2-0.5 % ophthalmic solution Commonly known as: COSOPT PLACE 1 DROP IN BOTH EYES TWO TIMES DAILY What changed:  how much to take how to take this when to take this   doxycycline 100 MG capsule Commonly known as: VIBRAMYCIN Take 1 capsule (100 mg total) by mouth 2 (two) times daily for 4 days.   Eliquis 5 MG Tabs  tablet Generic drug: apixaban Take 1 tablet (5 mg total) by mouth 2 (two) times daily.   Emgality 120 MG/ML Soaj Generic drug: Galcanezumab-gnlm Inject 120 mg as directed every 30 days.   GAS-X PO Take 2 tablets daily as needed by mouth (gas).   predniSONE 20 MG tablet Commonly known as: DELTASONE Take 2 tablets (40 mg total) by mouth daily with breakfast for 4 days.   promethazine 25 MG tablet Commonly known as: PHENERGAN Take 1 tablet (25 mg total) by mouth every 6 (six) hours as needed for nausea or vomiting.   Refresh Tears PF 0.5-0.9 % Soln Generic drug: Carboxymethylcell-Glycerin PF Place 1 drop into both eyes 3 (three) times daily as needed (for dryness).   rosuvastatin 5 MG tablet Commonly known as: Crestor Take 1 tablet (5 mg total) by mouth once a week. What changed: when to take this   sertraline 100 MG tablet Commonly known as: ZOLOFT Take 1 tablet (100 mg total) by  mouth daily.   sodium chloride 0.65 % Soln nasal spray Commonly known as: OCEAN Place 1 spray as needed into both nostrils for congestion.   SUMAtriptan 100 MG tablet Commonly known as: Imitrex TAKE 1 TABLET BY MOUTH AT THE ONSET OF A HEADACHE DAILY AS NEEDED What changed:  how much to take how to take this when to take this additional instructions   Ubrelvy 100 MG Tabs Generic drug: Ubrogepant Take 100 mg by mouth every 2 (two) hours as needed. Maximum 200mg  a day.        Allergies  Allergen Reactions   Dilaudid [Hydromorphone Hcl] Nausea And Vomiting   Fentanyl Nausea And Vomiting, Other (See Comments) and Nausea Only   Ciprofloxacin Rash   Erythromycin Rash   Percocet [Oxycodone-Acetaminophen] Nausea And Vomiting    Consultations:   Procedures:   Discharge Exam: BP 139/81 (BP Location: Right Arm)   Pulse 69   Temp 97.7 F (36.5 C) (Oral)   Resp 18   Ht 5\' 7"  (1.702 m)   Wt 64.4 kg   SpO2 95%   BMI 22.24 kg/m  Physical Exam Constitutional:      Appearance:  Normal appearance.  HENT:     Head: Normocephalic and atraumatic.     Mouth/Throat:     Mouth: Mucous membranes are moist.  Eyes:     Extraocular Movements: Extraocular movements intact.  Cardiovascular:     Rate and Rhythm: Normal rate.  Pulmonary:     Effort: Pulmonary effort is normal.     Breath sounds: Examination of the right-lower field reveals rhonchi. Rhonchi (mild) present. No wheezing.  Abdominal:     General: Bowel sounds are normal. There is no distension.     Palpations: Abdomen is soft.     Tenderness: There is no abdominal tenderness.  Musculoskeletal:        General: No swelling. Normal range of motion.     Cervical back: Normal range of motion and neck supple.  Skin:    General: Skin is warm and dry.  Neurological:     General: No focal deficit present.     Mental Status: She is alert.  Psychiatric:        Mood and Affect: Mood normal.        Behavior: Behavior normal.      The results of significant diagnostics from this hospitalization (including imaging, microbiology, ancillary and laboratory) are listed below for reference.   Microbiology: Recent Results (from the past 240 hour(s))  Resp panel by RT-PCR (RSV, Flu A&B, Covid) Anterior Nasal Swab     Status: None   Collection Time: 06/23/22  5:13 PM   Specimen: Anterior Nasal Swab  Result Value Ref Range Status   SARS Coronavirus 2 by RT PCR NEGATIVE NEGATIVE Final    Comment: (NOTE) SARS-CoV-2 target nucleic acids are NOT DETECTED.  The SARS-CoV-2 RNA is generally detectable in upper respiratory specimens during the acute phase of infection. The lowest concentration of SARS-CoV-2 viral copies this assay can detect is 138 copies/mL. A negative result does not preclude SARS-Cov-2 infection and should not be used as the sole basis for treatment or other patient management decisions. A negative result may occur with  improper specimen collection/handling, submission of specimen other than  nasopharyngeal swab, presence of viral mutation(s) within the areas targeted by this assay, and inadequate number of viral copies(<138 copies/mL). A negative result must be combined with clinical observations, patient history, and epidemiological information. The expected result is Negative.  Fact Sheet for Patients:  BloggerCourse.com  Fact Sheet for Healthcare Providers:  SeriousBroker.it  This test is no t yet approved or cleared by the [place] FDA and  has been authorized for detection and/or diagnosis of SARS-CoV-2 by FDA under an Emergency Use Authorization (EUA). This EUA will remain  in effect (meaning this test can be used) for the duration of the COVID-19 declaration under Section 564(b)(1) of the Act, 21 U.S.C.section 360bbb-3(b)(1), unless the authorization is terminated  or revoked sooner.       Influenza A by PCR NEGATIVE NEGATIVE Final   Influenza B by PCR NEGATIVE NEGATIVE Final    Comment: (NOTE) The Xpert Xpress SARS-CoV-2/FLU/RSV plus assay is intended as an aid in the diagnosis of influenza from Nasopharyngeal swab specimens and should not be used as a sole basis for treatment. Nasal washings and aspirates are unacceptable for Xpert Xpress SARS-CoV-2/FLU/RSV testing.  Fact Sheet for Patients: BloggerCourse.com  Fact Sheet for Healthcare Providers: SeriousBroker.it  This test is not yet approved or cleared by the [place] FDA and has been authorized for detection and/or diagnosis of SARS-CoV-2 by FDA under an Emergency Use Authorization (EUA). This EUA will remain in effect (meaning this test can be used) for the duration of the COVID-19 declaration under Section 564(b)(1) of the Act, 21 U.S.C. section 360bbb-3(b)(1), unless the authorization is terminated or revoked.     Resp Syncytial Virus by PCR NEGATIVE NEGATIVE Final    Comment:  (NOTE) Fact Sheet for Patients: BloggerCourse.com  Fact Sheet for Healthcare Providers: SeriousBroker.it  This test is not yet approved or cleared by the [place] FDA and has been authorized for detection and/or diagnosis of SARS-CoV-2 by FDA under an Emergency Use Authorization (EUA). This EUA will remain in effect (meaning this test can be used) for the duration of the COVID-19 declaration under Section 564(b)(1) of the Act, 21 U.S.C. section 360bbb-3(b)(1), unless the authorization is terminated or revoked.  Performed at [facility], [address], [place], [place] [address]   Respiratory (~20 pathogens) panel by PCR     Status: None   Collection Time: 06/23/22  5:15 PM   Specimen: Nasopharyngeal Swab; Respiratory  Result Value Ref Range Status   Adenovirus NOT DETECTED NOT DETECTED Final   Coronavirus 229E NOT DETECTED NOT DETECTED Final    Comment: (NOTE) The Coronavirus on the Respiratory Panel, DOES NOT test for the novel  Coronavirus (2019 nCoV)    Coronavirus HKU1 NOT DETECTED NOT DETECTED Final   Coronavirus NL63 NOT DETECTED NOT DETECTED Final   Coronavirus OC43 NOT DETECTED NOT DETECTED Final   Metapneumovirus NOT DETECTED NOT DETECTED Final   Rhinovirus / Enterovirus NOT DETECTED NOT DETECTED Final   Influenza A NOT DETECTED NOT DETECTED Final   Influenza B NOT DETECTED NOT DETECTED Final   Parainfluenza Virus 1 NOT DETECTED NOT DETECTED Final   Parainfluenza Virus 2 NOT DETECTED NOT DETECTED Final   Parainfluenza Virus 3 NOT DETECTED NOT DETECTED Final   Parainfluenza Virus 4 NOT DETECTED NOT DETECTED Final   Respiratory Syncytial Virus NOT DETECTED NOT DETECTED Final   Bordetella pertussis NOT DETECTED NOT DETECTED Final   Bordetella Parapertussis NOT DETECTED NOT DETECTED Final   Chlamydophila pneumoniae NOT DETECTED NOT DETECTED Final   Mycoplasma pneumoniae NOT DETECTED NOT DETECTED  Final    Comment: Performed at [facility], [address], [place], [place] [address]  Expectorated Sputum Assessment w Gram Stain, Rflx to Resp Cult  Status: None   Collection Time: 06/23/22  9:36 PM   Specimen: Expectorated Sputum  Result Value Ref Range Status   Specimen Description EXPECTORATED SPUTUM  Final   Special Requests NONE  Final   Sputum evaluation   Final    THIS SPECIMEN IS ACCEPTABLE FOR SPUTUM CULTURE Performed at [facility], [address], [place], [place] [address]    Report Status 06/23/2022 FINAL  Final  Culture, Respiratory w Gram Stain     Status: None   Collection Time: 06/23/22  9:36 PM  Result Value Ref Range Status   Specimen Description   Final    EXPECTORATED SPUTUM Performed at [facility], [address] [place], [place] [address]    Special Requests   Final    NONE Reflexed from [id number] Performed at [facility], [address], [place], [place] [address]    Gram Stain   Final    FEW WBC PRESENT,BOTH PMN AND MONONUCLEAR FEW GRAM NEGATIVE RODS    Culture   Final    FEW Normal respiratory flora-no Staph aureus or Pseudomonas seen Performed at [facility], [address], [place], [place] [address]    Report Status 06/26/2022 FINAL  Final     Labs: BNP (last 3 results) No results for input(s): "BNP" in the last 8760 hours. Basic Metabolic Panel: Recent Labs  Lab 06/23/22 1713 06/24/22 0513 06/25/22 0525 06/26/22 0518  NA 130* 133* 132* 133*  K 3.2* 3.6 4.3 4.2  CL 93* 96* 101 99  CO2 26 27 27 26   GLUCOSE 109* 148* 147* 120*  BUN 8 7* 13 20  CREATININE 0.65 0.50 0.65 0.62  CALCIUM 8.5* 8.1* 8.4* 8.6*  MG  --   --   --  2.3   Liver Function Tests: Recent Labs  Lab 06/23/22 1713 06/24/22 0513  AST 18 14*  ALT 16 16  ALKPHOS 75 69  BILITOT 0.5 0.4  PROT 6.8 6.1*  ALBUMIN 3.1* 2.7*   No results for input(s): "LIPASE", "AMYLASE" in the last 168  hours. No results for input(s): "AMMONIA" in the last 168 hours. CBC: Recent Labs  Lab 06/23/22 1713 06/24/22 0513 06/25/22 0525 06/26/22 0518  WBC 15.2* 13.9* 17.4* 15.9*  NEUTROABS 12.2*  --   --  13.5*  HGB 11.5* 10.9* 10.7* 10.9*  HCT 35.1* 33.1* 33.7* 34.7*  MCV 92.4 94.6 95.7 96.4  PLT 335 317 341 371   Cardiac Enzymes: No results for input(s): "CKTOTAL", "CKMB", "CKMBINDEX", "TROPONINI" in the last 168 hours. BNP: Invalid input(s): "POCBNP" CBG: No results for input(s): "GLUCAP" in the last 168 hours. D-Dimer No results for input(s): "DDIMER" in the last 72 hours. Hgb A1c No results for input(s): "HGBA1C" in the last 72 hours. Lipid Profile No results for input(s): "CHOL", "HDL", "LDLCALC", "TRIG", "CHOLHDL", "LDLDIRECT" in the last 72 hours. Thyroid function studies No results for input(s): "TSH", "T4TOTAL", "T3FREE", "THYROIDAB" in the last 72 hours.  Invalid input(s): "FREET3" Anemia work up No results for input(s): "VITAMINB12", "FOLATE", "FERRITIN", "TIBC", "IRON", "RETICCTPCT" in the last 72 hours. Urinalysis    Component Value Date/Time   COLORURINE YELLOW 04/12/2009 1947   APPEARANCEUR CLEAR 04/12/2009 1947   LABSPEC 1.010 04/12/2009 1947   PHURINE 6.0 04/12/2009 1947   GLUCOSEU NEGATIVE 04/12/2009 1947   HGBUR NEGATIVE 04/12/2009 1947   BILIRUBINUR negative 08/01/2021 0915   KETONESUR NEGATIVE 04/12/2009 1947   PROTEINUR Negative 08/01/2021 0915   PROTEINUR NEGATIVE 04/12/2009  1947   UROBILINOGEN 0.2 08/01/2021 0915   UROBILINOGEN 0.2 04/12/2009 1947   NITRITE negative 08/01/2021 0915   NITRITE NEGATIVE 04/12/2009 1947   LEUKOCYTESUR Trace (A) 08/01/2021 0915   Sepsis Labs Recent Labs  Lab 06/23/22 1713 06/24/22 0513 06/25/22 0525 06/26/22 0518  WBC 15.2* 13.9* 17.4* 15.9*   Microbiology Recent Results (from the past 240 hour(s))  Resp panel by RT-PCR (RSV, Flu A&B, Covid) Anterior Nasal Swab     Status: None   Collection Time: 06/23/22   5:13 PM   Specimen: Anterior Nasal Swab  Result Value Ref Range Status   SARS Coronavirus 2 by RT PCR NEGATIVE NEGATIVE Final    Comment: (NOTE) SARS-CoV-2 target nucleic acids are NOT DETECTED.  The SARS-CoV-2 RNA is generally detectable in upper respiratory specimens during the acute phase of infection. The lowest concentration of SARS-CoV-2 viral copies this assay can detect is 138 copies/mL. A negative result does not preclude SARS-Cov-2 infection and should not be used as the sole basis for treatment or other patient management decisions. A negative result may occur with  improper specimen collection/handling, submission of specimen other than nasopharyngeal swab, presence of viral mutation(s) within the areas targeted by this assay, and inadequate number of viral copies(<138 copies/mL). A negative result must be combined with clinical observations, patient history, and epidemiological information. The expected result is Negative.  Fact Sheet for Patients:  BloggerCourse.com  Fact Sheet for Healthcare Providers:  SeriousBroker.it  This test is no t yet approved or cleared by the [place] FDA and  has been authorized for detection and/or diagnosis of SARS-CoV-2 by FDA under an Emergency Use Authorization (EUA). This EUA will remain  in effect (meaning this test can be used) for the duration of the COVID-19 declaration under Section 564(b)(1) of the Act, 21 U.S.C.section 360bbb-3(b)(1), unless the authorization is terminated  or revoked sooner.       Influenza A by PCR NEGATIVE NEGATIVE Final   Influenza B by PCR NEGATIVE NEGATIVE Final    Comment: (NOTE) The Xpert Xpress SARS-CoV-2/FLU/RSV plus assay is intended as an aid in the diagnosis of influenza from Nasopharyngeal swab specimens and should not be used as a sole basis for treatment. Nasal washings and aspirates are unacceptable for Xpert Xpress  SARS-CoV-2/FLU/RSV testing.  Fact Sheet for Patients: BloggerCourse.com  Fact Sheet for Healthcare Providers: SeriousBroker.it  This test is not yet approved or cleared by the [place] FDA and has been authorized for detection and/or diagnosis of SARS-CoV-2 by FDA under an Emergency Use Authorization (EUA). This EUA will remain in effect (meaning this test can be used) for the duration of the COVID-19 declaration under Section 564(b)(1) of the Act, 21 U.S.C. section 360bbb-3(b)(1), unless the authorization is terminated or revoked.     Resp Syncytial Virus by PCR NEGATIVE NEGATIVE Final    Comment: (NOTE) Fact Sheet for Patients: BloggerCourse.com  Fact Sheet for Healthcare Providers: SeriousBroker.it  This test is not yet approved or cleared by the [place] FDA and has been authorized for detection and/or diagnosis of SARS-CoV-2 by FDA under an Emergency Use Authorization (EUA). This EUA will remain in effect (meaning this test can be used) for the duration of the COVID-19 declaration under Section 564(b)(1) of the Act, 21 U.S.C. section 360bbb-3(b)(1), unless the authorization is terminated or revoked.  Performed at [facility], [address], [place], [place] [address]   Respiratory (~20 pathogens) panel by PCR     Status: None  Collection Time: 06/23/22  5:15 PM   Specimen: Nasopharyngeal Swab; Respiratory  Result Value Ref Range Status   Adenovirus NOT DETECTED NOT DETECTED Final   Coronavirus 229E NOT DETECTED NOT DETECTED Final    Comment: (NOTE) The Coronavirus on the Respiratory Panel, DOES NOT test for the novel  Coronavirus (2019 nCoV)    Coronavirus HKU1 NOT DETECTED NOT DETECTED Final   Coronavirus NL63 NOT DETECTED NOT DETECTED Final   Coronavirus OC43 NOT DETECTED NOT DETECTED Final   Metapneumovirus NOT DETECTED NOT  DETECTED Final   Rhinovirus / Enterovirus NOT DETECTED NOT DETECTED Final   Influenza A NOT DETECTED NOT DETECTED Final   Influenza B NOT DETECTED NOT DETECTED Final   Parainfluenza Virus 1 NOT DETECTED NOT DETECTED Final   Parainfluenza Virus 2 NOT DETECTED NOT DETECTED Final   Parainfluenza Virus 3 NOT DETECTED NOT DETECTED Final   Parainfluenza Virus 4 NOT DETECTED NOT DETECTED Final   Respiratory Syncytial Virus NOT DETECTED NOT DETECTED Final   Bordetella pertussis NOT DETECTED NOT DETECTED Final   Bordetella Parapertussis NOT DETECTED NOT DETECTED Final   Chlamydophila pneumoniae NOT DETECTED NOT DETECTED Final   Mycoplasma pneumoniae NOT DETECTED NOT DETECTED Final    Comment: Performed at [facility], [address], [place], [place] [address]  Expectorated Sputum Assessment w Gram Stain, Rflx to Resp Cult     Status: None   Collection Time: 06/23/22  9:36 PM   Specimen: Expectorated Sputum  Result Value Ref Range Status   Specimen Description EXPECTORATED SPUTUM  Final   Special Requests NONE  Final   Sputum evaluation   Final    THIS SPECIMEN IS ACCEPTABLE FOR SPUTUM CULTURE Performed at [facility], [address], [place], [place] [address]    Report Status 06/23/2022 FINAL  Final  Culture, Respiratory w Gram Stain     Status: None   Collection Time: 06/23/22  9:36 PM  Result Value Ref Range Status   Specimen Description   Final    EXPECTORATED SPUTUM Performed at [facility], [address], [place], [place] [address]    Special Requests   Final    NONE Reflexed from [id number] Performed at [facility], [address] [place], [place] [address]    Gram Stain   Final    FEW WBC PRESENT,BOTH PMN AND MONONUCLEAR FEW GRAM NEGATIVE RODS    Culture   Final    FEW Normal respiratory flora-no Staph aureus or Pseudomonas seen Performed at [facility], [address], [place], [place] [address]     Report Status 06/26/2022 FINAL  Final    Procedures/Studies: DG Chest 2 View  Result Date: 06/23/2022 CLINICAL DATA:  Shortness of breath and productive cough. EXAM: CHEST - 2 VIEW COMPARISON:  Chest CT February 28, 2022 FINDINGS: Tortuosity and calcific atherosclerotic disease of the aorta. Cardiomediastinal silhouette is normal. Mediastinal contours appear intact. Nodularity versus airspace consolidation in the right lung base, better seen on the lateral view. Osseous structures are without acute abnormality. Soft tissues are grossly normal. IMPRESSION: Nodularity versus airspace consolidation in the right lung base. Close follow-up is recommended to assure resolution. Electronically Signed   By: Ted Mcalpine M.D.   On: 06/23/2022 17:01     Time coordinating discharge: Over 30 minutes    Lewie Chamber, MD  [facility] 06/26/2022, 3:59 PM

## 2022-06-26 NOTE — Progress Notes (Signed)
SATURATION QUALIFICATIONS: (This note is used to comply with regulatory documentation for home oxygen)  Patient Saturations on Room Air at Rest = 91-92%  Patient Saturations on Room Air while Ambulating = 88-90%   Please briefly explain why patient needs home oxygen:  Patient's lowest sat reading while ambulating on RA was 88%. Patient denied SOB but said she did feel a bit tired faster than normal.

## 2022-06-26 NOTE — Progress Notes (Signed)
Mobility Specialist - Progress Note   06/26/22 1014  Mobility  Activity Ambulated independently in hallway;Ambulated independently to bathroom  Level of Assistance Independent  Distance Ambulated (ft) 460 ft  Activity Response Tolerated well  Mobility Referral Yes  $Mobility charge 1 Mobility  Mobility Specialist Start Time (ACUTE ONLY) 1005  Mobility Specialist Stop Time (ACUTE ONLY) 1013  Mobility Specialist Time Calculation (min) (ACUTE ONLY) 8 min   Pt received in bed and agreeable to mobility. No complaints during session. Pt to room standing after session with all needs met.    [facility]

## 2022-06-26 NOTE — Progress Notes (Signed)
AVS and discharge instructions reviewed w/ patient. Patient verbalized understanding and had no further questions. 

## 2022-06-26 NOTE — Plan of Care (Signed)

## 2022-06-27 ENCOUNTER — Telehealth: Payer: Self-pay

## 2022-06-27 ENCOUNTER — Other Ambulatory Visit (HOSPITAL_COMMUNITY): Payer: Self-pay

## 2022-06-27 ENCOUNTER — Telehealth: Payer: Self-pay | Admitting: Family Medicine

## 2022-06-27 ENCOUNTER — Other Ambulatory Visit: Payer: Self-pay

## 2022-06-27 NOTE — Transitions of Care (Post Inpatient/ED Visit) (Signed)
   06/27/2022  Name: RIVEN FORST MRN: [id number] DOB: 03-28-1950  Today's TOC FU Call Status: Today's TOC FU Call Status:: Unsuccessul Call (1st Attempt) Unsuccessful Call (1st Attempt) Date: 06/27/22  Attempted to reach the patient regarding the most recent Inpatient/ED visit.  Follow Up Plan: Additional outreach attempts will be made to reach the patient to complete the Transitions of Care (Post Inpatient/ED visit) call.      Antionette Fairy, RN,BSN,CCM [facility] Care Management Care Management Community Coordinator Direct Phone: [phone] Toll Free: [phone] Fax: [phone]

## 2022-06-27 NOTE — Telephone Encounter (Signed)
Patient is scheduled with Dr. Durene Cal 07/02/22 at 11:40 am

## 2022-06-27 NOTE — Transitions of Care (Post Inpatient/ED Visit) (Signed)
06/27/2022  Name: Suzanne [name] MRN: [id number] DOB: 1951-01-16  Today's TOC FU Call Status: Today's TOC FU Call Status:: Successful TOC FU Call Competed TOC FU Call Complete Date: 06/27/22 (Incoming call from patient returning RN CM call)  Transition Care Management Follow-up Telephone Call Date of Discharge: 06/26/22 Discharge Facility: [facility]) Type of Discharge: Inpatient Admission Primary Inpatient Discharge Diagnosis:: "CAP" How have you been since you were released from the hospital?: Better (Pt voices she's doing fairly well-was able to get a few hrs of good sleep last night-still has prod cough at times of clear sputum. She has had some mild SOb w/exertion-easily relieved with rest. Appetite good.LBM several days ago-will take Senokot today) Any questions or concerns?: No  Items Reviewed: Did you receive and understand the discharge instructions provided?: Yes Medications obtained,verified, and reconciled?: Yes (Medications Reviewed) Any new allergies since your discharge?: No Dietary orders reviewed?: Yes Type of Diet Ordered:: low salt/heart healthy Do you have support at home?: Yes People in Home: spouse Name of Support/Comfort Primary Source: Renae Fickle  Medications Reviewed Today: Medications Reviewed Today     Reviewed by Charlyn Minerva, RN (Registered Nurse) on 06/27/22 at 1324  Med List Status: <None>   Medication Order Taking? Sig Documenting Provider Last Dose Status Informant  acetaminophen (TYLENOL) 325 MG tablet [id number]  Take 325 mg 2 (two) times daily as needed by mouth (pain).  [provider]  Active Self  albuterol (VENTOLIN HFA) 108 (90 Base) MCG/ACT inhaler [id number]  INHALE 2 PUFFS INTO THE LUNGS EVERY 6 HOURS AS NEEDED FOR WHEEZING OR SHORTNESS OF BREATH. Shelva Majestic, MD  Active Self  ALPRAZolam Prudy Feeler) 0.5 MG tablet [id number]  Take 1 tablet (0.5 mg total) by mouth 2 (two) times daily as needed for anxiety.   Active  Self  ALPRAZolam (XANAX) 0.5 MG tablet [id number] No Take 1 tablet (0.5 mg) by mouth twice daily as needed.  Patient not taking: Reported on 06/27/2022    Not Taking Active   amoxicillin (AMOXIL) 875 MG tablet [id number] Yes Take 1 tablet (875 mg total) by mouth 2 (two) times daily for 4 days. Lewie Chamber, MD Taking Active   apixaban (ELIQUIS) 5 MG TABS tablet [id number] Yes Take 1 tablet (5 mg total) by mouth 2 (two) times daily. Sheilah Pigeon, PA-C Taking Active   buPROPion (WELLBUTRIN XL) 300 MG 24 hr tablet [id number] Yes Take 1 tablet by mouth daily  Patient taking differently: Take 300 mg by mouth in the morning.    Taking Active Self  butalbital-acetaminophen-caffeine (FIORICET) 50-325-40 MG tablet [id number] Yes Take 1 tablet by mouth every 6 (six) hours as needed. For headache  Patient taking differently: Take 1 tablet by mouth every 6 (six) hours as needed for headache.   Glean Salvo, NP Taking Active Self  chlorpheniramine-HYDROcodone (TUSSIONEX) 10-8 MG/5ML [id number] Yes Take 5 mLs by mouth every 12 (twelve) hours as needed for cough. Lewie Chamber, MD Taking Active   dorzolamide-timolol (COSOPT) 2-0.5 % ophthalmic solution [id number] Yes PLACE 1 DROP IN BOTH EYES TWO TIMES DAILY  Patient taking differently: Place 1 drop into both eyes 2 (two) times daily.   Shelva Majestic, MD Taking Active Self  doxycycline (VIBRAMYCIN) 100 MG capsule [id number] Yes Take 1 capsule (100 mg total) by mouth 2 (two) times daily for 4 days. Lewie Chamber, MD Taking Active   Galcanezumab-gnlm [facility]) 120 MG/ML Ivory Broad [id number] Yes Inject 120 mg as directed every 30 days. Anson Fret,  MD Taking Active Self  predniSONE (DELTASONE) 20 MG tablet [id number] Yes Take 2 tablets (40 mg total) by mouth daily with breakfast for 4 days. Lewie Chamber, MD Taking Active   Probiotic Product (ALIGN) 4 MG CAPS [id number] Yes Take 4 mg by mouth See admin instructions. Take 4 mg by mouth once a day when taking  antibiotics [provider] Taking Active Self  promethazine (PHENERGAN) 25 MG tablet [id number]  Take 1 tablet (25 mg total) by mouth every 6 (six) hours as needed for nausea or vomiting. Glean Salvo, NP  Active Self  REFRESH TEARS PF 0.5-0.9 % SOLN [id number] Yes Place 1 drop into both eyes 3 (three) times daily as needed (for dryness). [provider] Taking Active Self  rosuvastatin (CRESTOR) 5 MG tablet [id number] Yes Take 1 tablet (5 mg total) by mouth once a week.  Patient taking differently: Take 5 mg by mouth every Saturday.   Shelva Majestic, MD Taking Active Self  senna (SENOKOT) 8.6 MG tablet [id number] Yes Take 1 tablet by mouth daily as needed for constipation. [provider] Taking Active Self  sertraline (ZOLOFT) 100 MG tablet [id number] Yes Take 1 tablet (100 mg total) by mouth daily.  Taking Active Self  Simethicone (GAS-X PO) [id number]  Take 2 tablets daily as needed by mouth (gas). [provider]  Active Self  sodium chloride (OCEAN) 0.65 % SOLN nasal spray [id number]  Place 1 spray as needed into both nostrils for congestion. [provider]  Active Self  SUMAtriptan (IMITREX) 100 MG tablet [id number] Yes TAKE 1 TABLET BY MOUTH AT THE ONSET OF A HEADACHE DAILY AS NEEDED  Patient taking differently: Take 100 mg by mouth See admin instructions. TAKE 100 MG BY MOUTH AT THE ONSET OF A HEADACHE DAILY AS NEEDED   Glean Salvo, NP Taking Active Self  Ubrogepant (UBRELVY) 100 MG TABS [id number]  Take 100 mg by mouth every 2 (two) hours as needed. Maximum 200mg  a day.  Patient not taking: Reported on 06/23/2022   Anson Fret, MD  Active Self            Home Care and Equipment/Supplies: Were Home Health Services Ordered?: NA Any new equipment or medical supplies ordered?: NA  Functional Questionnaire: Do you need assistance with bathing/showering or dressing?: No Do you need assistance with meal preparation?: No Do you need  assistance with eating?: No Do you have difficulty maintaining continence: No Do you need assistance with getting out of bed/getting out of a chair/moving?: No Do you have difficulty managing or taking your medications?: No  Follow up appointments reviewed: PCP Follow-up appointment confirmed?: No (pt/spouse has already contacted MD office-awaiting return call regarding MD's availability and appt date/time) MD Provider Line Number:[phone] Given: No Specialist Hospital Follow-up appointment confirmed?: NA Do you need transportation to your follow-up appointment?: No Do you understand care options if your condition(s) worsen?: Yes-patient verbalized understanding  SDOH Interventions Today    Flowsheet Row Most Recent Value  SDOH Interventions   Food Insecurity Interventions Intervention Not Indicated  Transportation Interventions Intervention Not Indicated      TOC Interventions Today    Flowsheet Row Most Recent Value  TOC Interventions   TOC Interventions Discussed/Reviewed TOC Interventions Discussed, S/S of infection      Interventions Today    Flowsheet Row Most Recent Value  General Interventions   General Interventions Discussed/Reviewed General Interventions Discussed, Doctor Visits  Doctor Visits Discussed/Reviewed Doctor Visits Discussed, PCP  PCP/Specialist Visits  Compliance with follow-up visit  Education Interventions   Education Provided Provided Education  Provided Verbal Education On Nutrition, Medication, When to see the doctor, Other  [resp sx mgmt,bowel mgmt]  Nutrition Interventions   Nutrition Discussed/Reviewed Nutrition Discussed, Adding fruits and vegetables  Pharmacy Interventions   Pharmacy Dicussed/Reviewed Pharmacy Topics Discussed, Medications and their functions  Safety Interventions   Safety Discussed/Reviewed Safety Discussed       Alessandra Grout [facility] Care Management Care Management Community  Coordinator Direct Phone: [phone] Toll Free: [phone] Fax: [phone]

## 2022-06-27 NOTE — Telephone Encounter (Signed)
See below

## 2022-06-27 NOTE — Telephone Encounter (Signed)
1140 on Wednesday the 29th-other than this I do not have availability unless there is a cancellation

## 2022-06-27 NOTE — Telephone Encounter (Signed)
Patient requests Hospital Follow Up appointment next week (discharged from [facility] 06/26/22). No appointments available. Please advise where we can work her in.

## 2022-07-02 ENCOUNTER — Ambulatory Visit: Payer: Medicare PPO | Admitting: Family Medicine

## 2022-07-02 ENCOUNTER — Encounter: Payer: Self-pay | Admitting: Family Medicine

## 2022-07-02 VITALS — BP 120/72 | HR 80 | Temp 97.8°F | Ht 67.0 in | Wt 144.4 lb

## 2022-07-02 DIAGNOSIS — D649 Anemia, unspecified: Secondary | ICD-10-CM | POA: Diagnosis not present

## 2022-07-02 DIAGNOSIS — E7800 Pure hypercholesterolemia, unspecified: Secondary | ICD-10-CM | POA: Diagnosis not present

## 2022-07-02 DIAGNOSIS — I480 Paroxysmal atrial fibrillation: Secondary | ICD-10-CM | POA: Diagnosis not present

## 2022-07-02 DIAGNOSIS — F325 Major depressive disorder, single episode, in full remission: Secondary | ICD-10-CM

## 2022-07-02 DIAGNOSIS — Z720 Tobacco use: Secondary | ICD-10-CM

## 2022-07-02 DIAGNOSIS — I700 Atherosclerosis of aorta: Secondary | ICD-10-CM

## 2022-07-02 DIAGNOSIS — J441 Chronic obstructive pulmonary disease with (acute) exacerbation: Secondary | ICD-10-CM

## 2022-07-02 DIAGNOSIS — E559 Vitamin D deficiency, unspecified: Secondary | ICD-10-CM | POA: Diagnosis not present

## 2022-07-02 DIAGNOSIS — J189 Pneumonia, unspecified organism: Secondary | ICD-10-CM

## 2022-07-02 LAB — CBC WITH DIFFERENTIAL/PLATELET
Basophils Absolute: 0.1 10*3/uL (ref 0.0–0.1)
Basophils Relative: 0.7 % (ref 0.0–3.0)
Eosinophils Absolute: 0.1 10*3/uL (ref 0.0–0.7)
Eosinophils Relative: 0.5 % (ref 0.0–5.0)
HCT: 39.3 % (ref 36.0–46.0)
Hemoglobin: 12.8 g/dL (ref 12.0–15.0)
Lymphocytes Relative: 22.3 % (ref 12.0–46.0)
Lymphs Abs: 2.1 10*3/uL (ref 0.7–4.0)
MCHC: 32.6 g/dL (ref 30.0–36.0)
MCV: 94.3 fl (ref 78.0–100.0)
Monocytes Absolute: 0.8 10*3/uL (ref 0.1–1.0)
Monocytes Relative: 8.9 % (ref 3.0–12.0)
Neutro Abs: 6.3 10*3/uL (ref 1.4–7.7)
Neutrophils Relative %: 67.6 % (ref 43.0–77.0)
Platelets: 327 10*3/uL (ref 150.0–400.0)
RBC: 4.17 Mil/uL (ref 3.87–5.11)
RDW: 14.3 % (ref 11.5–15.5)
WBC: 9.4 10*3/uL (ref 4.0–10.5)

## 2022-07-02 LAB — COMPREHENSIVE METABOLIC PANEL
ALT: 17 U/L (ref 0–35)
AST: 17 U/L (ref 0–37)
Albumin: 3.6 g/dL (ref 3.5–5.2)
Alkaline Phosphatase: 68 U/L (ref 39–117)
BUN: 23 mg/dL (ref 6–23)
CO2: 31 mEq/L (ref 19–32)
Calcium: 9 mg/dL (ref 8.4–10.5)
Chloride: 97 mEq/L (ref 96–112)
Creatinine, Ser: 0.82 mg/dL (ref 0.40–1.20)
GFR: 70.84 mL/min (ref >60.00)
Glucose, Bld: 87 mg/dL (ref 70–99)
Potassium: 4.5 mEq/L (ref 3.5–5.1)
Sodium: 135 mEq/L (ref 135–145)
Total Bilirubin: 0.4 mg/dL (ref 0.2–1.2)
Total Protein: 6 g/dL (ref 6.0–8.3)

## 2022-07-02 LAB — IBC + FERRITIN
Ferritin: 88.7 ng/mL (ref 10.0–291.0)
Iron: 89 ug/dL (ref 42–145)
Saturation Ratios: 31.8 % (ref 20.0–50.0)
TIBC: 280 ug/dL (ref 250.0–450.0)
Transferrin: 200 mg/dL — ABNORMAL LOW (ref 212.0–360.0)

## 2022-07-02 LAB — VITAMIN D 25 HYDROXY (VIT D DEFICIENCY, FRACTURES): VITD: 13.16 ng/mL — ABNORMAL LOW (ref 30.00–100.00)

## 2022-07-02 NOTE — Assessment & Plan Note (Signed)
No wheezing on exam today.  COPD exacerbation in light of pneumonia appears to have resolved-continue baseline albuterol as needed-not on any other inhalers.  If recurrent issues we may need to look at controller/maintenance inhaler

## 2022-07-02 NOTE — Assessment & Plan Note (Signed)
S: Medication: periodic 2000 units twice a week. Drinks some milk Lab Results  Component Value Date   VD25OH 28.63 (L) 08/01/2021  A/P: Vitamin D slightly low in the past-hopefully has improved-discussed higher dose potentially such as 50,000 units once a week if still below 30

## 2022-07-02 NOTE — Assessment & Plan Note (Signed)
Hopefully cholesterol is improving on rosuvastatin 5 mg once a week-max tolerable dose-migraines in the past with statin but seems to be doing better-at next follow-up will be due for lipids Lab Results  Component Value Date   CHOL 264 (H) 08/01/2021   HDL 71.90 08/01/2021   LDLCALC 171 (H) 08/01/2021   TRIG 107.0 08/01/2021   CHOLHDL 4 08/01/2021

## 2022-07-02 NOTE — Assessment & Plan Note (Signed)
Congratulated on being 3 weeks cigarette free but encouraged long-term cessation

## 2022-07-02 NOTE — Assessment & Plan Note (Signed)
Thankfully no recurrence despite stress of recent hospitalization.  She remains on Eliquis 5 mg twice daily long-term.  Rate controlled without medicine.  Appropriate anticoagulated and rate controlled-continue current treatment

## 2022-07-02 NOTE — Patient Instructions (Addendum)
Anticipate continued gradual recovery over next 1-2 months. Can take a while to fully recover from pneumonia- I want you to be as active as you feel comfortable but also to rest and not overly push it- also per letter prefer for you to be reasonably close to home so we can check in if needed if symptoms worsen  Congrats on being cigarette free for 3 weeks- keep up the great job!   We have placed a referral for you today to PREP program- I would just ask them directly if you have not heard within 2 weeks since husband already plugged in.   Please stop by lab before you go If you have mychart- we will send your results within 3 business days of [place] receiving them.  If you do not have mychart- we will call you about results within 5 business days of [place] receiving them.  *please also note that you will see labs on mychart as soon as they post. I will later go in and write notes on them- will say "notes from Dr. Durene Cal"   In about 4 weeks to make sure lungs have cleared Please go to [place]  central X-ray (updated 03/31/2019) - located [address] across the [address] from [place] - in the basement - Hours: 8:30-5:00 PM M-F (with lunch from 12:30- 1 PM). You do NOT need an appointment.

## 2022-07-02 NOTE — Progress Notes (Signed)
Phone [phone]   Subjective:  Suzanne [name] is a 72 y.o. year old very pleasant female patient who presents for transitional care management and hospital follow up for pneumonia. Patient was hospitalized from 06/23/22 to 06/26/22. A TCM phone call was completed on 06/27/22. Medical complexity moderate-please note needed ongoing efforts to help prevent rehospitalization including referral to exercise program to improve strength and conditioning.  Also required coordination of repeat chest x-ray as well as designating follow-up checkpoint  Patient presented to the hospital patient presented to the hospital with weakness and persistent cough after getting home from a trip to [place].  She presented to the emergency room when she had persistent and worsening symptoms.  She was noted to be hypoxic in the emergency room down into the 80s and was started on oxygen.  Chest x-ray notable for right lower lobe consolidation concerning for pneumonia.  Sputum cultures were obtained and negative.  She was started on antibiotics initially with Rocephin and doxycycline and later transition to amoxicillin and doxycycline on discharge orally-due to COPD/asthma history also given a short course of prednisone .  Flu, COVID, RSV were negative.  Respiratory viral panel performed and was negative.  She was slowly weaned off oxygen during hospitalization.  She did have mild hyponatremia and oral intake was encouraged.  Of note she did have a leukocytosis that was largely stable-was at 15.9 before discharge.  Also had mild anemia- see below.  Troponin trend was flat - Smoking cessation was advised in hospital and again today (thankfully she has not smoked in 3 weeks)   For chronic medical conditions - Her paroxysmal atrial fibrillation thankfully remained in normal sinus rhythm.  She was maintained on Eliquis -For her lipids she was maintained on rosuvastatin - For her migraine she was continued with Imitrex as needed  Bernita Raisin didn't help).  none in hospital.  [facility] on outpatient basis. - Anxiety was managed with combination of Wellbutrin/Zoloft/alprazolam   Today she reports finishing anitbiotics and prednisone (she was very hungry). Had not eaten well in a week before going into hospital.  Breathing continues to improve.  No recent fever or chills.  No cough during visit today  Chest x-ray from 06/23/2022 was reviewed-airway is normal, no bony abnormalities, cardiac silhouette normal, diaphragm normal, lung fields bilaterally normal except for opacity noted in right lower lung   See problem oriented charting as well  Past Medical History-  Patient Active Problem List   Diagnosis Date Noted   CAP (community acquired pneumonia) 06/23/2022    Priority: High   Paroxysmal atrial fibrillation [facility]) s/p ablation november 2018, 2011 previously 12/16/2016    Priority: High   Tobacco abuse 10/03/2013    Priority: High   COPD (chronic obstructive pulmonary disease) (HCC) 02/01/2009    Priority: High   Glaucoma 01/08/2018    Priority: Medium    Primary localized osteoarthritis of left knee 01/08/2018    Priority: Medium    Vitamin D deficiency 01/08/2018    Priority: Medium    High cholesterol     Priority: Medium    Intractable chronic migraine without aura 05/17/2014    Priority: Medium    Depression, major, single episode, complete remission (HCC) 02/01/2009    Priority: Medium    Asthma 02/01/2009    Priority: Medium    Nasal injury 10/20/2016    Priority: Low   S/P ablation of atrial fibrillation 05/16/2013    Priority: Low   Atherosclerosis of aorta (HCC) 07/02/2022   Migraine  without aura and without status migrainosus, not intractable 12/13/2021    Medications- reviewed and updated  A medical reconciliation was performed comparing current medicines to hospital discharge medications. Current Outpatient Medications  Medication Sig Dispense Refill   acetaminophen (TYLENOL) 325 MG tablet  Take 325 mg 2 (two) times daily as needed by mouth (pain).      albuterol (VENTOLIN HFA) 108 (90 Base) MCG/ACT inhaler INHALE 2 PUFFS INTO THE LUNGS EVERY 6 HOURS AS NEEDED FOR WHEEZING OR SHORTNESS OF BREATH. 6.7 g 2   apixaban (ELIQUIS) 5 MG TABS tablet Take 1 tablet (5 mg total) by mouth 2 (two) times daily. 180 tablet 1   buPROPion (WELLBUTRIN XL) 300 MG 24 hr tablet Take 1 tablet by mouth daily (Patient taking differently: Take 300 mg by mouth in the morning.) 90 tablet 1   butalbital-acetaminophen-caffeine (FIORICET) 50-325-40 MG tablet Take 1 tablet by mouth every 6 (six) hours as needed. For headache (Patient taking differently: Take 1 tablet by mouth every 6 (six) hours as needed for headache.) 30 tablet 0   dorzolamide-timolol (COSOPT) 2-0.5 % ophthalmic solution PLACE 1 DROP IN BOTH EYES TWO TIMES DAILY (Patient taking differently: Place 1 drop into both eyes 2 (two) times daily.) 20 mL 4   Galcanezumab-gnlm (EMGALITY) 120 MG/ML SOAJ Inject 120 mg as directed every 30 days. 3 mL 4   Probiotic Product (ALIGN) 4 MG CAPS Take 4 mg by mouth See admin instructions. Take 4 mg by mouth once a day when taking antibiotics     REFRESH TEARS PF 0.5-0.9 % SOLN Place 1 drop into both eyes 3 (three) times daily as needed (for dryness).     rosuvastatin (CRESTOR) 5 MG tablet Take 1 tablet (5 mg total) by mouth once a week. (Patient taking differently: Take 5 mg by mouth every Saturday.) 13 tablet 3   sertraline (ZOLOFT) 100 MG tablet Take 1 tablet (100 mg total) by mouth daily. 90 tablet 1   Simethicone (GAS-X PO) Take 2 tablets daily as needed by mouth (gas).     sodium chloride (OCEAN) 0.65 % SOLN nasal spray Place 1 spray as needed into both nostrils for congestion.     SUMAtriptan (IMITREX) 100 MG tablet TAKE 1 TABLET BY MOUTH AT THE ONSET OF A HEADACHE DAILY AS NEEDED (Patient taking differently: Take 100 mg by mouth See admin instructions. TAKE 100 MG BY MOUTH AT THE ONSET OF A HEADACHE DAILY AS  NEEDED) 27 tablet 0   Ubrogepant (UBRELVY) 100 MG TABS Take 100 mg by mouth every 2 (two) hours as needed. Maximum 200mg  a day. 4 tablet 0   ALPRAZolam (XANAX) 0.5 MG tablet Take 1 tablet (0.5 mg total) by mouth 2 (two) times daily as needed for anxiety. (Patient not taking: Reported on 07/02/2022) 60 tablet 2   promethazine (PHENERGAN) 25 MG tablet Take 1 tablet (25 mg total) by mouth every 6 (six) hours as needed for nausea or vomiting. (Patient not taking: Reported on 07/02/2022) 20 tablet 1   senna (SENOKOT) 8.6 MG tablet Take 1 tablet by mouth daily as needed for constipation. (Patient not taking: Reported on 07/02/2022)     No current facility-administered medications for this visit.   Objective  Objective:  BP 120/72   Pulse 80   Temp 97.8 F (36.6 C)   Ht 5\' 7"  (1.702 m)   Wt 144 lb 6.4 oz (65.5 kg)   SpO2 96%   BMI 22.62 kg/m  Gen: NAD, resting comfortably, appears somewhat  fatigued compared to last visit CV: RRR no murmurs rubs or gallops Lungs: CTAB no crackles, wheeze, rhonchi Ext: no edema Skin: warm, dry   Assessment and Plan:   # Hospital follow-up for CAP (community acquired pneumonia) Patient recently hospitalized for pneumonia obviously worsened by underlying COPD.  Was initially oxygen requirement but was able to be weaned off.  She was appropriately treated with Rocephin and doxycycline initially and later transition to amoxicillin and doxycycline which she finished the full course of.  She was also treated with prednisone for her COPD-has had significant improvement in her breathing but we discussed can take some time to fully recover from pneumonia - She has excellent questions about prevention of future flares - Strongly encourage smoking cessation - Discussed Prevnar 20 when she is feeling better - Discussed exercise plan-she is interested in the prep program through the [facility] and referral was made -trip to [place] upcoming previously planned-I strongly  recommended against travel and specifically plane flights-I wrote a letter in support for her of this plan -Needs repeat chest x-ray in 1 month-this was ordered today and expressed importance of following through with this-I also placed a reminder to myself to check and make sure this was completed within 6 weeks -Also had anemia and we opted to repeat CBC (also allows [place] to trend her white blood count which was elevated likely related to steroids and illness)-also check ferritin stores    Paroxysmal atrial fibrillation [facility]) s/p ablation november 2018, 2011 previously Thankfully no recurrence despite stress of recent hospitalization.  She remains on Eliquis 5 mg twice daily long-term.  Rate controlled without medicine.  Appropriate anticoagulated and rate controlled-continue current treatment  Tobacco abuse Congratulated on being 3 weeks cigarette free but encouraged long-term cessation  COPD (chronic obstructive pulmonary disease) (HCC) No wheezing on exam today.  COPD exacerbation in light of pneumonia appears to have resolved-continue baseline albuterol as needed-not on any other inhalers.  If recurrent issues we may need to look at controller/maintenance inhaler  High cholesterol Hopefully cholesterol is improving on rosuvastatin 5 mg once a week-max tolerable dose-migraines in the past with statin but seems to be doing better-at next follow-up will be due for lipids Lab Results  Component Value Date   CHOL 264 (H) 08/01/2021   HDL 71.90 08/01/2021   LDLCALC 171 (H) 08/01/2021   TRIG 107.0 08/01/2021   CHOLHDL 4 08/01/2021  -She also has aortic atherosclerosis and we would prefer to get LDL under 70 in the long run if tolerable.  Presumed aortic atherosclerosis at least stable-update lipids next visit-for now continue current medication which is max tolerable dose  Vitamin D deficiency S: Medication: periodic 2000 units twice a week. Drinks some milk Lab Results  Component Value  Date   VD25OH 28.63 (L) 08/01/2021  A/P: Vitamin D slightly low in the past-hopefully has improved-discussed higher dose potentially such as 50,000 units once a week if still below 30  # Depression-patient reports stable on Wellbutrin, Zoloft.  Alprazolam as needed for anxiety-manage to psychiatry directly  Recommended follow up: Return for next already scheduled visit or sooner if needed. Future Appointments  Date Time Provider Department Center  07/03/2022  1:15 PM Glean Salvo, NP [facility] None  08/05/2022  8:00 AM Shelva Majestic, MD [facility]  10/21/2022 10:30 AM Regan Lemming, MD [facility]    Lab/Order associations:   ICD-10-CM   1. Pneumonia of right lower lobe due to infectious organism  J18.9 Comprehensive  metabolic panel    CBC with Differential/Platelet    DG Chest 2 View    Amb Referral To Provider Referral Exercise Program (P.R.E.P)    2. Vitamin D deficiency  E55.9 VITAMIN D 25 Hydroxy (Vit-D Deficiency, Fractures)    3. Paroxysmal atrial fibrillation [facility]) s/p ablation november 2018, 2011 previously  I48.0     4. Chronic obstructive pulmonary disease with acute exacerbation (HCC)  J44.1     5. Atherosclerosis of aorta (HCC)  I70.0     6. Depression, major, single episode, complete remission (HCC) Chronic F32.5     7. High cholesterol  E78.00     8. Community acquired pneumonia of right lower lobe of lung  J18.9     9. Anemia, unspecified type  D64.9 IBC + Ferritin    10. Tobacco abuse  Z72.0      Time based coding not necessary for TCM  Time Spent: 43 minutes of total time (12:05 PM-12:40 PM, 2:20 PM-2:28 PM,) was spent on the date of the encounter performing the following actions: chart review prior to seeing the patient including reviewing discharge summary, obtaining history, performing a medically necessary exam, counseling on the treatment plan and pneumonia prevention as well as importance of quitting smoking, placing orders, and  documenting in our EHR.    Return precautions advised.  Tana Conch, MD

## 2022-07-02 NOTE — Assessment & Plan Note (Addendum)
Patient recently hospitalized for pneumonia obviously worsened by underlying COPD.  Was initially oxygen requirement but was able to be weaned off.  She was appropriately treated with Rocephin and doxycycline initially and later transition to amoxicillin and doxycycline which she finished the full course of.  She was also treated with prednisone for her COPD-has had significant improvement in her breathing but we discussed can take some time to fully recover from pneumonia - She has excellent questions about prevention of future flares - Strongly encourage smoking cessation - Discussed Prevnar 20 when she is feeling better - Discussed exercise plan-she is interested in the prep program through the [facility] and referral was made -trip to [place] upcoming previously planned-I strongly recommended against travel and specifically plane flights-I wrote a letter in support for her of this plan -Needs repeat chest x-ray in 1 month-this was ordered today and expressed importance of following through with this-I also placed a reminder to myself to check and make sure this was completed within 6 weeks

## 2022-07-03 ENCOUNTER — Ambulatory Visit: Payer: Medicare PPO | Admitting: Neurology

## 2022-07-03 ENCOUNTER — Encounter: Payer: Self-pay | Admitting: Neurology

## 2022-07-03 ENCOUNTER — Other Ambulatory Visit: Payer: Self-pay

## 2022-07-03 ENCOUNTER — Other Ambulatory Visit: Payer: Self-pay | Admitting: Family Medicine

## 2022-07-03 ENCOUNTER — Other Ambulatory Visit (HOSPITAL_COMMUNITY): Payer: Self-pay

## 2022-07-03 VITALS — BP 109/69 | HR 68 | Ht 67.0 in | Wt 146.0 lb

## 2022-07-03 DIAGNOSIS — G43009 Migraine without aura, not intractable, without status migrainosus: Secondary | ICD-10-CM

## 2022-07-03 MED ORDER — VITAMIN D (ERGOCALCIFEROL) 1.25 MG (50000 UNIT) PO CAPS
50000.0000 [IU] | ORAL_CAPSULE | ORAL | 1 refills | Status: DC
  Filled 2022-07-03: qty 13, 90d supply, fill #0
  Filled 2022-09-30: qty 13, 90d supply, fill #1

## 2022-07-03 MED ORDER — EMGALITY 120 MG/ML ~~LOC~~ SOAJ
120.0000 mg | SUBCUTANEOUS | 4 refills | Status: DC
  Filled 2022-07-03 – 2022-08-06 (×2): qty 3, 90d supply, fill #0
  Filled 2022-12-05: qty 3, 90d supply, fill #1
  Filled 2023-03-12: qty 3, 90d supply, fill #2
  Filled 2023-06-08: qty 3, 90d supply, fill #3

## 2022-07-03 NOTE — Progress Notes (Signed)
PATIENT: Suzanne [name] DOB: 10-Jun-1950  REASON FOR VISIT: follow up for migraines  HISTORY FROM: patient PRIMARY NEUROLOGIST: Dr. Lucia Gaskins   HISTORY OF PRESENT ILLNESS: Today 07/03/22  Had ONO that was unremarkable staying above 92% most of the night. Was admitted for PNA a few days ago. Migraines doing overall well. Emgality has helped immensely. Last Botox was in August.2023.  tried nurtec for rescue and prevention-wasn't helpful. Ubrelvy didn't help. Using Imitrex, Fioricet right now. Right now, on average 4-5 a month. Usually comes 4 days in a row. Her frequency is reduced enough that she doesn't worry about it.   12/12/2021: The botox and the emgality is working great. She may even stop the botox. She only gets 4 migraine days a month. Side effects to triptans, imitrex and maxalt. Will get her ubrelvy and nurtec samples and she can try those. She is going to try stopping the botox. Refilled her emgality. Gave her ubrelvy and nurtec samples. She has cut back on the fioricet. Weather is a trigger. Still having headache days but not migraines. She has been tested and does not have OSA. She has 10 total headache days in a month. She has 3-5am headaches mostly as her headaches not her typical migraines. Cut back on fioricet only with bad headaches 2x a week as needed. She also uses imitrex.   06/05/21 SS: Suzanne [name] here today for follow-up.  Last had Botox 04/09/21 with Shanda Bumps. Weather change and storms are triggers for her. Takes Fioricet almost daily. April was bad month, had URI, negative for COVID, lasted 3 weeks. Did prednisone taper for cough.  Headache days: 20-25 of those 15-20 used to be migraines before Botox, now migraines are 2-4 with Botox. Imitrex works for acute migraine. Knows coming on with left nasal drainage, localize to left temple, rest are mild to moderate. Uses Fioricet for headache that wakes her up at night, by the morning will be migraine. Was put on CPAP for Afib,  oxygen dropped to 87%, used CPAP for 2 years didn't help the headaches much. This has been her norm. Has strong family history of migraines.   Tried and failed: Topamax, nortriptyline, amitriptyline, Effexor, propranolol, gabapentin, Nurtec, Ubrelvy  Update 06/04/2021 SS: Suzanne [name] is a 72 year old female with history of migraine headache.  She remains on Botox therapy, last was in Feb 2022. Has decided to remain on Botox. Last summer thinks she had bad batch of generic Imitrex. The last few batches were fine. Takes an hour to provide relief. With botox pain level is a lot less, stays around 2/10 at it's worst. Next appointment is end of this month. Is going to [place], in June. On average, at least 9 migraines a month, more like 15 a month. Keeps Fioricet for morning headaches, when waking, has been for 30 years. Works well with heating pad. No changes to medical history.  Here today for evaluation unaccompanied.  Update 06/01/19 SS: Suzanne [name] is a 72 year old female with history of migraine headache.  She remains on Botox therapy. She was last given Botox May 19, 2019. She has historically benefited well from Botox. However, over the last year, questions if the benefit may be wearing off. She presents today, reporting a 6-day headache, currently 3/10. She is usually prescribed Fioricet, but was given less recently and has ran out of the medication. She has been taking Imitrex, but this only works for about 12 hours, has been taking for the last 4 days. She is  not able to take NSAID as she is on Eliquis. She is hesitant to use steroids, had a left knee replacement in November. She has Phenergan to take if needed. Of lately, she has had an increase in headache, likely as she just received Botox, along with stress related to the loss of her sister, and undergoing a remodel project at home. She presents today reporting her typical migraine pattern, she presents for follow-up  unaccompanied.  HISTORY 06/01/2018 Dr. Anne Hahn: Suzanne [name] is a 72 year old right-handed white female with history of migraine headache.  The patient is on Botox therapy, she has gained good improvement with the headaches.  She still has some occasional headaches that she requires Imitrex for, but her headaches never become extremely severe, they may get to a level of 2 through 4, but never more severe than that.  At times she may have nausea, she no longer has Phenergan to take.  The patient overall is pleased with her current therapy.  The Imitrex is still effective.  She has Fioricet to take as well if needed.  She is due for her next Botox injection in June 2020.   REVIEW OF SYSTEMS: Out of a complete 14 system review of symptoms, the patient complains only of the following symptoms, and all other reviewed systems are negative.  See HPI  ALLERGIES: Allergies  Allergen Reactions   Dilaudid [Hydromorphone Hcl] Nausea And Vomiting   Fentanyl Nausea And Vomiting, Other (See Comments) and Nausea Only   Ciprofloxacin Rash   Erythromycin Rash   Percocet [Oxycodone-Acetaminophen] Nausea And Vomiting    HOME MEDICATIONS: Outpatient Medications Prior to Visit  Medication Sig Dispense Refill   acetaminophen (TYLENOL) 325 MG tablet Take 325 mg 2 (two) times daily as needed by mouth (pain).      albuterol (VENTOLIN HFA) 108 (90 Base) MCG/ACT inhaler INHALE 2 PUFFS INTO THE LUNGS EVERY 6 HOURS AS NEEDED FOR WHEEZING OR SHORTNESS OF BREATH. 6.7 g 2   ALPRAZolam (XANAX) 0.5 MG tablet Take 1 tablet (0.5 mg total) by mouth 2 (two) times daily as needed for anxiety. 60 tablet 2   apixaban (ELIQUIS) 5 MG TABS tablet Take 1 tablet (5 mg total) by mouth 2 (two) times daily. 180 tablet 1   buPROPion (WELLBUTRIN XL) 300 MG 24 hr tablet Take 1 tablet by mouth daily (Patient taking differently: Take 300 mg by mouth in the morning.) 90 tablet 1   butalbital-acetaminophen-caffeine (FIORICET) 50-325-40 MG  tablet Take 1 tablet by mouth every 6 (six) hours as needed. For headache (Patient taking differently: Take 1 tablet by mouth every 6 (six) hours as needed for headache.) 30 tablet 0   dorzolamide-timolol (COSOPT) 2-0.5 % ophthalmic solution PLACE 1 DROP IN BOTH EYES TWO TIMES DAILY (Patient taking differently: Place 1 drop into both eyes 2 (two) times daily.) 20 mL 4   Probiotic Product (ALIGN) 4 MG CAPS Take 4 mg by mouth See admin instructions. Take 4 mg by mouth once a day when taking antibiotics     promethazine (PHENERGAN) 25 MG tablet Take 1 tablet (25 mg total) by mouth every 6 (six) hours as needed for nausea or vomiting. 20 tablet 1   REFRESH TEARS PF 0.5-0.9 % SOLN Place 1 drop into both eyes 3 (three) times daily as needed (for dryness).     rosuvastatin (CRESTOR) 5 MG tablet Take 1 tablet (5 mg total) by mouth once a week. (Patient taking differently: Take 5 mg by mouth every Saturday.) 13 tablet 3  senna (SENOKOT) 8.6 MG tablet Take 1 tablet by mouth daily as needed for constipation.     sertraline (ZOLOFT) 100 MG tablet Take 1 tablet (100 mg total) by mouth daily. 90 tablet 1   Simethicone (GAS-X PO) Take 2 tablets daily as needed by mouth (gas).     sodium chloride (OCEAN) 0.65 % SOLN nasal spray Place 1 spray as needed into both nostrils for congestion.     SUMAtriptan (IMITREX) 100 MG tablet TAKE 1 TABLET BY MOUTH AT THE ONSET OF A HEADACHE DAILY AS NEEDED (Patient taking differently: Take 100 mg by mouth See admin instructions. TAKE 100 MG BY MOUTH AT THE ONSET OF A HEADACHE DAILY AS NEEDED) 27 tablet 0   Galcanezumab-gnlm (EMGALITY) 120 MG/ML SOAJ Inject 120 mg as directed every 30 days. 3 mL 4   Ubrogepant (UBRELVY) 100 MG TABS Take 100 mg by mouth every 2 (two) hours as needed. Maximum 200mg  a day. 4 tablet 0   No facility-administered medications prior to visit.    PAST MEDICAL HISTORY: Past Medical History:  Diagnosis Date   AICD (automatic cardioverter/defibrillator)  present    loop recorder   Anxiety    Anxiety state 12/14/2018   Asthma    Atrial fibrillation (HCC)    s/p ablations   Atrial flutter (HCC)    COPD (chronic obstructive pulmonary disease) (HCC)    COVID    2022 and 2023   Degenerative arthritis    "maybe in my left knee" (12/16/2016)   Depression    Dysrhythmia 2010   A-fib   Frozen shoulder    hx bilaterally; "had to go thru PT; no problem w/them since I learned exercises" (12/16/2016)   GERD (gastroesophageal reflux disease)    Glaucoma    H/O: rheumatic fever 1957   High cholesterol    Migraine    "getting botox now; now only 2-3/month" (12/16/2016)   PONV (postoperative nausea and vomiting)    with Fentanyl,  Dilaudid &  Propofol (12/16/2016)    PAST SURGICAL HISTORY: Past Surgical History:  Procedure Laterality Date   ABDOMINAL HYSTERECTOMY  1990   right ovary removed   APPENDECTOMY  1990   ATRIAL FIBRILLATION ABLATION N/A 12/16/2016   Procedure: ATRIAL FIBRILLATION ABLATION;  Surgeon: Hillis Range, MD;  [facility];  Service: Cardiovascular;  Laterality: N/A;   CATARACT EXTRACTION, BILATERAL     CTI ablation  04/2009   ESOPHAGOGASTRODUODENOSCOPY (EGD) WITH PROPOFOL N/A 01/06/2013   Procedure: ESOPHAGOGASTRODUODENOSCOPY (EGD) WITH PROPOFOL;  Surgeon: Florencia Reasons, MD;  Location: [facility];  Service: Endoscopy;  Laterality: N/A;   implantable loop recorder removal  11/05/2020   MDT LINQ removed by Dr Johney Frame   KNEE ARTHROSCOPY Bilateral 1999-2006   KNEE SURGERY Bilateral [id number],9604   "open; for congenital deformity"   LOOP RECORDER INSERTION N/A 10/02/2016   Procedure: LOOP RECORDER INSERTION;  Surgeon: Hillis Range, MD;  [facility];  Service: Cardiovascular;  Laterality: N/A;   PERIPHERAL VASCULAR INTERVENTION  2/11  & 6/11   RHINOPLASTY  1965   TONSILLECTOMY  as child   TOOTH EXTRACTION  2009   2 teeth extractted   TOTAL KNEE ARTHROPLASTY Left 12/27/2018    Procedure: TOTAL KNEE ARTHROPLASTY;  Surgeon: Salvatore Marvel, MD;  Location: [facility];  Service: Orthopedics;  Laterality: Left;    FAMILY HISTORY: Family History  Problem Relation Age of Onset   Cancer Mother        renal cell  Migraines Mother    Dementia Father    Heart disease Father        mid 80s cabg   Other Brother        died 80- out for run- no autopsy   Hyperlipidemia Brother        doesnt tolerate statin- now 23   Breast cancer Neg Hx     SOCIAL HISTORY: Social History   Socioeconomic History   Marital status: Married    Spouse name: Not on file   Number of children: 0   Years of education: 16   Highest education level: Master's degree (e.g., MA, MS, MEng, MEd, MSW, MBA)  Occupational History   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    Employer: [employer]   Occupation: CLINICAL NURSE CARE    Employer: [employer]  Tobacco Use   Smoking status: Every Day    Packs/day: 0.25    Years: 50.00    Additional pack years: 0.00    Total pack years: 12.50    Types: Cigarettes   Smokeless tobacco: Never   Tobacco comments:    Discussed reduce to quit, gave card with resources to help in the community  Vaping Use   Vaping Use: Never used  Substance and Sexual Activity   Alcohol use: Not Currently    Comment: 12/16/2016 "1-2 glasses of wine per year"   Drug use: No   Sexual activity: Not Currently  Other Topics Concern   Not on file  Social History Narrative   Lives at home w/ husband and  dog(s)   Married 1984. No children.    Retired Charity fundraiser      Enjoys reading- history, politics, classics, etc   Caffeine: 1 cup/day   Social Determinants of Corporate investment banker Strain: Low Risk  (06/28/2022)   Overall Financial Resource Strain (CARDIA)    Difficulty of Paying Living Expenses: Not hard at all  Food Insecurity: No Food Insecurity (06/28/2022)   Hunger Vital Sign    Worried About Running Out of Food in the Last Year: Never true    Ran Out of  Food in the Last Year: Never true  Transportation Needs: No Transportation Needs (06/28/2022)   PRAPARE - Administrator, Civil Service (Medical): No    Lack of Transportation (Non-Medical): No  Physical Activity: Insufficiently Active (06/28/2022)   Exercise Vital Sign    Days of Exercise per Week: 1 day    Minutes of Exercise per Session: 10 min  Stress: Stress Concern Present (06/28/2022)   [employer] of Occupational Health - Occupational Stress Questionnaire    Feeling of Stress : To some extent  Social Connections: Socially Integrated (06/28/2022)   Social Connection and Isolation Panel [NHANES]    Frequency of Communication with Friends and Family: More than three times a week    Frequency of Social Gatherings with Friends and Family: Twice a week    Attends Religious Services: More than 4 times per year    Active Member of [employer] or Organizations: Yes    Attends Banker Meetings: More than 4 times per year    Marital Status: Married  Catering manager Violence: Not At Risk (06/24/2022)   Humiliation, Afraid, Rape, and Kick questionnaire    Fear of Current or Ex-Partner: No    Emotionally Abused: No    Physically Abused: No    Sexually Abused: No   PHYSICAL EXAM  Vitals:   07/03/22  1321  BP: 109/69  Pulse: 68  Weight: 146 lb (66.2 kg)  Height: 5\' 7"  (1.702 m)   Body mass index is 22.87 kg/m.  Generalized: Well developed, in no acute distress   Neurological examination  Mentation: Alert oriented to time, place, history taking. Follows all commands speech and language fluent Cranial nerve II-XII: Pupils were equal round reactive to light. Extraocular movements were full, visual field were full on confrontational test. Facial sensation and strength were normal. Head turning and shoulder shrug  were normal and symmetric. Motor: The motor testing reveals 5 over 5 strength of all 4 extremities. Good symmetric motor tone is noted throughout.   Sensory: Sensory testing is intact to soft touch on all 4 extremities. No evidence of extinction is noted.  Coordination: Cerebellar testing reveals good finger-nose-finger and heel-to-shin bilaterally.  Gait and station: Gait is normal.  Reflexes: Deep tendon reflexes are symmetric but slightly decreased at the knees  DIAGNOSTIC DATA (LABS, IMAGING, TESTING) - I reviewed patient records, labs, notes, testing and imaging myself where available.  Lab Results  Component Value Date   WBC 9.4 07/02/2022   HGB 12.8 07/02/2022   HCT 39.3 07/02/2022   MCV 94.3 07/02/2022   PLT 327.0 07/02/2022      Component Value Date/Time   NA 135 07/02/2022 1257   NA 141 12/05/2016 1407   K 4.5 07/02/2022 1257   CL 97 07/02/2022 1257   CO2 31 07/02/2022 1257   GLUCOSE 87 07/02/2022 1257   BUN 23 07/02/2022 1257   BUN 11 12/05/2016 1407   CREATININE 0.82 07/02/2022 1257   CREATININE 0.75 10/20/2019 1019   CALCIUM 9.0 07/02/2022 1257   PROT 6.0 07/02/2022 1257   ALBUMIN 3.6 07/02/2022 1257   AST 17 07/02/2022 1257   ALT 17 07/02/2022 1257   ALKPHOS 68 07/02/2022 1257   BILITOT 0.4 07/02/2022 1257   GFRNONAA >60 06/26/2022 0518   GFRNONAA 81 10/20/2019 1019   GFRAA 94 10/20/2019 1019   Lab Results  Component Value Date   CHOL 264 (H) 08/01/2021   HDL 71.90 08/01/2021   LDLCALC 171 (H) 08/01/2021   TRIG 107.0 08/01/2021   CHOLHDL 4 08/01/2021   No results found for: "HGBA1C" No results found for: "VITAMINB12" Lab Results  Component Value Date   TSH 2.755 12/02/2013   ASSESSMENT AND PLAN 72 y.o. year old female  has a past medical history of AICD (automatic cardioverter/defibrillator) present, Anxiety, Anxiety state (12/14/2018), Asthma, Atrial fibrillation (HCC), Atrial flutter (HCC), COPD (chronic obstructive pulmonary disease) (HCC), COVID, Degenerative arthritis, Depression, Dysrhythmia (2010), Frozen shoulder, GERD (gastroesophageal reflux disease), Glaucoma, H/O: rheumatic fever  (1957), High cholesterol, Migraine, and PONV (postoperative nausea and vomiting). here with:  1. Chronic migraine headache  -Qu[place] is doing very well with current regimen, right now 4-5 migraines a month  -Continue Emgality for migraine prevention  -Continue Imitrex, Fioricet as needed for acute migraine, she is being very careful not to overuse Fioricet, she has done really well to reduce her frequency  -Holding off on Botox since Emgality is doing such a great job -Tried and failed: Topamax, nortriptyline, amitriptyline, Effexor, propranolol, gabapentin, Nurtec, Ubrelvy, Botox -Follow-up in 1 year or sooner if needed  Margie Ege, Edrick Oh, DNP 07/03/2022, 1:52 PM [facility] [address] [place], [place] [address] [phone]

## 2022-07-04 ENCOUNTER — Other Ambulatory Visit (HOSPITAL_COMMUNITY): Payer: Self-pay

## 2022-07-14 ENCOUNTER — Encounter: Payer: Self-pay | Admitting: Family Medicine

## 2022-07-23 ENCOUNTER — Telehealth: Payer: Self-pay

## 2022-07-23 NOTE — Telephone Encounter (Signed)
Called to confirm participation in next PREP class at [facility] on July 1; she is out of town next week, assessment visit will be done after first class on July 1.

## 2022-08-04 ENCOUNTER — Ambulatory Visit (INDEPENDENT_AMBULATORY_CARE_PROVIDER_SITE_OTHER)
Admission: RE | Admit: 2022-08-04 | Discharge: 2022-08-04 | Disposition: A | Discharge status: Home / Self Care | Payer: Medicare PPO | Source: Ambulatory Visit | Attending: Family Medicine | Admitting: Family Medicine

## 2022-08-04 DIAGNOSIS — J189 Pneumonia, unspecified organism: Secondary | ICD-10-CM

## 2022-08-04 NOTE — Progress Notes (Signed)
[facility] PREP Weekly Session  Patient Details  Name: Suzanne [name] MRN: [id number] Date of Birth: 11-19-1950 Age: 72 y.o. PCP: Shelva Majestic, MD  Vitals:   08/04/22 1432  BP: 120/60  Pulse: 66  SpO2: 95%  Weight: 143 lb 3.2 oz (65 kg)     [facility] Weekly seesion - 08/04/22 1400       Weekly Session   Topic Discussed Importance of resistance training;Goal setting and welcome to the program   Introductions, review of notebook, tour of facility   Classes attended to date 1             [name] 08/04/2022, 2:35 PM  [facility] PREP Evaluation  Patient Details  Name: Suzanne [name] MRN: [id number] Date of Birth: 07/02/1950 Age: 72 y.o. PCP: Shelva Majestic, MD  Vitals:   08/04/22 1432  BP: 120/60  Pulse: 66  SpO2: 95%  Weight: 143 lb 3.2 oz (65 kg)     [facility] Eval - 08/04/22 1400       [facility] "PREP" Location   [facility] "PREP" Location [name]      Referral    Referring Provider Hunter    Reason for referral Hypertension;Inactivity;Current Smoker    Program Start Date 08/04/22      Measurement   Waist Circumference 33 inches    Hip Circumference 39 inches    Body fat 39.3 percent      Information for Trainer   Goals --   Establish strength training program, increase stamina   Current Exercise none    Orthopedic Concerns --   L TKA .'20   Pertinent Medical History --   AFIB, s/p ablation X5; asthma/COPD; high cholesterol     Timed Up and Go (TUGS)   Timed Up and Go Low risk <9 seconds      Mobility and Daily Activities   I find it easy to walk up or down two or more flights of stairs. 3    I have no trouble taking out the trash. 3    I do housework such as vacuuming and dusting on my own without difficulty. 4    I can easily lift a gallon of milk (8lbs). 4    I can easily walk a mile. 3    I have no trouble reaching into high cupboards or reaching down to pick up something from the floor. 3    I do not have trouble doing out-door work such  as Loss adjuster, chartered, raking leaves, or gardening. 1      Mobility and Daily Activities   I feel younger than my age. 3    I feel independent. 3    I feel energetic. 3    I live an active life.  3    I feel strong. 1    I feel healthy. 3    I feel active as other people my age. 1      How fit and strong are you.   Fit and Strong Total Score 38            Past Medical History:  Diagnosis Date   AICD (automatic cardioverter/defibrillator) present    loop recorder   Anxiety    Anxiety state 12/14/2018   Asthma    Atrial fibrillation (HCC)    s/p ablations   Atrial flutter (HCC)    COPD (chronic obstructive pulmonary disease) (HCC)    COVID    2022 and 2023  Degenerative arthritis    "maybe in my left knee" (12/16/2016)   Depression    Dysrhythmia 2010   A-fib   Frozen shoulder    hx bilaterally; "had to go thru PT; no problem w/them since I learned exercises" (12/16/2016)   GERD (gastroesophageal reflux disease)    Glaucoma    H/O: rheumatic fever 1957   High cholesterol    Migraine    "getting botox now; now only 2-3/month" (12/16/2016)   PONV (postoperative nausea and vomiting)    with Fentanyl,  Dilaudid &  Propofol (12/16/2016)   Past Surgical History:  Procedure Laterality Date   ABDOMINAL HYSTERECTOMY  1990   right ovary removed   APPENDECTOMY  1990   ATRIAL FIBRILLATION ABLATION N/A 12/16/2016   Procedure: ATRIAL FIBRILLATION ABLATION;  Surgeon: Hillis Range, MD;  [facility];  Service: Cardiovascular;  Laterality: N/A;   CATARACT EXTRACTION, BILATERAL     CTI ablation  04/2009   ESOPHAGOGASTRODUODENOSCOPY (EGD) WITH PROPOFOL N/A 01/06/2013   Procedure: ESOPHAGOGASTRODUODENOSCOPY (EGD) WITH PROPOFOL;  Surgeon: Florencia Reasons, MD;  Location: [facility];  Service: Endoscopy;  Laterality: N/A;   implantable loop recorder removal  11/05/2020   MDT LINQ removed by Dr Johney Frame   KNEE ARTHROSCOPY Bilateral 1999-2006   KNEE SURGERY  Bilateral [id number]   "open; for congenital deformity"   LOOP RECORDER INSERTION N/A 10/02/2016   Procedure: LOOP RECORDER INSERTION;  Surgeon: Hillis Range, MD;  [facility];  Service: Cardiovascular;  Laterality: N/A;   PERIPHERAL VASCULAR INTERVENTION  2/11  & 6/11   RHINOPLASTY  1965   TONSILLECTOMY  as child   TOOTH EXTRACTION  2009   2 teeth extractted   TOTAL KNEE ARTHROPLASTY Left 12/27/2018   Procedure: TOTAL KNEE ARTHROPLASTY;  Surgeon: Salvatore Marvel, MD;  Location: [facility];  Service: Orthopedics;  Laterality: Left;   Social History   Tobacco Use  Smoking Status Every Day   Packs/day: 0.25   Years: 50.00   Additional pack years: 0.00   Total pack years: 12.50   Types: Cigarettes  Smokeless Tobacco Never  Tobacco Comments   Discussed reduce to quit, gave card with resources to help in the community  To begin PREP class today at [facility], every M/@ 12:30-1:45  Jashun Puertas B Thanya Cegielski 08/04/2022, 2:35 PM

## 2022-08-05 ENCOUNTER — Ambulatory Visit (INDEPENDENT_AMBULATORY_CARE_PROVIDER_SITE_OTHER): Payer: Medicare PPO | Admitting: Family Medicine

## 2022-08-05 ENCOUNTER — Encounter: Payer: Self-pay | Admitting: Family Medicine

## 2022-08-05 VITALS — BP 126/64 | HR 63 | Temp 97.3°F | Ht 67.0 in | Wt 143.4 lb

## 2022-08-05 DIAGNOSIS — Z87891 Personal history of nicotine dependence: Secondary | ICD-10-CM

## 2022-08-05 DIAGNOSIS — E559 Vitamin D deficiency, unspecified: Secondary | ICD-10-CM

## 2022-08-05 DIAGNOSIS — R932 Abnormal findings on diagnostic imaging of liver and biliary tract: Secondary | ICD-10-CM | POA: Diagnosis not present

## 2022-08-05 DIAGNOSIS — J441 Chronic obstructive pulmonary disease with (acute) exacerbation: Secondary | ICD-10-CM

## 2022-08-05 DIAGNOSIS — E7800 Pure hypercholesterolemia, unspecified: Secondary | ICD-10-CM

## 2022-08-05 DIAGNOSIS — I480 Paroxysmal atrial fibrillation: Secondary | ICD-10-CM | POA: Diagnosis not present

## 2022-08-05 DIAGNOSIS — I700 Atherosclerosis of aorta: Secondary | ICD-10-CM

## 2022-08-05 DIAGNOSIS — Z0000 Encounter for general adult medical examination without abnormal findings: Secondary | ICD-10-CM

## 2022-08-05 LAB — URINALYSIS, ROUTINE W REFLEX MICROSCOPIC
Bilirubin Urine: NEGATIVE
Hgb urine dipstick: NEGATIVE
Ketones, ur: NEGATIVE
Leukocytes,Ua: NEGATIVE
Nitrite: NEGATIVE
RBC / HPF: NONE SEEN (ref 0–?)
Specific Gravity, Urine: 1.01 (ref 1.000–1.030)
Total Protein, Urine: NEGATIVE
Urine Glucose: NEGATIVE
Urobilinogen, UA: 0.2 (ref 0.0–1.0)
WBC, UA: NONE SEEN (ref 0–?)
pH: 6.5 (ref 5.0–8.0)

## 2022-08-05 LAB — COMPREHENSIVE METABOLIC PANEL
ALT: 12 U/L (ref 0–35)
AST: 16 U/L (ref 0–37)
Albumin: 4.2 g/dL (ref 3.5–5.2)
Alkaline Phosphatase: 79 U/L (ref 39–117)
BUN: 14 mg/dL (ref 6–23)
CO2: 29 mEq/L (ref 19–32)
Calcium: 9.9 mg/dL (ref 8.4–10.5)
Chloride: 104 mEq/L (ref 96–112)
Creatinine, Ser: 0.76 mg/dL (ref 0.40–1.20)
GFR: 77.55 mL/min (ref >60.00)
Glucose, Bld: 86 mg/dL (ref 70–99)
Potassium: 4.9 mEq/L (ref 3.5–5.1)
Sodium: 140 mEq/L (ref 135–145)
Total Bilirubin: 0.5 mg/dL (ref 0.2–1.2)
Total Protein: 6.4 g/dL (ref 6.0–8.3)

## 2022-08-05 LAB — LIPID PANEL
Cholesterol: 224 mg/dL — ABNORMAL HIGH (ref 0–200)
HDL: 63.5 mg/dL (ref >39.00)
LDL Cholesterol: 134 mg/dL — ABNORMAL HIGH (ref 0–99)
NonHDL: 160.46
Total CHOL/HDL Ratio: 4
Triglycerides: 132 mg/dL (ref 0.0–149.0)
VLDL: 26.4 mg/dL (ref 0.0–40.0)

## 2022-08-05 LAB — CBC WITH DIFFERENTIAL/PLATELET
Basophils Absolute: 0.1 10*3/uL (ref 0.0–0.1)
Basophils Relative: 1.1 % (ref 0.0–3.0)
Eosinophils Absolute: 0.1 10*3/uL (ref 0.0–0.7)
Eosinophils Relative: 1.2 % (ref 0.0–5.0)
HCT: 40.2 % (ref 36.0–46.0)
Hemoglobin: 13.1 g/dL (ref 12.0–15.0)
Lymphocytes Relative: 39.6 % (ref 12.0–46.0)
Lymphs Abs: 2.4 10*3/uL (ref 0.7–4.0)
MCHC: 32.5 g/dL (ref 30.0–36.0)
MCV: 94.4 fl (ref 78.0–100.0)
Monocytes Absolute: 0.6 10*3/uL (ref 0.1–1.0)
Monocytes Relative: 9.3 % (ref 3.0–12.0)
Neutro Abs: 2.9 10*3/uL (ref 1.4–7.7)
Neutrophils Relative %: 48.8 % (ref 43.0–77.0)
Platelets: 159 10*3/uL (ref 150.0–400.0)
RBC: 4.26 Mil/uL (ref 3.87–5.11)
RDW: 14.5 % (ref 11.5–15.5)
WBC: 6 10*3/uL (ref 4.0–10.5)

## 2022-08-05 NOTE — Patient Instructions (Addendum)
Let us know if you get any COVID vaccines this fall or if you get shingrix shot  We will call you within two weeks about your referral for ultrasound of liver through [facility].  Their phone number is [phone].  Please call them if you have not heard in 1-2 weeks  Please stop by lab before you go If you have mychart- we will send your results within 3 business days of [place] receiving them.  If you do not have mychart- we will call you about results within 5 business days of [place] receiving them.  *please also note that you will see labs on mychart as soon as they post. I will later go in and write notes on them- will say "notes from Dr. Durene Cal"   Glad you are improving!   Recommended follow up: Return in about 1 year (around 08/05/2023) for physical or sooner if needed.Schedule b4 you leave.

## 2022-08-05 NOTE — Progress Notes (Signed)
Phone [phone]   Subjective:  Patient presents today for their annual physical. Chief complaint-noted.   See problem oriented charting- ROS- full  review of systems was completed and negative Per full ROS sheet completed by patient   The following were reviewed and entered/updated in epic: Past Medical History:  Diagnosis Date   AICD (automatic cardioverter/defibrillator) present    loop recorder   Allergy since young adulthood   seasonal pollen   Anxiety    Anxiety state 12/14/2018   Asthma    Atrial fibrillation (HCC)    s/p ablations   Atrial flutter (HCC)    Cataract surgeries 09/2017  11/2017   COPD (chronic obstructive pulmonary disease) (HCC)    COVID    2022 and 2023   Degenerative arthritis    "maybe in my left knee" (12/16/2016)   Depression    Dysrhythmia 2010   A-fib   Frozen shoulder    hx bilaterally; "had to go thru PT; no problem w/them since I learned exercises" (12/16/2016)   GERD (gastroesophageal reflux disease)    Glaucoma    H/O: rheumatic fever 1957   Heart murmur h/o rheumatic fever   High cholesterol    Migraine    "getting botox now; now only 2-3/month" (12/16/2016)   PONV (postoperative nausea and vomiting)    with Fentanyl,  Dilaudid &  Propofol (12/16/2016)   Patient Active Problem List   Diagnosis Date Noted   CAP (community acquired pneumonia) 06/23/2022    Priority: High   Paroxysmal atrial fibrillation (HCC) s/p ablation november 2018, 2011 previously 12/16/2016    Priority: High   Tobacco abuse 10/03/2013    Priority: High   COPD (chronic obstructive pulmonary disease) (HCC) 02/01/2009    Priority: High   Glaucoma 01/08/2018    Priority: Medium    Primary localized osteoarthritis of left knee 01/08/2018    Priority: Medium    Vitamin D deficiency 01/08/2018    Priority: Medium    High cholesterol     Priority: Medium    Intractable chronic migraine without aura 05/17/2014    Priority: Medium    Depression, major,  single episode, complete remission (HCC) 02/01/2009    Priority: Medium    Asthma 02/01/2009    Priority: Medium    Nasal injury 10/20/2016    Priority: Low   S/P ablation of atrial fibrillation 05/16/2013    Priority: Low   Atherosclerosis of aorta (HCC) 07/02/2022   Migraine without aura and without status migrainosus, not intractable 12/13/2021   Past Surgical History:  Procedure Laterality Date   ABDOMINAL HYSTERECTOMY  1990   right ovary removed   APPENDECTOMY  1990   ATRIAL FIBRILLATION ABLATION N/A 12/16/2016   Procedure: ATRIAL FIBRILLATION ABLATION;  Surgeon: Hillis Range, MD;  Location: [facility];  Service: Cardiovascular;  Laterality: N/A;   CATARACT EXTRACTION, BILATERAL     COSMETIC SURGERY  1967   rhinoplasty   CTI ablation  04/2009   ESOPHAGOGASTRODUODENOSCOPY (EGD) WITH PROPOFOL N/A 01/06/2013   Procedure: ESOPHAGOGASTRODUODENOSCOPY (EGD) WITH PROPOFOL;  Surgeon: Florencia Reasons, MD;  Location: [facility];  Service: Endoscopy;  Laterality: N/A;   EYE SURGERY  catarracts 2019   implantable loop recorder removal  11/05/2020   MDT LINQ removed by Dr Johney Frame   JOINT REPLACEMENT  November 2020   L knee   KNEE ARTHROSCOPY Bilateral 1999-2006   KNEE SURGERY Bilateral [id number]   "open; for congenital deformity"   LOOP RECORDER INSERTION N/A 10/02/2016  Procedure: LOOP RECORDER INSERTION;  Surgeon: Hillis Range, MD;  Location: [facility];  Service: Cardiovascular;  Laterality: N/A;   PERIPHERAL VASCULAR INTERVENTION  2/11  & 6/11   RHINOPLASTY  1965   TONSILLECTOMY  as child   TOOTH EXTRACTION  2009   2 teeth extractted   TOTAL KNEE ARTHROPLASTY Left 12/27/2018   Procedure: TOTAL KNEE ARTHROPLASTY;  Surgeon: Salvatore Marvel, MD;  Location: [facility];  Service: Orthopedics;  Laterality: Left;    Family History  Problem Relation Age of Onset   Cancer Mother        renal cell   Migraines Mother    Anxiety disorder Mother    Dementia Father     Heart disease Father        mid 23s cabg   Hyperlipidemia Father    Other Brother        died 68- out for run- no autopsy   Hyperlipidemia Brother        doesnt tolerate statin- now 58   Hyperlipidemia Brother    Breast cancer Neg Hx     Medications- reviewed and updated Current Outpatient Medications  Medication Sig Dispense Refill   acetaminophen (TYLENOL) 325 MG tablet Take 325 mg 2 (two) times daily as needed by mouth (pain).      albuterol (VENTOLIN HFA) 108 (90 Base) MCG/ACT inhaler INHALE 2 PUFFS INTO THE LUNGS EVERY 6 HOURS AS NEEDED FOR WHEEZING OR SHORTNESS OF BREATH. 6.7 g 2   ALPRAZolam (XANAX) 0.5 MG tablet Take 1 tablet (0.5 mg total) by mouth 2 (two) times daily as needed for anxiety. 60 tablet 2   apixaban (ELIQUIS) 5 MG TABS tablet Take 1 tablet (5 mg total) by mouth 2 (two) times daily. 180 tablet 1   buPROPion (WELLBUTRIN XL) 300 MG 24 hr tablet Take 1 tablet by mouth daily (Patient taking differently: Take 300 mg by mouth in the morning.) 90 tablet 1   butalbital-acetaminophen-caffeine (FIORICET) 50-325-40 MG tablet Take 1 tablet by mouth every 6 (six) hours as needed. For headache (Patient taking differently: Take 1 tablet by mouth every 6 (six) hours as needed for headache.) 30 tablet 0   dorzolamide-timolol (COSOPT) 2-0.5 % ophthalmic solution PLACE 1 DROP IN BOTH EYES TWO TIMES DAILY (Patient taking differently: Place 1 drop into both eyes 2 (two) times daily.) 20 mL 4   Galcanezumab-gnlm (EMGALITY) 120 MG/ML SOAJ Inject 120 mg as directed every 30 days. 3 mL 4   Probiotic Product (ALIGN) 4 MG CAPS Take 4 mg by mouth See admin instructions. Take 4 mg by mouth once a day when taking antibiotics     promethazine (PHENERGAN) 25 MG tablet Take 1 tablet (25 mg total) by mouth every 6 (six) hours as needed for nausea or vomiting. 20 tablet 1   REFRESH TEARS PF 0.5-0.9 % SOLN Place 1 drop into both eyes 3 (three) times daily as needed (for dryness).     rosuvastatin  (CRESTOR) 5 MG tablet Take 1 tablet (5 mg total) by mouth once a week. (Patient taking differently: Take 5 mg by mouth every Saturday.) 13 tablet 3   senna (SENOKOT) 8.6 MG tablet Take 1 tablet by mouth daily as needed for constipation.     sertraline (ZOLOFT) 100 MG tablet Take 1 tablet (100 mg total) by mouth daily. 90 tablet 1   Simethicone (GAS-X PO) Take 2 tablets daily as needed by mouth (gas).     sodium chloride (OCEAN) 0.65 % SOLN nasal  spray Place 1 spray as needed into both nostrils for congestion.     SUMAtriptan (IMITREX) 100 MG tablet TAKE 1 TABLET BY MOUTH AT THE ONSET OF A HEADACHE DAILY AS NEEDED (Patient taking differently: Take 100 mg by mouth See admin instructions. TAKE 100 MG BY MOUTH AT THE ONSET OF A HEADACHE DAILY AS NEEDED) 27 tablet 0   Vitamin D, Ergocalciferol, (DRISDOL) 1.25 MG (50000 UNIT) CAPS capsule Take 1 capsule (50,000 Units total) by mouth every 7 (seven) days. 13 capsule 1   No current facility-administered medications for this visit.    Allergies-reviewed and updated Allergies  Allergen Reactions   Dilaudid [Hydromorphone Hcl] Nausea And Vomiting   Fentanyl Nausea And Vomiting, Other (See Comments) and Nausea Only   Ciprofloxacin Rash   Erythromycin Rash   Percocet [Oxycodone-Acetaminophen] Nausea And Vomiting    Social History   Social History Narrative   Lives at home w/ husband and  dog(s)   Married 1984. No children.    Retired [employer] fundraiser      Enjoys reading- history, politics, classics, etc   Caffeine: 1 cup/day   Objective  Objective:  BP 126/64   Pulse 63   Temp (!) 97.3 F (36.3 C)   Ht 5\' 7"  (1.702 m)   Wt 143 lb 6.4 oz (65 kg)   SpO2 94%   BMI 22.46 kg/m  Gen: NAD, resting comfortably HEENT: Mucous membranes are moist. Oropharynx normal Neck: no thyromegaly CV: RRR no murmurs rubs or gallops Lungs: CTAB no crackles, wheeze, rhonchi Abdomen: soft/nontender/nondistended/normal bowel sounds. No rebound or guarding.  Ext: no  edema Skin: warm, dry Neuro: grossly normal, moves all extremities, PERRLA   Assessment and Plan   72 y.o. female presenting for annual physical.  Health Maintenance counseling: 1. Anticipatory guidance: Patient counseled regarding regular dental exams -q6 months, eye exams -quite regularly for glaucoma,  avoiding smoking and second hand smoke- see below , limiting alcohol to 1 beverage per day- rare sips as triggers migraines, no illicit drugs .   2. Risk factor reduction:  Advised patient of need for regular exercise and diet rich and fruits and vegetables to reduce risk of heart attack and stroke.  Exercise- started prep program yesterday to work on strength with [employer]- has noted improved strength.  Diet/weight management-slightly up 3 lbs from last year but still healthy weight and overall eats pretty healthy.  Wt Readings from Last 3 Encounters:  08/05/22 143 lb 6.4 oz (65 kg)  08/04/22 143 lb 3.2 oz (65 kg)  07/03/22 146 lb (66.2 kg)  3. Immunizations/screenings/ancillary studies- shingrix at pharmacy , consider fall COVID shot  - she plans on this Immunization History  Administered Date(s) Administered   Fluad Quad(high Dose 65+) 10/19/2020   Influenza, High Dose Seasonal PF 11/08/2013, 10/10/2016, 09/24/2018, 10/06/2019, 11/07/2021   Influenza,inj,quad, With Preservative 11/07/2014   Influenza-Unspecified 10/12/2016, 10/29/2017, 10/06/2019, 10/19/2020   PFIZER(Purple Top)SARS-COV-2 Vaccination 02/27/2019, 03/21/2019, 11/04/2019, 06/12/2020, 10/30/2020   Pneumococcal Conjugate-13 03/27/2016   Pneumococcal Polysaccharide-23 12/20/2013   Respiratory Syncytial Virus Vaccine,Recomb Aduvanted(Arexvy) 11/07/2021   Tdap 02/02/2013, 09/02/2020  4. Cervical cancer screening- prior with [facility] Dus but retired intermittently for GYN exams (undecided)- past formal age based recommendations 5. Breast cancer screening-  breast exam with GYN and  mammogram9/1/23 with 3d 6. Colon cancer screening - 08/14/2020 due to GI issues and good for 10 years or even being released. Dr. Matthias Hughs- now retired.  7. Skin cancer screening- follows with dermatology  as needed- no recent issues. advised regular sunscreen use. Denies worrisome, changing, or new skin lesions.  8. Birth control/STD check- only active with husband and postmenopausal 9. Osteoporosis screening at 28- osteoporosis noted- prior therapy was delayed with knee issues. She states at this point would not take medication even if worsened  - primary goal will be fall prevention, keeping vitamin D adequate, weight bearing exercise- walking regularly in long run plus lifting with prep program,quitting smoking 10. Smoking associated screening - current smoker- had 3 weeks off- doing 3-5 cigarettes a day. Does not want to quit- enjoys smoking. Still in ct lung cancer screening program and I will check UA. Encouraged cessation- she declines.   Status of chronic or acute concerns   #recent pneumonia - about 95% recovered per her introspection- thrilled she is doing so well!   # COPD/asthma/allergies S: Maintenance medications: has tried twice without help. Sparing albuterol helps. No oxygen at home  A/P: copd overall stable- continue current medications - encouraged cessatoin  # Atrial fibrillation- sees Dr. Estill Dooms S: Rate controlled with no medication.  Has Cardizem on hand in case goes into atrial fibrillation Anticoagulated with Eliquis 5 mg twice daily  A/P: appropriately anticoagulated and rate controlled (without medications)- continue current medicine   % Depression/anxiety -follows with psychiatric nurse practitioner S: Medication: Sertraline 100 mg, bupropion 300Mg , Xanax 0.5Mg  A/P: remains stable/full remission- continue current medications   #Vitamin D deficiency S: Medication: high dose right now not even 6 doses yet- plans 2000 units when done -Patient reports aching in her  toes on vitamin D in the past Last vitamin D: 21.09 November 2018 -does get some tingling in toes on this- possible underlying neuropathy- seemed to first notice with high dose vitamin D in past Last vitamin D Lab Results  Component Value Date   VD25OH 13.16 (L) 07/02/2022  A/P: likely improving righ tnow- check next visit   #hyperlipidemia/aortic atherosclerosis-not accounting for aortic atherosclerosis 10-year ASCVD risk above 15% in 2020 S: Brother with significant myalgias on statins and patient is concerned about trialing-she is more interested in fish oil or Vascepa Medication:rosuvastatin 5 mg  once a week- seems to be tolerating even though brother had significant myalgias Lab Results  Component Value Date   CHOL 264 (H) 08/01/2021   HDL 71.90 08/01/2021   LDLCALC 171 (H) 08/01/2021   TRIG 107.0 08/01/2021   CHOLHDL 4 08/01/2021   A/P: depending on LDL- may try every other day as long as she tolerates it with goal of daily in long run   % Headaches-follows closely with Dr. Anne Hahn. on emgality- saw Maralyn Sago recently and overall doing welll.  fiorcet or sumatriptan prn. botox -Phenergan for nausea portion   #Urgency and diarrhea at times.  No blood in stool or melena.  Continued issues since   #liver nodularity noted on ct lung cancer screening- prior US 10/26/19 normal- plan to reevaluate with yearly scan- continue to follow up  -also will order [place] with elasto graphy for more info. Does not drink much alcohol and never has- some wine but always well under 7 a week Lab Results  Component Value Date   ALT 17 07/02/2022   AST 17 07/02/2022   ALKPHOS 68 07/02/2022   BILITOT 0.4 07/02/2022   Recommended follow up: Return in about 1 year (around 08/05/2023) for physical or sooner if needed.Schedule b4 you leave. Future Appointments  Date Time Provider Department Center  10/21/2022 10:30 AM Regan Lemming, MD [facility]  07/09/2023  1:45 PM Glean Salvo, NP [facility]  None   Lab/Order associations: fasting   ICD-10-CM   1. Routine general medical examination at a health care facility  Z00.00     2. High cholesterol  E78.00 Comprehensive metabolic panel    CBC with Differential/Platelet    Lipid panel    3. Vitamin D deficiency  E55.9     4. Chronic obstructive pulmonary disease with acute exacerbation (HCC)  J44.1     5. Paroxysmal atrial fibrillation [facility]) s/p ablation november 2018, 2011 previously  I48.0     6. Atherosclerosis of aorta (HCC)  I70.0     7. Abnormal CT of liver  R93.2 US ABDOMEN COMPLETE W/ELASTOGRAPHY    8. Former smoker  Z87.891 Urinalysis, Routine w reflex microscopic      No orders of the defined types were placed in this encounter.   Return precautions advised.  Tana Conch, MD

## 2022-08-06 ENCOUNTER — Other Ambulatory Visit: Payer: Self-pay | Admitting: Family Medicine

## 2022-08-06 ENCOUNTER — Encounter: Payer: Self-pay | Admitting: Family Medicine

## 2022-08-06 ENCOUNTER — Other Ambulatory Visit (HOSPITAL_COMMUNITY): Payer: Self-pay

## 2022-08-08 ENCOUNTER — Other Ambulatory Visit (HOSPITAL_COMMUNITY): Payer: Self-pay

## 2022-08-08 ENCOUNTER — Other Ambulatory Visit: Payer: Self-pay

## 2022-08-08 MED ORDER — ROSUVASTATIN CALCIUM 5 MG PO TABS
5.0000 mg | ORAL_TABLET | ORAL | 3 refills | Status: DC
  Filled 2022-08-08: qty 13, 90d supply, fill #0
  Filled 2022-11-11: qty 13, 90d supply, fill #1
  Filled 2023-02-05: qty 13, 90d supply, fill #2
  Filled 2023-05-13: qty 13, 90d supply, fill #3

## 2022-08-11 NOTE — Progress Notes (Signed)
[facility] PREP Weekly Session  Patient Details  Name: Suzanne [name] MRN: [id number] Date of Birth: January 10, 1951 Age: 72 y.o. PCP: Shelva Majestic, MD  Vitals:   08/11/22 1339  Weight: 143 lb 1.6 oz (64.9 kg)     [facility] Weekly seesion - 08/11/22 1300       [facility] "PREP" Location   [facility] "PREP" Location [name]      Weekly Session   Topic Discussed Importance of resistance training;Other ways to be active   Goals: work up to 150 minutes/wk of cardio; strength training 2-3 times/wk for 20-40 minutes; sitting no more than 30 minutes   Classes attended to date 3             Suzanne [name] 08/11/2022, 1:41 PM

## 2022-08-18 NOTE — Progress Notes (Signed)
[facility] PREP Weekly Session  Patient Details  Name: Suzanne [name] MRN: [id number] Date of Birth: 1950/05/25 Age: 72 y.o. PCP: Shelva Majestic, MD  Vitals:   08/18/22 1342  Weight: 143 lb 6.4 oz (65 kg)     [facility] Weekly seesion - 08/18/22 1300       [facility] "PREP" Location   [facility] "PREP" Location [name]      Weekly Session   Topic Discussed Healthy eating tips   Eat the rainbow of colors; introduced YUKA app; foods to reduce, foods to increase   Minutes exercised this week 130 minutes    Classes attended to date 5             Suzanne [name] [name] 08/18/2022, 1:43 PM

## 2022-08-25 NOTE — Progress Notes (Signed)
[facility] PREP Weekly Session  Patient Details  Name: Suzanne [name] MRN: [id number] Date of Birth: Sep 12, 1950 Age: 72 y.o. PCP: Shelva Majestic, MD  Vitals:   08/25/22 1349  Weight: 144 lb 9.6 oz (65.6 kg)     [facility] Weekly seesion - 08/25/22 1300       [facility] "PREP" Location   [facility] "PREP" Location [name]      Weekly Session   Topic Discussed Health habits   Sugar demo; water: 1/2 body wt in oz   Minutes exercised this week 165 minutes    Classes attended to date 7             Wyatt Thorstenson B Scotti Kosta 08/25/2022, 1:50 PM

## 2022-08-26 ENCOUNTER — Ambulatory Visit
Admission: RE | Admit: 2022-08-26 | Discharge: 2022-08-26 | Disposition: A | Discharge status: Home / Self Care | Payer: Medicare PPO | Source: Ambulatory Visit | Attending: Family Medicine | Admitting: Family Medicine

## 2022-08-26 DIAGNOSIS — K769 Liver disease, unspecified: Secondary | ICD-10-CM | POA: Diagnosis not present

## 2022-08-26 DIAGNOSIS — R932 Abnormal findings on diagnostic imaging of liver and biliary tract: Secondary | ICD-10-CM

## 2022-09-08 NOTE — Progress Notes (Signed)
[facility] PREP Weekly Session  Patient Details  Name: Suzanne [name] MRN: [id number] Date of Birth: 12-15-1950 Age: 72 y.o. PCP: Shelva Majestic, MD  Vitals:   09/08/22 1346  Weight: 145 lb 9.6 oz (66 kg)     [facility] Weekly seesion - 09/08/22 1300       [facility] "PREP" Location   [facility] "PREP" Location [name]      Weekly Session   Topic Discussed Stress management and problem solving   Importance of sleep; finger tip mudra practice to manage stress   Classes attended to date 39              B  09/08/2022, 1:47 PM

## 2022-09-11 ENCOUNTER — Other Ambulatory Visit: Payer: Self-pay | Admitting: Family Medicine

## 2022-09-11 DIAGNOSIS — Z1231 Encounter for screening mammogram for malignant neoplasm of breast: Secondary | ICD-10-CM

## 2022-09-15 ENCOUNTER — Other Ambulatory Visit: Payer: Self-pay | Admitting: Neurology

## 2022-09-15 ENCOUNTER — Other Ambulatory Visit (HOSPITAL_COMMUNITY): Payer: Self-pay

## 2022-09-15 ENCOUNTER — Other Ambulatory Visit: Payer: Self-pay

## 2022-09-15 MED ORDER — SUMATRIPTAN SUCCINATE 100 MG PO TABS
ORAL_TABLET | ORAL | 0 refills | Status: DC
  Filled 2022-09-15: qty 27, 90d supply, fill #0

## 2022-09-15 MED ORDER — BUTALBITAL-APAP-CAFFEINE 50-325-40 MG PO TABS
1.0000 | ORAL_TABLET | Freq: Four times a day (QID) | ORAL | 0 refills | Status: DC | PRN
  Filled 2022-09-15: qty 30, 30d supply, fill #0

## 2022-09-15 NOTE — Telephone Encounter (Signed)
Dispenses   Dispensed Days Supply Quantity Provider Pharmacy  butalbital-acetaminophen-caffeine (FIORICET) [phone] MG tablet 05/20/2022 8 30 tablet Glean Salvo, NP [facility]  butalbital-acetaminophen-caffeine (FIORICET) 50-325-40 MG tablet 01/13/2022 8 30 tablet Glean Salvo, NP [place] [facility]    Last visit 07/03/2022 Next visit 07/09/2023

## 2022-09-16 ENCOUNTER — Other Ambulatory Visit (HOSPITAL_COMMUNITY): Payer: Self-pay

## 2022-09-22 NOTE — Progress Notes (Signed)
[facility] PREP Weekly Session  Patient Details  Name: Suzanne [name] MRN: [id number] Date of Birth: Dec 13, 1950 Age: 72 y.o. PCP: Shelva Majestic, MD  Vitals:   09/22/22 1345  Weight: 144 lb 4.8 oz (65.5 kg)     [facility] Weekly seesion - 09/22/22 1300       [facility] "PREP" Location   [facility] "PREP" Location [name]      Weekly Session   Topic Discussed Expectations and non-scale victories   Halfway thru program; revisit, restate, review goals; staying positive   Classes attended to date 95             Shantel Wesely B Damarcus Reggio 09/22/2022, 1:47 PM

## 2022-09-25 DIAGNOSIS — H401232 Low-tension glaucoma, bilateral, moderate stage: Secondary | ICD-10-CM | POA: Diagnosis not present

## 2022-09-29 NOTE — Progress Notes (Signed)
[facility] PREP Weekly Session  Patient Details  Name: Suzanne [name] MRN: [id number] Date of Birth: Sep 05, 1950 Age: 72 y.o. PCP: Shelva Majestic, MD  Vitals:   09/29/22 1342  Weight: 145 lb (65.8 kg)     [facility] Weekly seesion - 09/29/22 1300       [facility] "PREP" Location   [facility] "PREP" Location [name]      Weekly Session   Topic Discussed Other   Portion control; visualize your portion size demo; review of [employer] food label.   Minutes exercised this week 180 minutes    Classes attended to date 38             Khalessi Blough B Hadley Detloff 09/29/2022, 1:42 PM

## 2022-09-30 ENCOUNTER — Other Ambulatory Visit: Payer: Self-pay

## 2022-10-02 ENCOUNTER — Other Ambulatory Visit (HOSPITAL_COMMUNITY): Payer: Self-pay

## 2022-10-02 DIAGNOSIS — F411 Generalized anxiety disorder: Secondary | ICD-10-CM | POA: Diagnosis not present

## 2022-10-02 DIAGNOSIS — F331 Major depressive disorder, recurrent, moderate: Secondary | ICD-10-CM | POA: Diagnosis not present

## 2022-10-07 ENCOUNTER — Other Ambulatory Visit (HOSPITAL_COMMUNITY): Payer: Self-pay

## 2022-10-07 ENCOUNTER — Other Ambulatory Visit (HOSPITAL_BASED_OUTPATIENT_CLINIC_OR_DEPARTMENT_OTHER): Payer: Self-pay

## 2022-10-07 MED ORDER — SERTRALINE HCL 100 MG PO TABS: 100.0000 mg | ORAL_TABLET | Freq: Every day | ORAL | 1 refills | Status: DC

## 2022-10-07 MED ORDER — BUPROPION HCL ER (XL) 300 MG PO TB24
300.0000 mg | ORAL_TABLET | Freq: Every day | ORAL | 1 refills | Status: DC
  Filled 2022-12-11: qty 90, 90d supply, fill #0

## 2022-10-07 MED ORDER — ALPRAZOLAM 0.5 MG PO TABS
0.5000 mg | ORAL_TABLET | Freq: Two times a day (BID) | ORAL | 2 refills | Status: AC | PRN
  Filled 2022-10-07: qty 60, 30d supply, fill #0
  Filled 2022-12-11: qty 60, 30d supply, fill #1

## 2022-10-09 ENCOUNTER — Other Ambulatory Visit (HOSPITAL_COMMUNITY): Payer: Self-pay

## 2022-10-13 NOTE — Progress Notes (Signed)
[facility] PREP Weekly Session  Patient Details  Name: SHAKEISHA SHADDOCK MRN: [id number] Date of Birth: [birth year] Age: 72 y.o. PCP: Shelva Majestic, MD  Vitals:   10/13/22 1417  Weight: 144 lb 3.2 oz (65.4 kg)     [facility] Weekly seesion - 10/13/22 1400       [facility] "PREP" Location   [facility] "PREP" Location [name]      Weekly Session   Topic Discussed Calorie breakdown   High quality Carbs, proteins and fats, the macronutrients; diff between simple and complex carbs, choosing nutrient dense foods   Minutes exercised this week 230 minutes    Classes attended to date 13             Thunder [place] B Khai Torbert 10/13/2022, 2:18 PM

## 2022-10-17 ENCOUNTER — Encounter: Payer: Self-pay | Admitting: Family Medicine

## 2022-10-20 NOTE — Progress Notes (Signed)
[facility] PREP Weekly Session  Patient Details  Name: Suzanne [name] MRN: [id number] Date of Birth: November 01, 1950 Age: 72 y.o. PCP: Shelva Majestic, MD  Vitals:   10/20/22 1409  Weight: 144 lb 4.8 oz (65.5 kg)     [facility] Weekly seesion - 10/20/22 1400       [facility] "PREP" Location   [facility] "PREP" Location [name]      Weekly Session   Topic Discussed Hitting roadblocks;Finding support   Membership and wellness talk with [name] and [name]; review of last 2 weeks of program.   Minutes exercised this week 200 minutes    Classes attended to date 48             Broderic Bara B Aleesha Ringstad 10/20/2022, 2:10 PM

## 2022-10-21 ENCOUNTER — Ambulatory Visit: Payer: Medicare PPO | Admitting: Cardiology

## 2022-10-24 ENCOUNTER — Other Ambulatory Visit (HOSPITAL_COMMUNITY): Payer: Self-pay

## 2022-10-27 NOTE — Progress Notes (Signed)
[facility] PREP Weekly Session  Patient Details  Name: Suzanne [name] MRN: [id number] Date of Birth: February 25, 1950 Age: 72 y.o. PCP: Shelva Majestic, MD  Vitals:   10/27/22 1410  Weight: 145 lb 12.8 oz (66.1 kg)     [facility] Weekly seesion - 10/27/22 1400       [facility] "PREP" Location   [facility] "PREP" Location [name]      Weekly Session   Topic Discussed Other   Fit testing completed, how fit and strong survey completed, final assessment scheduled for Wednesday 9/25   Minutes exercised this week 175 minutes    Classes attended to date 71             Teshara Moree B Masiya Claassen 10/27/2022, 2:11 PM

## 2022-10-28 ENCOUNTER — Ambulatory Visit
Admission: RE | Admit: 2022-10-28 | Discharge: 2022-10-28 | Disposition: A | Discharge status: Home / Self Care | Payer: Medicare PPO | Source: Ambulatory Visit | Attending: Family Medicine | Admitting: Family Medicine

## 2022-10-28 DIAGNOSIS — Z1231 Encounter for screening mammogram for malignant neoplasm of breast: Secondary | ICD-10-CM | POA: Diagnosis not present

## 2022-10-29 ENCOUNTER — Encounter: Payer: Self-pay | Admitting: Family Medicine

## 2022-10-29 NOTE — Progress Notes (Signed)
[facility] PREP Evaluation  Patient Details  Name: Suzanne [name] MRN: [id number] Date of Birth: 06-01-1950 Age: 72 y.o. PCP: Shelva Majestic, MD  Vitals:   10/29/22 1443  BP: 94/60  Pulse: 73  SpO2: 96%  Weight: 145 lb 12.8 oz (66.1 kg)     [facility] Eval - 10/29/22 1400       Referral    Program Start Date 08/04/22    Program End Date 10/29/22      Measurement   Waist Circumference 33 inches    Waist Circumference End Program 33 inches    Hip Circumference 39 inches    Hip Circumference End Program 39 inches    Body fat 40.3 percent      Mobility and Daily Activities   I find it easy to walk up or down two or more flights of stairs. 3    I have no trouble taking out the trash. 4    I do housework such as vacuuming and dusting on my own without difficulty. 3    I can easily lift a gallon of milk (8lbs). 4    I can easily walk a mile. 3    I have no trouble reaching into high cupboards or reaching down to pick up something from the floor. 4    I do not have trouble doing out-door work such as Loss adjuster, chartered, raking leaves, or gardening. 1      Mobility and Daily Activities   I feel younger than my age. 3    I feel independent. 4    I feel energetic. 4    I live an active life.  4    I feel strong. 3    I feel healthy. 3    I feel active as other people my age. 3      How fit and strong are you.   Fit and Strong Total Score 46            Past Medical History:  Diagnosis Date   AICD (automatic cardioverter/defibrillator) present    loop recorder   Allergy since young adulthood   seasonal pollen   Anxiety    Anxiety state 12/14/2018   Asthma    Atrial fibrillation (HCC)    s/p ablations   Atrial flutter (HCC)    Cataract surgeries 09/2017  11/2017   COPD (chronic obstructive pulmonary disease) (HCC)    COVID    2022 and 2023   Degenerative arthritis    "maybe in my left knee" (12/16/2016)   Depression    Dysrhythmia 2010   A-fib   Frozen shoulder     hx bilaterally; "had to go thru PT; no problem w/them since I learned exercises" (12/16/2016)   GERD (gastroesophageal reflux disease)    Glaucoma    H/O: rheumatic fever 1957   Heart murmur h/o rheumatic fever   High cholesterol    Migraine    "getting botox now; now only 2-3/month" (12/16/2016)   PONV (postoperative nausea and vomiting)    with Fentanyl,  Dilaudid &  Propofol (12/16/2016)   Past Surgical History:  Procedure Laterality Date   ABDOMINAL HYSTERECTOMY  1990   right ovary removed   APPENDECTOMY  1990   ATRIAL FIBRILLATION ABLATION N/A 12/16/2016   Procedure: ATRIAL FIBRILLATION ABLATION;  Surgeon: Hillis Range, MD;  [facility];  Service: Cardiovascular;  Laterality: N/A;   CATARACT EXTRACTION, BILATERAL     COSMETIC SURGERY  1967  rhinoplasty   CTI ablation  04/2009   ESOPHAGOGASTRODUODENOSCOPY (EGD) WITH PROPOFOL N/A 01/06/2013   Procedure: ESOPHAGOGASTRODUODENOSCOPY (EGD) WITH PROPOFOL;  Surgeon: Florencia Reasons, MD;  Location: [facility];  Service: Endoscopy;  Laterality: N/A;   EYE SURGERY  catarracts 2019   implantable loop recorder removal  11/05/2020   MDT LINQ removed by Dr Johney Frame   JOINT REPLACEMENT  November 2020   L knee   KNEE ARTHROSCOPY Bilateral 1999-2006   KNEE SURGERY Bilateral 4034,7425   "open; for congenital deformity"   LOOP RECORDER INSERTION N/A 10/02/2016   Procedure: LOOP RECORDER INSERTION;  Surgeon: Hillis Range, MD;  [facility];  Service: Cardiovascular;  Laterality: N/A;   PERIPHERAL VASCULAR INTERVENTION  2/11  & 6/11   RHINOPLASTY  1965   TONSILLECTOMY  as child   TOOTH EXTRACTION  2009   2 teeth extractted   TOTAL KNEE ARTHROPLASTY Left 12/27/2018   Procedure: TOTAL KNEE ARTHROPLASTY;  Surgeon: Salvatore Marvel, MD;  Location: [facility];  Service: Orthopedics;  Laterality: Left;   Social History   Tobacco Use  Smoking Status Every Day   Current packs/day: 0.75   Average packs/day: 0.8  packs/day for 50.0 years (37.5 ttl pk-yrs)   Types: Cigarettes  Smokeless Tobacco Never  Tobacco Comments   always has been a struggle  How fit and strong survey: 08/04/22: 38 10/29/22: 46 Education sessions attended: 10 Strength training sessions completed: 10 Enrolled in [employer] full nutrition program for next 12 weeks.  [name] 10/29/2022, 2:47 PM

## 2022-11-12 ENCOUNTER — Other Ambulatory Visit (HOSPITAL_COMMUNITY): Payer: Self-pay

## 2022-11-25 NOTE — Progress Notes (Unsigned)
Cardiology Office Note Date:  11/25/2022  Patient ID:  Suzanne [name], Suzanne [name] 02-Aug-1950, MRN [id number] PCP:  Shelva Majestic, MD  Electrophysiologist: Dr. Johney Frame (inactive)    Chief Complaint:  annual visit  History of Present Illness: Suzanne [name] is a 72 y.o. female with history of COPD, HLD, GERD, AFib/Flutter, migraine HAs, OSA   I saw her 12/02/21 Since her last visit she traveled to [place], days typically involved walking an average of a day, she felt great until she got COVID, had a headache/fatigue with it, but did well with it, She will get a random sharp pain to the left posterior neck/occipital area, feels better with massage, is brief. She has an infrequent unrelated far left lateral chest pain, also sharp, random, not positional or exertional, no associated symptoms, also brief. No SOB No DOE No dizzy spells, near syncope or syncope. No bleeding She was planned to transition and see Dr. Elberta Fortis at her next visit  TODAY She is doing very well Traveled in the spring to [place], while there got a terrible PNA Summer another bout of COVID Recovered well from both of, participated in an organized exercise program to get her strength back and did well Back to exercising in general again No CP She does not think she has had any AF since her ablation No bleeding or signs of bleeding She feels best staying on the [facility] No near syncope or syncope No unusual SOB with her baseline COPD    AFib/flutter Hx: Diagnosed 2010 Flecainide/amiodarone failed to maintain sinus, seems used very remotely PVI ablation 2011 PVI/CTI ablation 12/16/2016  Past Medical History:  Diagnosis Date   AICD (automatic cardioverter/defibrillator) present    loop recorder   Allergy since young adulthood   seasonal pollen   Anxiety    Anxiety state 12/14/2018   Asthma    Atrial fibrillation (HCC)    s/p ablations   Atrial flutter (HCC)    Cataract surgeries 09/2017   11/2017   COPD (chronic obstructive pulmonary disease) (HCC)    COVID    2022 and 2023   Degenerative arthritis    "maybe in my left knee" (12/16/2016)   Depression    Dysrhythmia 2010   A-fib   Frozen shoulder    hx bilaterally; "had to go thru PT; no problem w/them since I learned exercises" (12/16/2016)   GERD (gastroesophageal reflux disease)    Glaucoma    H/O: rheumatic fever 1957   Heart murmur h/o rheumatic fever   High cholesterol    Migraine    "getting botox now; now only 2-3/month" (12/16/2016)   PONV (postoperative nausea and vomiting)    with Fentanyl,  Dilaudid &  Propofol (12/16/2016)    Past Surgical History:  Procedure Laterality Date   ABDOMINAL HYSTERECTOMY  1990   right ovary removed   APPENDECTOMY  1990   ATRIAL FIBRILLATION ABLATION N/A 12/16/2016   Procedure: ATRIAL FIBRILLATION ABLATION;  Surgeon: Hillis Range, MD;  [facility];  Service: Cardiovascular;  Laterality: N/A;   CATARACT EXTRACTION, BILATERAL     COSMETIC SURGERY  1967   rhinoplasty   CTI ablation  04/2009   ESOPHAGOGASTRODUODENOSCOPY (EGD) WITH PROPOFOL N/A 01/06/2013   Procedure: ESOPHAGOGASTRODUODENOSCOPY (EGD) WITH PROPOFOL;  Surgeon: Florencia Reasons, MD;  [facility];  Service: Endoscopy;  Laterality: N/A;   EYE SURGERY  catarracts 2019   implantable loop recorder removal  11/05/2020   MDT LINQ removed by Dr Johney Frame  JOINT REPLACEMENT  November 2020   L knee   KNEE ARTHROSCOPY Bilateral 1999-2006   KNEE SURGERY Bilateral [id number]   "open; for congenital deformity"   LOOP RECORDER INSERTION N/A 10/02/2016   Procedure: LOOP RECORDER INSERTION;  Surgeon: Hillis Range, MD;  [facility];  Service: Cardiovascular;  Laterality: N/A;   PERIPHERAL VASCULAR INTERVENTION  2/11  & 6/11   RHINOPLASTY  1965   TONSILLECTOMY  as child   TOOTH EXTRACTION  2009   2 teeth extractted   TOTAL KNEE ARTHROPLASTY Left 12/27/2018   Procedure: TOTAL  KNEE ARTHROPLASTY;  Surgeon: Salvatore Marvel, MD;  Location: [facility];  Service: Orthopedics;  Laterality: Left;    Current Outpatient Medications  Medication Sig Dispense Refill   acetaminophen (TYLENOL) 325 MG tablet Take 325 mg 2 (two) times daily as needed by mouth (pain).      albuterol (VENTOLIN HFA) 108 (90 Base) MCG/ACT inhaler INHALE 2 PUFFS INTO THE LUNGS EVERY 6 HOURS AS NEEDED FOR WHEEZING OR SHORTNESS OF BREATH. 6.7 g 2   ALPRAZolam (XANAX) 0.5 MG tablet Take 1 tablet (0.5 mg total) by mouth 2 (two) times daily as needed for anxiety 60 tablet 2   apixaban (ELIQUIS) 5 MG TABS tablet Take 1 tablet (5 mg total) by mouth 2 (two) times daily. 180 tablet 1   buPROPion (WELLBUTRIN XL) 300 MG 24 hr tablet Take 1 tablet (300 mg total) by mouth daily. 90 tablet 1   butalbital-acetaminophen-caffeine (FIORICET) 50-325-40 MG tablet Take 1 tablet by mouth every 6 hours as needed for headache--must last 30 days 30 tablet 0   dorzolamide-timolol (COSOPT) 2-0.5 % ophthalmic solution PLACE 1 DROP IN BOTH EYES TWO TIMES DAILY (Patient taking differently: Place 1 drop into both eyes 2 (two) times daily.) 20 mL 4   Galcanezumab-gnlm (EMGALITY) 120 MG/ML SOAJ Inject 120 mg as directed every 30 days. 3 mL 4   Probiotic Product (ALIGN) 4 MG CAPS Take 4 mg by mouth See admin instructions. Take 4 mg by mouth once a day when taking antibiotics     promethazine (PHENERGAN) 25 MG tablet Take 1 tablet (25 mg total) by mouth every 6 (six) hours as needed for nausea or vomiting. 20 tablet 1   REFRESH TEARS PF 0.5-0.9 % SOLN Place 1 drop into both eyes 3 (three) times daily as needed (for dryness).     rosuvastatin (CRESTOR) 5 MG tablet Take 1 tablet (5 mg total) by mouth once a week. 13 tablet 3   senna (SENOKOT) 8.6 MG tablet Take 1 tablet by mouth daily as needed for constipation.     sertraline (ZOLOFT) 100 MG tablet Take 1 tablet (100 mg) by mouth daily. 90 tablet 1   sertraline (ZOLOFT) 100 MG tablet Take 1 tablet  (100 mg total) by mouth daily. 90 tablet 1   Simethicone (GAS-X PO) Take 2 tablets daily as needed by mouth (gas).     sodium chloride (OCEAN) 0.65 % SOLN nasal spray Place 1 spray as needed into both nostrils for congestion.     SUMAtriptan (IMITREX) 100 MG tablet TAKE 1 TABLET BY MOUTH AT THE ONSET OF A HEADACHE DAILY AS NEEDED (should last 90 days) 27 tablet 0   Vitamin D, Ergocalciferol, (DRISDOL) 1.25 MG (50000 UNIT) CAPS capsule Take 1 capsule (50,000 Units total) by mouth every 7 (seven) days. 13 capsule 1   No current facility-administered medications for this visit.    Allergies:   Dilaudid [hydromorphone hcl], Fentanyl, Ciprofloxacin,  Erythromycin, and Percocet [oxycodone-acetaminophen]   Social History:  The patient  reports that she has been smoking cigarettes. She has a 37.5 pack-year smoking history. She has never used smokeless tobacco. She reports current alcohol use. She reports that she does not use drugs.   Family History:  The patient's family history includes Anxiety disorder in her mother; Cancer in her mother; Dementia in her father; Heart disease in her father; Hyperlipidemia in her brother, brother, and father; Migraines in her mother; Other in her brother.  ROS:  Please see the history of present illness.    All other systems are reviewed and otherwise negative.   PHYSICAL EXAM:  VS:  There were no vitals taken for this visit. BMI: There is no height or weight on file to calculate BMI. Well nourished, well developed, in no acute distress HEENT: normocephalic, atraumatic Neck: no JVD, carotid bruits or masses Cardiac: RRR; no significant murmurs, no rubs, or gallops Lungs:  CTA b/l, no wheezing, rhonchi or rales Abd: soft, nontender MS: no deformity or atrophy Ext:  no edema Skin: warm and dry, no rash Neuro:  No gross deficits appreciated Psych: euthymic mood, full affect   EKG:  not done today  Aug 2023: monitor Predominant rhythm was sinus rhythm Less  than 1% ventricular and supraventricular ectopy No atrial fibrillation No triggered episodes  09/12/21: stress myoview The patient reported dyspnea and nausea during the stress test. Normal blood pressure and normal heart rate response noted during stress.   Baseline EKG demonstrates NSR with Nonspecific ST abnormality   Slight accentuation of baseline of nonspecific ST abnormality The ECG was not diagnostic due to pharmacologic protocol.   LV perfusion is normal. There is no evidence of ischemia. There is no evidence of infarction.   Left ventricular function is normal. Nuclear stress EF: 63 %. The left ventricular ejection fraction is normal (55-65%). End diastolic cavity size is normal. End systolic cavity size is normal.   Prior study not available for comparison.   The study is normal. The study is low risk.  09/12/21: TTE  1. Left ventricular ejection fraction, by estimation, is 55 to 60%. The  left ventricle has normal function. The left ventricle has no regional  wall motion abnormalities. Left ventricular diastolic parameters are  consistent with Grade I diastolic  dysfunction (impaired relaxation). The average left ventricular global  longitudinal strain is -24.2 %. The global longitudinal strain is normal.   2. Right ventricular systolic function is normal. The right ventricular  size is normal. There is normal pulmonary artery systolic pressure. The  estimated right ventricular systolic pressure is 26.2 mmHg.   3. The mitral valve is abnormal. Trivial mitral valve regurgitation.   4. The aortic valve is tricuspid. Aortic valve regurgitation is not  visualized.   5. The inferior vena cava is normal in size with greater than 50%  respiratory variability, suggesting right atrial pressure of 3 mmHg.   Comparison(s): Prior images unable to be directly viewed, comparison made  by report only. No significant change from prior study. 12/05/2013: LVEF  55-60%, grade 1 DD.    12/16/2016: EPS/Ablation CONCLUSIONS: 1. Sinus rhythm upon presentation.   2. Intracardiac echo reveals a moderately enlared sized left atrium with four separate pulmonary veins without evidence of pulmonary vein stenosis. 3. Return of electrical activity within the left superior pulmonary vein.  The left inferior, right superior, and right inferior pulmonary veins were quiescent from a prior ablation procedure 4. As the prior ablation  was an antral ablation procedure, I elected to perform [facility] around all four pulmonary veins today.  Entrance and exit block was confirmed in all 4 PVs with adenosine and isuprel administered    5. Additional Cavo-tricuspid isthmus ablation was performed with complete bidirectional isthmus block achieved.  6. No inducible arrhythmias following ablation both on and off of Isuprel 7. No early apparent complications.   12/05/2013: TTE Study Conclusions  - Left ventricle: The cavity size was normal. Wall thickness was    normal. Systolic function was normal. The estimated ejection    fraction was in the range of 55% to 60%. Wall motion was normal;    there were no regional wall motion abnormalities. Doppler    parameters are consistent with abnormal left ventricular    relaxation (grade 1 diastolic dysfunction).     Recent Labs: 06/26/2022: Magnesium 2.3 08/05/2022: ALT 12; BUN 14; Creatinine, Ser 0.76; Hemoglobin 13.1; Platelets 159.0; Potassium 4.9; Sodium 140  08/05/2022: Cholesterol 224; HDL 63.50; LDL Cholesterol 134; Total CHOL/HDL Ratio 4; Triglycerides 132.0; VLDL 26.4   CrCl cannot be calculated (Patient's most recent lab result is older than the maximum 21 days allowed.).   Wt Readings from Last 3 Encounters:  10/29/22 145 lb 12.8 oz (66.1 kg)  10/27/22 145 lb 12.8 oz (66.1 kg)  10/20/22 144 lb 4.8 oz (65.5 kg)     Other studies reviewed: Additional studies/records reviewed today include: summarized above  ASSESSMENT AND PLAN:  Paroxysmal  AFib CHA2DS3Vasc is 2 (age, gender) maintained on Eliquis, appropriately dosed No/low burden if any  2. CP/SOB not an ongoing c/o Did not appear to have a cardiac etiology Atypical Stress/echo looked good  3. HLD Her PMD is managing Gets HA from crestor (taking it 2d/week), discussed trying alternatives/referral to our lipid clinic, she will continue tow work with her PMD   Plan to have her see Dr. Elberta Fortis next   Disposition: back in a year again, sooner if needed   Current medicines are reviewed at length with the patient today.  The patient did not have any concerns regarding medicines.  Norma Fredrickson, PA-C 11/25/2022 7:27 AM     [facility] [address] [place] [address] [phone] (office)  [phone] (fax)

## 2022-11-27 ENCOUNTER — Other Ambulatory Visit (HOSPITAL_COMMUNITY): Payer: Self-pay

## 2022-11-27 ENCOUNTER — Encounter: Payer: Self-pay | Admitting: Physician Assistant

## 2022-11-27 ENCOUNTER — Ambulatory Visit: Payer: Medicare PPO | Attending: Cardiology | Admitting: Physician Assistant

## 2022-11-27 VITALS — BP 116/74 | HR 66 | Ht 67.0 in | Wt 142.0 lb

## 2022-11-27 DIAGNOSIS — I480 Paroxysmal atrial fibrillation: Secondary | ICD-10-CM

## 2022-11-27 DIAGNOSIS — E785 Hyperlipidemia, unspecified: Secondary | ICD-10-CM

## 2022-11-27 MED ORDER — APIXABAN 5 MG PO TABS
5.0000 mg | ORAL_TABLET | Freq: Two times a day (BID) | ORAL | 3 refills | Status: DC
Start: 2022-11-27 — End: 2023-12-09
  Filled 2022-11-27 – 2022-12-29 (×2): qty 180, 90d supply, fill #0
  Filled 2023-03-26: qty 180, 90d supply, fill #1
  Filled 2023-06-24: qty 180, 90d supply, fill #2
  Filled 2023-09-24: qty 180, 90d supply, fill #3

## 2022-11-27 NOTE — Patient Instructions (Signed)
Medication Instructions:  Your physician recommends that you continue on your current medications as directed. Please refer to the Current Medication list given to you today.  *If you need a refill on your cardiac medications before your next appointment, please call your pharmacy*   Lab Work: None ordered  If you have labs (blood work) drawn today and your tests are completely normal, you will receive your results only by: MyChart Message (if you have MyChart) OR A paper copy in the mail If you have any lab test that is abnormal or we need to change your treatment, we will call you to review the results.   Testing/Procedures: None ordered   Follow-Up: At [facility], you and your health needs are our priority.  As part of our continuing mission to provide you with exceptional heart care, we have created designated Provider Care Teams.  These Care Teams include your primary Cardiologist (physician) and Advanced Practice Providers (APPs -  Physician Assistants and Nurse Practitioners) who all work together to provide you with the care you need, when you need it.  We recommend signing up for the patient portal called "MyChart".  Sign up information is provided on this After Visit Summary.  MyChart is used to connect with patients for Virtual Visits (Telemedicine).  Patients are able to view lab/test results, encounter notes, upcoming appointments, etc.  Non-urgent messages can be sent to your provider as well.   To learn more about what you can do with MyChart, go to [email or web address].    Your next appointment:   1 year(s)  Provider:   Loman Brooklyn, MD    Other Instructions

## 2022-12-05 ENCOUNTER — Other Ambulatory Visit (HOSPITAL_COMMUNITY): Payer: Self-pay

## 2022-12-11 ENCOUNTER — Other Ambulatory Visit (HOSPITAL_COMMUNITY): Payer: Self-pay

## 2022-12-12 ENCOUNTER — Other Ambulatory Visit: Payer: Self-pay

## 2022-12-12 ENCOUNTER — Other Ambulatory Visit (HOSPITAL_COMMUNITY): Payer: Self-pay

## 2022-12-15 ENCOUNTER — Encounter: Payer: Self-pay | Admitting: Family Medicine

## 2022-12-29 ENCOUNTER — Other Ambulatory Visit: Payer: Self-pay | Admitting: Family Medicine

## 2022-12-29 ENCOUNTER — Other Ambulatory Visit (HOSPITAL_COMMUNITY): Payer: Self-pay

## 2022-12-29 MED ORDER — DORZOLAMIDE HCL-TIMOLOL MAL 2-0.5 % OP SOLN
OPHTHALMIC | 4 refills | Status: AC
  Filled 2022-12-29: qty 20, 90d supply, fill #0
  Filled 2023-04-14: qty 20, 90d supply, fill #1
  Filled 2023-08-09: qty 20, 90d supply, fill #2
  Filled 2023-11-24: qty 20, 90d supply, fill #3

## 2022-12-29 MED ORDER — ALBUTEROL SULFATE HFA 108 (90 BASE) MCG/ACT IN AERS
2.0000 | INHALATION_SPRAY | Freq: Four times a day (QID) | RESPIRATORY_TRACT | 2 refills | Status: DC | PRN
  Filled 2022-12-29: qty 6.7, 25d supply, fill #0
  Filled 2023-03-26: qty 6.7, 25d supply, fill #1

## 2022-12-30 ENCOUNTER — Other Ambulatory Visit (HOSPITAL_COMMUNITY): Payer: Self-pay

## 2022-12-30 ENCOUNTER — Other Ambulatory Visit: Payer: Self-pay

## 2022-12-30 DIAGNOSIS — F411 Generalized anxiety disorder: Secondary | ICD-10-CM | POA: Diagnosis not present

## 2022-12-30 DIAGNOSIS — F331 Major depressive disorder, recurrent, moderate: Secondary | ICD-10-CM | POA: Diagnosis not present

## 2022-12-30 MED ORDER — ALPRAZOLAM 0.5 MG PO TABS
0.5000 mg | ORAL_TABLET | Freq: Two times a day (BID) | ORAL | 2 refills | Status: DC | PRN
  Filled 2023-02-28: qty 60, 30d supply, fill #0

## 2022-12-30 MED ORDER — BUPROPION HCL ER (XL) 300 MG PO TB24
300.0000 mg | ORAL_TABLET | Freq: Every day | ORAL | 0 refills | Status: DC
  Filled 2023-03-12: qty 90, 90d supply, fill #0

## 2022-12-30 MED ORDER — SERTRALINE HCL 100 MG PO TABS
100.0000 mg | ORAL_TABLET | Freq: Every day | ORAL | 0 refills | Status: DC
  Filled 2023-03-12: qty 90, 90d supply, fill #0

## 2022-12-31 ENCOUNTER — Other Ambulatory Visit (HOSPITAL_COMMUNITY): Payer: Self-pay

## 2023-01-12 ENCOUNTER — Other Ambulatory Visit: Payer: Self-pay | Admitting: Neurology

## 2023-01-12 ENCOUNTER — Other Ambulatory Visit: Payer: Self-pay | Admitting: Family Medicine

## 2023-01-12 MED ORDER — BUTALBITAL-APAP-CAFFEINE 50-325-40 MG PO TABS
1.0000 | ORAL_TABLET | Freq: Four times a day (QID) | ORAL | 0 refills | Status: DC | PRN
  Filled 2023-01-12: qty 30, 30d supply, fill #0

## 2023-01-12 NOTE — Telephone Encounter (Signed)
Requested Prescriptions   Pending Prescriptions Disp Refills   butalbital-acetaminophen-caffeine (FIORICET) 50-325-40 MG tablet 30 tablet 0    Sig: Take 1 tablet by mouth every 6 hours as needed for headache--must last 30 days   Last seen 07/03/22, next appt scheduled 07/09/23  Dispenses   Dispensed Days Supply Quantity Provider Pharmacy  butalbital-acetaminophen-caffeine (FIORICET) 50-325-40 MG tablet 09/17/2022 30 30 tablet Glean Salvo, NP [place] [facility]  butalbital-acetaminophen-caffeine (FIORICET) 50-325-40 MG tablet 05/20/2022 8

## 2023-01-13 ENCOUNTER — Other Ambulatory Visit (HOSPITAL_COMMUNITY): Payer: Self-pay

## 2023-01-13 ENCOUNTER — Other Ambulatory Visit: Payer: Self-pay

## 2023-01-13 MED ORDER — VITAMIN D (ERGOCALCIFEROL) 1.25 MG (50000 UNIT) PO CAPS
50000.0000 [IU] | ORAL_CAPSULE | ORAL | 1 refills | Status: DC
  Filled 2023-01-13: qty 12, 84d supply, fill #0
  Filled 2023-03-12 – 2023-03-26 (×2): qty 12, 84d supply, fill #1
  Filled 2023-08-09: qty 12, 84d supply, fill #2

## 2023-02-06 ENCOUNTER — Other Ambulatory Visit (HOSPITAL_COMMUNITY): Payer: Self-pay

## 2023-02-24 ENCOUNTER — Other Ambulatory Visit (HOSPITAL_COMMUNITY): Payer: Self-pay

## 2023-02-28 ENCOUNTER — Other Ambulatory Visit (HOSPITAL_COMMUNITY): Payer: Self-pay

## 2023-02-28 ENCOUNTER — Other Ambulatory Visit (HOSPITAL_BASED_OUTPATIENT_CLINIC_OR_DEPARTMENT_OTHER): Payer: Self-pay

## 2023-03-02 ENCOUNTER — Ambulatory Visit
Admission: RE | Admit: 2023-03-02 | Discharge: 2023-03-02 | Disposition: A | Discharge status: Home / Self Care | Payer: Medicare PPO | Source: Ambulatory Visit | Attending: Acute Care | Admitting: Acute Care

## 2023-03-02 ENCOUNTER — Other Ambulatory Visit (HOSPITAL_COMMUNITY): Payer: Self-pay

## 2023-03-02 DIAGNOSIS — Z87891 Personal history of nicotine dependence: Secondary | ICD-10-CM | POA: Diagnosis not present

## 2023-03-02 DIAGNOSIS — F17210 Nicotine dependence, cigarettes, uncomplicated: Secondary | ICD-10-CM

## 2023-03-12 ENCOUNTER — Other Ambulatory Visit (HOSPITAL_COMMUNITY): Payer: Self-pay

## 2023-03-12 ENCOUNTER — Other Ambulatory Visit: Payer: Self-pay

## 2023-03-13 ENCOUNTER — Other Ambulatory Visit: Payer: Self-pay

## 2023-03-13 DIAGNOSIS — Z122 Encounter for screening for malignant neoplasm of respiratory organs: Secondary | ICD-10-CM

## 2023-03-13 DIAGNOSIS — Z87891 Personal history of nicotine dependence: Secondary | ICD-10-CM

## 2023-03-13 DIAGNOSIS — F17210 Nicotine dependence, cigarettes, uncomplicated: Secondary | ICD-10-CM

## 2023-03-26 ENCOUNTER — Other Ambulatory Visit (HOSPITAL_COMMUNITY): Payer: Self-pay

## 2023-04-01 DIAGNOSIS — F331 Major depressive disorder, recurrent, moderate: Secondary | ICD-10-CM | POA: Diagnosis not present

## 2023-04-01 DIAGNOSIS — F411 Generalized anxiety disorder: Secondary | ICD-10-CM | POA: Diagnosis not present

## 2023-04-02 DIAGNOSIS — H524 Presbyopia: Secondary | ICD-10-CM | POA: Diagnosis not present

## 2023-04-02 DIAGNOSIS — H401232 Low-tension glaucoma, bilateral, moderate stage: Secondary | ICD-10-CM | POA: Diagnosis not present

## 2023-04-02 DIAGNOSIS — Z961 Presence of intraocular lens: Secondary | ICD-10-CM | POA: Diagnosis not present

## 2023-04-02 DIAGNOSIS — H04123 Dry eye syndrome of bilateral lacrimal glands: Secondary | ICD-10-CM | POA: Diagnosis not present

## 2023-04-03 ENCOUNTER — Other Ambulatory Visit (HOSPITAL_BASED_OUTPATIENT_CLINIC_OR_DEPARTMENT_OTHER): Payer: Self-pay

## 2023-04-03 ENCOUNTER — Other Ambulatory Visit (HOSPITAL_COMMUNITY): Payer: Self-pay

## 2023-04-03 MED ORDER — ALPRAZOLAM 0.5 MG PO TABS
0.5000 mg | ORAL_TABLET | Freq: Two times a day (BID) | ORAL | 2 refills | Status: DC | PRN
  Filled 2023-04-03: qty 60, 30d supply, fill #0
  Filled 2023-06-08: qty 60, 30d supply, fill #1
  Filled 2023-08-09: qty 60, 30d supply, fill #2

## 2023-04-03 MED ORDER — BUPROPION HCL ER (XL) 300 MG PO TB24
300.0000 mg | ORAL_TABLET | Freq: Every day | ORAL | 0 refills | Status: DC
  Filled 2023-04-03 – 2023-06-08 (×2): qty 90, 90d supply, fill #0

## 2023-04-03 MED ORDER — SERTRALINE HCL 100 MG PO TABS
100.0000 mg | ORAL_TABLET | Freq: Every day | ORAL | 0 refills | Status: DC
  Filled 2023-04-03 – 2023-06-08 (×2): qty 90, 90d supply, fill #0

## 2023-04-14 ENCOUNTER — Other Ambulatory Visit: Payer: Self-pay | Admitting: Neurology

## 2023-04-14 ENCOUNTER — Other Ambulatory Visit: Payer: Self-pay

## 2023-04-14 ENCOUNTER — Other Ambulatory Visit (HOSPITAL_COMMUNITY): Payer: Self-pay

## 2023-04-14 MED ORDER — BUTALBITAL-APAP-CAFFEINE 50-325-40 MG PO TABS
1.0000 | ORAL_TABLET | Freq: Four times a day (QID) | ORAL | 0 refills | Status: DC | PRN
  Filled 2023-04-14: qty 30, 8d supply, fill #0

## 2023-04-14 NOTE — Telephone Encounter (Signed)
 Last seen 07/03/22, next appt 07/09/23  Dispenses   Dispensed Days Supply Quantity Provider Pharmacy  butalbital-acetaminophen-caffeine (FIORICET) [phone] MG tablet 01/13/2023 30 30 tablet Glean Salvo, NP [place] [facility]  butalbital-acetaminophen-caffeine (FIORICET) 50-325-40 MG tablet 09/17/2022 30 30 tablet Glean Salvo, NP [place] [facility]  butalbital-acetaminophen-caffeine (FIORICET) 50-325-40 MG tablet 05/20/2022 8 30 tablet Glean Salvo, NP [place] [facility]Marland KitchenMarland Kitchen

## 2023-04-15 ENCOUNTER — Other Ambulatory Visit (HOSPITAL_COMMUNITY): Payer: Self-pay

## 2023-06-08 ENCOUNTER — Other Ambulatory Visit (HOSPITAL_COMMUNITY): Payer: Self-pay

## 2023-06-09 ENCOUNTER — Other Ambulatory Visit (HOSPITAL_COMMUNITY): Payer: Self-pay

## 2023-06-09 ENCOUNTER — Other Ambulatory Visit: Payer: Self-pay

## 2023-06-11 ENCOUNTER — Other Ambulatory Visit (HOSPITAL_COMMUNITY): Payer: Self-pay

## 2023-06-11 ENCOUNTER — Other Ambulatory Visit: Payer: Self-pay

## 2023-06-11 DIAGNOSIS — Z1331 Encounter for screening for depression: Secondary | ICD-10-CM | POA: Diagnosis not present

## 2023-06-11 DIAGNOSIS — Z01411 Encounter for gynecological examination (general) (routine) with abnormal findings: Secondary | ICD-10-CM | POA: Diagnosis not present

## 2023-06-11 DIAGNOSIS — B3731 Acute candidiasis of vulva and vagina: Secondary | ICD-10-CM | POA: Diagnosis not present

## 2023-06-11 MED ORDER — FLUCONAZOLE 150 MG PO TABS
ORAL_TABLET | ORAL | 0 refills | Status: DC
  Filled 2023-06-11: qty 3, 7d supply, fill #0

## 2023-06-11 MED ORDER — NYSTATIN-TRIAMCINOLONE 100000-0.1 UNIT/GM-% EX OINT
TOPICAL_OINTMENT | CUTANEOUS | 1 refills | Status: DC
Start: 2023-06-11 — End: 2023-08-11
  Filled 2023-06-11: qty 30, 10d supply, fill #0
  Filled 2023-06-24: qty 30, 10d supply, fill #1

## 2023-06-18 ENCOUNTER — Ambulatory Visit

## 2023-06-25 ENCOUNTER — Other Ambulatory Visit (HOSPITAL_COMMUNITY): Payer: Self-pay

## 2023-06-25 DIAGNOSIS — F411 Generalized anxiety disorder: Secondary | ICD-10-CM | POA: Diagnosis not present

## 2023-06-25 DIAGNOSIS — F331 Major depressive disorder, recurrent, moderate: Secondary | ICD-10-CM | POA: Diagnosis not present

## 2023-06-25 MED ORDER — BUPROPION HCL ER (XL) 300 MG PO TB24
300.0000 mg | ORAL_TABLET | Freq: Every day | ORAL | 0 refills | Status: DC
  Filled 2023-08-13 – 2023-09-13 (×2): qty 90, 90d supply, fill #0

## 2023-06-25 MED ORDER — ALPRAZOLAM 0.5 MG PO TABS
0.5000 mg | ORAL_TABLET | Freq: Two times a day (BID) | ORAL | 2 refills | Status: DC
  Filled 2023-08-14: qty 60, 30d supply, fill #0

## 2023-06-25 MED ORDER — SERTRALINE HCL 100 MG PO TABS: 100.0000 mg | ORAL_TABLET | Freq: Every day | ORAL | 0 refills | Status: DC

## 2023-07-07 DIAGNOSIS — M1712 Unilateral primary osteoarthritis, left knee: Secondary | ICD-10-CM | POA: Diagnosis not present

## 2023-07-09 ENCOUNTER — Encounter: Payer: Self-pay | Admitting: Neurology

## 2023-07-09 ENCOUNTER — Other Ambulatory Visit: Payer: Self-pay

## 2023-07-09 ENCOUNTER — Other Ambulatory Visit (HOSPITAL_COMMUNITY): Payer: Self-pay

## 2023-07-09 ENCOUNTER — Ambulatory Visit: Payer: Medicare PPO | Admitting: Neurology

## 2023-07-09 VITALS — BP 122/80 | HR 63 | Ht 67.0 in | Wt 147.0 lb

## 2023-07-09 DIAGNOSIS — G43009 Migraine without aura, not intractable, without status migrainosus: Secondary | ICD-10-CM | POA: Diagnosis not present

## 2023-07-09 MED ORDER — BUTALBITAL-APAP-CAFFEINE 50-325-40 MG PO TABS
1.0000 | ORAL_TABLET | Freq: Four times a day (QID) | ORAL | 0 refills | Status: DC | PRN
  Filled 2023-07-09: qty 30, 84d supply, fill #0

## 2023-07-09 MED ORDER — EMGALITY 120 MG/ML ~~LOC~~ SOAJ
120.0000 mg | SUBCUTANEOUS | 4 refills | Status: AC
  Filled 2023-07-09 – 2023-09-13 (×2): qty 3, 90d supply, fill #0
  Filled 2023-11-24 – 2023-11-28 (×2): qty 3, 90d supply, fill #1
  Filled 2024-03-09: qty 3, 90d supply, fill #2

## 2023-07-09 MED ORDER — SUMATRIPTAN SUCCINATE 100 MG PO TABS
ORAL_TABLET | ORAL | 3 refills | Status: AC
  Filled 2023-07-09: qty 27, 90d supply, fill #0
  Filled 2023-11-24: qty 27, 90d supply, fill #1

## 2023-07-09 MED ORDER — NURTEC 75 MG PO TBDP: 1.0000 | ORAL_TABLET | Freq: Every day | ORAL | Status: AC | PRN

## 2023-07-09 NOTE — Patient Instructions (Signed)
 Great to see you Suzanne [name]!! Continue current medications for migraine Try Nurtec Take 1 tablet at onset of headache, max is 1 tablet in 24 hours.  If you would like me to send a prescription please send me a MyChart message. Have a great year!!  See you back in 1 year!!
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
## 2023-07-10 ENCOUNTER — Encounter: Payer: Self-pay | Admitting: Family Medicine

## 2023-07-22 DIAGNOSIS — M7062 Trochanteric bursitis, left hip: Secondary | ICD-10-CM | POA: Diagnosis not present

## 2023-07-22 DIAGNOSIS — M6281 Muscle weakness (generalized): Secondary | ICD-10-CM | POA: Diagnosis not present

## 2023-07-22 DIAGNOSIS — R269 Unspecified abnormalities of gait and mobility: Secondary | ICD-10-CM | POA: Diagnosis not present

## 2023-07-27 DIAGNOSIS — M7062 Trochanteric bursitis, left hip: Secondary | ICD-10-CM | POA: Diagnosis not present

## 2023-07-27 DIAGNOSIS — M6281 Muscle weakness (generalized): Secondary | ICD-10-CM | POA: Diagnosis not present

## 2023-07-27 DIAGNOSIS — R269 Unspecified abnormalities of gait and mobility: Secondary | ICD-10-CM | POA: Diagnosis not present

## 2023-08-03 DIAGNOSIS — R269 Unspecified abnormalities of gait and mobility: Secondary | ICD-10-CM | POA: Diagnosis not present

## 2023-08-03 DIAGNOSIS — M6281 Muscle weakness (generalized): Secondary | ICD-10-CM | POA: Diagnosis not present

## 2023-08-03 DIAGNOSIS — M7062 Trochanteric bursitis, left hip: Secondary | ICD-10-CM | POA: Diagnosis not present

## 2023-08-06 ENCOUNTER — Ambulatory Visit

## 2023-08-06 VITALS — Ht 67.0 in | Wt 147.0 lb

## 2023-08-06 DIAGNOSIS — Z1231 Encounter for screening mammogram for malignant neoplasm of breast: Secondary | ICD-10-CM

## 2023-08-06 DIAGNOSIS — Z0000 Encounter for general adult medical examination without abnormal findings: Secondary | ICD-10-CM | POA: Diagnosis not present

## 2023-08-06 DIAGNOSIS — Z8739 Personal history of other diseases of the musculoskeletal system and connective tissue: Secondary | ICD-10-CM | POA: Diagnosis not present

## 2023-08-06 NOTE — Patient Instructions (Signed)
 [name] , Thank you for taking time out of your busy schedule to complete your Annual Wellness Visit with me. I enjoyed our conversation and look forward to speaking with you again next year. I, as well as your care team,  appreciate your ongoing commitment to your health goals. Please review the following plan we discussed and let me know if I can assist you in the future. Your Game plan/ To Do List    Referrals: If you haven't heard from the office you've been referred to, please reach out to them at the phone provided.  You have an order for:  [x]   3D Mammogram  [x]   Bone Density    Please call for appointment:  [facility] [address] [place], [place] [address] [phone]  Make sure to wear two-piece clothing.  No lotions, powders, or deodorants the day of the appointment. Make sure to bring picture ID and insurance card.  Bring list of medications you are currently taking including any supplements.   Follow up Visits: Next Medicare AWV with our clinical staff: 08/10/2024.   Have you seen your provider in the last 6 months (3 months if uncontrolled diabetes)? Yes Next Office Visit with your provider: 08/11/2023.  Clinician Recommendations:  Aim for 30 minutes of exercise or brisk walking, 6-8 glasses of water , and 5 servings of fruits and vegetables each day. Keep up the good work.      This is a list of the screening recommended for you and due dates:  Health Maintenance  Topic Date Due   Flu Shot  09/04/2023   Screening for Lung Cancer  03/01/2024   Medicare Annual Wellness Visit  08/05/2024   Mammogram  10/27/2024   Colon Cancer Screening  08/15/2030   DTaP/Tdap/Td vaccine (3 - Td or Tdap) 09/03/2030   Pneumococcal Vaccine for age over 36  Completed   DEXA scan (bone density measurement)  Completed   COVID-19 Vaccine  Completed   Hepatitis C Screening  Completed   Zoster (Shingles) Vaccine  Completed   Hepatitis B Vaccine  Aged Out    HPV Vaccine  Aged Out   Meningitis B Vaccine  Aged Out    Advanced directives: (Copy Requested) Please bring a copy of your health care power of attorney and living will to the office to be added to your chart at your convenience. You can mail to [facility] [address] [place], [place] [address] or email to [email or web address] Advance Care Planning is important because it:  [x]  Makes sure you receive the medical care that is consistent with your values, goals, and preferences  [x]  It provides guidance to your family and loved ones and reduces their decisional burden about whether or not they are making the right decisions based on your wishes.  Follow the link provided in your after visit summary or read over the paperwork we have mailed to you to help you started getting your Advance Directives in place. If you need assistance in completing these, please reach out to us  so that we can help you!  See attachments for Preventive Care and Fall Prevention Tips.

## 2023-08-06 NOTE — Progress Notes (Addendum)
 Subjective:   Suzanne [name] is a 73 y.o. who presents for a Medicare Wellness preventive visit.  As a reminder, Annual Wellness Visits don't include a physical exam, and some assessments may be limited, especially if this visit is performed virtually. We may recommend an in-person follow-up visit with your provider if needed.  Visit Complete: Virtual I connected with  Suzanne [name] on 08/06/23 by a audio enabled telemedicine application and verified that I am speaking with the correct person using two identifiers.  Patient Location: Home  Provider Location: Home Office  I discussed the limitations of evaluation and management by telemedicine. The patient expressed understanding and agreed to proceed.  Vital Signs: Because this visit was a virtual/telehealth visit, some criteria may be missing or patient reported. Any vitals not documented were not able to be obtained and vitals that have been documented are patient reported.  VideoDeclined- This patient declined Librarian, academic. Therefore the visit was completed with audio only.  Persons Participating in Visit: Patient.  AWV Questionnaire: Yes: Patient Medicare AWV questionnaire was completed by the patient on 08/04/2023; I have confirmed that all information answered by patient is correct and no changes since this date.  Cardiac Risk Factors include: advanced age (>91men, >8 women);Other (see comment), Risk factor comments: A-fibb, COPD     Objective:    Today's Vitals   08/06/23 1056  Weight: 147 lb (66.7 kg)  Height: 5' 7 (1.702 m)   Body mass index is 23.02 kg/m.     08/06/2023   11:19 AM 06/24/2022   12:09 AM 05/05/2022   11:13 AM 05/03/2021    1:07 PM 09/01/2020    9:21 PM 04/27/2020    1:16 PM 03/25/2019   11:49 AM  Advanced Directives  Does Patient Have a Medical Advance Directive? Yes Yes Yes Yes No Yes Yes  Type of Estate agent of [place];Living will  Living will Healthcare Power of [place];Living will Healthcare Power of [employer] Power of Attorney Living will;Healthcare Power of Attorney  Does patient want to make changes to medical advance directive?  No - Patient declined     No - Patient declined  Copy of Healthcare Power of Attorney in Chart? No - copy requested  No - copy requested No - copy requested  No - copy requested No - copy requested  Would patient like information on creating a medical advance directive?     No - Patient declined      Current Medications (verified) Outpatient Encounter Medications as of 08/06/2023  Medication Sig   acetaminophen  (TYLENOL ) 325 MG tablet Take 325 mg 2 (two) times daily as needed by mouth (pain).    albuterol  (VENTOLIN  HFA) 108 (90 Base) MCG/ACT inhaler INHALE 2 PUFFS INTO THE LUNGS EVERY 6 HOURS AS NEEDED FOR WHEEZING OR SHORTNESS OF BREATH.   ALPRAZolam  (XANAX ) 0.5 MG tablet Take 1 tablet (0.5 mg total) by mouth 2 (two) times daily as needed for anxiety   apixaban  (ELIQUIS ) 5 MG TABS tablet Take 1 tablet (5 mg total) by mouth 2 (two) times daily.   buPROPion  (WELLBUTRIN  XL) 300 MG 24 hr tablet Take 1 tablet (300 mg total) by mouth daily.   butalbital -acetaminophen -caffeine  (FIORICET ) 50-325-40 MG tablet Take 1 tablet by mouth every 6 (six) hours as needed. 3 month supply   dorzolamide -timolol  (COSOPT ) 2-0.5 % ophthalmic solution PLACE 1 DROP IN BOTH EYES TWO TIMES DAILY   Galcanezumab -gnlm (EMGALITY ) 120 MG/ML SOAJ Inject 120 mg  as directed every 30 days.   Probiotic Product (ALIGN) 4 MG CAPS Take 4 mg by mouth See admin instructions. Take 4 mg by mouth once a day when taking antibiotics (Patient taking differently: Take 4 mg by mouth See admin instructions. Take 4 mg by mouth once a day PRN when taking antibiotics)   promethazine  (PHENERGAN ) 25 MG tablet Take 1 tablet (25 mg total) by mouth every 6 (six) hours as needed for nausea or vomiting.   Rimegepant Sulfate (NURTEC) 75 MG TBDP  Take 1 tablet (75 mg total) by mouth daily as needed.   rosuvastatin  (CRESTOR ) 5 MG tablet Take 1 tablet (5 mg total) by mouth once a week.   sertraline  (ZOLOFT ) 100 MG tablet Take 1 tablet (100 mg total) by mouth daily.   Simethicone  (GAS-X PO) Take 2 tablets daily as needed by mouth (gas).   SUMAtriptan  (IMITREX ) 100 MG tablet TAKE 1 TABLET BY MOUTH AT THE ONSET OF A HEADACHE DAILY AS NEEDED (should last 90 days)   ALPRAZolam  (XANAX ) 0.5 MG tablet Take 1 tablet (0.5 mg total) by mouth 2 (two) times daily as needed for anxiety (Patient not taking: Reported on 07/09/2023)   ALPRAZolam  (XANAX ) 0.5 MG tablet Take 1 tablet (0.5 mg total) by mouth 2 (two) times daily as needed for anxiety (Patient not taking: Reported on 07/09/2023)   ALPRAZolam  (XANAX ) 0.5 MG tablet Take 1 tablet (0.5 mg total) by mouth daily as needed for anxiety (Patient not taking: Reported on 07/09/2023)   buPROPion  (WELLBUTRIN  XL) 300 MG 24 hr tablet Take 1 tablet (300 mg total) by mouth daily. (Patient not taking: Reported on 07/09/2023)   buPROPion  (WELLBUTRIN  XL) 300 MG 24 hr tablet Take 1 tablet by mouth daily (Patient not taking: Reported on 07/09/2023)   buPROPion  (WELLBUTRIN  XL) 300 MG 24 hr tablet Take 1 tablet by mouth daily (Patient not taking: Reported on 07/09/2023)   fluconazole  (DIFLUCAN ) 150 MG tablet Take one tablet by mouth on day 1,4, and 7 as needed. (Patient not taking: Reported on 07/09/2023)   nystatin -triamcinolone  ointment (MYCOLOG) Apply a small amount to the affected area 2 times daily for 10 days as needed (Patient not taking: Reported on 08/06/2023)   REFRESH TEARS PF 0.5-0.9 % SOLN Place 1 drop into both eyes 3 (three) times daily as needed (for dryness). (Patient not taking: Reported on 07/09/2023)   senna (SENOKOT) 8.6 MG tablet Take 1 tablet by mouth daily as needed for constipation. (Patient not taking: Reported on 07/09/2023)   sertraline  (ZOLOFT ) 100 MG tablet Take 1 tablet (100 mg total) by mouth daily. (Patient not  taking: Reported on 07/09/2023)   sodium chloride  (OCEAN) 0.65 % SOLN nasal spray Place 1 spray as needed into both nostrils for congestion. (Patient not taking: Reported on 07/09/2023)   timolol  (TIMOPTIC ) 0.25 % ophthalmic solution 1 drop into each eye Ophthalmic twice a day (Patient not taking: Reported on 08/06/2023)   Vitamin D , Ergocalciferol , (DRISDOL ) 1.25 MG (50000 UNIT) CAPS capsule Take 1 capsule (50,000 Units total) by mouth every 7 (seven) days. (Patient not taking: Reported on 08/06/2023)   No facility-administered encounter medications on file as of 08/06/2023.    Allergies (verified) Dilaudid  [hydromorphone  hcl], Fentanyl , Ciprofloxacin, Erythromycin, and Percocet [oxycodone -acetaminophen ]   History: Past Medical History:  Diagnosis Date   AICD (automatic cardioverter/defibrillator) present    loop recorder   Allergy since young adulthood   seasonal pollen   Anxiety    Anxiety state 12/14/2018   Asthma  Atrial fibrillation [facility])    s/p ablations   Atrial flutter (HCC)    Cataract surgeries 09/2017  11/2017   COPD (chronic obstructive pulmonary disease) (HCC)    COVID    2022 and 2023   Degenerative arthritis    maybe in my left knee (12/16/2016)   Depression    Dysrhythmia 2010   A-fib   Frozen shoulder    hx bilaterally; had to go thru PT; no problem w/them since I learned exercises (12/16/2016)   GERD (gastroesophageal reflux disease)    Glaucoma    H/O: rheumatic fever 1957   Heart murmur h/o rheumatic fever   High cholesterol    Migraine    getting botox  now; now only 2-3/month (12/16/2016)   PONV (postoperative nausea and vomiting)    with Fentanyl ,  Dilaudid  &  Propofol  (12/16/2016)   Past Surgical History:  Procedure Laterality Date   ABDOMINAL HYSTERECTOMY  1990   right ovary removed   APPENDECTOMY  1990   ATRIAL FIBRILLATION ABLATION N/A 12/16/2016   Procedure: ATRIAL FIBRILLATION ABLATION;  Surgeon: Kelsie Agent, MD;  Location: [facility] INVASIVE CV  LAB;  Service: Cardiovascular;  Laterality: N/A;   CATARACT EXTRACTION, BILATERAL     COSMETIC SURGERY  1967   rhinoplasty   CTI ablation  04/2009   ESOPHAGOGASTRODUODENOSCOPY (EGD) WITH PROPOFOL  N/A 01/06/2013   Procedure: ESOPHAGOGASTRODUODENOSCOPY (EGD) WITH PROPOFOL ;  Surgeon: Lamar LULLA Bunk, MD;  Location: [facility] ENDOSCOPY;  Service: Endoscopy;  Laterality: N/A;   EYE SURGERY  catarracts 2019   implantable loop recorder removal  11/05/2020   MDT LINQ removed by Dr Kelsie   JOINT REPLACEMENT  November 2020   L knee   KNEE ARTHROSCOPY Bilateral 1999-2006   KNEE SURGERY Bilateral 8039,8036   open; for congenital deformity   LOOP RECORDER INSERTION N/A 10/02/2016   Procedure: LOOP RECORDER INSERTION;  Surgeon: Kelsie Agent, MD;  Location: [facility] INVASIVE CV LAB;  Service: Cardiovascular;  Laterality: N/A;   PERIPHERAL VASCULAR INTERVENTION  2/11  & 6/11   RHINOPLASTY  1965   TONSILLECTOMY  as child   TOOTH EXTRACTION  2009   2 teeth extractted   TOTAL KNEE ARTHROPLASTY Left 12/27/2018   Procedure: TOTAL KNEE ARTHROPLASTY;  Surgeon: Jane Lamar, MD;  Location: [facility] ORS;  Service: Orthopedics;  Laterality: Left;   Family History  Problem Relation Age of Onset   Cancer Mother        renal cell   Migraines Mother    Anxiety disorder Mother    Dementia Father    Heart disease Father        mid 39s cabg   Hyperlipidemia Father    Other Brother        died 19- out for run- no autopsy   Hyperlipidemia Brother        doesnt tolerate statin- now 43   Hyperlipidemia Brother    Breast cancer Neg Hx    Social History   Socioeconomic History   Marital status: Married    Spouse name: Deward   Number of children: 0   Years of education: 16   Highest education level: Master's degree (e.g., MA, MS, MEng, MEd, MSW, MBA)  Occupational History   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    Employer: [employer]   Occupation: CLINICAL NURSE CARE    Employer: [employer]   Tobacco Use   Smoking status: Every Day    Current packs/day: 0.75    Average packs/day: 0.8  packs/day for 50.0 years (37.5 ttl pk-yrs)    Types: Cigarettes   Smokeless tobacco: Never   Tobacco comments:    always has been a struggle  Vaping Use   Vaping status: Never Used  Substance and Sexual Activity   Alcohol use: Yes    Comment: rare sips of wine   Drug use: No   Sexual activity: Not Currently  Other Topics Concern   Not on file  Social History Narrative   Lives at home w/ husband and  dog(s)2025   Married 1984. No children.    Retired Charity fundraiser      Enjoys reading- history, politics, classics, etc   Caffeine : 1 cup/day   Social Drivers of Corporate investment banker Strain: Low Risk  (08/04/2023)   Overall Financial Resource Strain (CARDIA)    Difficulty of Paying Living Expenses: Not hard at all  Food Insecurity: No Food Insecurity (08/04/2023)   Hunger Vital Sign    Worried About Running Out of Food in the Last Year: Never true    Ran Out of Food in the Last Year: Never true  Transportation Needs: No Transportation Needs (08/04/2023)   PRAPARE - Administrator, Civil Service (Medical): No    Lack of Transportation (Non-Medical): No  Physical Activity: Insufficiently Active (08/04/2023)   Exercise Vital Sign    Days of Exercise per Week: 2 days    Minutes of Exercise per Session: 20 min  Stress: Stress Concern Present (08/04/2023)   [employer] of Occupational Health - Occupational Stress Questionnaire    Feeling of Stress: To some extent  Social Connections: Socially Integrated (08/04/2023)   Social Connection and Isolation Panel    Frequency of Communication with Friends and Family: More than three times a week    Frequency of Social Gatherings with Friends and Family: Twice a week    Attends Religious Services: More than 4 times per year    Active Member of [employer] or Organizations: Yes    Attends Engineer, structural: More than 4 times per year     Marital Status: Married    Tobacco Counseling Ready to quit: Not Answered Counseling given: Not Answered Tobacco comments: always has been a struggle    Clinical Intake:  Pre-visit preparation completed: Yes  Pain : No/denies pain     BMI - recorded: 23.02 Nutritional Status: BMI of 19-24  Normal Nutritional Risks: None Diabetes: No  No results found for: HGBA1C   How often do you need to have someone help you when you read instructions, pamphlets, or other written materials from your doctor or pharmacy?: 1 - Never  Interpreter Needed?: No  Information entered by :: Tarina Volk, RMA   Activities of Daily Living     08/04/2023    4:37 PM  In your present state of health, do you have any difficulty performing the following activities:  Hearing? 0  Vision? 0  Difficulty concentrating or making decisions? 0  Walking or climbing stairs? 0  Dressing or bathing? 0  Doing errands, shopping? 0  Preparing Food and eating ? N  Using the Toilet? N  In the past six months, have you accidently leaked urine? N  Do you have problems with loss of bowel control? N  Managing your Medications? N  Managing your Finances? N  Housekeeping or managing your Housekeeping? N    Patient Care Team: Katrinka Garnette KIDD, MD as PCP - General (Family Medicine) Kelsie Agent, MD (Inactive) as PCP -  Cardiology (Cardiology) Jenel Carlin POUR, MD (Inactive) as Consulting Physician (Neurology) Tammy Lynwood BROCKS, MD (Inactive) as Consulting Physician (Sleep Medicine) Charmayne Molly, MD as Consulting Physician (Ophthalmology) Lynnell Nottingham, MD as Consulting Physician (Dermatology)  I have updated your Care Teams any recent Medical Services you may have received from other providers in the past year.     Assessment:   This is a routine wellness examination for [place].  Hearing/Vision screen Hearing Screening - Comments:: Denies hearing difficulties   Vision Screening - Comments:: Denies  vision issues. Cataract surgery and implants/Dr. Charmayne    Goals Addressed             This Visit's Progress    Patient Stated   On track    Stay alive and keep moving        Depression Screen     08/06/2023   11:21 AM 07/02/2022   11:40 AM 05/05/2022   11:12 AM 08/01/2021    7:54 AM 05/03/2021    1:06 PM 03/18/2021    1:51 PM 07/31/2020    8:31 AM  PHQ 2/9 Scores  PHQ - 2 Score 0 0 0 0 0 0 2  PHQ- 9 Score 0 0  0  0 5    Fall Risk     08/04/2023    4:37 PM 05/01/2022   10:26 AM 05/03/2021    1:08 PM 07/31/2020    8:19 AM 07/31/2020    8:18 AM  Fall Risk   Falls in the past year? 0 0 0 0 0  Number falls in past yr: 0 0 0    Injury with Fall? 0 0 0    Risk for fall due to :  Impaired vision   No Fall Risks  Follow up Falls evaluation completed;Falls prevention discussed Falls prevention discussed Falls prevention discussed        Data saved with a previous flowsheet row definition    MEDICARE RISK AT HOME:  Medicare Risk at Home Any stairs in or around the home?: (Patient-Rptd) Yes If so, are there any without handrails?: (Patient-Rptd) No Home free of loose throw rugs in walkways, pet beds, electrical cords, etc?: (Patient-Rptd) Yes Adequate lighting in your home to reduce risk of falls?: (Patient-Rptd) Yes Life alert?: (Patient-Rptd) No Use of a cane, walker or w/c?: (Patient-Rptd) No Grab bars in the bathroom?: (Patient-Rptd) Yes Shower chair or bench in shower?: (Patient-Rptd) No Elevated toilet seat or a handicapped toilet?: (Patient-Rptd) Yes  TIMED UP AND GO:  Was the test performed?  No  Cognitive Function: Declined/Normal: No cognitive concerns noted by patient or family. Patient alert, oriented, able to answer questions appropriately and recall recent events. No signs of memory loss or confusion.        05/05/2022   11:15 AM 05/03/2021    1:10 PM 04/27/2020    1:19 PM 03/25/2019   11:51 AM  6CIT Screen  What Year? 0 points 0 points 0 points 0 points  What  month? 0 points 0 points 0 points 0 points  What time? 0 points 0 points  0 points  Count back from 20 0 points 0 points 0 points 0 points  Months in reverse 0 points 0 points 0 points 0 points  Repeat phrase 0 points 0 points 0 points 0 points  Total Score 0 points 0 points  0 points    Immunizations Immunization History  Administered Date(s) Administered   Fluad Quad(high Dose 65+) 10/19/2020   Influenza, High Dose  Seasonal PF 11/08/2013, 10/10/2016, 09/24/2018, 10/06/2019, 11/07/2021, 10/27/2022   Influenza,inj,quad, With Preservative 11/07/2014   Influenza-Unspecified 10/12/2016, 10/29/2017, 10/06/2019, 10/19/2020   PFIZER(Purple Top)SARS-COV-2 Vaccination 02/27/2019, 03/21/2019, 11/04/2019, 06/12/2020, 10/30/2020   Pfizer(Comirnaty)Fall Seasonal Vaccine 12 years and older 10/27/2022, 07/08/2023   Pneumococcal Conjugate-13 03/27/2016   Pneumococcal Polysaccharide-23 12/20/2013   Respiratory Syncytial Virus Vaccine,Recomb Aduvanted(Arexvy) 11/07/2021   Tdap 02/02/2013, 09/02/2020   Zoster Recombinant(Shingrix) 10/10/2022, 12/15/2022    Screening Tests Health Maintenance  Topic Date Due   INFLUENZA VACCINE  09/04/2023   Lung Cancer Screening  03/01/2024   Medicare Annual Wellness (AWV)  08/05/2024   MAMMOGRAM  10/27/2024   Colonoscopy  08/15/2030   DTaP/Tdap/Td (3 - Td or Tdap) 09/03/2030   Pneumococcal Vaccine: 50+ Years  Completed   DEXA SCAN  Completed   COVID-19 Vaccine  Completed   Hepatitis C Screening  Completed   Zoster Vaccines- Shingrix  Completed   Hepatitis B Vaccines  Aged Out   HPV VACCINES  Aged Out   Meningococcal B Vaccine  Aged Out    Health Maintenance  There are no preventive care reminders to display for this patient. Health Maintenance Items Addressed: Mammogram ordered, DEXA ordered, See Nurse Notes at the end of this note  Additional Screening:  Vision Screening: Recommended annual ophthalmology exams for early detection of glaucoma and  other disorders of the eye. Would you like a referral to an eye doctor? No    Dental Screening: Recommended annual dental exams for proper oral hygiene  Community Resource Referral / Chronic Care Management: CRR required this visit?  No   CCM required this visit?  No   Plan:    I have personally reviewed and noted the following in the patient's chart:   Medical and social history Use of alcohol, tobacco or illicit drugs  Current medications and supplements including opioid prescriptions. Patient is not currently taking opioid prescriptions. Functional ability and status Nutritional status Physical activity Advanced directives List of other physicians Hospitalizations, surgeries, and ER visits in previous 12 months Vitals Screenings to include cognitive, depression, and falls Referrals and appointments  In addition, I have reviewed and discussed with patient certain preventive protocols, quality metrics, and best practice recommendations. A written personalized care plan for preventive services as well as general preventive health recommendations were provided to patient.   Dijon Cosens L Stephfon Bovey, CMA   08/06/2023   After Visit Summary: (MyChart) Due to this being a telephonic visit, the after visit summary with patients personalized plan was offered to patient via MyChart   Notes: Patient is due for a mammogram and a DEXA.  Orders have been placed today.  Patient is up to date on all other health maintenance with no concerns to address today.

## 2023-08-09 ENCOUNTER — Other Ambulatory Visit: Payer: Self-pay | Admitting: Family Medicine

## 2023-08-10 ENCOUNTER — Other Ambulatory Visit (HOSPITAL_COMMUNITY): Payer: Self-pay

## 2023-08-10 ENCOUNTER — Other Ambulatory Visit: Payer: Self-pay

## 2023-08-10 ENCOUNTER — Other Ambulatory Visit: Payer: Self-pay | Admitting: Family Medicine

## 2023-08-10 DIAGNOSIS — R269 Unspecified abnormalities of gait and mobility: Secondary | ICD-10-CM | POA: Diagnosis not present

## 2023-08-10 DIAGNOSIS — M7062 Trochanteric bursitis, left hip: Secondary | ICD-10-CM | POA: Diagnosis not present

## 2023-08-10 DIAGNOSIS — M6281 Muscle weakness (generalized): Secondary | ICD-10-CM | POA: Diagnosis not present

## 2023-08-10 MED ORDER — ROSUVASTATIN CALCIUM 5 MG PO TABS
5.0000 mg | ORAL_TABLET | ORAL | 3 refills | Status: DC
  Filled 2023-08-10: qty 12, 84d supply, fill #0

## 2023-08-10 MED ORDER — VITAMIN D (ERGOCALCIFEROL) 1.25 MG (50000 UNIT) PO CAPS
50000.0000 [IU] | ORAL_CAPSULE | ORAL | 1 refills | Status: AC
  Filled 2023-08-10: qty 12, 84d supply, fill #0
  Filled 2023-11-24: qty 12, 84d supply, fill #1
  Filled 2024-03-09: qty 2, 14d supply, fill #2

## 2023-08-11 ENCOUNTER — Encounter: Payer: Self-pay | Admitting: Family Medicine

## 2023-08-11 ENCOUNTER — Ambulatory Visit: Payer: Self-pay | Admitting: Family Medicine

## 2023-08-11 ENCOUNTER — Ambulatory Visit (INDEPENDENT_AMBULATORY_CARE_PROVIDER_SITE_OTHER): Payer: Medicare PPO | Admitting: Family Medicine

## 2023-08-11 ENCOUNTER — Other Ambulatory Visit: Payer: Self-pay

## 2023-08-11 ENCOUNTER — Other Ambulatory Visit (HOSPITAL_COMMUNITY): Payer: Self-pay

## 2023-08-11 VITALS — BP 130/80 | HR 60 | Temp 97.2°F | Ht 67.0 in | Wt 144.8 lb

## 2023-08-11 DIAGNOSIS — I480 Paroxysmal atrial fibrillation: Secondary | ICD-10-CM

## 2023-08-11 DIAGNOSIS — I700 Atherosclerosis of aorta: Secondary | ICD-10-CM

## 2023-08-11 DIAGNOSIS — R202 Paresthesia of skin: Secondary | ICD-10-CM | POA: Diagnosis not present

## 2023-08-11 DIAGNOSIS — Z0000 Encounter for general adult medical examination without abnormal findings: Secondary | ICD-10-CM | POA: Diagnosis not present

## 2023-08-11 DIAGNOSIS — E7800 Pure hypercholesterolemia, unspecified: Secondary | ICD-10-CM

## 2023-08-11 DIAGNOSIS — J441 Chronic obstructive pulmonary disease with (acute) exacerbation: Secondary | ICD-10-CM | POA: Diagnosis not present

## 2023-08-11 DIAGNOSIS — F17200 Nicotine dependence, unspecified, uncomplicated: Secondary | ICD-10-CM

## 2023-08-11 DIAGNOSIS — E559 Vitamin D deficiency, unspecified: Secondary | ICD-10-CM | POA: Diagnosis not present

## 2023-08-11 LAB — URINALYSIS, ROUTINE W REFLEX MICROSCOPIC
Bilirubin Urine: NEGATIVE
Hgb urine dipstick: NEGATIVE
Ketones, ur: NEGATIVE
Leukocytes,Ua: NEGATIVE
Nitrite: NEGATIVE
Specific Gravity, Urine: 1.01 (ref 1.000–1.030)
Total Protein, Urine: NEGATIVE
Urine Glucose: NEGATIVE
Urobilinogen, UA: 1 (ref 0.0–1.0)
pH: 7 (ref 5.0–8.0)

## 2023-08-11 LAB — CBC WITH DIFFERENTIAL/PLATELET
Basophils Absolute: 0 K/uL (ref 0.0–0.1)
Basophils Relative: 0.6 % (ref 0.0–3.0)
Eosinophils Absolute: 0.1 K/uL (ref 0.0–0.7)
Eosinophils Relative: 1.7 % (ref 0.0–5.0)
HCT: 41.2 % (ref 36.0–46.0)
Hemoglobin: 13.8 g/dL (ref 12.0–15.0)
Lymphocytes Relative: 35.1 % (ref 12.0–46.0)
Lymphs Abs: 1.6 K/uL (ref 0.7–4.0)
MCHC: 33.5 g/dL (ref 30.0–36.0)
MCV: 95.1 fl (ref 78.0–100.0)
Monocytes Absolute: 0.4 K/uL (ref 0.1–1.0)
Monocytes Relative: 8.3 % (ref 3.0–12.0)
Neutro Abs: 2.5 K/uL (ref 1.4–7.7)
Neutrophils Relative %: 54.3 % (ref 43.0–77.0)
Platelets: 165 K/uL (ref 150.0–400.0)
RBC: 4.33 Mil/uL (ref 3.87–5.11)
RDW: 13.2 % (ref 11.5–15.5)
WBC: 4.6 K/uL (ref 4.0–10.5)

## 2023-08-11 LAB — COMPREHENSIVE METABOLIC PANEL WITH GFR
ALT: 10 U/L (ref 0–35)
AST: 16 U/L (ref 0–37)
Albumin: 4.4 g/dL (ref 3.5–5.2)
Alkaline Phosphatase: 67 U/L (ref 39–117)
BUN: 12 mg/dL (ref 6–23)
CO2: 30 meq/L (ref 19–32)
Calcium: 9.5 mg/dL (ref 8.4–10.5)
Chloride: 103 meq/L (ref 96–112)
Creatinine, Ser: 0.8 mg/dL (ref 0.40–1.20)
GFR: 72.41 mL/min (ref >60.00)
Glucose, Bld: 89 mg/dL (ref 70–99)
Potassium: 3.9 meq/L (ref 3.5–5.1)
Sodium: 138 meq/L (ref 135–145)
Total Bilirubin: 0.6 mg/dL (ref 0.2–1.2)
Total Protein: 6.6 g/dL (ref 6.0–8.3)

## 2023-08-11 LAB — LIPID PANEL
Cholesterol: 271 mg/dL — ABNORMAL HIGH (ref 0–200)
HDL: 70.5 mg/dL (ref >39.00)
LDL Cholesterol: 175 mg/dL — ABNORMAL HIGH (ref 0–99)
NonHDL: 200.21
Total CHOL/HDL Ratio: 4
Triglycerides: 125 mg/dL (ref 0.0–149.0)
VLDL: 25 mg/dL (ref 0.0–40.0)

## 2023-08-11 LAB — VITAMIN B12: Vitamin B-12: 212 pg/mL (ref 211–911)

## 2023-08-11 LAB — VITAMIN D 25 HYDROXY (VIT D DEFICIENCY, FRACTURES): VITD: 48.76 ng/mL (ref 30.00–100.00)

## 2023-08-11 LAB — TSH: TSH: 4.25 u[IU]/mL (ref 0.35–5.50)

## 2023-08-11 MED ORDER — ROSUVASTATIN CALCIUM 5 MG PO TABS
5.0000 mg | ORAL_TABLET | ORAL | 3 refills | Status: AC
  Filled 2023-08-11 – 2023-08-13 (×2): qty 26, 91d supply, fill #0
  Filled 2023-08-13: qty 26, 90d supply, fill #0
  Filled 2023-10-29: qty 26, 90d supply, fill #1
  Filled 2024-01-13 – 2024-01-18 (×2): qty 26, 90d supply, fill #2

## 2023-08-11 MED ORDER — ALBUTEROL SULFATE HFA 108 (90 BASE) MCG/ACT IN AERS
2.0000 | INHALATION_SPRAY | Freq: Four times a day (QID) | RESPIRATORY_TRACT | 2 refills | Status: AC | PRN
  Filled 2023-08-11: qty 6.7, 25d supply, fill #0
  Filled 2023-12-28: qty 6.7, 25d supply, fill #1

## 2023-08-11 NOTE — Patient Instructions (Addendum)
 Please stop by lab before you go If you have mychart- we will send your results within 3 business days of us  receiving them.  If you do not have mychart- we will call you about results within 5 business days of us  receiving them.  *please also note that you will see labs on mychart as soon as they post. I will later go in and write notes on them- will say notes from Dr. Katrinka   Only change today is trial rosuvastatin  twice a week with updated prescription   Recommended follow up: Return in about 1 year (around 08/10/2024) for physical or sooner if needed.Schedule b4 you leave.

## 2023-08-11 NOTE — Progress Notes (Signed)
 Phone [phone]   Subjective:  Patient presents today for their annual physical. Chief complaint-noted.   See problem oriented charting- ROS- full  review of systems was completed and negative Per full ROS sheet completed by patient except for topics noted under acute/chronic concerns  The following were reviewed and entered/updated in epic: Past Medical History:  Diagnosis Date   AICD (automatic cardioverter/defibrillator) present    loop recorder   Allergy since young adulthood   seasonal pollen   Anxiety    Anxiety state 12/14/2018   Asthma    Atrial fibrillation (HCC)    s/p ablations   Atrial flutter (HCC)    Cataract surgeries 09/2017  11/2017   COPD (chronic obstructive pulmonary disease) (HCC)    COVID    2022 and 2023   Degenerative arthritis    maybe in my left knee (12/16/2016)   Depression    Dysrhythmia 2010   A-fib   Frozen shoulder    hx bilaterally; had to go thru PT; no problem w/them since I learned exercises (12/16/2016)   GERD (gastroesophageal reflux disease)    Glaucoma    H/O: rheumatic fever 1957   Heart murmur h/o rheumatic fever   High cholesterol    Migraine    getting botox  now; now only 2-3/month (12/16/2016)   PONV (postoperative nausea and vomiting)    with Fentanyl ,  Dilaudid  &  Propofol  (12/16/2016)   Patient Active Problem List   Diagnosis Date Noted   CAP (community acquired pneumonia) 06/23/2022    Priority: High   Paroxysmal atrial fibrillation (HCC) s/p ablation november 2018, 2011 previously 12/16/2016    Priority: High   Tobacco abuse 10/03/2013    Priority: High   COPD (chronic obstructive pulmonary disease) (HCC) 02/01/2009    Priority: High   Glaucoma 01/08/2018    Priority: Medium    Primary localized osteoarthritis of left knee 01/08/2018    Priority: Medium    Vitamin D  deficiency 01/08/2018    Priority: Medium    High cholesterol     Priority: Medium    Intractable chronic migraine without aura  05/17/2014    Priority: Medium    Depression, major, single episode, complete remission (HCC) 02/01/2009    Priority: Medium    Asthma 02/01/2009    Priority: Medium    Nasal injury 10/20/2016    Priority: Low   S/P ablation of atrial fibrillation 05/16/2013    Priority: Low   Atherosclerosis of aorta (HCC) 07/02/2022   Migraine without aura and without status migrainosus, not intractable 12/13/2021   Past Surgical History:  Procedure Laterality Date   ABDOMINAL HYSTERECTOMY  1990   right ovary removed   APPENDECTOMY  1990   ATRIAL FIBRILLATION ABLATION N/A 12/16/2016   Procedure: ATRIAL FIBRILLATION ABLATION;  Surgeon: Kelsie Agent, MD;  Location: [facility];  Service: Cardiovascular;  Laterality: N/A;   CATARACT EXTRACTION, BILATERAL     COSMETIC SURGERY  1967   rhinoplasty   CTI ablation  04/2009   ESOPHAGOGASTRODUODENOSCOPY (EGD) WITH PROPOFOL  N/A 01/06/2013   Procedure: ESOPHAGOGASTRODUODENOSCOPY (EGD) WITH PROPOFOL ;  Surgeon: Lamar LULLA Bunk, MD;  Location: [facility];  Service: Endoscopy;  Laterality: N/A;   EYE SURGERY  catarracts 2019   implantable loop recorder removal  11/05/2020   MDT LINQ removed by Dr Kelsie   JOINT REPLACEMENT  November 2020   L knee   KNEE ARTHROSCOPY Bilateral 1999-2006   KNEE SURGERY Bilateral 8039,8036   open; for congenital deformity   LOOP  RECORDER INSERTION N/A 10/02/2016   Procedure: LOOP RECORDER INSERTION;  Surgeon: Kelsie Agent, MD;  Location: [facility];  Service: Cardiovascular;  Laterality: N/A;   PERIPHERAL VASCULAR INTERVENTION  2/11  & 6/11   RHINOPLASTY  1965   TONSILLECTOMY  as child   TOOTH EXTRACTION  2009   2 teeth extractted   TOTAL KNEE ARTHROPLASTY Left 12/27/2018   Procedure: TOTAL KNEE ARTHROPLASTY;  Surgeon: Jane Charleston, MD;  Location: [facility];  Service: Orthopedics;  Laterality: Left;    Family History  Problem Relation Age of Onset   Cancer Mother        renal cell   Migraines  Mother    Anxiety disorder Mother    Dementia Father    Heart disease Father        mid 97s cabg   Hyperlipidemia Father    Other Brother        died 30- out for run- no autopsy   Hyperlipidemia Brother        doesnt tolerate statin- now 57   Hyperlipidemia Brother    Breast cancer Neg Hx     Medications- reviewed and updated Current Outpatient Medications  Medication Sig Dispense Refill   ALPRAZolam  (XANAX ) 0.5 MG tablet Take 1 tablet (0.5 mg total) by mouth 2 (two) times daily as needed for anxiety 60 tablet 2   apixaban  (ELIQUIS ) 5 MG TABS tablet Take 1 tablet (5 mg total) by mouth 2 (two) times daily. 180 tablet 3   buPROPion  (WELLBUTRIN  XL) 300 MG 24 hr tablet Take 1 tablet by mouth daily 90 tablet 0   butalbital -acetaminophen -caffeine  (FIORICET ) 50-325-40 MG tablet Take 1 tablet by mouth every 6 (six) hours as needed. 3 month supply 30 tablet 0   dorzolamide -timolol  (COSOPT ) 2-0.5 % ophthalmic solution PLACE 1 DROP IN BOTH EYES TWO TIMES DAILY 20 mL 4   Galcanezumab -gnlm (EMGALITY ) 120 MG/ML SOAJ Inject 120 mg as directed every 30 days. 3 mL 4   promethazine  (PHENERGAN ) 25 MG tablet Take 1 tablet (25 mg total) by mouth every 6 (six) hours as needed for nausea or vomiting. 20 tablet 1   REFRESH TEARS PF 0.5-0.9 % SOLN Place 1 drop into both eyes 3 (three) times daily as needed (for dryness).     Rimegepant Sulfate (NURTEC) 75 MG TBDP Take 1 tablet (75 mg total) by mouth daily as needed.     rosuvastatin  (CRESTOR ) 5 MG tablet Take 1 tablet (5 mg total) by mouth once a week. 13 tablet 3   senna (SENOKOT) 8.6 MG tablet Take 1 tablet by mouth daily as needed for constipation.     sertraline  (ZOLOFT ) 100 MG tablet Take 1 tablet (100 mg total) by mouth daily. 90 tablet 0   Simethicone  (GAS-X PO) Take 2 tablets daily as needed by mouth (gas).     sodium chloride  (OCEAN) 0.65 % SOLN nasal spray Place 1 spray as needed into both nostrils for congestion.     SUMAtriptan  (IMITREX ) 100 MG  tablet TAKE 1 TABLET BY MOUTH AT THE ONSET OF A HEADACHE DAILY AS NEEDED (should last 90 days) 27 tablet 3   timolol  (TIMOPTIC ) 0.25 % ophthalmic solution 1 drop into each eye Ophthalmic twice a day     Vitamin D , Ergocalciferol , (DRISDOL ) 1.25 MG (50000 UNIT) CAPS capsule Take 1 capsule (50,000 Units total) by mouth every 7 (seven) days. 13 capsule 1   acetaminophen  (TYLENOL ) 325 MG tablet Take 325 mg 2 (two) times daily as needed  by mouth (pain).      albuterol  (VENTOLIN  HFA) 108 (90 Base) MCG/ACT inhaler INHALE 2 PUFFS INTO THE LUNGS EVERY 6 HOURS AS NEEDED FOR WHEEZING OR SHORTNESS OF BREATH. 6.7 g 2   Probiotic Product (ALIGN) 4 MG CAPS Take 4 mg by mouth See admin instructions. Take 4 mg by mouth once a day when taking antibiotics (Patient taking differently: Take 4 mg by mouth See admin instructions. Take 4 mg by mouth once a day PRN when taking antibiotics)     No current facility-administered medications for this visit.    Allergies-reviewed and updated Allergies  Allergen Reactions   Dilaudid  [Hydromorphone  Hcl] Nausea And Vomiting   Fentanyl  Nausea And Vomiting, Other (See Comments) and Nausea Only   Ciprofloxacin Rash   Erythromycin Rash   Percocet [Oxycodone -Acetaminophen ] Nausea And Vomiting    Social History   Social History Narrative   Lives at home w/ husband and  dog(s)2025   Married 1984. No children.    Retired [employer] fundraiser      Enjoys reading- history, politics, classics, etc   Caffeine : 1 cup/day   Objective  Objective:  BP 130/80   Pulse 60   Temp (!) 97.2 F (36.2 C)   Ht 5' 7 (1.702 m)   Wt 144 lb 12.8 oz (65.7 kg)   SpO2 95%   BMI 22.68 kg/m  Gen: NAD, resting comfortably HEENT: Mucous membranes are moist. Oropharynx normal Neck: no thyromegaly CV: RRR no murmurs rubs or gallops Lungs: CTAB no crackles, wheeze, rhonchi Abdomen: soft/nontender/nondistended/normal bowel sounds. No rebound or guarding.  Ext: no edema Skin: warm, dry Neuro: grossly  normal, moves all extremities, PERRLA   Assessment and Plan   73 y.o. female presenting for annual physical.  Health Maintenance counseling: 1. Anticipatory guidance: Patient counseled regarding regular dental exams -q6 months, eye exams - regular follow up with eye doc due to glaucoma,  avoiding smoking and second hand smoke- see below , limiting alcohol to 1 beverage per day- rare sips as migraine trigger , no illicit drugs .   2. Risk factor reduction:  Advised patient of need for regular exercise and diet rich and fruits and vegetables to reduce risk of heart attack and stroke.  Exercise- did PREP program through February of this year with [employer] then fell off with travel and trying to restart but some orthopedic barriers- trying to do physical therapy to work through these and get back on track.  Diet/weight management-tries to eat reasonably healthy and maintains healthy weight.  Wt Readings from Last 3 Encounters:  08/11/23 144 lb 12.8 oz (65.7 kg)  08/06/23 147 lb (66.7 kg)  07/09/23 147 lb (66.7 kg)   3. Immunizations/screenings/ancillary studies- holding on Prevnar 20 and otherwise up to date- stays up to date on COVID shots. 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- is considering updating at pharmacy- declines titers Immunization History  Administered Date(s) Administered   Fluad Quad(high Dose 65+) 10/19/2020   Influenza, High Dose Seasonal PF 11/08/2013, 10/10/2016, 09/24/2018, 10/06/2019, 11/07/2021, 10/27/2022   Influenza,inj,quad, With Preservative 11/07/2014   Influenza-Unspecified 10/12/2016, 10/29/2017, 10/06/2019, 10/19/2020   PFIZER(Purple Top)SARS-COV-2 Vaccination 02/27/2019, 03/21/2019, 11/04/2019, 06/12/2020, 10/30/2020   Pfizer(Comirnaty)Fall Seasonal Vaccine 12 years and older 10/27/2022, 07/08/2023   Pneumococcal Conjugate-13 03/27/2016   Pneumococcal Polysaccharide-23 12/20/2013   Respiratory Syncytial Virus Vaccine,Recomb Aduvanted(Arexvy) 11/07/2021   Tdap  02/02/2013, 09/02/2020   Zoster Recombinant(Shingrix) 10/10/2022, 12/15/2022  4. Cervical cancer screening- saw Dr. Linnell this spring- no concerns - past age based  screening recommendations  5. Breast cancer screening-  breast exam with GYN and mammogram 10/28/22 and plans for annual 6. Colon cancer screening - 08/14/20 with 10 year repeat with Dr. Donnald and likely graduating but we can discuss when reaches 2032 7. Skin cancer screening- follow up with dermatology as needed. advised regular sunscreen use. Denies worrisome, changing, or new skin lesions.  8. Birth control/STD check- only active with husband/postmenopausal 9. Osteoporosis screening at 60- plans to update bone density at the breast center with next mammogram- order in place so should be able to simply schedule when she calls- previously wanted to hold off- last one 2020 10. Smoking associated screening - current smoker 3-5 cigarettes per day. Does not want to quit- enjoys smoking. Still in lung cancer screening program and we will get UA   Status of chronic or acute concerns   #working with physical therapy and making progress with hip to the knee- set up through [facility]- prior Dr. Jane  # Atrial fibrillation- sees Dr. Inocencio S: Rate controlled without medicine- seems to mainly stay out of atrial fibrillation  Anticoagulated with Eliquis  5 mg twice daily Chadsvasc score of 2 per cardiology A/P: appropriately anticoagulated and rate controlled- continue current medicine    # COPD/asthma/allergies S: Maintenance medications: has tried twice without help.None from pulmonary perspective.   In spring takes as needed medicines.   Patient has had to use albuterol  only with triggers like allergens in spring A/P: overall doing well- continue current medications - seems to help for copd/asthma/allergies  % Depression/anxiety -follows with psychiatric nurse practitioner S: Medication: Sertraline  100 mg, bupropion   300Mg , Xanax  0.5Mg     08/06/2023   11:21 AM 07/02/2022   11:40 AM 05/05/2022   11:12 AM  Depression screen PHQ 2/9  Decreased Interest 0 0 0  Down, Depressed, Hopeless 0 0 0  PHQ - 2 Score 0 0 0  Altered sleeping 0 0   Tired, decreased energy 0 0   Change in appetite 0 0   Feeling bad or failure about yourself  0 0   Trouble concentrating 0 0   Moving slowly or fidgety/restless 0 0   Suicidal thoughts 0 0   PHQ-9 Score 0 0   Difficult doing work/chores Not difficult at all Not difficult at all   A/P: full remission - continue current medications - manageable   #Vitamin D  deficiency S: Medication: Has required high-dose in the past- refilled just yesterday- had run out in may- was taking 2000 in interim- off for 7 weeks. Does better with the once a week for consistency sake -Patient reports aching in her toes on vitamin D  in the past Lab Results  Component Value Date   VD25OH 13.16 (L) 07/02/2022  A/P: update vitamin D  and see where things stand   #hyperlipidemia/aortic atherosclerosis-not accounting for aortic atherosclerosis 10-year ASCVD risk above 15% in 2020 S: Brother with significant myalgias on statins and patient is concerned about trialing-she is more interested in fish oil or Vascepa Medication:  headache(s) on rosuvastatin  2 days a week but tolerating once a week- but now on emgality  and may be able to go back to twice a week Lab Results  Component Value Date   CHOL 224 (H) 08/05/2022   HDL 63.50 08/05/2022   LDLCALC 134 (H) 08/05/2022   TRIG 132.0 08/05/2022   CHOLHDL 4 08/05/2022   A/P: aortic atherosclerosis (presumed stable)- LDL goal ideally <70 - update lipids- but suspect she can tolerate twice a  week and wants to go ahead and try- send in now as emgality  helping migraines -wants to test lpa as well- reasonable and ordered   % Headaches-follows closely with [facility]- prior DrRONITA Bull. on emgality .  fiorcet or sumatriptan  as needed or Nurtec most  recently - but has not even had migraine yet. botox  injections. Phenergan  for nausea portion   #Urgency and diarrhea at times- wonders if onions as trigger.  No blood in stool or melena.  Continued issues since 2014.  Is up-to-date on colonoscopy   #liver nodularity noted on ct lung cancer screening - prior us  10/26/19 normal -elastography low risk 08/26/2022  #paresthesias in toes as well as coolness- but good pulses- check b12  Recommended follow up: Return in about 1 year (around 08/10/2024) for physical or sooner if needed.Schedule b4 you leave. Future Appointments  Date Time Provider Department Center  07/14/2024  2:15 PM [facility] PARAS, NP [facility] None  08/10/2024 10:40 AM [facility] ANNUAL WELLNESS VISIT 1 [facility]   Lab/Order associations: fasting   ICD-10-CM   1. Preventative health care  Z00.00     2. Chronic obstructive pulmonary disease with acute exacerbation (HCC)  J44.1     3. Paroxysmal atrial fibrillation [facility]) s/p ablation november 2018, 2011 previously  I48.0     4. High cholesterol  E78.00     5. Vitamin D  deficiency  E55.9     6. Atherosclerosis of aorta (HCC)  I70.0     7. Current smoker  F17.200     8. Paresthesias  R20.2       No orders of the defined types were placed in this encounter.   Return precautions advised.  Garnette Lukes, MD

## 2023-08-12 ENCOUNTER — Other Ambulatory Visit (HOSPITAL_COMMUNITY): Payer: Self-pay

## 2023-08-13 ENCOUNTER — Other Ambulatory Visit (HOSPITAL_COMMUNITY): Payer: Self-pay

## 2023-08-13 LAB — LIPOPROTEIN A (LPA): Lipoprotein (a): <10 nmol/L (ref <75)

## 2023-08-14 ENCOUNTER — Other Ambulatory Visit (HOSPITAL_COMMUNITY): Payer: Self-pay

## 2023-08-14 ENCOUNTER — Telehealth: Payer: Self-pay

## 2023-08-14 NOTE — Telephone Encounter (Signed)
 Please call pharmacy-these are on her medication list-I did not remove or canceled them.  Once you have an update please call patient

## 2023-08-14 NOTE — Telephone Encounter (Signed)
 Copied from CRM [id number]. Topic: Clinical - Medication Question >> Aug 13, 2023  3:00 PM Mia F wrote: Reason for CRM: Pt called stating her pharmacy informed her that Dr Katrinka had cancelled her ALPRAZolam  & sertraline  rx's. She says she reached out to the other dr and that dr said she did not cancel. Pt is wanting to know why and how can Dr Katrinka cancelled what another dr wrote. Spoke to office and was asked to put in message.  Please see pt call note and advise on medications

## 2023-08-14 NOTE — Telephone Encounter (Signed)
 Called [facility] and spoke with Glendia and was advised appt on 08/11/23 most recent Rx's were discontinued versus older prescriptions during medication list clean up. This has since been corrected and pt advised. Pt understanding to this mistake and happy this has been resolved.

## 2023-08-17 DIAGNOSIS — R269 Unspecified abnormalities of gait and mobility: Secondary | ICD-10-CM | POA: Diagnosis not present

## 2023-08-17 DIAGNOSIS — M6281 Muscle weakness (generalized): Secondary | ICD-10-CM | POA: Diagnosis not present

## 2023-08-17 DIAGNOSIS — M7062 Trochanteric bursitis, left hip: Secondary | ICD-10-CM | POA: Diagnosis not present

## 2023-08-24 DIAGNOSIS — R269 Unspecified abnormalities of gait and mobility: Secondary | ICD-10-CM | POA: Diagnosis not present

## 2023-08-24 DIAGNOSIS — M7062 Trochanteric bursitis, left hip: Secondary | ICD-10-CM | POA: Diagnosis not present

## 2023-08-24 DIAGNOSIS — M6281 Muscle weakness (generalized): Secondary | ICD-10-CM | POA: Diagnosis not present

## 2023-09-13 ENCOUNTER — Other Ambulatory Visit (HOSPITAL_COMMUNITY): Payer: Self-pay

## 2023-09-14 ENCOUNTER — Other Ambulatory Visit (HOSPITAL_COMMUNITY): Payer: Self-pay

## 2023-09-14 ENCOUNTER — Other Ambulatory Visit: Payer: Self-pay

## 2023-09-16 ENCOUNTER — Ambulatory Visit (HOSPITAL_BASED_OUTPATIENT_CLINIC_OR_DEPARTMENT_OTHER)
Admission: RE | Admit: 2023-09-16 | Discharge: 2023-09-16 | Disposition: A | Discharge status: Home / Self Care | Source: Ambulatory Visit | Attending: Family Medicine | Admitting: Family Medicine

## 2023-09-16 DIAGNOSIS — M810 Age-related osteoporosis without current pathological fracture: Secondary | ICD-10-CM | POA: Insufficient documentation

## 2023-09-16 DIAGNOSIS — Z13820 Encounter for screening for osteoporosis: Secondary | ICD-10-CM | POA: Insufficient documentation

## 2023-09-16 DIAGNOSIS — Z8739 Personal history of other diseases of the musculoskeletal system and connective tissue: Secondary | ICD-10-CM

## 2023-09-16 DIAGNOSIS — Z780 Asymptomatic menopausal state: Secondary | ICD-10-CM | POA: Diagnosis not present

## 2023-09-17 ENCOUNTER — Ambulatory Visit: Payer: Self-pay | Admitting: Family Medicine

## 2023-09-18 ENCOUNTER — Other Ambulatory Visit: Payer: Self-pay | Admitting: Family Medicine

## 2023-09-18 DIAGNOSIS — M810 Age-related osteoporosis without current pathological fracture: Secondary | ICD-10-CM

## 2023-09-21 DIAGNOSIS — H401132 Primary open-angle glaucoma, bilateral, moderate stage: Secondary | ICD-10-CM | POA: Diagnosis not present

## 2023-09-24 ENCOUNTER — Other Ambulatory Visit: Payer: Self-pay

## 2023-09-24 ENCOUNTER — Other Ambulatory Visit (HOSPITAL_COMMUNITY): Payer: Self-pay

## 2023-09-24 DIAGNOSIS — F331 Major depressive disorder, recurrent, moderate: Secondary | ICD-10-CM | POA: Diagnosis not present

## 2023-09-24 DIAGNOSIS — F411 Generalized anxiety disorder: Secondary | ICD-10-CM | POA: Diagnosis not present

## 2023-09-24 MED ORDER — ALPRAZOLAM 0.5 MG PO TABS
0.5000 mg | ORAL_TABLET | Freq: Two times a day (BID) | ORAL | 2 refills | Status: DC
  Filled 2023-09-24: qty 60, 30d supply, fill #0
  Filled 2023-11-24: qty 60, 30d supply, fill #1

## 2023-09-24 MED ORDER — BUPROPION HCL ER (XL) 300 MG PO TB24
300.0000 mg | ORAL_TABLET | Freq: Every day | ORAL | 0 refills | Status: AC
  Filled 2023-09-24 – 2023-12-11 (×2): qty 90, 90d supply, fill #0

## 2023-09-24 MED ORDER — SERTRALINE HCL 100 MG PO TABS
100.0000 mg | ORAL_TABLET | Freq: Every day | ORAL | 0 refills | Status: DC
  Filled 2023-09-24: qty 81, 81d supply, fill #0

## 2023-10-12 ENCOUNTER — Other Ambulatory Visit: Payer: Self-pay | Admitting: Neurology

## 2023-10-12 ENCOUNTER — Telehealth (HOSPITAL_COMMUNITY): Payer: Self-pay

## 2023-10-12 ENCOUNTER — Other Ambulatory Visit (HOSPITAL_COMMUNITY): Payer: Self-pay

## 2023-10-12 ENCOUNTER — Other Ambulatory Visit (HOSPITAL_BASED_OUTPATIENT_CLINIC_OR_DEPARTMENT_OTHER): Payer: Self-pay

## 2023-10-12 ENCOUNTER — Encounter: Payer: Self-pay | Admitting: Neurology

## 2023-10-12 MED ORDER — BUTALBITAL-APAP-CAFFEINE 50-325-40 MG PO TABS
1.0000 | ORAL_TABLET | Freq: Four times a day (QID) | ORAL | 0 refills | Status: DC | PRN
  Filled 2023-10-12: qty 30, 8d supply, fill #0

## 2023-10-12 MED ORDER — NURTEC 75 MG PO TBDP
75.0000 mg | ORAL_TABLET | ORAL | 11 refills | Status: AC | PRN
  Filled 2023-10-12: qty 8, 30d supply, fill #0
  Filled 2023-10-13: qty 8, 15d supply, fill #0
  Filled 2023-10-14: qty 8, 30d supply, fill #0
  Filled 2023-10-20: qty 8, 8d supply, fill #0
  Filled 2024-01-13: qty 8, 30d supply, fill #1
  Filled 2024-01-19: qty 8, 8d supply, fill #1

## 2023-10-12 NOTE — Telephone Encounter (Signed)
 Meds ordered this encounter  Medications   Rimegepant Sulfate  (NURTEC) 75 MG TBDP    Sig: Take 1 tablet (75 mg total) by mouth as needed. Take 1 tablet at onset of headache, max is 1 tablet in 24 hours.    Dispense:  8 tablet    Refill:  11

## 2023-10-12 NOTE — Telephone Encounter (Signed)
 PA request has been Received. New Encounter has been or will be created for follow up. For additional info see Pharmacy Prior Auth telephone encounter from 10/12/23.

## 2023-10-12 NOTE — Telephone Encounter (Signed)
 Requested Prescriptions   Pending Prescriptions Disp Refills   butalbital -acetaminophen -caffeine  (FIORICET ) 50-325-40 MG tablet 30 tablet 0    Sig: Take 1 tablet by mouth every 6 (six) hours as needed. 3 month supply   Last seen 07/09/23 Next appt 07/14/24  Dispenses   Dispensed Days Supply Quantity Provider Pharmacy  butalbital -acetaminophen -caffeine  (FIORICET ) 50-325-40 MG tablet 07/10/2023 84 30 tablet [place] Lauraine PARAS, NP [place] [facility]...  butalbital -acetaminophen -caffeine  (FIORICET ) 50-325-40 MG tablet 04/15/2023 8 30 tablet [place] Lauraine PARAS, NP [place] [facility]...  butalbital -acetaminophen -caffeine  (FIORICET ) 50-325-40 MG tablet 01/13/2023 30 30 tablet [place] Lauraine PARAS, NP [place] [facility].[email or web address]

## 2023-10-13 ENCOUNTER — Other Ambulatory Visit (HOSPITAL_COMMUNITY): Payer: Self-pay

## 2023-10-13 ENCOUNTER — Other Ambulatory Visit: Payer: Self-pay

## 2023-10-14 ENCOUNTER — Other Ambulatory Visit (HOSPITAL_COMMUNITY): Payer: Self-pay

## 2023-10-15 ENCOUNTER — Encounter (HOSPITAL_COMMUNITY): Payer: Self-pay

## 2023-10-15 ENCOUNTER — Other Ambulatory Visit (HOSPITAL_COMMUNITY): Payer: Self-pay

## 2023-10-19 ENCOUNTER — Other Ambulatory Visit (HOSPITAL_COMMUNITY): Payer: Self-pay

## 2023-10-19 ENCOUNTER — Telehealth: Payer: Self-pay

## 2023-10-19 NOTE — Telephone Encounter (Signed)
 Pharmacy Patient Advocate Encounter   Received notification from Patient Advice Request messages that prior authorization for Nurtec is required/requested.   Insurance verification completed.   The patient is insured through [place] .   Per test claim: PA required; PA submitted to above mentioned insurance via Latent Key/confirmation #/EOC [id number] Status is pending

## 2023-10-20 ENCOUNTER — Other Ambulatory Visit (HOSPITAL_COMMUNITY): Payer: Self-pay

## 2023-10-20 NOTE — Telephone Encounter (Signed)
 Pharmacy Patient Advocate Encounter  Received notification from [employer] that Prior Authorization for Nurtec has been APPROVED from 10/19/23 to 02/03/24. Ran test claim, Copay is $0. This test claim was processed through [facility]- copay amounts may vary at other pharmacies due to pharmacy/plan contracts, or as the patient moves through the different stages of their insurance plan.   PA #/Case ID/Reference #: [id number]

## 2023-10-22 ENCOUNTER — Encounter: Payer: Self-pay | Admitting: Family Medicine

## 2023-10-30 ENCOUNTER — Ambulatory Visit
Admission: RE | Admit: 2023-10-30 | Discharge: 2023-10-30 | Disposition: A | Discharge status: Home / Self Care | Source: Ambulatory Visit | Attending: Family Medicine | Admitting: Family Medicine

## 2023-10-30 DIAGNOSIS — Z1231 Encounter for screening mammogram for malignant neoplasm of breast: Secondary | ICD-10-CM

## 2023-11-09 ENCOUNTER — Encounter: Payer: Self-pay | Admitting: Physician Assistant

## 2023-11-09 ENCOUNTER — Ambulatory Visit: Admitting: Physician Assistant

## 2023-11-09 VITALS — Ht 66.0 in | Wt 144.6 lb

## 2023-11-09 DIAGNOSIS — M810 Age-related osteoporosis without current pathological fracture: Secondary | ICD-10-CM | POA: Diagnosis not present

## 2023-11-09 NOTE — Progress Notes (Signed)
 Office Visit Note   Patient: Suzanne [name]           Date of Birth: [birth year]           MRN: [id number] Visit Date: 11/09/2023              Requested by: Katrinka Garnette KIDD, MD [address] [place],  [place] [address] PCP: Katrinka Garnette KIDD, MD   Assessment & Plan: Visit Diagnoses:  1. Age-related osteoporosis without current pathological fracture     Plan: Suzanne [name] is a pleasant 73 year old woman who is a retired Engineer, civil (consulting) and comes here with her husband who is a retired Engineer, civil (consulting) and referral from Dr. Katrinka for evaluation of osteoporosis.  She was previously on Fosamax in the past but had difficulties severe enough not to tolerate this well.  She has no history of a spine hip or wrist fracture.  She has a had a ablation for A-fib in 2019 has not had any problems since.  She also has a history of increased cholesterol.  She has never had cancer or kidney disease.  She has no history of ulcers gastric by effects or reflux though did not tolerate Fosamax at all.  She has no history of epilepsy or seizures.  She had a hysterectomy when she was about 38.  She was started on hormone replacement therapy and then taken off of it.  She is a current smoker at 50+ years.  She does not drink.  She does walk and use resistance bands.  She had a crown done recently but has had no problem with this.  She does have history of fractures of the hip in both her mother and father.  She most recent bone density showed a -3.5 at her femur.  She is at extreme risk for fracture.  Calculating her FRAX score she is at a 63% chance of major osteoporotic fracture and a 57% chance of a hip fracture in the next 10 years.  She actually doing fairly well with regards to diet and exercise.  I did give her information about calcium  supplementation and vitamin D .  Also discussed continuing with some resistance training as she is currently doing.  I think it is medically necessary for her to go on some type of anabolic medication  given her FRAX score and her bone density scores.  I would recommend either Evenity or Tymlos.  She is going to research this information with her husband and let me know which one she would probably like to do.  She understands the risks with these medications including avascular necrosis of the jaw bone pain and calcium  issues.  All of her questions were answered she may contact me at any time she is very much interested in going forward she just wants to decide which medication she prefer  Follow-Up Instructions: Return if symptoms worsen or fail to improve.   Orders:  No orders of the defined types were placed in this encounter.  No orders of the defined types were placed in this encounter.     Procedures: No procedures performed   Clinical Data: No additional findings.   Subjective: No chief complaint on file.   HPI patient is a pleasant 73 year old woman who comes in today in referral from Dr. Katrinka for evaluation of osteoporosis.  Review of Systems  All other systems reviewed and are negative.    Objective: Vital Signs: Ht 5' 6 (1.676 m)   Wt 144 lb 9.6 oz (65.6 kg)  BMI 23.34 kg/m   Physical Exam Constitutional:      Appearance: Normal appearance.  Pulmonary:     Effort: Pulmonary effort is normal.  Skin:    General: Skin is warm and dry.  Neurological:     General: No focal deficit present.     Mental Status: She is alert and oriented to person, place, and time.  Psychiatric:        Mood and Affect: Mood normal.        Behavior: Behavior normal.       Specialty Comments:  No specialty comments available.  Imaging: No results found.   PMFS History: Patient Active Problem List   Diagnosis Date Noted   Age-related osteoporosis without current pathological fracture 11/09/2023   Atherosclerosis of aorta 07/02/2022   CAP (community acquired pneumonia) 06/23/2022   Migraine without aura and without status migrainosus, not intractable 12/13/2021    Glaucoma 01/08/2018   Primary localized osteoarthritis of left knee 01/08/2018   Vitamin D  deficiency 01/08/2018   High cholesterol    Paroxysmal atrial fibrillation (HCC) s/p ablation november 2018, 2011 previously 12/16/2016   Nasal injury 10/20/2016   Intractable chronic migraine without aura 05/17/2014   Tobacco abuse 10/03/2013   S/P ablation of atrial fibrillation 05/16/2013   Depression, major, single episode, complete remission (HCC) 02/01/2009   Asthma 02/01/2009   COPD (chronic obstructive pulmonary disease) (HCC) 02/01/2009   Past Medical History:  Diagnosis Date   AICD (automatic cardioverter/defibrillator) present    loop recorder   Allergy since young adulthood   seasonal pollen   Anxiety    Anxiety state 12/14/2018   Asthma    Atrial fibrillation (HCC)    s/p ablations   Atrial flutter (HCC)    Cataract surgeries 09/2017  11/2017   COPD (chronic obstructive pulmonary disease) (HCC)    COVID    2022 and 2023   Degenerative arthritis    maybe in my left knee (12/16/2016)   Depression    Dysrhythmia 2010   A-fib   Frozen shoulder    hx bilaterally; had to go thru PT; no problem w/them since I learned exercises (12/16/2016)   GERD (gastroesophageal reflux disease)    Glaucoma    H/O: rheumatic fever 1957   Heart murmur h/o rheumatic fever   High cholesterol    Migraine    getting botox  now; now only 2-3/month (12/16/2016)   PONV (postoperative nausea and vomiting)    with Fentanyl ,  Dilaudid  &  Propofol  (12/16/2016)    Family History  Problem Relation Age of Onset   Cancer Mother        renal cell   Migraines Mother    Anxiety disorder Mother    Dementia Father    Heart disease Father        mid 59s cabg   Hyperlipidemia Father    Other Brother        died 80- out for run- no autopsy   Hyperlipidemia Brother        doesnt tolerate statin- now 9   Hyperlipidemia Brother    Breast cancer Neg Hx     Past Surgical History:  Procedure  Laterality Date   ABDOMINAL HYSTERECTOMY  1990   right ovary removed   APPENDECTOMY  1990   ATRIAL FIBRILLATION ABLATION N/A 12/16/2016   Procedure: ATRIAL FIBRILLATION ABLATION;  Surgeon: Kelsie Agent, MD;  [facility];  Service: Cardiovascular;  Laterality: N/A;   CATARACT EXTRACTION, BILATERAL  COSMETIC SURGERY  1967   rhinoplasty   CTI ablation  04/2009   ESOPHAGOGASTRODUODENOSCOPY (EGD) WITH PROPOFOL  N/A 01/06/2013   Procedure: ESOPHAGOGASTRODUODENOSCOPY (EGD) WITH PROPOFOL ;  Surgeon: Lamar LULLA Bunk, MD;  Location: [facility];  Service: Endoscopy;  Laterality: N/A;   EYE SURGERY  catarracts 2019   implantable loop recorder removal  11/05/2020   MDT LINQ removed by Dr Kelsie   JOINT REPLACEMENT  November 2020   L knee   KNEE ARTHROSCOPY Bilateral 1999-2006   KNEE SURGERY Bilateral 8039,8036   open; for congenital deformity   LOOP RECORDER INSERTION N/A 10/02/2016   Procedure: LOOP RECORDER INSERTION;  Surgeon: Kelsie Agent, MD;  [facility]: [facility];  Service: Cardiovascular;  Laterality: N/A;   PERIPHERAL VASCULAR INTERVENTION  2/11  & 6/11   RHINOPLASTY  1965   TONSILLECTOMY  as child   TOOTH EXTRACTION  2009   2 teeth extractted   TOTAL KNEE ARTHROPLASTY Left 12/27/2018   Procedure: TOTAL KNEE ARTHROPLASTY;  Surgeon: Jane Lamar, MD;  Location: [facility];  Service: Orthopedics;  Laterality: Left;   Social History   Occupational History   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

## 2023-11-24 ENCOUNTER — Other Ambulatory Visit (HOSPITAL_COMMUNITY): Payer: Self-pay

## 2023-11-25 ENCOUNTER — Other Ambulatory Visit: Payer: Self-pay

## 2023-11-27 ENCOUNTER — Other Ambulatory Visit (HOSPITAL_COMMUNITY): Payer: Self-pay

## 2023-12-07 ENCOUNTER — Encounter: Payer: Self-pay | Admitting: Radiology

## 2023-12-08 NOTE — Progress Notes (Unsigned)
 Cardiology Office Note Date:  12/08/2023  Patient ID:  Suzanne [name], Suzanne [name] 1950-06-13, MRN [id number] PCP:  Katrinka Garnette KIDD, MD  Electrophysiologist: Dr. Kelsie (inactive) >> planned for Dr. Inocencio (not yet seen)    Chief Complaint:  *** annual visit  History of Present Illness: Suzanne [name] is a 73 y.o. female with history of  COPD, HLD, GERD, migraine HAs, OSA AFib/Flutter    I saw her 12/02/21 Since her last visit she traveled to [place], days typically involved walking an average of a day, she felt great until she got COVID, had a headache/fatigue with it, but did well with it, She will get a random sharp pain to the left posterior neck/occipital area, feels better with massage, is brief. She has an infrequent unrelated far left lateral chest pain, also sharp, random, not positional or exertional, no associated symptoms, also brief. No SOB No DOE No dizzy spells, near syncope or syncope. No bleeding She was planned to transition and see Dr. Inocencio at her next visit  I saw her 11/27/22 She is doing very well Traveled in the spring to [place], while there got a terrible PNA Summer another bout of COVID Recovered well from both of, participated in an organized exercise program to get her strength back and did well Back to exercising in general again No CP She does not think she has had any AF since her ablation No bleeding or signs of bleeding She feels best staying on the [facility] No near syncope or syncope No unusual SOB with her baseline COPD Planned to transition to Dr. Inocencio  TODAY  *** eliquis , dose, bleeding *** EP PRN??? > PMD for eliquis  surveillance *** symptoms, burden    AFib/flutter Hx: Diagnosed 2010 Flecainide/amiodarone failed to maintain sinus, seems used very remotely PVI ablation 2011 PVI/CTI ablation 12/16/2016  Past Medical History:  Diagnosis Date   AICD (automatic cardioverter/defibrillator) present    loop recorder    Allergy since young adulthood   seasonal pollen   Anxiety    Anxiety state 12/14/2018   Asthma    Atrial fibrillation (HCC)    s/p ablations   Atrial flutter (HCC)    Cataract surgeries 09/2017  11/2017   COPD (chronic obstructive pulmonary disease) (HCC)    COVID    2022 and 2023   Degenerative arthritis    maybe in my left knee (12/16/2016)   Depression    Dysrhythmia 2010   A-fib   Frozen shoulder    hx bilaterally; had to go thru PT; no problem w/them since I learned exercises (12/16/2016)   GERD (gastroesophageal reflux disease)    Glaucoma    H/O: rheumatic fever 1957   Heart murmur h/o rheumatic fever   High cholesterol    Migraine    getting botox  now; now only 2-3/month (12/16/2016)   PONV (postoperative nausea and vomiting)    with Fentanyl ,  Dilaudid  &  Propofol  (12/16/2016)    Past Surgical History:  Procedure Laterality Date   ABDOMINAL HYSTERECTOMY  1990   right ovary removed   APPENDECTOMY  1990   ATRIAL FIBRILLATION ABLATION N/A 12/16/2016   Procedure: ATRIAL FIBRILLATION ABLATION;  Surgeon: Kelsie Agent, MD;  [facility];  Service: Cardiovascular;  Laterality: N/A;   CATARACT EXTRACTION, BILATERAL     COSMETIC SURGERY  1967   rhinoplasty   CTI ablation  04/2009   ESOPHAGOGASTRODUODENOSCOPY (EGD) WITH PROPOFOL  N/A 01/06/2013   Procedure: ESOPHAGOGASTRODUODENOSCOPY (EGD) WITH PROPOFOL ;  Surgeon:  Lamar LULLA Bunk, MD;  Location: [facility];  Service: Endoscopy;  Laterality: N/A;   EYE SURGERY  catarracts 2019   implantable loop recorder removal  11/05/2020   MDT LINQ removed by Dr Kelsie   JOINT REPLACEMENT  November 2020   L knee   KNEE ARTHROSCOPY Bilateral 1999-2006   KNEE SURGERY Bilateral 8039,8036   open; for congenital deformity   LOOP RECORDER INSERTION N/A 10/02/2016   Procedure: LOOP RECORDER INSERTION;  Surgeon: Kelsie Agent, MD;  [facility];  Service: Cardiovascular;  Laterality: N/A;    PERIPHERAL VASCULAR INTERVENTION  2/11  & 6/11   RHINOPLASTY  1965   TONSILLECTOMY  as child   TOOTH EXTRACTION  2009   2 teeth extractted   TOTAL KNEE ARTHROPLASTY Left 12/27/2018   Procedure: TOTAL KNEE ARTHROPLASTY;  Surgeon: Jane Lamar, MD;  Location: [facility];  Service: Orthopedics;  Laterality: Left;    Current Outpatient Medications  Medication Sig Dispense Refill   acetaminophen  (TYLENOL ) 325 MG tablet Take 325 mg 2 (two) times daily as needed by mouth (pain).      albuterol  (VENTOLIN  HFA) 108 (90 Base) MCG/ACT inhaler INHALE 2 PUFFS INTO THE LUNGS EVERY 6 HOURS AS NEEDED FOR WHEEZING OR SHORTNESS OF BREATH. 6.7 g 2   ALPRAZolam  (XANAX ) 0.5 MG tablet Take 1 tablet (0.5 mg total) by mouth 2 (two) times daily as needed for anxiety 60 tablet 2   ALPRAZolam  (XANAX ) 0.5 MG tablet Take 1 tablet (0.5 mg total) by mouth twice daily as needed for anxiety 60 tablet 2   ALPRAZolam  (XANAX ) 0.5 MG tablet Take 1 tablet (0.5 mg total) by mouth 2 (two) times daily as needed for anxiety 60 tablet 2   apixaban  (ELIQUIS ) 5 MG TABS tablet Take 1 tablet (5 mg total) by mouth 2 (two) times daily. 180 tablet 3   buPROPion  (WELLBUTRIN  XL) 300 MG 24 hr tablet Take 1 tablet (300 mg total) by mouth daily. 90 tablet 0   butalbital -acetaminophen -caffeine  (FIORICET ) 50-325-40 MG tablet Take 1 tablet by mouth every 6 (six) hours as needed. 3 month supply 30 tablet 0   dorzolamide -timolol  (COSOPT ) 2-0.5 % ophthalmic solution PLACE 1 DROP IN BOTH EYES TWO TIMES DAILY 20 mL 4   Galcanezumab -gnlm (EMGALITY ) 120 MG/ML SOAJ Inject 120 mg as directed every 30 days. 3 mL 4   Probiotic Product (ALIGN) 4 MG CAPS Take 4 mg by mouth See admin instructions. Take 4 mg by mouth once a day when taking antibiotics (Patient taking differently: Take 4 mg by mouth See admin instructions. Take 4 mg by mouth once a day PRN when taking antibiotics)     promethazine  (PHENERGAN ) 25 MG tablet Take 1 tablet (25 mg total) by mouth every 6  (six) hours as needed for nausea or vomiting. 20 tablet 1   REFRESH TEARS PF 0.5-0.9 % SOLN Place 1 drop into both eyes 3 (three) times daily as needed (for dryness).     Rimegepant Sulfate  (NURTEC) 75 MG TBDP Take 1 tablet (75 mg total) by mouth daily as needed.     Rimegepant Sulfate  (NURTEC) 75 MG TBDP Take 1 tablet at onset of headache, max 1 tablet in 24 hours. 8 tablet 11   rosuvastatin  (CRESTOR ) 5 MG tablet Take 1 tablet (5 mg total) by mouth 2 (two) times a week. 26 tablet 3   senna (SENOKOT) 8.6 MG tablet Take 1 tablet by mouth daily as needed for constipation.     sertraline  (ZOLOFT ) 100  MG tablet Take 1 tablet (100 mg total) by mouth daily 81 tablet 0   Simethicone  (GAS-X PO) Take 2 tablets daily as needed by mouth (gas).     sodium chloride  (OCEAN) 0.65 % SOLN nasal spray Place 1 spray as needed into both nostrils for congestion.     SUMAtriptan  (IMITREX ) 100 MG tablet TAKE 1 TABLET BY MOUTH AT THE ONSET OF A HEADACHE DAILY AS NEEDED (should last 90 days) 27 tablet 3   timolol  (TIMOPTIC ) 0.25 % ophthalmic solution 1 drop into each eye Ophthalmic twice a day     Vitamin D , Ergocalciferol , (DRISDOL ) 1.25 MG (50000 UNIT) CAPS capsule Take 1 capsule (50,000 Units total) by mouth every 7 (seven) days. 13 capsule 1   No current facility-administered medications for this visit.    Allergies:   Dilaudid  [hydromorphone  hcl], Fentanyl , Ciprofloxacin, Erythromycin, and Percocet [oxycodone -acetaminophen ]   Social History:  The patient  reports that she has been smoking cigarettes. She has a 37.5 pack-year smoking history. She has never used smokeless tobacco. She reports current alcohol use. She reports that she does not use drugs.   Family History:  The patient's family history includes Anxiety disorder in her mother; Cancer in her mother; Dementia in her father; Heart disease in her father; Hyperlipidemia in her brother, brother, and father; Migraines in her mother; Other in her  brother.  ROS:  Please see the history of present illness.    All other systems are reviewed and otherwise negative.   PHYSICAL EXAM:  VS:  There were no vitals taken for this visit. BMI: There is no height or weight on file to calculate BMI. Well nourished, well developed, in no acute distress HEENT: normocephalic, atraumatic Neck: no JVD, carotid bruits or masses Cardiac: *** RRR; no significant murmurs, no rubs, or gallops Lungs:  *** CTA b/l, no wheezing, rhonchi or rales Abd: soft, nontender MS: no deformity or atrophy Ext: *** no edema Skin: warm and dry, no rash Neuro:  No gross deficits appreciated Psych: euthymic mood, full affect   EKG:  done today and reviewed by myself ***  Aug 2023: monitor Predominant rhythm was sinus rhythm Less than 1% ventricular and supraventricular ectopy No atrial fibrillation No triggered episodes  09/12/21: stress myoview  The patient reported dyspnea and nausea during the stress test. Normal blood pressure and normal heart rate response noted during stress.   Baseline EKG demonstrates NSR with Nonspecific ST abnormality   Slight accentuation of baseline of nonspecific ST abnormality The ECG was not diagnostic due to pharmacologic protocol.   LV perfusion is normal. There is no evidence of ischemia. There is no evidence of infarction.   Left ventricular function is normal. Nuclear stress EF: 63 %. The left ventricular ejection fraction is normal (55-65%). End diastolic cavity size is normal. End systolic cavity size is normal.   Prior study not available for comparison.   The study is normal. The study is low risk.  09/12/21: TTE  1. Left ventricular ejection fraction, by estimation, is 55 to 60%. The  left ventricle has normal function. The left ventricle has no regional  wall motion abnormalities. Left ventricular diastolic parameters are  consistent with Grade I diastolic  dysfunction (impaired relaxation). The average left ventricular  global  longitudinal strain is -24.2 %. The global longitudinal strain is normal.   2. Right ventricular systolic function is normal. The right ventricular  size is normal. There is normal pulmonary artery systolic pressure. The  estimated right ventricular systolic  pressure is 26.2 mmHg.   3. The mitral valve is abnormal. Trivial mitral valve regurgitation.   4. The aortic valve is tricuspid. Aortic valve regurgitation is not  visualized.   5. The inferior vena cava is normal in size with greater than 50%  respiratory variability, suggesting right atrial pressure of 3 mmHg.   Comparison(s): Prior images unable to be directly viewed, comparison made  by report only. No significant change from prior study. 12/05/2013: LVEF  55-60%, grade 1 DD.   12/16/2016: EPS/Ablation CONCLUSIONS: 1. Sinus rhythm upon presentation.   2. Intracardiac echo reveals a moderately enlared sized left atrium with four separate pulmonary veins without evidence of pulmonary vein stenosis. 3. Return of electrical activity within the left superior pulmonary vein.  The left inferior, right superior, and right inferior pulmonary veins were quiescent from a prior ablation procedure 4. As the prior ablation was an antral ablation procedure, I elected to perform WACA around all four pulmonary veins today.  Entrance and exit block was confirmed in all 4 PVs with adenosine  and isuprel  administered    5. Additional Cavo-tricuspid isthmus ablation was performed with complete bidirectional isthmus block achieved.  6. No inducible arrhythmias following ablation both on and off of Isuprel  7. No early apparent complications.   12/05/2013: TTE Study Conclusions  - Left ventricle: The cavity size was normal. Wall thickness was    normal. Systolic function was normal. The estimated ejection    fraction was in the range of 55% to 60%. Wall motion was normal;    there were no regional wall motion abnormalities. Doppler     parameters are consistent with abnormal left ventricular    relaxation (grade 1 diastolic dysfunction).     Recent Labs: 08/11/2023: ALT 10; BUN 12; Creatinine, Ser 0.80; Hemoglobin 13.8; Platelets 165.0; Potassium 3.9; Sodium 138; TSH 4.25  08/11/2023: Cholesterol 271; HDL 70.50; LDL Cholesterol 175; Total CHOL/HDL Ratio 4; Triglycerides 125.0; VLDL 25.0   CrCl cannot be calculated (Patient's most recent lab result is older than the maximum 21 days allowed.).   Wt Readings from Last 3 Encounters:  11/09/23 144 lb 9.6 oz (65.6 kg)  08/11/23 144 lb 12.8 oz (65.7 kg)  08/06/23 147 lb (66.7 kg)     Other studies reviewed: Additional studies/records reviewed today include: summarized above  ASSESSMENT AND PLAN:  Paroxysmal AFib CHA2DS3Vasc is 6 (age, gender) maintained on Eliquis , *** appropriately dosed *** No/low burden if any  2. CP/SOB *** not an ongoing c/o Did not appear to have a cardiac etiology Atypical Stress/echo looked good  3. HLD Her PMD is managing Gets HA from crestor  (taking it 2d/week), discussed trying alternatives/referral to our lipid clinic, she will continue tow work with her PMD    Disposition: back in ***, sooner if needed   Current medicines are reviewed at length with the patient today.  The patient did not have any concerns regarding medicines.  Bonney Charlies Arthur, PA-C 12/08/2023 12:34 PM     [facility] [address] [place] [address] [phone] (office)  [phone] (fax)

## 2023-12-09 ENCOUNTER — Ambulatory Visit: Attending: Physician Assistant | Admitting: Physician Assistant

## 2023-12-09 ENCOUNTER — Other Ambulatory Visit (HOSPITAL_COMMUNITY): Payer: Self-pay

## 2023-12-09 VITALS — BP 161/81 | HR 57 | Ht 66.0 in | Wt 145.0 lb

## 2023-12-09 DIAGNOSIS — I480 Paroxysmal atrial fibrillation: Secondary | ICD-10-CM | POA: Diagnosis not present

## 2023-12-09 DIAGNOSIS — R0789 Other chest pain: Secondary | ICD-10-CM | POA: Diagnosis not present

## 2023-12-09 MED ORDER — APIXABAN 5 MG PO TABS
5.0000 mg | ORAL_TABLET | Freq: Two times a day (BID) | ORAL | 3 refills | Status: AC
  Filled 2023-12-09: qty 180, 90d supply, fill #0

## 2023-12-09 NOTE — Patient Instructions (Signed)
 Medication Instructions:   Your physician recommends that you continue on your current medications as directed. Please refer to the Current Medication list given to you today.  *If you need a refill on your cardiac medications before your next appointment, please call your pharmacy*  Lab Work: NONE ORDERED  TODAY   If you have labs (blood work) drawn today and your tests are completely normal, you will receive your results only by: MyChart Message (if you have MyChart) OR A paper copy in the mail If you have any lab test that is abnormal or we need to change your treatment, we will call you to review the results.  Testing/Procedures: NONE ORDERED  TODAY    Follow-Up: At [facility] you and your health needs are our priority.  As part of our continuing mission to provide you with exceptional heart care, our providers are all part of one team.  This team includes your primary Cardiologist (physician) and Advanced Practice Providers or APPs (Physician Assistants and Nurse Practitioners) who all work together to provide you with the care you need, when you need it.  Your next appointment:   1 year(s)    Provider:   Agatha Horsfall, MD    We recommend signing up for the patient portal called "MyChart".  Sign up information is provided on this After Visit Summary.  MyChart is used to connect with patients for Virtual Visits (Telemedicine).  Patients are able to view lab/test results, encounter notes, upcoming appointments, etc.  Non-urgent messages can be sent to your provider as well.   To learn more about what you can do with MyChart, go to [email or web address].   Other Instructions

## 2023-12-10 DIAGNOSIS — F411 Generalized anxiety disorder: Secondary | ICD-10-CM | POA: Diagnosis not present

## 2023-12-10 DIAGNOSIS — F331 Major depressive disorder, recurrent, moderate: Secondary | ICD-10-CM | POA: Diagnosis not present

## 2023-12-11 ENCOUNTER — Other Ambulatory Visit: Payer: Self-pay

## 2023-12-11 ENCOUNTER — Other Ambulatory Visit (HOSPITAL_COMMUNITY): Payer: Self-pay

## 2023-12-14 ENCOUNTER — Telehealth: Payer: Self-pay | Admitting: Physician Assistant

## 2023-12-14 NOTE — Telephone Encounter (Signed)
 Called pt per Suzanne [name] to schedule for Evenity Injection. Per Suzanne [name] put pt for 15 min on Monday or Thursday.  When pt calls make please make appt

## 2023-12-15 ENCOUNTER — Other Ambulatory Visit (HOSPITAL_COMMUNITY): Payer: Self-pay

## 2023-12-15 MED ORDER — BUPROPION HCL ER (XL) 300 MG PO TB24
300.0000 mg | ORAL_TABLET | Freq: Every day | ORAL | 0 refills | Status: AC
  Filled 2024-03-09: qty 90, 90d supply, fill #0

## 2023-12-15 MED ORDER — ALPRAZOLAM 0.5 MG PO TABS
0.5000 mg | ORAL_TABLET | Freq: Two times a day (BID) | ORAL | 2 refills | Status: AC | PRN
  Filled 2023-12-15 – 2023-12-28 (×2): qty 60, 30d supply, fill #0
  Filled 2024-03-09: qty 60, 30d supply, fill #1

## 2023-12-15 MED ORDER — SERTRALINE HCL 100 MG PO TABS
100.0000 mg | ORAL_TABLET | Freq: Every day | ORAL | 0 refills | Status: DC
  Filled 2023-12-15: qty 90, 90d supply, fill #0

## 2023-12-18 ENCOUNTER — Ambulatory Visit: Admitting: Physician Assistant

## 2023-12-21 ENCOUNTER — Other Ambulatory Visit: Payer: Self-pay | Admitting: Radiology

## 2023-12-21 DIAGNOSIS — M810 Age-related osteoporosis without current pathological fracture: Secondary | ICD-10-CM

## 2023-12-21 MED ORDER — ROMOSOZUMAB-AQQG 105 MG/1.17ML ~~LOC~~ SOSY: 210.0000 mg | PREFILLED_SYRINGE | Freq: Once | SUBCUTANEOUS | Status: AC

## 2023-12-28 ENCOUNTER — Other Ambulatory Visit: Payer: Self-pay

## 2023-12-28 ENCOUNTER — Encounter: Payer: Self-pay | Admitting: Neurology

## 2024-01-13 ENCOUNTER — Other Ambulatory Visit: Payer: Self-pay | Admitting: Neurology

## 2024-01-13 ENCOUNTER — Other Ambulatory Visit (HOSPITAL_COMMUNITY): Payer: Self-pay

## 2024-01-13 MED ORDER — BUTALBITAL-APAP-CAFFEINE 50-325-40 MG PO TABS
1.0000 | ORAL_TABLET | Freq: Four times a day (QID) | ORAL | 0 refills | Status: AC | PRN
  Filled 2024-01-13: qty 30, 90d supply, fill #0
  Filled 2024-01-18: qty 30, 8d supply, fill #0

## 2024-01-13 NOTE — Telephone Encounter (Signed)
 Last seen on 07/09/23 Follow up scheduled on 07/14/24   Dispensed Days Supply Quantity Provider Pharmacy  butalbital -acetaminophen -caffeine  (FIORICET ) 50-325-40 MG tablet 10/14/2023 8 30 tablet Gayland Lauraine PARAS, NP [facility]...     Rx pending

## 2024-01-14 ENCOUNTER — Other Ambulatory Visit: Payer: Self-pay

## 2024-01-18 ENCOUNTER — Telehealth (HOSPITAL_COMMUNITY): Payer: Self-pay

## 2024-01-18 ENCOUNTER — Other Ambulatory Visit (HOSPITAL_BASED_OUTPATIENT_CLINIC_OR_DEPARTMENT_OTHER): Payer: Self-pay

## 2024-01-18 ENCOUNTER — Other Ambulatory Visit: Payer: Self-pay

## 2024-01-18 ENCOUNTER — Other Ambulatory Visit (HOSPITAL_COMMUNITY): Payer: Self-pay

## 2024-01-19 ENCOUNTER — Other Ambulatory Visit (HOSPITAL_COMMUNITY): Payer: Self-pay

## 2024-01-19 ENCOUNTER — Other Ambulatory Visit (HOSPITAL_BASED_OUTPATIENT_CLINIC_OR_DEPARTMENT_OTHER): Payer: Self-pay

## 2024-01-19 ENCOUNTER — Telehealth: Payer: Self-pay

## 2024-01-19 NOTE — Telephone Encounter (Signed)
 PA approved-please fill RX-Thanks!

## 2024-01-19 NOTE — Telephone Encounter (Signed)
 Pharmacy Patient Advocate Encounter   Received notification from [facility] that prior authorization for Nurtec is required/requested.   Insurance verification completed.   The patient is insured through [employer].   Per test claim: PA required; PA submitted to above mentioned insurance via Latent Key/confirmation #/EOC [facility] Status is pending

## 2024-01-19 NOTE — Telephone Encounter (Signed)
 Pharmacy Patient Advocate Encounter  Received notification from [employer] that Prior Authorization for Nur[employer] has been APPROVED from 01/19/2024 to 02/02/2025. Ran test claim, Copay is $0. This test claim was processed through [facility]- copay amounts may vary at other pharmacies due to pharmacy/plan contracts, or as the patient moves through the different stages of their insurance plan.   PA #/Case ID/Reference #: [id number]

## 2024-01-19 NOTE — Telephone Encounter (Signed)
 PA request has been Submitted. New Encounter has been or will be created for follow up. For additional info see Pharmacy Prior Auth telephone encounter from 01-19-2024.

## 2024-01-25 ENCOUNTER — Encounter: Payer: Self-pay | Admitting: Radiology

## 2024-03-01 ENCOUNTER — Encounter: Payer: Self-pay | Admitting: Acute Care

## 2024-03-04 ENCOUNTER — Ambulatory Visit
Admission: RE | Admit: 2024-03-04 | Discharge: 2024-03-04 | Disposition: A | Source: Ambulatory Visit | Attending: Acute Care | Admitting: Acute Care

## 2024-03-04 DIAGNOSIS — Z87891 Personal history of nicotine dependence: Secondary | ICD-10-CM

## 2024-03-04 DIAGNOSIS — Z122 Encounter for screening for malignant neoplasm of respiratory organs: Secondary | ICD-10-CM

## 2024-03-04 DIAGNOSIS — F17210 Nicotine dependence, cigarettes, uncomplicated: Secondary | ICD-10-CM

## 2024-03-09 ENCOUNTER — Other Ambulatory Visit: Payer: Self-pay

## 2024-03-09 ENCOUNTER — Other Ambulatory Visit (HOSPITAL_COMMUNITY): Payer: Self-pay

## 2024-03-09 DIAGNOSIS — F17210 Nicotine dependence, cigarettes, uncomplicated: Secondary | ICD-10-CM

## 2024-03-09 DIAGNOSIS — Z122 Encounter for screening for malignant neoplasm of respiratory organs: Secondary | ICD-10-CM

## 2024-03-09 DIAGNOSIS — Z87891 Personal history of nicotine dependence: Secondary | ICD-10-CM

## 2024-03-10 ENCOUNTER — Other Ambulatory Visit: Payer: Self-pay

## 2024-03-10 ENCOUNTER — Other Ambulatory Visit (HOSPITAL_COMMUNITY): Payer: Self-pay

## 2024-03-10 MED ORDER — ALPRAZOLAM 0.5 MG PO TABS
0.5000 mg | ORAL_TABLET | Freq: Two times a day (BID) | ORAL | 2 refills | Status: AC | PRN
  Filled 2024-03-10: qty 60, 30d supply, fill #0

## 2024-03-10 MED ORDER — BUPROPION HCL ER (XL) 300 MG PO TB24: 300.0000 mg | ORAL_TABLET | Freq: Every day | ORAL | 0 refills | Status: AC

## 2024-03-10 MED ORDER — BUPROPION HCL ER (XL) 300 MG PO TB24
300.0000 mg | ORAL_TABLET | Freq: Every day | ORAL | 1 refills | Status: AC
  Filled 2024-03-10: qty 90, 90d supply, fill #0

## 2024-03-10 MED ORDER — SERTRALINE HCL 100 MG PO TABS
100.0000 mg | ORAL_TABLET | Freq: Every day | ORAL | 1 refills | Status: AC
  Filled 2024-03-10: qty 90, 90d supply, fill #0

## 2024-03-11 ENCOUNTER — Other Ambulatory Visit: Payer: Self-pay

## 2024-03-11 ENCOUNTER — Other Ambulatory Visit (HOSPITAL_COMMUNITY): Payer: Self-pay

## 2024-07-14 ENCOUNTER — Ambulatory Visit: Admitting: Neurology

## 2024-08-10 ENCOUNTER — Ambulatory Visit

## 2024-08-11 ENCOUNTER — Encounter: Admitting: Family Medicine
